# Patient Record
Sex: Female | Born: 1935 | Race: White | Hispanic: No | Smoking: Never smoker
Health system: Southern US, Community
[De-identification: ages and names within clinical notes are randomized; demographics above are authoritative.]

## PROBLEM LIST (undated history)

## (undated) DIAGNOSIS — E785 Hyperlipidemia, unspecified: Secondary | ICD-10-CM

## (undated) DIAGNOSIS — R7989 Other specified abnormal findings of blood chemistry: Secondary | ICD-10-CM

## (undated) DIAGNOSIS — I509 Heart failure, unspecified: Secondary | ICD-10-CM

## (undated) DIAGNOSIS — H9190 Unspecified hearing loss, unspecified ear: Secondary | ICD-10-CM

## (undated) DIAGNOSIS — I872 Venous insufficiency (chronic) (peripheral): Secondary | ICD-10-CM

## (undated) DIAGNOSIS — S0003XA Contusion of scalp, initial encounter: Secondary | ICD-10-CM

## (undated) DIAGNOSIS — S1093XA Contusion of unspecified part of neck, initial encounter: Secondary | ICD-10-CM

## (undated) DIAGNOSIS — E039 Hypothyroidism, unspecified: Secondary | ICD-10-CM

## (undated) DIAGNOSIS — I10 Essential (primary) hypertension: Secondary | ICD-10-CM

## (undated) DIAGNOSIS — R350 Frequency of micturition: Secondary | ICD-10-CM

## (undated) DIAGNOSIS — J189 Pneumonia, unspecified organism: Secondary | ICD-10-CM

## (undated) DIAGNOSIS — K5900 Constipation, unspecified: Secondary | ICD-10-CM

## (undated) DIAGNOSIS — K8020 Calculus of gallbladder without cholecystitis without obstruction: Secondary | ICD-10-CM

## (undated) DIAGNOSIS — S0083XA Contusion of other part of head, initial encounter: Secondary | ICD-10-CM

## (undated) DIAGNOSIS — Z9181 History of falling: Secondary | ICD-10-CM

## (undated) DIAGNOSIS — S3210XA Unspecified fracture of sacrum, initial encounter for closed fracture: Secondary | ICD-10-CM

## (undated) DIAGNOSIS — M533 Sacrococcygeal disorders, not elsewhere classified: Secondary | ICD-10-CM

## (undated) DIAGNOSIS — S81809A Unspecified open wound, unspecified lower leg, initial encounter: Secondary | ICD-10-CM

## (undated) DIAGNOSIS — J302 Other seasonal allergic rhinitis: Secondary | ICD-10-CM

## (undated) DIAGNOSIS — Z8673 Personal history of transient ischemic attack (TIA), and cerebral infarction without residual deficits: Secondary | ICD-10-CM

## (undated) DIAGNOSIS — J449 Chronic obstructive pulmonary disease, unspecified: Secondary | ICD-10-CM

## (undated) DIAGNOSIS — R739 Hyperglycemia, unspecified: Secondary | ICD-10-CM

## (undated) DIAGNOSIS — I00 Rheumatic fever without heart involvement: Secondary | ICD-10-CM

## (undated) DIAGNOSIS — M2550 Pain in unspecified joint: Secondary | ICD-10-CM

## (undated) DIAGNOSIS — N179 Acute kidney failure, unspecified: Secondary | ICD-10-CM

## (undated) DIAGNOSIS — R609 Edema, unspecified: Secondary | ICD-10-CM

## (undated) DIAGNOSIS — M1990 Unspecified osteoarthritis, unspecified site: Secondary | ICD-10-CM

## (undated) DIAGNOSIS — M25579 Pain in unspecified ankle and joints of unspecified foot: Secondary | ICD-10-CM

## (undated) DIAGNOSIS — G609 Hereditary and idiopathic neuropathy, unspecified: Secondary | ICD-10-CM

## (undated) DIAGNOSIS — S91009A Unspecified open wound, unspecified ankle, initial encounter: Secondary | ICD-10-CM

## (undated) DIAGNOSIS — I739 Peripheral vascular disease, unspecified: Secondary | ICD-10-CM

## (undated) DIAGNOSIS — S322XXA Fracture of coccyx, initial encounter for closed fracture: Secondary | ICD-10-CM

## (undated) DIAGNOSIS — S60219A Contusion of unspecified wrist, initial encounter: Secondary | ICD-10-CM

## (undated) DIAGNOSIS — I4901 Ventricular fibrillation: Secondary | ICD-10-CM

## (undated) DIAGNOSIS — I669 Occlusion and stenosis of unspecified cerebral artery: Secondary | ICD-10-CM

## (undated) DIAGNOSIS — Z8542 Personal history of malignant neoplasm of other parts of uterus: Secondary | ICD-10-CM

## (undated) DIAGNOSIS — R945 Abnormal results of liver function studies: Secondary | ICD-10-CM

## (undated) DIAGNOSIS — S81009A Unspecified open wound, unspecified knee, initial encounter: Secondary | ICD-10-CM

## (undated) DIAGNOSIS — G4700 Insomnia, unspecified: Secondary | ICD-10-CM

## (undated) DIAGNOSIS — B3749 Other urogenital candidiasis: Secondary | ICD-10-CM

## (undated) HISTORY — DX: Contusion of unspecified part of neck, initial encounter: S10.93XA

## (undated) HISTORY — DX: Other urogenital candidiasis: B37.49

## (undated) HISTORY — DX: Contusion of scalp, initial encounter: S00.03XA

## (undated) HISTORY — DX: Unspecified open wound, unspecified knee, initial encounter: S81.009A

## (undated) HISTORY — DX: Unspecified hearing loss, unspecified ear: H91.90

## (undated) HISTORY — DX: Unspecified fracture of sacrum, initial encounter for closed fracture: S32.10XA

## (undated) HISTORY — DX: Frequency of micturition: R35.0

## (undated) HISTORY — DX: Hereditary and idiopathic neuropathy, unspecified: G60.9

## (undated) HISTORY — DX: Contusion of unspecified wrist, initial encounter: S60.219A

## (undated) HISTORY — DX: Pain in unspecified joint: M25.50

## (undated) HISTORY — DX: Hyperglycemia, unspecified: R73.9

## (undated) HISTORY — DX: Occlusion and stenosis of unspecified cerebral artery: I66.9

## (undated) HISTORY — DX: Pain in unspecified ankle and joints of unspecified foot: M25.579

## (undated) HISTORY — DX: Calculus of gallbladder without cholecystitis without obstruction: K80.20

## (undated) HISTORY — DX: Rheumatic fever without heart involvement: I00

## (undated) HISTORY — DX: Hyperlipidemia, unspecified: E78.5

## (undated) HISTORY — DX: Edema, unspecified: R60.9

## (undated) HISTORY — DX: Other specified abnormal findings of blood chemistry: R79.89

## (undated) HISTORY — DX: Insomnia, unspecified: G47.00

## (undated) HISTORY — DX: Other seasonal allergic rhinitis: J30.2

## (undated) HISTORY — PX: CATARACT EXTRACTION W/ INTRAOCULAR LENS  IMPLANT, BILATERAL: SHX1307

## (undated) HISTORY — DX: Unspecified open wound, unspecified lower leg, initial encounter: S81.809A

## (undated) HISTORY — DX: Unspecified open wound, unspecified ankle, initial encounter: S91.009A

## (undated) HISTORY — DX: History of falling: Z91.81

## (undated) HISTORY — DX: Ventricular fibrillation: I49.01

## (undated) HISTORY — DX: Contusion of other part of head, initial encounter: S00.83XA

## (undated) HISTORY — DX: Peripheral vascular disease, unspecified: I73.9

## (undated) HISTORY — DX: Abnormal results of liver function studies: R94.5

## (undated) HISTORY — DX: Constipation, unspecified: K59.00

## (undated) HISTORY — DX: Acute kidney failure, unspecified: N17.9

## (undated) HISTORY — DX: Sacrococcygeal disorders, not elsewhere classified: M53.3

## (undated) HISTORY — DX: Venous insufficiency (chronic) (peripheral): I87.2

## (undated) HISTORY — DX: Personal history of malignant neoplasm of other parts of uterus: Z85.42

## (undated) HISTORY — DX: Fracture of coccyx, initial encounter for closed fracture: S32.2XXA

## (undated) HISTORY — DX: Pneumonia, unspecified organism: J18.9

## (undated) HISTORY — DX: Chronic obstructive pulmonary disease, unspecified: J44.9

---
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
## 1958-12-13 HISTORY — PX: THYROIDECTOMY: SHX17

## 1988-12-13 HISTORY — PX: ABDOMINAL HYSTERECTOMY: SHX81

## 1999-03-18 ENCOUNTER — Ambulatory Visit (HOSPITAL_COMMUNITY): Admission: RE | Admit: 1999-03-18 | Discharge: 1999-03-18 | Payer: Self-pay

## 2000-04-27 ENCOUNTER — Encounter: Payer: Self-pay | Admitting: Family Medicine

## 2000-04-27 ENCOUNTER — Ambulatory Visit (HOSPITAL_COMMUNITY): Admission: RE | Admit: 2000-04-27 | Discharge: 2000-04-27 | Payer: Self-pay | Admitting: Family Medicine

## 2000-12-16 ENCOUNTER — Ambulatory Visit (HOSPITAL_COMMUNITY): Admission: RE | Admit: 2000-12-16 | Discharge: 2000-12-16 | Payer: Self-pay | Admitting: Gastroenterology

## 2001-02-07 ENCOUNTER — Other Ambulatory Visit: Admission: RE | Admit: 2001-02-07 | Discharge: 2001-02-07 | Payer: Self-pay | Admitting: Family Medicine

## 2001-07-27 ENCOUNTER — Ambulatory Visit (HOSPITAL_COMMUNITY): Admission: RE | Admit: 2001-07-27 | Discharge: 2001-07-27 | Payer: Self-pay | Admitting: Family Medicine

## 2001-07-27 ENCOUNTER — Encounter: Payer: Self-pay | Admitting: Family Medicine

## 2002-02-27 ENCOUNTER — Other Ambulatory Visit: Admission: RE | Admit: 2002-02-27 | Discharge: 2002-02-27 | Payer: Self-pay | Admitting: Family Medicine

## 2002-03-01 ENCOUNTER — Encounter: Payer: Self-pay | Admitting: Family Medicine

## 2002-03-01 ENCOUNTER — Encounter: Admission: RE | Admit: 2002-03-01 | Discharge: 2002-03-01 | Payer: Self-pay | Admitting: Family Medicine

## 2002-07-30 ENCOUNTER — Ambulatory Visit (HOSPITAL_COMMUNITY): Admission: RE | Admit: 2002-07-30 | Discharge: 2002-07-30 | Payer: Self-pay | Admitting: Family Medicine

## 2002-07-30 ENCOUNTER — Encounter: Payer: Self-pay | Admitting: Family Medicine

## 2003-05-21 ENCOUNTER — Other Ambulatory Visit: Admission: RE | Admit: 2003-05-21 | Discharge: 2003-05-21 | Payer: Self-pay | Admitting: Family Medicine

## 2003-08-01 ENCOUNTER — Encounter: Payer: Self-pay | Admitting: Family Medicine

## 2003-08-01 ENCOUNTER — Ambulatory Visit (HOSPITAL_COMMUNITY): Admission: RE | Admit: 2003-08-01 | Discharge: 2003-08-01 | Payer: Self-pay | Admitting: Family Medicine

## 2004-04-23 ENCOUNTER — Other Ambulatory Visit: Admission: RE | Admit: 2004-04-23 | Discharge: 2004-04-23 | Payer: Self-pay | Admitting: Family Medicine

## 2004-04-27 ENCOUNTER — Ambulatory Visit (HOSPITAL_COMMUNITY): Admission: RE | Admit: 2004-04-27 | Discharge: 2004-04-27 | Payer: Self-pay | Admitting: Obstetrics and Gynecology

## 2004-05-19 ENCOUNTER — Ambulatory Visit: Admission: RE | Admit: 2004-05-19 | Discharge: 2004-05-19 | Payer: Self-pay | Admitting: Gynecology

## 2004-06-23 ENCOUNTER — Encounter (INDEPENDENT_AMBULATORY_CARE_PROVIDER_SITE_OTHER): Payer: Self-pay | Admitting: Specialist

## 2004-06-23 ENCOUNTER — Encounter (INDEPENDENT_AMBULATORY_CARE_PROVIDER_SITE_OTHER): Payer: Self-pay | Admitting: *Deleted

## 2004-06-23 ENCOUNTER — Inpatient Hospital Stay (HOSPITAL_COMMUNITY): Admission: EM | Admit: 2004-06-23 | Discharge: 2004-06-25 | Payer: Self-pay | Admitting: Obstetrics and Gynecology

## 2004-07-29 ENCOUNTER — Ambulatory Visit: Admission: RE | Admit: 2004-07-29 | Discharge: 2004-07-29 | Payer: Self-pay | Admitting: Gynecology

## 2004-08-07 ENCOUNTER — Ambulatory Visit (HOSPITAL_COMMUNITY): Admission: RE | Admit: 2004-08-07 | Discharge: 2004-08-07 | Payer: Self-pay | Admitting: Gynecology

## 2004-11-18 ENCOUNTER — Other Ambulatory Visit: Admission: RE | Admit: 2004-11-18 | Discharge: 2004-11-18 | Payer: Self-pay | Admitting: *Deleted

## 2005-01-23 DIAGNOSIS — J189 Pneumonia, unspecified organism: Secondary | ICD-10-CM

## 2005-01-23 HISTORY — DX: Pneumonia, unspecified organism: J18.9

## 2005-03-24 ENCOUNTER — Other Ambulatory Visit: Admission: RE | Admit: 2005-03-24 | Discharge: 2005-03-24 | Payer: Self-pay | Admitting: *Deleted

## 2005-05-23 DIAGNOSIS — I669 Occlusion and stenosis of unspecified cerebral artery: Secondary | ICD-10-CM

## 2005-05-23 HISTORY — DX: Occlusion and stenosis of unspecified cerebral artery: I66.9

## 2005-06-04 ENCOUNTER — Inpatient Hospital Stay (HOSPITAL_COMMUNITY): Admission: EM | Admit: 2005-06-04 | Discharge: 2005-06-08 | Payer: Self-pay | Admitting: Emergency Medicine

## 2005-06-07 ENCOUNTER — Encounter (INDEPENDENT_AMBULATORY_CARE_PROVIDER_SITE_OTHER): Payer: Self-pay | Admitting: *Deleted

## 2005-07-27 ENCOUNTER — Other Ambulatory Visit: Admission: RE | Admit: 2005-07-27 | Discharge: 2005-07-27 | Payer: Self-pay | Admitting: *Deleted

## 2005-08-06 ENCOUNTER — Ambulatory Visit (HOSPITAL_COMMUNITY): Admission: RE | Admit: 2005-08-06 | Discharge: 2005-08-06 | Payer: Self-pay | Admitting: *Deleted

## 2005-12-02 ENCOUNTER — Other Ambulatory Visit: Admission: RE | Admit: 2005-12-02 | Discharge: 2005-12-02 | Payer: Self-pay | Admitting: Obstetrics and Gynecology

## 2006-06-03 ENCOUNTER — Other Ambulatory Visit: Admission: RE | Admit: 2006-06-03 | Discharge: 2006-06-03 | Payer: Self-pay | Admitting: Obstetrics & Gynecology

## 2006-08-09 ENCOUNTER — Ambulatory Visit (HOSPITAL_COMMUNITY): Admission: RE | Admit: 2006-08-09 | Discharge: 2006-08-09 | Payer: Self-pay | Admitting: *Deleted

## 2006-11-24 ENCOUNTER — Other Ambulatory Visit: Admission: RE | Admit: 2006-11-24 | Discharge: 2006-11-24 | Payer: Self-pay | Admitting: Obstetrics & Gynecology

## 2007-01-19 ENCOUNTER — Inpatient Hospital Stay (HOSPITAL_COMMUNITY): Admission: EM | Admit: 2007-01-19 | Discharge: 2007-01-24 | Payer: Self-pay | Admitting: Emergency Medicine

## 2007-01-27 ENCOUNTER — Inpatient Hospital Stay (HOSPITAL_COMMUNITY): Admission: EM | Admit: 2007-01-27 | Discharge: 2007-02-03 | Payer: Self-pay | Admitting: Emergency Medicine

## 2007-01-30 ENCOUNTER — Encounter: Payer: Self-pay | Admitting: Cardiology

## 2007-07-11 ENCOUNTER — Other Ambulatory Visit: Admission: RE | Admit: 2007-07-11 | Discharge: 2007-07-11 | Payer: Self-pay | Admitting: Obstetrics & Gynecology

## 2007-08-21 ENCOUNTER — Ambulatory Visit (HOSPITAL_COMMUNITY): Admission: RE | Admit: 2007-08-21 | Discharge: 2007-08-21 | Payer: Self-pay | Admitting: Family Medicine

## 2008-02-27 ENCOUNTER — Other Ambulatory Visit: Admission: RE | Admit: 2008-02-27 | Discharge: 2008-02-27 | Payer: Self-pay | Admitting: Obstetrics and Gynecology

## 2008-08-23 ENCOUNTER — Ambulatory Visit (HOSPITAL_COMMUNITY): Admission: RE | Admit: 2008-08-23 | Discharge: 2008-08-23 | Payer: Self-pay | Admitting: Family Medicine

## 2008-09-05 ENCOUNTER — Other Ambulatory Visit: Admission: RE | Admit: 2008-09-05 | Discharge: 2008-09-05 | Payer: Self-pay | Admitting: Obstetrics & Gynecology

## 2009-09-19 ENCOUNTER — Ambulatory Visit (HOSPITAL_COMMUNITY): Admission: RE | Admit: 2009-09-19 | Discharge: 2009-09-19 | Payer: Self-pay | Admitting: Family Medicine

## 2010-01-06 ENCOUNTER — Encounter: Admission: RE | Admit: 2010-01-06 | Discharge: 2010-02-13 | Payer: Self-pay | Admitting: Family Medicine

## 2011-04-30 NOTE — Discharge Summary (Signed)
[name], [name]             ACCOUNT NO.:  [id number]   MEDICAL RECORD NO.:  [id number]          PATIENT TYPE:  INP   LOCATION:  1426                         FACILITY:  [facility]   PHYSICIAN:  Kela Millin, M.D.DATE OF BIRTH:  1910-12-15   DATE OF ADMISSION:  06/04/2005  DATE OF DISCHARGE:                                 DISCHARGE SUMMARY   DISCHARGE DIAGNOSES:  1.  Embolic stroke, multifocal.  2.  Hypertension.  3.  Hypercholesterolemia.  4.  History of endometrial carcinoma, stage I, status post hysterectomy.  5.  Hypothyroidism.   STUDIES:  1.  CT scan of the head:  No acute intracranial abnormalities.  2.  MRI of the brain:  Multiple small bilateral acute embolic infarcts.      Negative for metastatic disease.   CONSULTATIONS:  Neurology:  Dr. Vickey Huger.   HISTORY:  The patient is a 77 year old white female resident of [facility], who presented status post a fall.  The patient reported  that she had been in the shower and fell.  She was unable to give details of  what happened.  She denied losing consciousness or having any presyncopal-  type episodes.  The patient's granddaughter reported that the patient had  complaints of dizziness for about two days prior to presentation and also  had more difficulty seeing while reading, which had gradually increased.  The patient was noted to be confused after the fall.  She reported a  headache but stated at the time she was seen that it had improved.   PHYSICAL EXAMINATION:  VITAL SIGNS:  Upon admission per Dr. Jomarie Longs,  temperature 96.8, blood pressure 222/118 with a pulse of 84.  Respiratory  rate 20.  O2 sat 98%.  Her repeat blood pressure was 126/66.  HEENT:  She had ecchymosis around her left eye and a small laceration above  the left eye as well, which had been sutured up in the ER.  Mucous membranes  were moist.  The rest of the patient's physical exam was noted to be within  normal limits.   On the  laboratory data, the patient had a white cell count of 10 with a  hemoglobin of 14, hematocrit 43, platelet count 211.  Sodium 137, potassium  3.6, chloride 101, bicarb 26, glucose 102, BUN 23, creatinine 1, calcium 9.  Urinalysis was negative for infection.  Chronic enzymes were negative.   HOSPITAL COURSE:  Problem 1:  Embolic stroke, multifocal:  Upon admission,  the patient had a CT scan of the head which showed no acute intracranial  abnormalities, as noted above.  She had a follow-up MRI, and it showed  multiple small bilateral acute infarcts, as already stated above.  Following  these findings, a 2D echo was ordered as well as carotid Doppler ultrasound.  The carotid Doppler ultrasound showed no ICA stenosis bilaterally.  The 2D  echo results are pending at the time of this dictation.  Neurology was  consulted, and Dr. Vickey Huger saw the patient.  She was started on Aggrenox 1  tablet p.o. b.i.d.  Physical therapy was also consulted  while she was in the  hospital.  Home health physical therapy was recommended for safety  evaluation.  The patient has remained hemodynamically stable throughout her  hospital stay.  She is to follow up with her primary care physician, Dr.  Henrine Screws, and with neurology as needed.  Problem 2:  Hypertension:  The patient's initial blood pressure in the  emergency room was noted to be markedly elevated, 222/118, but subsequently  on recheck, it was 126/66.  During the hospital stay, her blood pressures  were closely monitored, and were noted to be persistently elevated.  So, the  patient was started on lisinopril for blood pressure control.  Problem 3:  Hyperthyroidism:  The patient was maintained on Synthroid during  her hospital stay.  Problem 4:  Hypercholesterolemia:  The patient was maintained on her  antilipid medications during her hospital stay.   DISCHARGE MEDICATIONS:  1.  Aggrenox 1 p.o. b.i.d.  2.  Lisinopril 10 mg 1 p.o. daily.  3.   Patient is to continue preadmission medications.   FOLLOW-UP CARE:  1.  Dr. Henrine Screws in one week.  2.  Neurology as needed.   DISCHARGE CONDITION:  Improved, stable.       ACV/MEDQ  D:  06/07/2005  T:  06/07/2005  Job:  [id number]   cc:   Chales Salmon. Abigail Miyamoto, M.D.  [address]  [place]  [place] [address]  Fax: [phone]   Melvyn Novas, M.D.  [address]  [address]  [place]  [place] [address]  Fax: [phone]

## 2011-04-30 NOTE — H&P (Signed)
NAMEWLADYSLAWA, DISBRO             ACCOUNT NO.:  [id number]   MEDICAL RECORD NO.:  [id number]          PATIENT TYPE:  INP   LOCATION:  0101                         FACILITY:  [facility]   PHYSICIAN:  Theone Stanley, MD   DATE OF BIRTH:  08-18-1911   DATE OF ADMISSION:  06/04/2005  DATE OF DISCHARGE:                                HISTORY & PHYSICAL   CHIEF COMPLAINT:  Fall.   HISTORY OF PRESENT ILLNESS:  Mrs. Hands is a 77 year old female who lives  in [facility] who was in the shower and fell.  She is unable  to give me the details of it; however, she denies losing consciousness or  having any presyncopal-type episodes.  Her granddaughter did note that she  has had complaints of dizziness over the past 2 days and a day ago mentioned  somewhat difficulty seeing while she was reading which has been a gradual  increase.  It appears patient is currently confused after fall.  She does  have a headache but is better than her initial presentation.   PAST MEDICAL HISTORY:  Hypothyroidism and history of uterine CA,  hypercholesterolemia.   MEDICATIONS:  Aspirin 81 mg p.o. once daily, Provera 10 mg one p.o. b.i.d.,  Lovastatin 20 mg one p.o. at bedtime, Synthroid 88 mcg one p.o. once daily.   ALLERGIES:  None.   SURGERY:  Hysterectomy.   FAMILY HISTORY:  Noncontributory to current patient situation.   SOCIAL HISTORY:  Patient lives in [place] in [facility]  with her husband.  No tobacco, alcohol or illicit drug use.   REVIEW OF SYSTEMS:  Please see HPI otherwise systems were negative.   PHYSICAL EXAMINATION:  VITAL SIGNS:  Temperature of 96.8, blood pressure  222/118, pulse of 84, respiratory rate of 20, saturating 97% on room air.  Repeat blood pressure was 126/66.  HEENT:  There is evidence of ecchymosis around her left eye and she had a  small laceration above her left eye which was sutured up by the ER  physician.  There is no previous  bleeding.  Pupils are equal, reactive,  extraocular movements intact.  Ears no discharge.  Throat clear.  No  erythema noted or exudate.  Mucosa moist.  NECK:  Supple.  No lymphadenopathy, no JVD.  HEART:  Regular rate and rhythm.  No murmurs, rubs or gallops heard.  LUNGS:  Clear to auscultation bilaterally.  ABDOMEN:  Soft, nontender, nondistended.  EXTREMITIES:  No edema, cyanosis or clubbing.   LABORATORIES/RADIOLOGY:  Patient had a CT of the head and spine which did  not show anything except changes consistent with her age.  White count of  10, hemoglobin 14, hematocrit 43, platelets at 211.  Sodium of 137,  potassium of 3.6, chloride at 101, bicarb of 26, glucose at 102, BUN at 23,  creatinine at 1, calcium at 9.  UA was negative.  Set of cardiac enzymes  were negative.   ASSESSMENT AND PLAN:  [age]-year-old female with minimal medical  problems presenting with a fall.   Problem 1. FALL.  This is most likely secondary to  mechanical; however,  patient remains confused.  In addition she apparently was having some  strange behavior prior to this incident.  CT scan was negative.  I will  obtain an MRI and admit her for observation for a 23-hour stay to observe  her.  If patient improves over the next 23 hours, she can be discharged at  that point in time.  Problem 2. HISTORY OF HYPOTHYROIDISM.  Patient indicated that she was taking  her Synthroid twice a day now, this could be secondary to post-trauma  confusion; however, I will check a TSH.  Problem 3. LOVASTATIN.  We will continue this.  Problem 4. HISTORY OF ENDOMETRIAL CARCINOMA STAGE I STATUS POST  HYSTERECTOMY.  I will continue on her Provera.       AEJ/MEDQ  D:  06/04/2005  T:  06/04/2005  Job:  [id number]   cc:   Chales Salmon. Abigail Miyamoto, M.D.  [address]  [place]  [place] [address]  Fax: [phone]

## 2011-04-30 NOTE — Consult Note (Signed)
NAMEBURNIE, HANK NO.:  [id number]   MEDICAL RECORD NO.:  [id number]          PATIENT TYPE:  INP   LOCATION:  1426                         [facility]AC[facility]   PHYSICIAN:  Melvyn Novas, M.D.  DATE OF BIRTH:  Nov 07, 1911   DATE OF CONSULTATION:  DATE OF DISCHARGE:                                   CONSULTATION   [name] is a 77 year old patient of Dr. Jasmine Pang of the [facility] team. She was born on 1911-12-09. The patient was admitted  on June 04, 2005 after a fall. She lives at [name] and is a 89-year-  old pleasantly confused Caucasian widowed female, a retired Runner, broadcasting/film/video states  that she has no idea what happened at the time of the fall. She has no  recollection of it but she does state that she has felt confused since then.  According to the hospitalist admission note an H&P, the had already acted  different from her normal self for days prior to the fall.   The patient has a history of osteoporosis, hypothyroidism, rheumatic fever  as a child. 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 She has also a history of endometrial carcinoma.   MEDICATIONS:  See the H&P of Dr. Jomarie Longs. It mentions aspirin, Provera,  lovastatin and Synthroid.   CONCERN:  This patient fell and was confused for over 48 hours since. An MRI  was obtained and showed no subdural hemorrhage or concussion related  injuries but multifocal small most likely embolic strokes in both  hemispheres of the brain. I could detect on defusion weighted image five  lesions on the left and three on the right. The embolic origin is truly the  most likely given this distribution.   MENTAL STATUS:  The patient is alert, she has shortterm memory impairment  but fluent speech and repetition. She can  follow multistep commands. As we  discussed, the possibility that her strokes originate from a heart valve  condition. She told me of her history of rheumatic fever. She is well able  to combine medical information and appears very well educated and fully  oriented to time and place.   CRANIAL NERVE EXAMINATION:  Shows the pupils to be reactive to light and  accommodation. The patient has a large area of bruising over the left eye  and temple. She has no resulting ptosis and no ciliary injections. There is  no papilledema noticed. She has no facial asymmetry for the lower branch and  she chose a normal tongue and uvula movement. Eye movements to the left of  the visual field seem to provoke a nystagmus and psychotic eye movements  that I do not see when she tries to look to the right.   MOTOR EXAMINATION:  Shows normal 5/5 grip strength, elbow flexion, shoulder  shrug, hip adduction, abduction, flexion and dorsiflexion and plantar  flexion of  the ankle. Down going toes and 1+ deep tendon reflexes  throughout.   SENSORY:  Intact to vibration, temperature and pinprick. The patient also  shows no tendency to extinct.   COORDINATION AND GAIT:  The patient could walk to the bathroom unassisted  according to her RN. She is weaker on the right leg but she does not drift.  She states that he right leg is bruised and that she has had trouble with it  before. She does not need an assistive device.   ASSESSMENT:  Embolic stroke multifocal. It is conceivable that one of these  embolic lesions might have caused the temporary loss of consciousness and  fall that was described by her nursing home.   PLAN:  I think a transthoracic echocardiogram should be sufficient for this  very slender lady. A transcranial Doppler could be contemplated. She should  resume aspirin at full strength. I do not know if she currently took 81 or  325 mg at home and I suggested that she be placed on DVT prophylaxis  while  hospitalized. I would also like to obtain a homocystine level and a  cholesterol fasting lipid profile and involve her in physical therapy. I do  not think that speech therapy or occupational therapy are necessary. Therapy  should mainly direct her to having better balance and gait stabilization.       CD/MEDQ  D:  06/06/2005  T:  06/07/2005  Job:  [id number]   cc:   Theone Stanley, MD   Chales Salmon. Abigail Miyamoto, M.D.  [address]  [place]  [place] [address]  Fax: [phone]   Pramod P. Pearlean Brownie, MD  Fax: [phone]

## 2011-04-30 NOTE — H&P (Signed)
[name], [name]             ACCOUNT NO.:  [id number]   MEDICAL RECORD NO.:  [id number]          PATIENT TYPE:  INP   LOCATION:  2631                         FACILITY:  [facility]   PHYSICIAN:  Kela Millin, M.D.DATE OF BIRTH:  03/30/1911   DATE OF ADMISSION:  01/27/2007  DATE OF DISCHARGE:                              HISTORY & PHYSICAL   Her primary care physician is [name]. [name], M.D.   CHIEF COMPLAINT:  Decreased p.o. intake and falls.   HISTORY OF PRESENT ILLNESS:  The patient is a 77 year old white female  recently discharged from the hospital following treatment for pneumonia  with hypotension.  Other past medical history significant for  hypertension, hyperlipidemia, hypothyroidism, history of embolic stroke  in June 2006, who presents with the above complaints.  The family is at  the bedside assisting with the history.  They report that the patient  has had decreased p.o. intake since she was discharged from the hospital  about 3 days ago.  They also report that the patient has been falling at  home.  EMS reports that the patient was seen at [facility] and blood pressure  found to be 84/82.  He was given a 500 mL bolus of fluids and the blood  pressures improved to a systolic of 130, and initial O2 saturations by  EMS 84%, that improved to 90% on nasal cannula O2.  The patient's  daughter also reports that the patient has had diarrhea although the  patient is unable to state about how often or further describe her  stools.  She denies nausea, vomiting, fevers, dysuria, cough, and no  chest pain.   In the ER the patient's blood pressure was noted to be 88/60.  Lab work  revealed a BUN of 66 with a creatinine of 3.8 and her white cell count  elevated at 13.4.  On her chest x-ray the AP film showed an obscured  left hemidiaphragm, question left lower lobe pneumonia, but this was not  seen on the lateral view of the x-ray.  The patient is admitted to the  [facility] service for further evaluation and management.   PAST MEDICAL HISTORY:  1. As stated above.  2. History of endometrial cancer.  3. Arthritis.  4. Hyponatremia, diagnosed during her recent hospital stay.   MEDICATIONS:  1. Aggrenox one p.o. b.i.d.  2. Avelox (she was to take this for 5 more days).  3. Lisinopril 10 mg daily.  4. Lovastatin 10 mg daily.  5. Medroxyprogesterone 10 mg b.i.d.  6. Mucinex.  7. Synthroid 75 mcg daily.  8. Tylenol p.r.n.  9. Albuterol two puffs q.4h. p.r.n.   ALLERGIES:  ERYTHROMYCIN.   SOCIAL HISTORY:  No tobacco and no alcohol.   FAMILY HISTORY:  Her brother had diabetes and niece with liver cancer.   REVIEW OF SYSTEMS:  As per HPI.   PHYSICAL EXAMINATION:  GENERAL:  The patient is an elderly white female.  She is somnolent but easily aroused and oriented x3, in no respiratory  distress.  VITAL SIGNS:  Her lowest blood pressure in the ER 82/50, current  blood  pressure following fluid bolus 90/50, pulse 85, respiratory rate 20, O2  saturation of 96%.  HEENT:  Dry mucous membranes, PERRL, EOMI, sclerae anicteric.  No oral  exudates.  NECK:  Supple, no adenopathy, no JVD, and no thyromegaly.  LUNGS:  Decreased breath sounds at the bases, no crackles and no  wheezes.  CARDIOVASCULAR:  A regular rate and rhythm, normal S1, S2, no gallops.  ABDOMEN:  Soft, bowel sounds present, nontender, nondistended, no  organomegaly, and no masses palpable.  EXTREMITIES:  No cyanosis and no edema.  Right wrist tenderness.  NEUROLOGIC:  Somnolent, easily arousable, oriented x3.  Cranial nerves  II-XII grossly intact.  Nonfocal exam.  SKIN:  Macular erythematous rash on upper extremities and trunk.   LABORATORY DATA:  CT scan of head:  No acute findings, chronic white  matter ischemic changes and status post right mastoidectomy.  C-spine  with advanced degenerative changes and spondylosis, no fracture.  Chest  x-ray AP view:  Left hemidiaphragm  obscured, question left lower lobe  air space disease, but not confirmed on the lateral view.  Sodium is 134  with a potassium of 4.3, chloride 109, BUN is 66, glucose 87.  A pH of  7.40 and her PCO2 29.1, bicarb 18.3.  Her creatinine 3.8 (on February 7  the patient's BUN was 24 with a creatinine of 1.32).  The patient's  white cell count is 13.4 with a hemoglobin of 11.8, hematocrit 34.3,  platelet count 160, neutrophil count 88.   ASSESSMENT AND PLAN:  1. Hypotension.  Infection versus hypovolemia, rule out cardiac      etiology.  Hydrate with IV fluids and consider vasopressors as      appropriate.  We will obtain blood cultures, UA and cultures, chest      x-ray as above with question of pneumonia.  We will start the      patient on empiric antibiotics and follow.  Will also obtain serial      cardiac enzymes, EKG, and monitor H&H.  I will also obtain stool      cultures and C. difficile and follow.  2. Question diarrhea.  As discussed above, obtain stool studies,      supportive care, and follow.  3. Acute renal failure, secondary to above, also likely a component of      acute tubular necrosis with the hypotension.  Hydrate, follow, and      recheck.  4. Leukocytosis.  As discussed above, culture, empiric antibiotics,      and follow.  5. Hypothyroidism.  Will continue Synthroid.  6. Falls, likely secondary to above.  CT scan of head and neck      negative for acute changes.  Will obtain a wrist x-ray and follow      (patient with wrist tenderness on exam), follow, and consult PT      when stabilized.  7. Rash, question drug-related.  The patient was placed on Avelox at      the time of discharge about 3 days ago.  Will hold off Avelox,      monitor, and treat as appropriate.      Kela Millin, M.D.  Electronically Signed    ACV/MEDQ  D:  01/28/2007  T:  01/28/2007  Job:  [id number]   cc:   [name]. [name], M.D.

## 2011-04-30 NOTE — H&P (Signed)
[name], [name]             ACCOUNT NO.:  [id number]   MEDICAL RECORD NO.:  [id number]          PATIENT TYPE:  INP   LOCATION:  4705                         FACILITY:  [facility]   PHYSICIAN:  Corinna L. Lendell Caprice, MDDATE OF BIRTH:  02/27/1911   DATE OF ADMISSION:  01/19/2007  DATE OF DISCHARGE:                              HISTORY & PHYSICAL   CHIEF COMPLAINT:  Could not wake up.   HISTORY OF PRESENT ILLNESS:  [name] is a pleasant highly functional  77 year old white female who has been treated as an outpatient with  Avelox for pneumonia since about 8 days ago.  She has had a cough  productive of yellow sputum, poor appetite, frontal headache with sinus  congestion.  She had a follow-up visit several days after she was  started on Avelox and Mucinex and felt better but was feeling poorly  today.  In the office she was found to have an oxygen saturation of 81%  and a systolic blood pressure of 78%.  Apparently she nodded off during  the examination but was able to be aroused.  This is very unusual for  her because she lives independently at [name] with her husband and  is quite sharp mentally.  She has had no vomiting or diarrhea, no rash,  no muscle aches.  She was also noted to have a TSH of 40 in the office  on January 11, 2007 and is on Synthroid.  Apparently her dose has not  been changed recently.  She is feeling better currently.  She came via  ambulance and was placed on oxygen and has had a liter of normal saline.   PAST MEDICAL HISTORY:  1. Presumed embolic stroke in June 2006, no source of embolus was      found apparently.  2. Hypothyroidism.  3. History of endometrial carcinoma.  4. Hypertension.  5. Arthritis.  6. Hyperlipidemia.   MEDICATIONS:  1. Avelox.  2. Mucinex.  3. Aggrenox b.i.d.  4. Lisinopril 10 mg a day.  5. Lovastatin 10 mg a day.  6. Medroxyprogesterone 10 mg twice a day.  7. Synthroid 50 mcg a day.  8. Tylenol.   SOCIAL HISTORY:   The patient is married and here with her husband.  No  drinking or smoking history.   FAMILY HISTORY:  She reports that her grandfather, uncle and cousin had  diabetes.   REVIEW OF SYSTEMS:  As above, otherwise negative.   PHYSICAL EXAMINATION:  VITAL SIGNS:  Her blood pressure in the emergency  room is 99/58, temperature 98.4, pulse 88, respiratory rate 18, oxygen  saturation 88% on room air, 95% on 2 liters nasal cannula.  GENERAL:  The patient is a thin white female in no acute distress.  HEENT:  Normocephalic, atraumatic.  Pupils equal, round, reactive to  light.  Sclerae nonicteric.  Moist mucous membranes.  Neck is supple.  No lymphadenopathy.  LUNGS:  She has mild rales on the left mid lung field.  No wheeze or  rhonchi.  CARDIOVASCULAR:  Regular rate and rhythm without murmurs, gallops, or  rubs.  ABDOMEN:  Normal  bowel sounds, soft, nontender, nondistended.  GU AND RECTAL:  Deferred.  EXTREMITIES:  No clubbing, cyanosis, or edema.  SKIN:  No rash.  PSYCHIATRIC:  Normal affect.  NEUROLOGIC:  Alert and oriented.  Cranial nerves and sensorimotor exam  are intact.   LABORATORIES:  CBC is unremarkable.  Sodium 131, potassium 5.1, chloride  105, bicarbonate 20, glucose 105, BUN 24, creatinine 1.32, albumin is  3.0, otherwise essentially normal liver function tests.  Chest x-ray  done in the emergency room (PA and lateral) shows a lingular infiltrate.  Chest x-ray from the office is unavailable but according to office notes  on January 11, 2007 showed infiltrate at the left base.   ASSESSMENT/PLAN:  1. Pneumonia, failed outpatient Avelox.  The patient will be admitted      to telemetry.  We have done blood cultures.  I will get a sputum      Gram stain and culture and give Zosyn and vancomycin intravenous.  2. Hypotension, resolved secondary to dehydration.  3. Hypoxia, improved.  She will be on supplemental oxygen and      nebulizers as needed, although she is not wheezing  currently.  4. Hypothyroidism with an elevated TSH in the office.  I will increase      her Synthroid to 75 mcg and she will need a recheck in a few      months.  5. History of embolic stroke.  Continue Aggrenox.  6. Hypertension.  Hold lisinopril.  7. Dehydration and prerenal azotemia.  8. Acute sinusitis.  The patient does have pain over her frontal      sinuses and I will add Afrin to her regimen as well.      Corinna L. Lendell Caprice, MD  Electronically Signed     CLS/MEDQ  D:  01/19/2007  T:  01/20/2007  Job:  [id number]   cc:   Chales Salmon. Abigail Miyamoto, M.D.

## 2011-04-30 NOTE — Consult Note (Signed)
[name], [name] NO.:  [id number]   MEDICAL RECORD NO.:  [id number]          PATIENT TYPE:  INP   LOCATION:  2631                         FACILITY:  [facility]   PHYSICIAN:  Cassell Clement, M.D. DATE OF BIRTH:  07-14-1911   DATE OF CONSULTATION:  01/28/2007  DATE OF DISCHARGE:                                 CONSULTATION   HISTORY:  This is a 77 year old married Caucasian female who was  readmitted on January 27, 2007 because of weakness, falling spells at  home, and poor oral intake.  She had also had questionable diarrhea.  She had been seen in her primary care office yesterday and was found to  be weak, have low blood pressure, have elevated BUN and creatinine, and  was sent back to the hospital for readmission. She had been discharged  after hospitalization from January 19, 2007 through January 24, 2007,  for treatment of pneumonia. The patient denies any chest pain but the  cardiac enzymes have been elevated on admission and trending down.  Her  EKG on admission showed atrial fibrillation but EKG today shows normal  sinus rhythm. This morning the patient had several bursts of monomorphic  ventricular tachycardia while on dopamine drip which resolved  spontaneously.  Subsequently her dopamine drip has been reduced.  The  patient's last echocardiogram was in 2006 and at that time she was found  to have normal systolic function.   PAST MEDICAL HISTORY:  Includes a history of essential hypertension,  hyperlipidemia, hypothyroidism, a history of previous embolic stroke in  June 2006.  The patient also has a history of arthritis and endometrial  cancer.  On her previous hospital stay she had hyponatremia.   MEDICATIONS ON ADMISSION:  1. Aggrenox one twice a day.  2. Lisinopril 10 mg daily.  3. Lovastatin 10 mg daily.  4. Medroxyprogesterone 10 mg b.i.d.  5. Mucinex daily.  6. Synthroid 75 mcg daily.  7. Tylenol p.r.n.  8. Albuterol 2 puffs every 4 hours  p.r.n.   She had also recently been on Avelox and is now found to have a skin  rash.   ALLERGIES:  Allergy to ERYTHROMYCIN.   PHYSICAL EXAM:  VITAL SIGNS:  Her blood pressure is 85/34 on IV fluids,  the pulse is 104 and sinus tachycardia with frequent PVCs.  GENERAL:  This is a somewhat lethargic Caucasian female in no acute  distress.  The skin is warm and dry. She does have a generalized macular  rash on her body.  CHEST:  Reveals rales at the left base.  The carotids reveal no bruits.  HEART:  Reveals grade 2/6 systolic ejection murmur at the base.  There  is no pericardial rub.  No S3 gallop.  ABDOMEN:  Nontender.  EXTREMITIES:  No phlebitis or edema.  I cannot feel any definite pedal  pulses.   Her chest x-ray shows possible left lower lobe infiltrate.  It does not  suggest congestive failure. The EKG yesterday showed no acute ischemic  changes and she did show atrial fibrillation at that time.   LABORATORY DATA:  Initial labs include white  count of 12,000.  CK-MB  were 5.1, 4.6 and 3.3 with troponins of 0.35, 0.28 and 0.16.  Electrolytes showed potassium of 4.0, blood sugar 115, BUN 63,  creatinine 2.7.  Serum albumin is low at 2.3. Urinalysis is cloudy,  nitrite negative. Thyroid function studies and beta natruretic peptide  are pending.   IMPRESSION:  1. Hypotension and sinus tachycardia probably secondary to dehydration      and residual pneumonia.  2. Elevated cardiac enzymes, probably secondary to arrhythmia and she      was noted to be in atrial fibrillation on admission and has had      documented hypotension, which could well have caused some poor      coronary perfusion and lead to cardiac enzyme elevation.  3. Cannot exclude pulmonary embolism as a cause of the tachycardia and      hypotension given the fact that she was recently immobilized with      pneumonia.  However, we are not able to give her contrast at this      time because of her elevated BUN and  creatinine and she is already      on full-dose empiric Lovenox which is appropriate.  4. Ventricular arrhythmia uncertain etiology.   RECOMMENDATIONS:  Agree with present therapy including Lovenox, aspirin,  IV fluid resuscitation, low-dose dopamine for blood pressure support,  follow electrolytes closely.  We will get a two-dimensional  echocardiogram Monday.  We will follow with you.           ______________________________  Cassell Clement, M.D.     TB/MEDQ  D:  01/28/2007  T:  01/28/2007  Job:  [id number]   cc:   Kela Millin, M.D.  Chales Salmon. Abigail Miyamoto, M.D.

## 2011-04-30 NOTE — Consult Note (Signed)
[name], LOVERIDGE NO.:  [id number]   MEDICAL RECORD NO.:  [id number]           PATIENT TYPE:   LOCATION:                                 FACILITY:   PHYSICIAN:  De Blanch, M.D. DATE OF BIRTH:   DATE OF CONSULTATION:  09/01/2004  DATE OF DISCHARGE:                                   CONSULTATION   CONSULTATION:  The patient has now had a CT scan which shows no evidence of  metastatic disease.  We also tested her tumor for estrogen and progesterone  receptor content which are both strongly positive.   I contacted Mrs. Riss by phone to discuss these results as well as  develop further treatment plan.  Of the 2 active treatment options which I  think are reasonable (radiation therapy or hormone therapy) the patient  prefers to be treated with hormones.  We will therefore mail her a  prescription for Provera 10 mg b.i.d. to be taken for the next 2 years.  She  will follow up with Dr. Myrlene Broker for surveillance visits.     Dani   DC/MEDQ  D:  09/01/2004  T:  09/02/2004  Job:  [id number]   cc:   Laqueta Linden, M.D.  [address]  [place]  [place] [address]  Fax: [phone]   Telford Nab, R.N.  [address]  [place], [place] [address]

## 2011-04-30 NOTE — Op Note (Signed)
NAME:  [name], [name]                       ACCOUNT NO.:  [id number]   MEDICAL RECORD NO.:  [id number]                   PATIENT TYPE:  INP   LOCATION:  9399                                 FACILITY:  [facility]   PHYSICIAN:  Laqueta Linden, M.D.                 DATE OF BIRTH:  04-19-1911   DATE OF PROCEDURE:  06/23/2004  DATE OF DISCHARGE:                                 OPERATIVE REPORT   PREOPERATIVE DIAGNOSIS:  Grade I adenocarcinoma of the endometrium.   POSTOPERATIVE DIAGNOSIS:  Grade I adenocarcinoma of the endometrium.   PROCEDURE:  Total abdominal hysterectomy with bilateral salpingo-  oophorectomy, cytologic washings.   SURGEON:  Laqueta Linden, M.D.   ASSISTANT:  Andres Ege, M.D.   ANESTHESIA:  Epidural/spinal, failed.  LMA converted to fiberoptic  intubation through a nasal RAE and general endotracheal anesthesia.   ESTIMATED BLOOD LOSS:  150 mL.   URINE OUTPUT:  250 mL.   FLUIDS REPLACED:  1900 mL Crystalloid.   SPECIMENS:  Uterus, tubes and ovaries and cytologic washings sent to  pathology.   Counts correct x2.   COMPLICATIONS:  None other than difficult anesthesia.   INDICATIONS FOR PROCEDURE:  [name] is a 77 year old menopausal  female who presented with post menopausal bleeding.  Endometrial biopsy  revealed a grade I endometrial adenocarcinoma.  She consulted with De Blanch, M.D., who is at [facility] Department of Gynecology/Oncology,  regarding management alternatives, particularly due to her age.  Alternatives including simple hysterectomy versus progesterone treatment  were discussed with the patient who elected to proceed to surgical  management.  Surgical staging including lymphadenectomy will not be  performed as the patient is not a candidate for adjuvant radiation therapy  at her age and it is felt unlikely that she will have an advanced degree of  cancer.  These recommendations were submitted by Dr. Stanford Breed.  The  patient was in agreement and she presents now for total abdominal  hysterectomy with bilateral salpingo-oophorectomy and cytologic washings.  She and her husband have seen the informed consent film, voiced her  understanding and acceptance of all risks, benefits, alternatives, and  complications including, but not limited to, anesthesia risks, infection,  bleeding possibly requiring transfusion, injury to bowel, bladder, ureters,  vessels and nerves, possibility of fistula formation, possibility of DVT,  PE, pneumonia and other unknown risks including death all of which are  increased due to her advanced age.  It is felt that the patient is in  excellent health given her age with functional category I to II cardiac and  lung function and is a good candidate for anesthesia.  Regional anesthesia  was initially elected to avoid general anesthesia if possible.  Full consent  has been given. She has been given a dose of Ancef IV antibiotic prophylaxis  preoperatively.   DESCRIPTION OF PROCEDURE:  The patient was taken to the operating room and  after proper identification and consents are ascertained, she is placed on  the operating table.  Dr. Tacy Dura initially tried placement of an epidural,  obtaining a wet tap and subsequently converting this to a spinal; however,  adequate level of anesthesia was not obtained with the spinal despite  multiple manipulations.  At this point, an LMA was placed and the incision  was started.  The CRNA in the room then felt that an LMA was not adequate  airway protection in this situation and the procedure was then temporarily  halted after the skin incision was made, for Dr. Arby Barrette to come in and  convert this to endotracheal intubation by using a fiberoptic scope and a  nasal RAE.  Once an adequate airway and ventilation had been established,  the operation preceded.  A transverse incision was made and carried down to  the fascia which was incised in a  routine fashion.  Parietal peritoneum was  incised in a routine fashion and the incision extended.  There was no  significant peritoneal fluid.  Peritoneal washings were taken and sent in  heparinized solution.  Care was taken not to overly palpate or traumatize  the patient's tissues.  The renal contours were felt to be smooth.  There  were no obvious lesions on the peritoneal surfaces, spleen tip or liver  edge.  There were no obvious gallstones.  The appendix was not visualized.  The pelvis was notable for a tiny uterus with a tiny right tube and ovary.  The left tube did appear to be partially removed from the patient's prior  salpingectomy for an ectopic pregnancy with some adhesions of the proximal  left tube and ovary into the left ovarian fossa.  Both ovaries appeared  completely tiny and atrophic.  There was a 1 cm adherent nodule on the  rectosigmoid felt to be consistent with a diverticulum.  There were no  obvious peritoneal nodules, adhesions, tumor or other abnormalities  identified.  Self-retaining retractor was placed and the uterus elevated  into the operating fields.  The round ligament were clamped, cut, and suture  ligated with dissection carried forward and posteriorly in the broad  ligament.  Infundibulopelvic ligaments were isolated bilaterally, clamped,  cut and doubly ligated with free tie and a stitch of 0 Vicryl after freeing  up the tubal ovarian adhesions on the left and mobilizing that ovary.  Both  ureters were visualized, noted to be normal course and caliber and pulsating  normally prior to this part of the procedure as well as at the conclusion of  the procedure.  The uterine vessels were then skeletonized bilaterally.  Curved Heaney clamps placed perpendicularly across the vessels at the level  of the internal os.  Pedicles were cut, suture ligated with 0 Vicryl suture. Successive straight clamps were placed across the cardinal ligaments  bilaterally with  pedicles cut and suture ligated, such that the first  pedicle incorporated the uterine vessels such that they were doubly ligated.  Several small bleeding points were cauterized.  At this point, the upper  vaginal angles were reached and the Satinsky scissors were used to  circumscribe the cervix with removal of the specimen which was sent in its  entirety for final section and to pathology along with the cytologic  washings.  Uterine angle sutures were placed at both angles.  The remainder  of the vaginal cuff was closed with simple figure-of-eight suture of 0  Vicryl.  Several small bleeding points were cauterized.  Hemostasis was  noted to be excellent.  All pedicles were hemostatic.  Copious lavage was  then accomplished.  The retractor and the lap pads were then removed.  Sponge and instrument counts correct prior to closure of the abdomen.  The  parietal peritoneum was closed in running fashion using 2-0 Vicryl sutures.  The rectus muscles were loosely reapproximated in the midline.  Subfascial  hemostasis was ascertained and the fascia was closed from both lateral  aspects to the midline using running sutures of 0 Maxon which were tied in  the midline.  Subcutaneous hemostasis was ascertained.  The skin was closed  with staples and Steri-Strips and pressure dressings were applied.  The  patient was stable and awake and extubated on transfer to the PACU.                                               Laqueta Linden, M.D.   LKS/MEDQ  D:  06/23/2004  T:  06/23/2004  Job:  [id number]   cc:   Illa Level, M.D.  [place] [employer], technical sales at [employer]

## 2011-04-30 NOTE — Discharge Summary (Signed)
[name], [name]             ACCOUNT NO.:  [id number]   MEDICAL RECORD NO.:  [id number]          PATIENT TYPE:  INP   LOCATION:  5712                         FACILITY:  [facility]   PHYSICIAN:  Michelene Gardener, MD    DATE OF BIRTH:  1911-02-08   DATE OF ADMISSION:  30-Jan-2007  DATE OF DISCHARGE:                               DISCHARGE SUMMARY   DISCHARGE DIAGNOSES:  1. Pneumonia.  2. Hyponatremia.  3. Hypertension.  4. Hyperlipidemia.  5. Hypothyroidism.  6. History of endometrium carcinoma.  7. Arthritis.  8. History of embolic stroke in June2007.   DISCHARGE MEDICATIONS:  1. Avelox 400 mg p.o. once daily for 5 days.  2. Aggrenox 1 tablet p.o. twice daily.  3. Synthroid 75 mcg p.o. once daily.  4. Afrin 2 sprays twice daily.  5. Zocor 20 mg p.o. at bedtime.  6. Albuterol 2 puffs q.4 h. as needed.  7. Lisinopril 10 mg p.o. once daily.  8. Progesterone 10 mg twice daily.  9. Tylenol p.r.n.   CONSULTATIONS:  None.   PROCEDURES:  None.   RADIOLOGIC STUDIES:  1. Chest x-ray done in 01/30/2023 showed patchy lingular pneumonia.  2. Repeat x-ray done in February10 showed right base atelectasis      with improving lingular aeration, resolving infiltrates.   FOLLOW-UP APPOINTMENT:  With Dr. Henrine Screws in 1-2 weeks.   CONDITION AT TIME OF DISCHARGE:  The patient is stable.   REASON FOR HOSPITALIZATION:  This is a 77 year old white female who was  diagnosed with pneumonia as an outpatient and was given Avelox for 8  days, but she continued to have cough and productive sputum.   REASON FOR COURSE OF HOSPITALIZATION BY MEDICAL PROBLEM:  1. Pneumonia as mentioned above on the time of admission.  Chest x-ray      was showing lingular infiltrate.  The patient was admitted to the      hospital, was started on Zosyn.  He started initially on oxygen as      needed, and that was tapered off as her saturation improved.  The      patient was also put in inhaler treatments as  needed.  Cultures      were sent for blood and sputum.  All her cultures came to be      negative.  Will be sent home on 5 more days of p.o. Lovenox, and      was given inhaler treatments as needed.  2. Hyponatremia.  This is most likely secondary to [facility] from the      infection.  I am expecting this to resolve her infection result.  I      recommend repeat her basic metabolic panel as an outpatient.  3. Hypertension.  Her blood pressure remained stable during the      hospitalization, same medication were continued.  4. Hyperlipidemia.  The patient was switched to Zocor 20 mg p.o. at      bedtime.  5. Hypothyroidism.  The patient was continued on Synthroid, and she      was recommended to repeat her TSH as an  outpatient,  around 3      months.   DISPOSITION:  Otherwise, the patient remained stable during the  hospitalization, and other medications were continued. Will be followed  by Dr. Henrine Screws in around 1 to 2 weeks.      Michelene Gardener, MD  Electronically Signed     NAE/MEDQ  D:  01/24/2007  T:  01/24/2007  Job:  [id number]   cc:   Chales Salmon. Abigail Miyamoto, M.D.

## 2011-04-30 NOTE — Consult Note (Signed)
NAME:  [name], [name]                       ACCOUNT NO.:  [id number]   MEDICAL RECORD NO.:  [id number]                   PATIENT TYPE:  OUT   LOCATION:  GYN                                  FACILITY:  [facility]   PHYSICIAN:  De Blanch, M.D.         DATE OF BIRTH:  21-Jul-1911   DATE OF CONSULTATION:  05/19/2004  DATE OF DISCHARGE:                                   CONSULTATION   REASON FOR CONSULTATION:  A 77 year old white female seen in consultation at  the request of Dr. Myrlene Broker regarding management of a grade 1 endometrial  cancer.  The patient presented with some postmenopausal bleeding and  underwent an endometrial biopsy showing a grade 1 endometrial cancer.  She  has not been on any hormone replacement therapy.  She denies any pelvic  pain, pressure, or any constitutional symptoms.   PAST MEDICAL HISTORY:   MEDICAL ILLNESSES:  None.  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   PAST SURGICAL HISTORY:  1. Exploratory laparotomy resection of a tube and ovary perceived to have     been a ruptured ectopic pregnancy in 1952.  2. A thyroidectomy in 1967.   DRUG ALLERGIES:  AMOXICILLIN.   FAMILY HISTORY:  Negative for gynecologic, breast, or colon cancers.   OBSTETRICAL HISTORY:  Gravida 2.   CURRENT MEDICATIONS:  Synthroid, Fosamax, Arthrotec, lovastatin, baby  aspirin, glucosamine calcium.   REVIEW OF SYSTEMS:  Negative except as noted above.   SOCIAL HISTORY:  The patient is married.  She comes accompanied by her  daughter-in-law and husband.  The patient is a retired Psychologist, forensic.   PHYSICAL EXAMINATION:  VITAL SIGNS:  Weight 139 pounds, height 5 feet 2  inches, blood pressure 132/82.  GENERAL:  The patient is a healthy, elderly white female in no acute  distress.  HEENT:  Negative.  NECK:  Supple without thyromegaly.  LYMPHATICS:  There is no  supraclavicular or inguinal adenopathy.  ABDOMEN:  Soft, nontender.  No masses, organomegaly, ascites, or hernias are  noted.  PELVIC:  EGBUS, vagina, bladder, urethra are normal.  Vagina is atrophic,  cervix is atrophic.  There is some blood coming from the cervical os.  Uterus is anterior and normal in shape, size, and consistency.  There are no  adnexal masses noted.  Rectovaginal exam confirms.   IMPRESSION:  Clinical stage I, grade 1 endometrial carcinoma in an elderly  white female who is remarkably healthy.  I discussed with the patient, her  husband, and daughter-in-law management options.  Overall, I would recommend  total abdominal hysterectomy and bilateral salpingo-oophorectomy and pelvic  washings.  We did talk about the alternatives of using either radiation  therapy or Megace.  After discussing the pros and cons of each, the patient  wishes to proceed with surgery.   I have contacted Dr. Katrinka Blazing and  she will arrange to go ahead and proceed with  a TAHBSO and peritoneal washings to hopefully adequately manage the  patient's cancer.                                               De Blanch, M.D.    DC/MEDQ  D:  05/19/2004  T:  05/20/2004  Job:  [id number]   cc:   Laqueta Linden, M.D.  [address]  [place]  [place] [address]  Fax: [phone]   Illa Level   Telford Nab, R.N.  [address]  [place], [place] [address]

## 2011-04-30 NOTE — Consult Note (Signed)
NAME:  [name], [name]                       ACCOUNT NO.:  [id number]   MEDICAL RECORD NO.:  [id number]                   PATIENT TYPE:  OUT   LOCATION:  GYN                                  FACILITY:  [facility]   PHYSICIAN:  De Blanch, M.D.         DATE OF BIRTH:  Jun 05, 1911   DATE OF CONSULTATION:  07/29/2004  DATE OF DISCHARGE:                                   CONSULTATION   REASON FOR CONSULTATION:  A 77 year old female seen in reconsultation at the  request of Dr. Myrlene Broker.  The patient underwent total abdominal  hysterectomy, bilateral salpingo-oophorectomy on June 23, 2004. Final  pathology of her specimen showed a grade 2 endometrial carcinoma which was  deeply invasive (1.2 cm out of 1.5 cm).  Cervix, adnexa and washings were  negative (stage IC).  The patient's had an uncomplicated postoperative  course.   Past medical history, past surgical history, family history, social history  are reviewed and unchanged from my initial consultation note of May 19, 2004.   IMPRESSION:  Stage IC grade 2 endometrial adenocarcinoma.  I had a lengthy  discussion with the patient regarding these findings. She would definitely  fall into an intermediate risk group where it would be reasonable to offer  her adjuvant therapy such as whole pelvis radiation therapy.  I discussed  the treatment options with her which include:   1. Observation alone. The patient is informed that she may well already be     cured of her cancer by the surgery that has been performed.  2. Whole pelvis radiation therapy. It appears there is evidence that this     would reduce the risk of recurrence in the pelvis although the overall     value of improving overall survival is uncertain.  3. Consideration of using high dose progestins.   PLAN:  Before making a final decision, we will obtain a CT scan of the  abdomen and pelvis to make certain there is no evidence of occult metastatic  disease. We will  also ask the pathology department to evaluate her primary  tumor for estrogen and progesterone receptors and as this may guide the use  of progestin therapy. Once these reports are back, we will contact the  patient and have her make a further decision as to what approach she would  like to take.                                               De Blanch, M.D.    DC/MEDQ  D:  07/29/2004  T:  07/30/2004  Job:  [id number]   cc:   Laqueta Linden, M.D.  [address]  [place]  [place] [address]  Fax: [phone]

## 2011-04-30 NOTE — Discharge Summary (Addendum)
NAMECRYSTALLE, [name]             ACCOUNT NO.:  [id number]   MEDICAL RECORD NO.:  [id number]          PATIENT TYPE:  INP   LOCATION:  2631                         FACILITY:  [facility]   PHYSICIAN:  Hollice Espy, M.D.DATE OF BIRTH:  January 21, 1911   DATE OF ADMISSION:  01/27/2007  DATE OF DISCHARGE:  02/03/2007                               DISCHARGE SUMMARY   PCP:  Dr. Henrine Screws.   DISCHARGE DIAGNOSES:  1. Left lower lobe pneumonia.  2. Chronic diastolic congestive heart failure with an ejection      fraction of 45% documented by echocardiogram, stable.  3. Acute renal failure, resolved.  4. Hypotension secondary to acute renal failure, resolved.  5. Drug rash secondary to fluoroquinolones, resolved.  6. Hypothyroidism.  7. History of endometrial cancer.  8. History of hyponatremia.   DISCHARGE MEDICATIONS:  1. Aggrenox one capsule p.o. b.i.d.  2. Lisinopril 10 mg p.o. daily.  3. Lovastatin 10 p.o. daily.  4. Medroxyprogesterone 10 mg p.o. b.i.d.  5. Synthroid 75 mcg p.o. daily.  6. Albuterol 2 puffs q.4h. p.r.n. inhaler.  7. Afrin spray nasally, 2 sprays b.i.d.  8. Lasix 20 mg p.o. daily.   HOSPITAL COURSE:  The patient is a 77 year old white female with past  medical history of hyponatremia, endometrial CA, CAD and hypothyroidism  who was recently discharged from the hospital following treatment for  pneumonia with hypotension, she was discharged on Avelox.  Ever since  discharge, she had been discharged back to skilled nursing and had  decreased p.o. intake and that she had been having increased falls.  She  was seen at [facility] and found to have a blood pressure of 84  systolic.  The patient was given a 500 mL fluid bolus and her systolic  improved to 130.  However, O2 sats initially were 84% and improved to  90% on O2.  Patient was brought back in.  She had a questionable left  lower pneumonia not seen on lateral view of x-ray and observed left  hemidiaphragm.  The patient was brought in.  Her creatinine was noted to  be 3.8.  Her previous creatinine a little over a week earlier had been  1.32.  Her white count was also noted to be 13.4.  Patient was admitted  for acute renal failure, hypotension and suspected recurrence of  pneumonia.  She was noted to have a rash which was suspected to be  secondary to Avelox which was a new medication for her.  This was  discontinued.  By hospital day 2, patient was doing better.  She had  cardiac enzymes ordered which were noted to be peaked at troponin of  0.35.  This was felt to be questionably secondary to her renal failure.  She also required dopamine initially, as despite IV fluid boluses her  blood pressure remained low.  By hospital day 2 ____ QA MARKER: 284  ____Dr. Patty Sermons was consulted for evaluation.  Patient's A  discontinued as it was caused felt because of her drug reaction and she  was put on IV Zosyn.  Dr. Yevonne Pax assessment of the patient that the  cardiac ____ QA MARKER: 304 ____enzyme  hypotension causing coronary perfusion.  He put her on full dose Lovenox  to empirically treat her to assume that in case that she had a possibly  pulmonary embolus, in which a scan could not be checked, to go ahead and  increase her renal function.  A 2D echo was ordered.  By hospital day 3,  patient was doing much better, her white count was down to 6.6, her  creatinine was down to 1.4, dopamine was further weaned down and her  blood pressure was up to 109.  Chest x-ray by February 18 showed  improved left lower lobe density, no evidence of any CHF and her  dopamine was able to be completely weaned off.  Cultures remained all  negative and her white count was normalized.  A 2D echo was done on  February 18 which showed some mild decrease in her ejection fraction,  some diastolic dysfunction and EF of 45%.  Patient continued to do well.   At this point, she continued to receive antibiotics, remained afebrile.  A CT angio of the chest was able to be done once her creatinine had  normalized and it was completely unremarkable for PE.  Her drug rash had  resolved as well on Zosyn.  PT, OT was available to evaluate the patient  and again continued to recommend skilled nursing.  By February 20,  patient was doing better, her blood pressure had initially increased and  her medications have been resumed.  Her acute renal failure and a chest  x-ray showed resolution of her pneumonia.  She completed a full 7-day  course of antibiotics and her medical issues were felt to be stable by  February 03, 2007.  At this time a skilled nursing bed has been found to  be available.  Plans will be that patient will follow up with Dr.  Abigail Miyamoto in the next 1-2 weeks.  In terms of new medications, she will  continue all of her previous antihypertensives.  We will also add low  dose Lasix 20 mg p.o. daily to the patient's regimen to account for some  of her mild chronic diastolic dysfunction.  She is already on an ACE  inhibitor.  The patient's overall disposition improved.   ACTIVITY:  Will be as per skilled nursing.   DISCHARGE DIET:  A heart healthy diet.   She is being discharged to skilled nursing.      Hollice Espy, M.D.  Electronically Signed     SKK/MEDQ  D:  02/03/2007  T:  02/03/2007  Job:  [id number]   cc:   Chales Salmon. Abigail Miyamoto, M.D.

## 2011-04-30 NOTE — Procedures (Signed)
[facility]  Patient:    [name], [name]                    MRN: [id number] Proc. Date: 12/16/00 Adm. Date:  [id number] Attending:  Louie Bun CC:         Chales Salmon. Abigail Miyamoto, M.D.   Procedure Report  PROCEDURE:  Colonoscopy.  INDICATION FOR PROCEDURE:  Heme-positive stools.  DESCRIPTION OF PROCEDURE:  The patient was placed in the left lateral decubitus position and placed on the pulse monitor with continuous low-flow oxygen delivered by nasal cannula.  She was sedated with 30 mg IV Demerol and 2.5 mg IV Versed.  The Olympus video colonoscope was inserted into the rectum and advanced to the cecum, confirmed by transillumination at McBurneys point and visualization of the ileocecal valve and appendiceal orifice.  The prep was excellent.  The cecum, ascending, transverse, descending, and sigmoid colon all appeared normal with no masses, polyps, diverticula, or other mucosal abnormalities.  The rectum likewise appeared normal, and retroflex view of the anus revealed small internal hemorrhoids.  The colonoscope was then withdrawn and the patient returned to the recovery room in stable condition.  She tolerated the procedure well, and there were no immediate complications.  IMPRESSION:  Internal hemorrhoids, otherwise normal colonoscopy. DD:  12/16/00 TD:  12/16/00 Job: [id number]

## 2012-01-24 DIAGNOSIS — N179 Acute kidney failure, unspecified: Secondary | ICD-10-CM

## 2012-01-24 DIAGNOSIS — I4901 Ventricular fibrillation: Secondary | ICD-10-CM

## 2012-01-24 HISTORY — DX: Acute kidney failure, unspecified: N17.9

## 2012-01-24 HISTORY — DX: Ventricular fibrillation: I49.01

## 2012-01-27 DIAGNOSIS — Z9181 History of falling: Secondary | ICD-10-CM

## 2012-01-27 DIAGNOSIS — G609 Hereditary and idiopathic neuropathy, unspecified: Secondary | ICD-10-CM

## 2012-01-27 DIAGNOSIS — I739 Peripheral vascular disease, unspecified: Secondary | ICD-10-CM

## 2012-01-27 DIAGNOSIS — M533 Sacrococcygeal disorders, not elsewhere classified: Secondary | ICD-10-CM

## 2012-01-27 HISTORY — DX: Sacrococcygeal disorders, not elsewhere classified: M53.3

## 2012-01-27 HISTORY — DX: History of falling: Z91.81

## 2012-01-27 HISTORY — DX: Peripheral vascular disease, unspecified: I73.9

## 2012-01-27 HISTORY — DX: Hereditary and idiopathic neuropathy, unspecified: G60.9

## 2012-02-03 ENCOUNTER — Other Ambulatory Visit: Payer: Self-pay | Admitting: Internal Medicine

## 2012-02-03 DIAGNOSIS — R102 Pelvic and perineal pain: Secondary | ICD-10-CM

## 2012-02-03 DIAGNOSIS — M545 Low back pain: Secondary | ICD-10-CM

## 2012-02-04 ENCOUNTER — Ambulatory Visit
Admission: RE | Admit: 2012-02-04 | Discharge: 2012-02-04 | Disposition: A | Discharge status: Home / Self Care | Payer: Self-pay | Source: Ambulatory Visit | Attending: Internal Medicine | Admitting: Internal Medicine

## 2012-02-04 DIAGNOSIS — R102 Pelvic and perineal pain: Secondary | ICD-10-CM

## 2012-02-04 DIAGNOSIS — M545 Low back pain: Secondary | ICD-10-CM

## 2012-02-14 DIAGNOSIS — S3210XA Unspecified fracture of sacrum, initial encounter for closed fracture: Secondary | ICD-10-CM

## 2012-02-14 HISTORY — DX: Unspecified fracture of sacrum, initial encounter for closed fracture: S32.10XA

## 2012-04-20 DIAGNOSIS — R609 Edema, unspecified: Secondary | ICD-10-CM

## 2012-04-20 DIAGNOSIS — S81009A Unspecified open wound, unspecified knee, initial encounter: Secondary | ICD-10-CM

## 2012-04-20 HISTORY — DX: Unspecified open wound, unspecified knee, initial encounter: S81.009A

## 2012-04-20 HISTORY — DX: Edema, unspecified: R60.9

## 2012-06-01 DIAGNOSIS — S0003XA Contusion of scalp, initial encounter: Secondary | ICD-10-CM

## 2012-06-01 DIAGNOSIS — S1093XA Contusion of unspecified part of neck, initial encounter: Secondary | ICD-10-CM

## 2012-06-01 DIAGNOSIS — E785 Hyperlipidemia, unspecified: Secondary | ICD-10-CM

## 2012-06-01 DIAGNOSIS — S60219A Contusion of unspecified wrist, initial encounter: Secondary | ICD-10-CM

## 2012-06-01 HISTORY — DX: Contusion of unspecified wrist, initial encounter: S60.219A

## 2012-06-01 HISTORY — DX: Contusion of scalp, initial encounter: S10.93XA

## 2012-06-01 HISTORY — DX: Hyperlipidemia, unspecified: E78.5

## 2012-06-01 HISTORY — DX: Contusion of scalp, initial encounter: S00.03XA

## 2012-06-14 DIAGNOSIS — M25579 Pain in unspecified ankle and joints of unspecified foot: Secondary | ICD-10-CM

## 2012-06-14 HISTORY — DX: Pain in unspecified ankle and joints of unspecified foot: M25.579

## 2012-06-16 ENCOUNTER — Other Ambulatory Visit: Payer: Self-pay | Admitting: Internal Medicine

## 2012-06-16 DIAGNOSIS — S0990XA Unspecified injury of head, initial encounter: Secondary | ICD-10-CM

## 2012-06-19 ENCOUNTER — Ambulatory Visit
Admission: RE | Admit: 2012-06-19 | Discharge: 2012-06-19 | Disposition: A | Discharge status: Home / Self Care | Payer: Medicare Other | Source: Ambulatory Visit | Attending: Internal Medicine | Admitting: Internal Medicine

## 2012-06-19 DIAGNOSIS — S0990XA Unspecified injury of head, initial encounter: Secondary | ICD-10-CM

## 2012-08-10 DIAGNOSIS — B3749 Other urogenital candidiasis: Secondary | ICD-10-CM

## 2012-08-10 DIAGNOSIS — G4700 Insomnia, unspecified: Secondary | ICD-10-CM

## 2012-08-10 HISTORY — DX: Insomnia, unspecified: G47.00

## 2012-08-10 HISTORY — DX: Other urogenital candidiasis: B37.49

## 2012-11-02 DIAGNOSIS — K5900 Constipation, unspecified: Secondary | ICD-10-CM

## 2012-11-02 HISTORY — DX: Constipation, unspecified: K59.00

## 2013-01-19 ENCOUNTER — Inpatient Hospital Stay (HOSPITAL_COMMUNITY)
Admission: EM | Admit: 2013-01-19 | Discharge: 2013-01-22 | DRG: 193 | Disposition: A | Discharge status: Skilled Nursing Facility | Payer: Medicare PPO | Attending: Internal Medicine | Admitting: Internal Medicine

## 2013-01-19 ENCOUNTER — Emergency Department (HOSPITAL_COMMUNITY): Payer: Medicare PPO

## 2013-01-19 ENCOUNTER — Encounter (HOSPITAL_COMMUNITY): Payer: Self-pay | Admitting: Emergency Medicine

## 2013-01-19 DIAGNOSIS — E039 Hypothyroidism, unspecified: Secondary | ICD-10-CM

## 2013-01-19 DIAGNOSIS — J189 Pneumonia, unspecified organism: Principal | ICD-10-CM | POA: Diagnosis present

## 2013-01-19 DIAGNOSIS — J9801 Acute bronchospasm: Secondary | ICD-10-CM | POA: Diagnosis present

## 2013-01-19 DIAGNOSIS — Z66 Do not resuscitate: Secondary | ICD-10-CM | POA: Diagnosis present

## 2013-01-19 DIAGNOSIS — Z6825 Body mass index (BMI) 25.0-25.9, adult: Secondary | ICD-10-CM

## 2013-01-19 DIAGNOSIS — I10 Essential (primary) hypertension: Secondary | ICD-10-CM | POA: Diagnosis present

## 2013-01-19 DIAGNOSIS — J9600 Acute respiratory failure, unspecified whether with hypoxia or hypercapnia: Secondary | ICD-10-CM | POA: Diagnosis present

## 2013-01-19 DIAGNOSIS — J449 Chronic obstructive pulmonary disease, unspecified: Secondary | ICD-10-CM

## 2013-01-19 DIAGNOSIS — J441 Chronic obstructive pulmonary disease with (acute) exacerbation: Secondary | ICD-10-CM | POA: Diagnosis present

## 2013-01-19 HISTORY — DX: Personal history of transient ischemic attack (TIA), and cerebral infarction without residual deficits: Z86.73

## 2013-01-19 HISTORY — DX: Heart failure, unspecified: I50.9

## 2013-01-19 HISTORY — DX: Chronic obstructive pulmonary disease, unspecified: J44.9

## 2013-01-19 HISTORY — DX: Unspecified osteoarthritis, unspecified site: M19.90

## 2013-01-19 HISTORY — DX: Essential (primary) hypertension: I10

## 2013-01-19 HISTORY — DX: Hypothyroidism, unspecified: E03.9

## 2013-01-19 LAB — CBC
HCT: 45.1 % (ref 36.0–46.0)
MCHC: 33.7 g/dL (ref 30.0–36.0)
Platelets: 142 10*3/uL — ABNORMAL LOW (ref 150–400)
RDW: 13.3 % (ref 11.5–15.5)
WBC: 7.6 10*3/uL (ref 4.0–10.5)

## 2013-01-19 MED ORDER — ACETAMINOPHEN 325 MG PO TABS
650.0000 mg | ORAL_TABLET | Freq: Once | ORAL | Status: AC
  Administered 2013-01-20: 650 mg via ORAL
  Filled 2013-01-19: qty 1
  Filled 2013-01-19: qty 2

## 2013-01-19 MED ORDER — METHYLPREDNISOLONE SODIUM SUCC 125 MG IJ SOLR
125.0000 mg | Freq: Once | INTRAMUSCULAR | Status: DC
  Filled 2013-01-19: qty 2

## 2013-01-19 MED ORDER — LEVOFLOXACIN IN D5W 750 MG/150ML IV SOLN
750.0000 mg | INTRAVENOUS | Status: DC
  Filled 2013-01-19: qty 150

## 2013-01-19 MED ORDER — ALBUTEROL SULFATE (5 MG/ML) 0.5% IN NEBU
5.0000 mg | INHALATION_SOLUTION | Freq: Once | RESPIRATORY_TRACT | Status: AC
  Administered 2013-01-20: 5 mg via RESPIRATORY_TRACT
  Filled 2013-01-19 (×2): qty 1

## 2013-01-19 NOTE — ED Notes (Signed)
Bed:WA18<BR> Expected date:<BR> Expected time:<BR> Means of arrival:<BR> Comments:<BR> EMS

## 2013-01-19 NOTE — ED Provider Notes (Signed)
History     CSN: [id number]  Arrival date & time 01/19/13  2302   First MD Initiated Contact with Patient 01/19/13 2312      Chief Complaint  Patient presents with  . Shortness of Breath    (Consider location/radiation/quality/duration/timing/severity/associated sxs/prior treatment) HPI Hx per nursing home report, EMS and limited history from PT is DNR from nursing home with worsening SOB, cough and wheezing.  No reported fevers but complains of feeling warm. No new pains - has arthritis, denies CP or new leg swelling. Was started on ABx today at nursing facility after neb and persistent wheezing with borderline hypoxia. Mod to severe SOB worsening today, onset few days ago Past Medical History  Diagnosis Date  . Arthritis   . CHF (congestive heart failure)     History reviewed. No pertinent past surgical history.  No family history on file.  History  Substance Use Topics  . Smoking status: Not on file  . Smokeless tobacco: Not on file  . Alcohol Use: No    OB History    Grav Para Term Preterm Abortions TAB SAB Ect Mult Living                  Review of Systems  Constitutional: Negative for diaphoresis and fatigue.  HENT: Negative for neck pain and neck stiffness.   Eyes: Negative for visual disturbance.  Respiratory: Positive for cough, shortness of breath and wheezing.   Cardiovascular: Positive for leg swelling. Negative for chest pain.  Gastrointestinal: Negative for vomiting and abdominal pain.  Genitourinary: Negative for dysuria.  Musculoskeletal: Negative for back pain.  Skin: Negative for rash.  Neurological: Negative for syncope and weakness.  All other systems reviewed and are negative.    Allergies  Review of patient's allergies indicates not on file.  Home Medications  No current outpatient prescriptions on file.  BP 136/74  Pulse 120  Temp 98.2 F (36.8 C) (Oral)  Resp 20  Ht 5\' 2"  (1.575 m)  Wt 142 lb (64.411 kg)  BMI 25.97 kg/m2   SpO2 91%  Physical Exam  Constitutional: She appears well-developed and well-nourished.  HENT:  Head: Normocephalic and atraumatic.  Eyes: EOM are normal. Pupils are equal, round, and reactive to light.  Neck: Neck supple.  Cardiovascular: Regular rhythm and intact distal pulses.        tachycardic  Pulmonary/Chest: No respiratory distress. She exhibits no tenderness.       Mild tachypnea and exp wheezes bilateral with intermittent cough  Abdominal: Soft. Bowel sounds are normal. She exhibits no distension. There is no tenderness.  Musculoskeletal: Normal range of motion.       1 plus pretibial edema bilat  Neurological: No cranial nerve deficit.       Awake, alert, interactive, hard of hearing  Skin: Skin is warm and dry.    ED Course  Procedures (including critical care time)  Results for orders placed during the hospital encounter of 01/19/13  CBC      Result Value Range   WBC 7.6  4.0 - 10.5 K/uL   RBC 4.87  3.87 - 5.11 MIL/uL   Hemoglobin 15.2 (*) 12.0 - 15.0 g/dL   HCT 56.2  13.0 - 86.5 %   MCV 92.6  78.0 - 100.0 fL   MCH 31.2  26.0 - 34.0 pg   MCHC 33.7  30.0 - 36.0 g/dL   RDW 78.4  69.6 - 29.5 %   Platelets 142 (*) 150 - 400 K/uL  COMPREHENSIVE METABOLIC PANEL      Result Value Range   Sodium 131 (*) 135 - 145 mEq/L   Potassium 3.5  3.5 - 5.1 mEq/L   Chloride 90 (*) 96 - 112 mEq/L   CO2 26  19 - 32 mEq/L   Glucose, Bld 145 (*) 70 - 99 mg/dL   BUN 23  6 - 23 mg/dL   Creatinine, Ser 9.60  0.50 - 1.10 mg/dL   Calcium 9.1  8.4 - 45.4 mg/dL   Total Protein 7.2  6.0 - 8.3 g/dL   Albumin 3.3 (*) 3.5 - 5.2 g/dL   AST 20  0 - 37 U/L   ALT 12  0 - 35 U/L   Alkaline Phosphatase 56  39 - 117 U/L   Total Bilirubin 0.3  0.3 - 1.2 mg/dL   GFR calc non Af Amer 49 (*) >90 mL/min   GFR calc Af Amer 57 (*) >90 mL/min  LACTIC ACID, PLASMA      Result Value Range   Lactic Acid, Venous 4.1 (*) 0.5 - 2.2 mmol/L  PRO B NATRIURETIC PEPTIDE      Result Value Range   Pro B  Natriuretic peptide (BNP) 500.2 (*) 0 - 450 pg/mL  TROPONIN I      Result Value Range   Troponin I <0.30  <0.30 ng/mL  URINALYSIS, ROUTINE W REFLEX MICROSCOPIC      Result Value Range   Color, Urine YELLOW  YELLOW   APPearance CLEAR  CLEAR   Specific Gravity, Urine 1.016  1.005 - 1.030   pH 5.0  5.0 - 8.0   Glucose, UA 100 (*) NEGATIVE mg/dL   Hgb urine dipstick NEGATIVE  NEGATIVE   Bilirubin Urine NEGATIVE  NEGATIVE   Ketones, ur NEGATIVE  NEGATIVE mg/dL   Protein, ur NEGATIVE  NEGATIVE mg/dL   Urobilinogen, UA 0.2  0.0 - 1.0 mg/dL   Nitrite NEGATIVE  NEGATIVE   Leukocytes, UA MODERATE (*) NEGATIVE  URINE MICROSCOPIC-ADD ON      Result Value Range   Squamous Epithelial / LPF FEW (*) RARE   WBC, UA 21-50  <3 WBC/hpf   Bacteria, UA RARE  RARE   Dg Chest Portable 1 View  01/19/2013  *RADIOLOGY REPORT*  Clinical Data: Shortness of breath, CHF  PORTABLE CHEST - 1 VIEW  Comparison: 02/02/2007  Findings: Chronic interstitial markings.  No frank interstitial edema.  Mild patchy left basilar opacity, likely atelectasis.  No pleural effusion or pneumothorax.  The heart is normal in size.  IMPRESSION: Chronic interstitial markings.  No frank interstitial edema.  Left basilar opacity, likely atelectasis.   Original Report Authenticated By: Charline Bills, M.D.     Date: 01/20/2013  Rate: 118  Rhythm: sinus tachycardia  QRS Axis: normal  Intervals: normal  ST/T Wave abnormalities: nonspecific ST/T changes  Conduction Disutrbances:none  Narrative Interpretation:   Old EKG Reviewed: none available    Pulse Ox: Borderline hypoxia RA 91%  CXR, labs, oxygen, ECG, albuterol, steroids and ABx.   IV ABx, Med consult for admit hypoxia/ resp infx  MDM  79 y/o female with cough, SOB, and hypoxia  Plan MED admit for resp infx with new oxygen requirement        Sunnie Nielsen, MD 01/20/13 [phone]

## 2013-01-19 NOTE — ED Notes (Signed)
As per EMS pt c/o SOB/wheezing. Pt is being treated URI, antibiotics today.VSS

## 2013-01-20 ENCOUNTER — Other Ambulatory Visit: Payer: Self-pay

## 2013-01-20 ENCOUNTER — Encounter (HOSPITAL_COMMUNITY): Payer: Self-pay | Admitting: Internal Medicine

## 2013-01-20 DIAGNOSIS — J449 Chronic obstructive pulmonary disease, unspecified: Secondary | ICD-10-CM

## 2013-01-20 DIAGNOSIS — I10 Essential (primary) hypertension: Secondary | ICD-10-CM

## 2013-01-20 DIAGNOSIS — J441 Chronic obstructive pulmonary disease with (acute) exacerbation: Secondary | ICD-10-CM

## 2013-01-20 DIAGNOSIS — E039 Hypothyroidism, unspecified: Secondary | ICD-10-CM

## 2013-01-20 DIAGNOSIS — J9801 Acute bronchospasm: Secondary | ICD-10-CM | POA: Diagnosis present

## 2013-01-20 HISTORY — DX: Chronic obstructive pulmonary disease, unspecified: J44.9

## 2013-01-20 LAB — URINALYSIS, ROUTINE W REFLEX MICROSCOPIC
Glucose, UA: 100 mg/dL — AB
Hgb urine dipstick: NEGATIVE
Protein, ur: NEGATIVE mg/dL
Specific Gravity, Urine: 1.016 (ref 1.005–1.030)
pH: 5 (ref 5.0–8.0)

## 2013-01-20 LAB — COMPREHENSIVE METABOLIC PANEL
ALT: 12 U/L (ref 0–35)
AST: 20 U/L (ref 0–37)
Albumin: 3.3 g/dL — ABNORMAL LOW (ref 3.5–5.2)
Alkaline Phosphatase: 56 U/L (ref 39–117)
BUN: 23 mg/dL (ref 6–23)
Chloride: 90 mEq/L — ABNORMAL LOW (ref 96–112)
Potassium: 3.5 mEq/L (ref 3.5–5.1)
Sodium: 131 mEq/L — ABNORMAL LOW (ref 135–145)
Total Bilirubin: 0.3 mg/dL (ref 0.3–1.2)
Total Protein: 7.2 g/dL (ref 6.0–8.3)

## 2013-01-20 LAB — TSH: TSH: 8.775 u[IU]/mL — ABNORMAL HIGH (ref 0.350–4.500)

## 2013-01-20 LAB — LACTIC ACID, PLASMA
Lactic Acid, Venous: 4.1 mmol/L — ABNORMAL HIGH (ref 0.5–2.2)
Lactic Acid, Venous: 3 mmol/L — ABNORMAL HIGH (ref 0.5–2.2)

## 2013-01-20 LAB — URINE MICROSCOPIC-ADD ON

## 2013-01-20 LAB — STREP PNEUMONIAE URINARY ANTIGEN: Strep Pneumo Urinary Antigen: NEGATIVE

## 2013-01-20 LAB — EXPECTORATED SPUTUM ASSESSMENT W GRAM STAIN, RFLX TO RESP C

## 2013-01-20 MED ORDER — SODIUM CHLORIDE 0.9 % IV SOLN: 250.0000 mL | INTRAVENOUS | Status: DC | PRN

## 2013-01-20 MED ORDER — MORPHINE SULFATE 2 MG/ML IJ SOLN
1.0000 mg | INTRAMUSCULAR | Status: DC | PRN
  Administered 2013-01-20: 1 mg via INTRAVENOUS
  Filled 2013-01-20: qty 1

## 2013-01-20 MED ORDER — SODIUM CHLORIDE 0.9 % IV SOLN
INTRAVENOUS | Status: DC
  Administered 2013-01-20 – 2013-01-21 (×2): via INTRAVENOUS

## 2013-01-20 MED ORDER — ALBUTEROL SULFATE (5 MG/ML) 0.5% IN NEBU: 5.0000 mg | INHALATION_SOLUTION | Freq: Once | RESPIRATORY_TRACT | Status: DC

## 2013-01-20 MED ORDER — ALBUTEROL SULFATE (5 MG/ML) 0.5% IN NEBU
2.5000 mg | INHALATION_SOLUTION | RESPIRATORY_TRACT | Status: DC
  Administered 2013-01-20 (×4): 2.5 mg via RESPIRATORY_TRACT
  Filled 2013-01-20 (×5): qty 0.5

## 2013-01-20 MED ORDER — ACETAMINOPHEN 325 MG PO TABS
650.0000 mg | ORAL_TABLET | Freq: Four times a day (QID) | ORAL | Status: DC | PRN
  Administered 2013-01-20 – 2013-01-22 (×3): 650 mg via ORAL
  Filled 2013-01-20 (×4): qty 2

## 2013-01-20 MED ORDER — CEFEPIME HCL 1 G IJ SOLR
1.0000 g | Freq: Every day | INTRAMUSCULAR | Status: DC
  Administered 2013-01-20 – 2013-01-22 (×3): 1 g via INTRAVENOUS
  Filled 2013-01-20 (×3): qty 1

## 2013-01-20 MED ORDER — HEPARIN SODIUM (PORCINE) 5000 UNIT/ML IJ SOLN
5000.0000 [IU] | Freq: Three times a day (TID) | INTRAMUSCULAR | Status: DC
  Administered 2013-01-20 – 2013-01-22 (×6): 5000 [IU] via SUBCUTANEOUS
  Filled 2013-01-20 (×10): qty 1

## 2013-01-20 MED ORDER — LEVOFLOXACIN IN D5W 500 MG/100ML IV SOLN
500.0000 mg | INTRAVENOUS | Status: DC
  Filled 2013-01-20: qty 100

## 2013-01-20 MED ORDER — SODIUM CHLORIDE 0.9 % IJ SOLN
3.0000 mL | Freq: Two times a day (BID) | INTRAMUSCULAR | Status: DC
  Administered 2013-01-20 – 2013-01-21 (×2): 3 mL via INTRAVENOUS

## 2013-01-20 MED ORDER — ONDANSETRON HCL 4 MG/2ML IJ SOLN: 4.0000 mg | Freq: Four times a day (QID) | INTRAMUSCULAR | Status: DC | PRN

## 2013-01-20 MED ORDER — DOCUSATE SODIUM 100 MG PO CAPS
100.0000 mg | ORAL_CAPSULE | Freq: Two times a day (BID) | ORAL | Status: DC
  Administered 2013-01-20 – 2013-01-21 (×4): 100 mg via ORAL
  Filled 2013-01-20 (×6): qty 1

## 2013-01-20 MED ORDER — VANCOMYCIN HCL 1000 MG IV SOLR
750.0000 mg | Freq: Every day | INTRAVENOUS | Status: DC
  Administered 2013-01-20 – 2013-01-21 (×2): 750 mg via INTRAVENOUS
  Filled 2013-01-20 (×4): qty 750

## 2013-01-20 MED ORDER — ALBUTEROL SULFATE (5 MG/ML) 0.5% IN NEBU
2.5000 mg | INHALATION_SOLUTION | Freq: Three times a day (TID) | RESPIRATORY_TRACT | Status: DC
  Administered 2013-01-21 – 2013-01-22 (×5): 2.5 mg via RESPIRATORY_TRACT
  Filled 2013-01-20 (×5): qty 0.5

## 2013-01-20 MED ORDER — SODIUM CHLORIDE 0.9 % IJ SOLN: 3.0000 mL | INTRAMUSCULAR | Status: DC | PRN

## 2013-01-20 MED ORDER — SODIUM CHLORIDE 0.9 % IJ SOLN: 3.0000 mL | Freq: Two times a day (BID) | INTRAMUSCULAR | Status: DC

## 2013-01-20 MED ORDER — ONDANSETRON HCL 4 MG PO TABS: 4.0000 mg | ORAL_TABLET | Freq: Four times a day (QID) | ORAL | Status: DC | PRN

## 2013-01-20 MED ORDER — ALBUTEROL SULFATE (5 MG/ML) 0.5% IN NEBU: 2.5000 mg | INHALATION_SOLUTION | RESPIRATORY_TRACT | Status: DC | PRN

## 2013-01-20 MED ORDER — METHYLPREDNISOLONE SODIUM SUCC 125 MG IJ SOLR
60.0000 mg | INTRAMUSCULAR | Status: DC
  Administered 2013-01-20 (×4): 60 mg via INTRAVENOUS
  Administered 2013-01-20: 22:00:00 via INTRAVENOUS
  Administered 2013-01-21 (×3): 60 mg via INTRAVENOUS
  Filled 2013-01-20 (×13): qty 0.96

## 2013-01-20 MED ORDER — VANCOMYCIN HCL IN DEXTROSE 1-5 GM/200ML-% IV SOLN: 1000.0000 mg | Freq: Once | INTRAVENOUS | Status: DC

## 2013-01-20 MED ORDER — ALBUTEROL SULFATE (5 MG/ML) 0.5% IN NEBU: 2.5000 mg | INHALATION_SOLUTION | Freq: Four times a day (QID) | RESPIRATORY_TRACT | Status: DC

## 2013-01-20 NOTE — H&P (Signed)
[facility] History and Physical  [name] ZOX:[id number] DOB: 1911/06/02    PCP:   Kimber Relic, MD   Chief Complaint: shortness of breath, productive cough, and wheezing.  HPI: [name] is an 79 y.o. female nursing home resident with DNR code status, with prior hx of embolic CVA, hypothyroidism, HTN, CHF, and likely undiagnosed COPD as well, presents to the ER as she was having increase shortness of breath, green sputum productive cough and significant wheezing.  She has having borderline hypoxemia with her Sat 91 percent even with supplemental oxygen.  She denied any chest pain, orthopnea or PND.  She did have subjective fever, but no chills.  Evaluation in the ER included a CXR with no edema, left opacity likely atelectasis.  Her WBC is normal, her renal fx tests were normal, with normal electrolytes except NA of 131.  She was found to have significant wheezing in the ER.   Hospitalist was asked to admit her for possible HCAP.  Rewiew of Systems:  Constitutional: Negative for malaise,  chills. No significant weight loss or weight gain Eyes: Negative for eye pain, redness and discharge, diplopia, visual changes, or flashes of light. ENMT: Negative for ear pain, hoarseness, nasal congestion, sinus pressure and sore throat. No headaches; tinnitus, drooling, or problem swallowing. Cardiovascular: Negative for chest pain, palpitations, diaphoresis, and peripheral edema. ; No orthopnea, PND Respiratory: Negative for cough, hemoptysis,  and stridor. No pleuritic chestpain. Gastrointestinal: Negative for nausea, vomiting, diarrhea, constipation, abdominal pain, melena, blood in stool, hematemesis, jaundice and rectal bleeding.    Genitourinary: Negative for frequency, dysuria, incontinence,flank pain and hematuria; Musculoskeletal: Negative for back pain and neck pain. Negative for swelling and trauma.;  Skin: . Negative for pruritus, rash, abrasions, bruising and skin  lesion.; ulcerations Neuro: Negative for headache, lightheadedness and neck stiffness. Negative for weakness, altered level of consciousness , altered mental status, extremity weakness, burning feet, involuntary movement, seizure and syncope.  Psych: negative for anxiety, depression, insomnia, tearfulness, panic attacks, hallucinations, paranoia, suicidal or homicidal ideation    Past Medical History  Diagnosis Date  . Arthritis   . CHF (congestive heart failure)   . Hypothyroidism   . HTN (hypertension), benign   . H/O: CVA (cerebrovascular accident)     History reviewed. No pertinent past surgical history.  Medications:  HOME MEDS: Prior to Admission medications   Not on File     Allergies: Multiple allergies including the quinolones.  Social History:   reports that she does not drink alcohol. Her tobacco and drug histories are not on file.  Family History: No family history on file.   Physical Exam: Filed Vitals:   01/20/13 0002 01/20/13 0053 01/20/13 0315 01/20/13 0517  BP:   121/53 127/46  Pulse:   113 105  Temp:   98.1 F (36.7 C) 97.3 F (36.3 C)  TempSrc:   Oral Oral  Resp:   20 20  Height:   5\' 2"  (1.575 m)   Weight:   64.3 kg (141 lb 12.1 oz)   SpO2: 89% 91% 96% 97%   Blood pressure 127/46, pulse 105, temperature 97.3 F (36.3 C), temperature source Oral, resp. rate 20, height 5\' 2"  (1.575 m), weight 64.3 kg (141 lb 12.1 oz), SpO2 97.00%.  GEN:  Pleasant  patient lying in the stretcher in no acute distress; cooperative with exam. PSYCH:  alert and oriented x4; does not appear anxious or depressed; affect is appropriate. HEENT: Mucous membranes pink and anicteric; PERRLA;  EOM intact; no cervical lymphadenopathy nor thyromegaly or carotid bruit; no JVD; There were no stridor. Neck is very supple. Breasts:: Not examined CHEST WALL: No tenderness CHEST: bilateral wheezing significantly.  Not in respiratory distress however. HEART: Regular rate and rhythm.   There are no murmur, rub, or gallops.   BACK: No kyphosis or scoliosis; no CVA tenderness ABDOMEN: soft and non-tender; no masses, no organomegaly, normal abdominal bowel sounds; no pannus; no intertriginous candida. There is no rebound and no distention. Rectal Exam: Not done EXTREMITIES: No bone or joint deformity; age-appropriate arthropathy of the hands and knees; no edema; no ulcerations.  There is no calf tenderness. Genitalia: not examined PULSES: 2+ and symmetric SKIN: Normal hydration no rash or ulceration CNS: Cranial nerves 2-12 grossly intact no focal lateralizing neurologic deficit.  Speech is fluent; uvula elevated with phonation, facial symmetry and tongue midline. DTR are normal bilaterally, cerebella exam is intact, barbinski is negative and strengths are equaled bilaterally.  No sensory loss.   Labs on Admission:  Basic Metabolic Panel:  Recent Labs Lab 01/19/13 2326  NA 131*  K 3.5  CL 90*  CO2 26  GLUCOSE 145*  BUN 23  CREATININE 0.93  CALCIUM 9.1   Liver Function Tests:  Recent Labs Lab 01/19/13 2326  AST 20  ALT 12  ALKPHOS 56  BILITOT 0.3  PROT 7.2  ALBUMIN 3.3*   No results found for this basename: LIPASE, AMYLASE,  in the last 168 hours No results found for this basename: AMMONIA,  in the last 168 hours CBC:  Recent Labs Lab 01/19/13 2326  WBC 7.6  HGB 15.2*  HCT 45.1  MCV 92.6  PLT 142*   Cardiac Enzymes:  Recent Labs Lab 01/19/13 2326  TROPONINI <0.30    CBG: No results found for this basename: GLUCAP,  in the last 168 hours   Radiological Exams on Admission: Dg Chest Portable 1 View  01/19/2013  *RADIOLOGY REPORT*  Clinical Data: Shortness of breath, CHF  PORTABLE CHEST - 1 VIEW  Comparison: 02/02/2007  Findings: Chronic interstitial markings.  No frank interstitial edema.  Mild patchy left basilar opacity, likely atelectasis.  No pleural effusion or pneumothorax.  The heart is normal in size.  IMPRESSION: Chronic  interstitial markings.  No frank interstitial edema.  Left basilar opacity, likely atelectasis.   Original Report Authenticated By: Charline Bills, M.D.     Assessment/Plan Present on Admission:  . Bronchospasm, acute . HTN (hypertension) Hypothyroidism Possible HCAP. DNR.  PLAN:  Will admit her for likely HCAP.  Since she is allergic to quinolone, will Tx her with VANC/Cefepime.  She will also needs steroid, and I have given her full dose intravenously.  She will receive frequent neb tx and supplemental oxygen.  Her med list has not been reconciled as the Epic down time was more than five hours last night.  Please reconcile her meds from the nursing home as soon as pharmacy completed them.  She is very stable and will be admitted to telemetry under [facility] service.  Will check a TSH as she is hypothyroid on supplement.  Thank you for allowing me to partake in the care of your nice patient.  Other plans as per orders.  Code Status: DNR (with the golden rod form)   Jeson Camacho, MD. [facility] Pager [phone] 7pm to 7am.  01/20/2013, 6:53 AM

## 2013-01-20 NOTE — Progress Notes (Signed)
Treatment given during system downtime at 0053. She was given albuterol 5mg  with normal saline. Her HR was 116, respirations 19, tolerated well, breath sounds were diminished and she was 91% on room air.

## 2013-01-20 NOTE — Progress Notes (Signed)
Pt seen and examined at bedside, vital signs and blood work available. Pt is stable and maintaining oxygen saturations at target range. Admitted with diagnosis of HCAP and please see earlier note H&P by Dr. Conley Rolls. Agree with choice of ABX coverage, solumedrol, follow up sputum analysis. CBC and BMP in AM.  Debbora Presto, MD  [facility] Pager [phone]  If 7PM-7AM, please contact night-coverage [email or web address] Password [email or web address]

## 2013-01-20 NOTE — Progress Notes (Signed)
Utilization review completed.  

## 2013-01-20 NOTE — Progress Notes (Signed)
ANTIBIOTIC CONSULT NOTE - INITIAL  Pharmacy Consult for Vancomycin/cefepime Indication: rule out pneumonia  Allergies not on file  Patient Measurements: Height: 5\' 2"  (157.5 cm) Weight: 142 lb (64.411 kg) IBW/kg (Calculated) : 50.1 Adjusted Body Weight:   Vital Signs: Temp: 98.2 F (36.8 C) (02/07 2308) Temp src: Oral (02/07 2308) BP: 136/74 mmHg (02/07 2308) Pulse Rate: 120 (02/07 2308) Intake/Output from previous day: 02/07 0701 - 02/08 0700 In: -  Out: 250 [Urine:250] Intake/Output from this shift: Total I/O In: -  Out: 250 [Urine:250]  Labs:  Recent Labs  01/19/13 2326  WBC 7.6  HGB 15.2*  PLT 142*  CREATININE 0.93   Estimated Creatinine Clearance: 29 ml/min (by C-G formula based on Cr of 0.93). No results found for this basename: VANCOTROUGH, VANCOPEAK, VANCORANDOM, GENTTROUGH, GENTPEAK, GENTRANDOM, TOBRATROUGH, TOBRAPEAK, TOBRARND, AMIKACINPEAK, AMIKACINTROU, AMIKACIN,  in the last 72 hours   Microbiology: No results found for this or any previous visit (from the past 720 hour(s)).  Medical History: Past Medical History  Diagnosis Date  . Arthritis   . CHF (congestive heart failure)   . Hypothyroidism   . HTN (hypertension), benign   . H/O: CVA (cerebrovascular accident)     Medications:  No prescriptions prior to admission   Anti-infectives   Start     Dose/Rate Route Frequency Ordered Stop   01/20/13 2200  vancomycin (VANCOCIN) 750 mg in sodium chloride 0.9 % 150 mL IVPB     750 mg 150 mL/hr over 60 Minutes Intravenous Daily at bedtime 01/20/13 0631     01/20/13 0630  ceFEPIme (MAXIPIME) 1 g in dextrose 5 % 50 mL IVPB     1 g 100 mL/hr over 30 Minutes Intravenous Daily 01/20/13 0621     01/20/13 0600  levofloxacin (LEVAQUIN) IVPB 500 mg  Status:  Discontinued     500 mg 100 mL/hr over 60 Minutes Intravenous Every 24 hours 01/20/13 0552 01/20/13 0558   01/20/13 0100  vancomycin (VANCOCIN) IVPB 1000 mg/200 mL premix     1,000 mg 200 mL/hr  over 60 Minutes Intravenous  Once 01/20/13 0535     01/19/13 2330  levofloxacin (LEVAQUIN) IVPB 750 mg  Status:  Discontinued     750 mg 100 mL/hr over 90 Minutes Intravenous Every 24 hours 01/19/13 2324 01/20/13 0605     Assessment: Patient with r/o PNA.  First dose of vancomycin already given in ED per floor RN. Patient with poor renal function   Goal of Therapy:  Vancomycin trough level 15-20 mcg/ml Cefepime dosed based on patient weight and renal function   Plan:  Measure antibiotic drug levels at steady state Follow up culture results Vancomycin 750mg  iv q24hr Cefepime 1gm iv q24hr  Aleene Davidson Crowford 01/20/2013,6:31 AM

## 2013-01-21 ENCOUNTER — Encounter (HOSPITAL_COMMUNITY): Payer: Self-pay | Admitting: *Deleted

## 2013-01-21 LAB — CBC
Hemoglobin: 15.1 g/dL — ABNORMAL HIGH (ref 12.0–15.0)
RBC: 4.87 MIL/uL (ref 3.87–5.11)
WBC: 19.8 10*3/uL — ABNORMAL HIGH (ref 4.0–10.5)

## 2013-01-21 LAB — BASIC METABOLIC PANEL
CO2: 24 mEq/L (ref 19–32)
Glucose, Bld: 155 mg/dL — ABNORMAL HIGH (ref 70–99)
Potassium: 4.2 mEq/L (ref 3.5–5.1)
Sodium: 131 mEq/L — ABNORMAL LOW (ref 135–145)

## 2013-01-21 MED ORDER — PREDNISONE 50 MG PO TABS
50.0000 mg | ORAL_TABLET | Freq: Every day | ORAL | Status: DC
  Administered 2013-01-22: 50 mg via ORAL
  Filled 2013-01-21 (×2): qty 1

## 2013-01-21 MED ORDER — PNEUMOCOCCAL VAC POLYVALENT 25 MCG/0.5ML IJ INJ
0.5000 mL | INJECTION | INTRAMUSCULAR | Status: AC
  Administered 2013-01-22: 0.5 mL via INTRAMUSCULAR
  Filled 2013-01-21 (×2): qty 0.5

## 2013-01-21 NOTE — Progress Notes (Signed)
Patient ID: [name], female   DOB: Sep 23, 1911, 79 y.o.   MRN: [id number]  [facility] PROGRESS NOTE  [name] YQM:[id number] DOB: 1911/06/10 DOA: 01/19/2013 PCP: Kimber Relic, MD  Brief narrative: Pt is 79 y.o. Female, nursing home resident with DNR code status, with prior hx of embolic CVA, hypothyroidism, HTN, CHF, and likely undiagnosed COPD as well, who presented to the ER with progressively worsening shortness of breath, initially noted to be exertional but progressed to dyspnea at rest, associated with cough productive of green sputum, subjective fevers, chills.  Evaluation in the ER included a CXR which showed no pulmonary edema, but was notable for left opacity likely atelectasis and ? PNA. Her WBC was normal, her renal fx tests were also normal in ED. She was found to have significant wheezing in the ER with oxygen saturations in high 80's and low 90's. Hospitalist was asked to admit pt for possible HCAP.  Principal Problem:   COPD with exacerbation - clinically improving and maintaining oxygen saturations at target range - continue supportive care with nebs, oxygen as needed - ABX: Vancomycin and Maxipime Active Problems:   HCAP - continue ABX as noted above day #2 - supportive care as pt needs - leukocytosis likely from steroids, plan on tapering as clinically indicated    HTN (hypertension) - reasonable inpatient control   Hypothyroidism - continue synthroid  Consultants:  None Procedures/Studies:  Dg Chest Portable 1 View 01/19/2013  --> Chronic interstitial markings.  No frank interstitial edema.  Left basilar opacity, likely atelectasis.    Antibiotics:  Vancomycin 02/08 -->  Maxipime 02/08 -->   Code Status: DNR Family Communication: Pt at bedside Disposition Plan: SNF in 1-2 days   HPI/Subjective: No events overnight.   Objective: Filed Vitals:   01/21/13 0300 01/21/13 0500 01/21/13 0851 01/21/13 0929  BP: 177/89 152/70  142/54   Pulse: 103 104  99  Temp: 97.4 F (36.3 C)   97.7 F (36.5 C)  TempSrc: Oral   Oral  Resp: 20   22  Height:      Weight: 64.4 kg (141 lb 15.6 oz)     SpO2: 95%  93% 98%    Intake/Output Summary (Last 24 hours) at 01/21/13 1251 Last data filed at 01/21/13 0900  Gross per 24 hour  Intake 2637.5 ml  Output   1100 ml  Net 1537.5 ml    Exam:   General:  Pt is alert, follows commands appropriately, not in acute distress, hard of hearing  Cardiovascular: Regular rate and rhythm, S1/S2, no murmurs, no rubs, no gallops  Respiratory: Clear to auscultation bilaterally, no wheezing, decreased breath sounds at bases   Abdomen: Soft, non tender, non distended, bowel sounds present, no guarding  Extremities: No edema, pulses DP and PT palpable bilaterally  Neuro: Grossly nonfocal  Data Reviewed: Basic Metabolic Panel:  Recent Labs Lab 01/19/13 2326 01/21/13 0450  NA 131* 131*  K 3.5 4.2  CL 90* 96  CO2 26 24  GLUCOSE 145* 155*  BUN 23 21  CREATININE 0.93 0.74  CALCIUM 9.1 8.7   Liver Function Tests:  Recent Labs Lab 01/19/13 2326  AST 20  ALT 12  ALKPHOS 56  BILITOT 0.3  PROT 7.2  ALBUMIN 3.3*   CBC:  Recent Labs Lab 01/19/13 2326 01/21/13 0450  WBC 7.6 19.8*  HGB 15.2* 15.1*  HCT 45.1 44.8  MCV 92.6 92.0  PLT 142* 157   Cardiac Enzymes:  Recent Labs Lab  01/19/13 2326  TROPONINI <0.30   Recent Results (from the past 240 hour(s))  MRSA PCR SCREENING     Status: None   Collection Time    01/20/13  6:35 PM      Result Value Range Status   MRSA by PCR NEGATIVE  NEGATIVE Final   Comment:            The GeneXpert MRSA Assay (FDA     approved for NASAL specimens     only), is one component of a     comprehensive MRSA colonization     surveillance program. It is not     intended to diagnose MRSA     infection nor to guide or     monitor treatment for     MRSA infections.  CULTURE, EXPECTORATED SPUTUM-ASSESSMENT     Status: None   Collection  Time    01/20/13  9:12 PM      Result Value Range Status   Specimen Description SPUTUM   Final   Special Requests NONE   Final   Sputum evaluation     Final   Value: THIS SPECIMEN IS ACCEPTABLE. RESPIRATORY CULTURE REPORT TO FOLLOW.   Report Status 01/20/2013 FINAL   Final     Scheduled Meds: . albuterol  2.5 mg Nebulization TID  . ceFEPime  IV  1 g Intravenous Q0600  . docusate sodium  100 mg Oral BID  . heparin  5,000 Units Subcutaneous Q8H  . methylPREDNISolone inj  60 mg Intravenous Q4H  . vancomycin  750 mg Intravenous QHS   Continuous Infusions: . sodium chloride 50 mL/hr at 01/21/13 1043     Debbora Presto, MD  [facility] Pager [phone]  If 7PM-7AM, please contact night-coverage [email or web address] Password [email or web address] 01/21/2013, 12:51 PM   LOS: 2 days

## 2013-01-22 LAB — CBC
HCT: 40.5 % (ref 36.0–46.0)
MCH: 30.7 pg (ref 26.0–34.0)
MCHC: 33.3 g/dL (ref 30.0–36.0)
RDW: 13.6 % (ref 11.5–15.5)

## 2013-01-22 LAB — BASIC METABOLIC PANEL
BUN: 31 mg/dL — ABNORMAL HIGH (ref 6–23)
Calcium: 8.3 mg/dL — ABNORMAL LOW (ref 8.4–10.5)
GFR calc Af Amer: 80 mL/min — ABNORMAL LOW (ref >90)
GFR calc non Af Amer: 69 mL/min — ABNORMAL LOW (ref >90)
Potassium: 4.3 mEq/L (ref 3.5–5.1)
Sodium: 133 mEq/L — ABNORMAL LOW (ref 135–145)

## 2013-01-22 MED ORDER — PREDNISONE 50 MG PO TABS: ORAL_TABLET | ORAL | Status: DC

## 2013-01-22 MED ORDER — HYDROCODONE-ACETAMINOPHEN 5-325 MG PO TABS: 1.0000 | ORAL_TABLET | Freq: Two times a day (BID) | ORAL | Status: DC

## 2013-01-22 MED ORDER — AZITHROMYCIN 250 MG PO TABS: 250.0000 mg | ORAL_TABLET | Freq: Every day | ORAL | Status: DC

## 2013-01-22 MED ORDER — ALBUTEROL SULFATE (5 MG/ML) 0.5% IN NEBU: 2.5000 mg | INHALATION_SOLUTION | Freq: Three times a day (TID) | RESPIRATORY_TRACT | Status: DC

## 2013-01-22 NOTE — Discharge Summary (Addendum)
Physician Discharge Summary  BREANNE OLVERA ZOX:[id number] DOB: 1911-02-04 DOA: 01/19/2013  PCP: Kimber Relic, MD  Admit date: 01/19/2013 Discharge date: 01/22/2013  Recommendations for Outpatient Follow-up:  1. Pt will need to follow up with PCP in 2-3 weeks post discharge 2. Please obtain BMP to evaluate electrolytes and kidney function 3. Please also check CBC to evaluate Hg and Hct levels 4. Please note that pt was discharged on Zithromax to complete the therapy for 7 more days post discharge 5. Please also note that due to wheezing pt was started on Albuterol nebulizer 3 times per day and has responded well, this can be discontinued in 1-2 weeks based on clinical response 6. Please note that pt was also discharged on Prednisone taper pack 7. Please note that pt was discharged on oxygen, 2L continuous flow   Discharge Diagnoses: Acute respiratory failure secondary to acute COPD exacerbation with HCAP Principal Problem:   COPD with exacerbation Active Problems:   Bronchospasm, acute   HTN (hypertension)   Hypothyroidism  Discharge Condition: Stable  Diet recommendation: Heart healthy diet discussed in details   Brief narrative:  Pt is 79 y.o. Female, nursing home resident with DNR code status, with prior hx of embolic CVA, hypothyroidism, HTN, CHF, and likely undiagnosed COPD as well, who presented to the ER with progressively worsening shortness of breath, initially noted to be exertional but progressed to dyspnea at rest, associated with cough productive of green sputum, subjective fevers, chills.  Evaluation in the ER included a CXR which showed no pulmonary edema, but was notable for left opacity likely atelectasis and ? PNA. Her WBC was normal, her renal fx tests were also normal in ED. She was found to have significant wheezing in the ER with oxygen saturations in high 80's and low 90's. Hospitalist was asked to admit pt for possible HCAP.   Principal Problem:  COPD with  exacerbation  - clinically improving and maintaining oxygen saturations at target range  - continue supportive care with nebs, oxygen - ABX: Vancomycin and Maxipime completed for 3 days and pt switched to oral Zithromax Active Problems:  HCAP  - continue ABX as noted above day #3 and plan to transition to oral Zithromax for 7 more day   - supportive care as pt needs  - leukocytosis likely from steroids, plan on tapering as clinically indicated  - WBC trending down  HTN (hypertension)  - reasonable inpatient control  Hypothyroidism  - continue synthroid  Moderate obesity - dietary recommendations provided Consultants:  None Procedures/Studies:  Dg Chest Portable 1 View 01/19/2013 --> Chronic interstitial markings. No frank interstitial edema. Left basilar opacity, likely atelectasis.  Antibiotics:  Vancomycin 02/08 --> 02/10 Maxipime 02/08 --> 02/10 Zithromax 02/10 --> 02/17  Code Status: DNR  Family Communication: Pt at bedside  Disposition Plan: SNF today  Discharge Exam: Filed Vitals:   01/22/13 0510  BP: 151/65  Pulse: 77  Temp: 98 F (36.7 C)  Resp: 18   Filed Vitals:   01/21/13 2015 01/21/13 2113 01/22/13 0510 01/22/13 0801  BP:  139/60 151/65   Pulse:  96 77   Temp:  98.1 F (36.7 C) 98 F (36.7 C)   TempSrc:  Oral Oral   Resp:  20 18   Height:      Weight:      SpO2: 96% 98% 99% 94%    General: Pt is alert, follows commands appropriately, not in acute distress Cardiovascular: Regular rate and rhythm, S1/S2 +, no murmurs,  no rubs, no gallops Respiratory: Clear to auscultation bilaterally, no wheezing, decreased breath sounds at bases  Abdominal: Soft, non tender, non distended, bowel sounds +, no guarding Extremities: no edema, no cyanosis, pulses palpable bilaterally DP and PT Neuro: Grossly nonfocal  Discharge Instructions  Discharge Orders   Future Orders Complete By Expires     Diet - low sodium heart healthy  As directed     Increase activity  slowly  As directed         Medication List    TAKE these medications       acetaminophen 325 MG tablet  Commonly known as:  TYLENOL  Take 650 mg by mouth every 4 (four) hours as needed for pain. Pain/headache     albuterol (5 MG/ML) 0.5% nebulizer solution  Commonly known as:  PROVENTIL  Take 0.5 mLs (2.5 mg total) by nebulization 3 (three) times daily.     aspirin 81 MG chewable tablet  Chew 81 mg by mouth daily.     azithromycin 250 MG tablet  Commonly known as:  ZITHROMAX  Take 1 tablet (250 mg total) by mouth daily.     B-complex with vitamin C tablet  Take 1 tablet by mouth daily.     calcium-vitamin D 500-200 MG-UNIT per tablet  Commonly known as:  OSCAL WITH D  Take 1 tablet by mouth daily.     docusate sodium 100 MG capsule  Commonly known as:  COLACE  Take 200 mg by mouth 2 (two) times daily.     fexofenadine 60 MG tablet  Commonly known as:  ALLEGRA  Take 60 mg by mouth daily.     HYDROcodone-acetaminophen 5-325 MG per tablet  Commonly known as:  NORCO/VICODIN  Take 1 tablet by mouth 2 (two) times daily.     levothyroxine 100 MCG tablet  Commonly known as:  SYNTHROID, LEVOTHROID  Take 100 mcg by mouth daily.     multivitamin with minerals Tabs  Take 1 tablet by mouth daily.     predniSONE 50 MG tablet  Commonly known as:  DELTASONE  Taper down by 10 mg daily until completed, start with 50 mg tablet once per day     traMADol 50 MG tablet  Commonly known as:  ULTRAM  Take 50 mg by mouth every 6 (six) hours as needed for pain. Pain           Follow-up Information   Follow up with GREEN, Lenon Curt, MD In 2 weeks.   Contact information:   [address] [place] [address] [phone]        The results of significant diagnostics from this hospitalization (including imaging, microbiology, ancillary and laboratory) are listed below for reference.     Microbiology: Recent Results (from the past 240 hour(s))  MRSA PCR SCREENING      Status: None   Collection Time    01/20/13  6:35 PM      Result Value Range Status   MRSA by PCR NEGATIVE  NEGATIVE Final   Comment:            The GeneXpert MRSA Assay (FDA     approved for NASAL specimens     only), is one component of a     comprehensive MRSA colonization     surveillance program. It is not     intended to diagnose MRSA     infection nor to guide or     monitor treatment for  MRSA infections.  CULTURE, EXPECTORATED SPUTUM-ASSESSMENT     Status: None   Collection Time    01/20/13  9:12 PM      Result Value Range Status   Specimen Description SPUTUM   Final   Special Requests NONE   Final   Sputum evaluation     Final   Value: THIS SPECIMEN IS ACCEPTABLE. RESPIRATORY CULTURE REPORT TO FOLLOW.   Report Status 01/20/2013 FINAL   Final     Labs: Basic Metabolic Panel:  Recent Labs Lab 01/19/13 2326 01/21/13 0450 01/22/13 0458  NA 131* 131* 133*  K 3.5 4.2 4.3  CL 90* 96 101  CO2 26 24 25   GLUCOSE 145* 155* 127*  BUN 23 21 31*  CREATININE 0.93 0.74 0.72  CALCIUM 9.1 8.7 8.3*   Liver Function Tests:  Recent Labs Lab 01/19/13 2326  AST 20  ALT 12  ALKPHOS 56  BILITOT 0.3  PROT 7.2  ALBUMIN 3.3*   CBC:  Recent Labs Lab 01/19/13 2326 01/21/13 0450 01/22/13 0458  WBC 7.6 19.8* 17.7*  HGB 15.2* 15.1* 13.5  HCT 45.1 44.8 40.5  MCV 92.6 92.0 92.0  PLT 142* 157 160   Cardiac Enzymes:  Recent Labs Lab 01/19/13 2326  TROPONINI <0.30   BNP: BNP (last 3 results)  Recent Labs  01/19/13 2326  PROBNP 500.2*    SIGNED: Time coordinating discharge: Over 30 minutes  Debbora Presto, MD  [facility] 01/22/2013, 9:25 AM Pager [phone]  If 7PM-7AM, please contact night-coverage [email or web address] Password [email or web address]

## 2013-01-22 NOTE — Progress Notes (Signed)
Nutrition Brief Note  Patient identified on the Malnutrition Screening Tool (MST) Report with MST score of 3.  Body mass index is 25.96 kg/(m^2). Patient meets criteria for Overweight based on current BMI.   Current diet order is Low sodium, heart healthy, patient is consuming approximately 75-100% of meals at this time.  Pt admitted with complaints of SOB, productive cough, and wheezing. Pt was asleep at time of visit. Per RN Charline Bills pt has been eating well and is being discharged today. Labs and medications reviewed.   No nutrition interventions warranted at this time. If nutrition issues arise, please consult RD.   Ian Malkin RD, LDN Inpatient Clinical Dietitian Pager: [phone] After Hours Pager: [phone]

## 2013-01-22 NOTE — Progress Notes (Signed)
Addendum As documented, patient's sats on room air is 85% at rest. B Shelba Flake

## 2013-01-22 NOTE — Progress Notes (Signed)
Report called to RN at [facility]. Discharge reviewed and all questions answered. K. Lillith Mcneff RN  

## 2013-01-22 NOTE — Progress Notes (Signed)
Noted room air sats 85%, orders for home 02 called to [facility]; Abelino Derrick RN,BSN,MHA

## 2013-01-23 LAB — BASIC METABOLIC PANEL
Creatinine: 0.7 mg/dL (ref 0.5–1.1)
Sodium: 136 mmol/L — AB (ref 137–147)

## 2013-01-23 LAB — CBC AND DIFFERENTIAL: Platelets: 163 10*3/uL (ref 150–399)

## 2013-01-23 LAB — CULTURE, RESPIRATORY W GRAM STAIN: Culture: NORMAL

## 2013-02-08 DIAGNOSIS — H9190 Unspecified hearing loss, unspecified ear: Secondary | ICD-10-CM

## 2013-02-08 HISTORY — DX: Unspecified hearing loss, unspecified ear: H91.90

## 2013-03-23 LAB — TSH: TSH: 2.18 u[IU]/mL (ref <=5.90)

## 2013-05-03 ENCOUNTER — Non-Acute Institutional Stay: Payer: Medicare PPO | Admitting: Nurse Practitioner

## 2013-05-03 VITALS — BP 144/76 | HR 60 | Ht 62.0 in | Wt 146.0 lb

## 2013-05-03 DIAGNOSIS — K5900 Constipation, unspecified: Secondary | ICD-10-CM

## 2013-05-03 DIAGNOSIS — J302 Other seasonal allergic rhinitis: Secondary | ICD-10-CM

## 2013-05-03 DIAGNOSIS — R252 Cramp and spasm: Secondary | ICD-10-CM | POA: Insufficient documentation

## 2013-05-03 DIAGNOSIS — I10 Essential (primary) hypertension: Secondary | ICD-10-CM

## 2013-05-03 DIAGNOSIS — J309 Allergic rhinitis, unspecified: Secondary | ICD-10-CM

## 2013-05-03 DIAGNOSIS — Z299 Encounter for prophylactic measures, unspecified: Secondary | ICD-10-CM

## 2013-05-03 DIAGNOSIS — G5790 Unspecified mononeuropathy of unspecified lower limb: Secondary | ICD-10-CM

## 2013-05-03 DIAGNOSIS — G5793 Unspecified mononeuropathy of bilateral lower limbs: Secondary | ICD-10-CM

## 2013-05-03 DIAGNOSIS — E039 Hypothyroidism, unspecified: Secondary | ICD-10-CM

## 2013-05-03 HISTORY — DX: Other seasonal allergic rhinitis: J30.2

## 2013-05-03 HISTORY — DX: Constipation, unspecified: K59.00

## 2013-05-03 NOTE — Assessment & Plan Note (Signed)
Controlled w/o antihypertensive agent.   

## 2013-05-03 NOTE — Progress Notes (Signed)
Patient ID: [name], female   DOB: [birth year], 79 y.o.   MRN: [id number] Code Status:  Chief Complaint  Patient presents with  . Medical Managment of Chronic Issues    thyroid, blood pressure, COPD, edema     Allergies  Allergen Reactions  . Amoxicillin   . Avelox (Moxifloxacin)   . Erythromycin   . Monistat (Miconazole)   . Morphine And Related     Chief Complaint  Patient presents with  . Medical Managment of Chronic Issues    thyroid, blood pressure, COPD, edema    HPI: Patient is a 79 y.o. female seen in the clinic at [facility] today for routine visit to manage her chronic conditions.  Problem List Items Addressed This Visit   HTN (hypertension)     Controlled w/o antihypertensive agent.     Hypothyroidism - Primary     TSH was 13.082 02/15/13--Levothyroxine was increased to subsequently--follow up TSH2.177 03/23/13    Relevant Medications      levothyroxine (SYNTHROID, LEVOTHROID) 150 MCG tablet   Unspecified constipation     No problem on Colace 200mg  daily.     Seasonal allergies     Symptomatic controlled on Fexofenadine 60mg  daily.     Neuropathic pain of both legs     Pain is not well controlled on Hydrocodone/ApAp 5/325mg  bid(hx of falling associated with Gabapentin use). Pain mainly in back and knees--will increase Norco to tid.     Cramping of hands     New-to the patient-not disabling, may consider X-ray of the cervical spine, check CBC and BMP     Other Visit Diagnoses   Preventive measure        Relevant Orders       DNR (Do Not Resuscitate)       Review of Systems:  Review of Systems  Constitutional: Negative for fever, chills, weight loss, malaise/fatigue and diaphoresis.  HENT: Positive for hearing loss (severe, f/u audiology, recently tube placed in the left ear drum, f/u  ENT next week. ). Negative for ear pain, congestion, sore throat, neck pain and ear discharge.   Eyes: Negative for blurred vision, double  vision, photophobia, pain, discharge and redness.  Respiratory: Negative for cough, sputum production and shortness of breath.   Cardiovascular: Positive for leg swelling (chronic. The patient stated its better than prior. ). Negative for chest pain, palpitations, orthopnea, claudication and PND.  Gastrointestinal: Negative for heartburn, nausea, vomiting, abdominal pain, diarrhea, constipation and blood in stool.  Genitourinary: Positive for frequency. Negative for dysuria, urgency, hematuria and flank pain.  Musculoskeletal: Positive for back pain and joint pain (knees). Negative for myalgias and falls.       Hand cram--warm water bottle helps at night.   Skin: Negative for itching and rash.  Neurological: Positive for sensory change (neuropathic pain in legs ). Negative for dizziness, tingling, tremors, speech change, focal weakness, seizures, loss of consciousness and weakness.  Endo/Heme/Allergies: Negative for environmental allergies and polydipsia. Does not bruise/bleed easily.  Psychiatric/Behavioral: Positive for memory loss. Negative for depression and hallucinations. The patient is not nervous/anxious and does not have insomnia.      Past Medical History  Diagnosis Date  . Arthritis   . CHF (congestive heart failure)   . Hypothyroidism   . HTN (hypertension), benign   . H/O: CVA (cerebrovascular accident)   . COPD (chronic obstructive pulmonary disease)   . Unspecified hearing loss 02/08/2013  . Unspecified constipation 11/02/2012  . Candidiasis of  other urogenital sites 08/10/2012  . Insomnia, unspecified 08/10/2012  . Pain in joint, ankle and foot 06/14/2012  . Other and unspecified hyperlipidemia 06/01/2012  . Contusion of face, scalp, and neck except eye(s) 06/01/2012  . Contusion of wrist 06/01/2012  . Edema 04/20/2012  . Open wound of knee, leg (except thigh), and ankle, without mention of complication 04/20/2012  . Closed fracture of sacrum and coccyx without mention of spinal  cord injury 02/14/2012  . Unspecified hereditary and idiopathic peripheral neuropathy 01/27/2012  . Peripheral vascular disease, unspecified 01/27/2012  . Other disorder of coccyx 01/27/2012  . Personal history of fall 01/27/2012  . Ventricular fibrillation 01/24/2012  . Acute kidney failure, unspecified 01/24/2012  . Cerebral embolism 05/23/2005  . Pneumonia, organism unspecified 01/23/2005   Past Surgical History  Procedure Laterality Date  . Abdominal hysterectomy  1990    for endometrial cancer  . Tonsillectomy  1916  . Mastoidectomy  1920    bilateral  . Gum surgery  1932  . Ectopic pregnancy surgery  1952  . Thyroidectomy  1960  . Cataract extraction w/ intraocular lens  implant, bilateral     Social History:   reports that she has never smoked. She has never used smokeless tobacco. She reports that she does not drink alcohol or use illicit drugs.  History reviewed. No pertinent family history.  Medications: Patient's Medications  New Prescriptions   No medications on file  Previous Medications   ACETAMINOPHEN (TYLENOL) 325 MG TABLET    Take 650 mg by mouth every 4 (four) hours as needed for pain. Pain/headache   ASPIRIN 81 MG CHEWABLE TABLET    Chew 81 mg by mouth daily.   B COMPLEX-C (B-COMPLEX WITH VITAMIN C) TABLET    Take 1 tablet by mouth daily.   CALCIUM CITRATE-VITAMIN D (CITRACAL+D) 315-200 MG-UNIT PER TABLET    Take 1 tablet by mouth daily.   DOCUSATE SODIUM (COLACE) 100 MG CAPSULE    Take 200 mg by mouth. Take 2 tablets once a day   FEXOFENADINE (ALLEGRA) 60 MG TABLET    Take 60 mg by mouth daily.   HYDROCODONE-ACETAMINOPHEN (NORCO/VICODIN) 5-325 MG PER TABLET    Take 1 tablet by mouth 2 (two) times daily.   LEVOTHYROXINE (SYNTHROID, LEVOTHROID) 150 MCG TABLET    Take 150 mcg by mouth daily before breakfast.   MULTIPLE VITAMIN (MULTIVITAMIN WITH MINERALS) TABS    Take 1 tablet by mouth daily.   SACCHAROMYCES BOULARDII (FLORASTOR) 250 MG CAPSULE    Take 250 mg by mouth  2 (two) times daily.   TRAMADOL (ULTRAM) 50 MG TABLET    Take 50 mg by mouth every 6 (six) hours as needed for pain. Pain  Modified Medications   No medications on file  Discontinued Medications   ALBUTEROL (PROVENTIL) (5 MG/ML) 0.5% NEBULIZER SOLUTION    Take 0.5 mLs (2.5 mg total) by nebulization 3 (three) times daily.   AZITHROMYCIN (ZITHROMAX) 250 MG TABLET    Take 1 tablet (250 mg total) by mouth daily.   CALCIUM-VITAMIN D (OSCAL WITH D) 500-200 MG-UNIT PER TABLET    Take 1 tablet by mouth daily.   LEVOTHYROXINE (SYNTHROID, LEVOTHROID) 100 MCG TABLET    Take 100 mcg by mouth daily.   PREDNISONE (DELTASONE) 50 MG TABLET    Taper down by 10 mg daily until completed, start with 50 mg tablet once per day     Physical Exam: Physical Exam  Constitutional: She is oriented to person, place, and  time. She appears well-developed and well-nourished. No distress.  HENT:  Head: Normocephalic and atraumatic.  Right Ear: External ear normal.  Left Ear: External ear normal.  Nose: Nose normal.  Mouth/Throat: Oropharynx is clear and moist. No oropharyngeal exudate.  Left TM tube seen.   Eyes: Conjunctivae and EOM are normal. Pupils are equal, round, and reactive to light. Right eye exhibits no discharge. Left eye exhibits no discharge.  Neck: Normal range of motion. Neck supple. No JVD present. No thyromegaly present.  Cardiovascular: Exam reveals no gallop and no friction rub.   No murmur heard. Pulmonary/Chest: Effort normal. No respiratory distress. She has rales in the right lower field and the left lower field. She exhibits no tenderness.  Abdominal: Soft. Bowel sounds are normal. She exhibits no distension and no mass. There is no tenderness. There is no guarding. No hernia.  Musculoskeletal: Normal range of motion. She exhibits edema (pedal and ankles. ) and tenderness (c/o lower back and knees pain. ).  Lymphadenopathy:    She has no cervical adenopathy.  Neurological: She is alert and  oriented to person, place, and time. She displays normal reflexes. No cranial nerve deficit. Coordination normal.  Skin: Skin is warm and dry. No rash noted. She is not diaphoretic. No erythema. No pallor.  Psychiatric: She has a normal mood and affect. Her behavior is normal. Judgment and thought content normal.  Some confusion    Filed Vitals:   05/03/13 1441  BP: 144/76  Pulse: 60  Height: 5\' 2"  (1.575 m)  Weight: 146 lb (66.225 kg)      Labs reviewed: Basic Metabolic Panel:  Recent Labs  16/10/96 2326 01/21/13 0450 01/22/13 0458 01/23/13 03/23/13  NA 131* 131* 133* 136*  --   K 3.5 4.2 4.3 4.0  --   CL 90* 96 101  --   --   CO2 26 24 25   --   --   GLUCOSE 145* 155* 127*  --   --   BUN 23 21 31* 27*  --   CREATININE 0.93 0.74 0.72 0.7  --   CALCIUM 9.1 8.7 8.3*  --   --   TSH 8.775*  --   --   --  2.18   Liver Function Tests:  Recent Labs  01/19/13 2326  AST 20  ALT 12  ALKPHOS 56  BILITOT 0.3  PROT 7.2  ALBUMIN 3.3*   No results found for this basename: LIPASE, AMYLASE,  in the last 8760 hours No results found for this basename: AMMONIA,  in the last 8760 hours CBC:  Recent Labs  01/19/13 2326 01/21/13 0450 01/22/13 0458 01/23/13  WBC 7.6 19.8* 17.7* 11.0  HGB 15.2* 15.1* 13.5 14.5  HCT 45.1 44.8 40.5 43  MCV 92.6 92.0 92.0  --   PLT 142* 157 160 163   Lipid Panel: No results found for this basename: CHOL, HDL, LDLCALC, TRIG, CHOLHDL, LDLDIRECT,  in the last 8760 hours Anemia Panel: No results found for this basename: FOLATE, IRON, VITAMINB12,  in the last 8760 hours  Past Procedures:     Assessment/Plan Hypothyroidism TSH was 13.082 02/15/13--Levothyroxine was increased to subsequently--follow up TSH2.177 03/23/13  HTN (hypertension) Controlled w/o antihypertensive agent.   Unspecified constipation No problem on Colace 200mg  daily.   Seasonal allergies Symptomatic controlled on Fexofenadine 60mg  daily.   Neuropathic pain of  both legs Pain is not well controlled on Hydrocodone/ApAp 5/325mg  bid(hx of falling associated with Gabapentin use). Pain mainly in back  and knees--will increase Norco to tid.   Cramping of hands New-to the patient-not disabling, may consider X-ray of the cervical spine, check CBC and BMP    Family/ Staff Communication: safety  Goals of Care: AL for care.   Labs/tests ordered: CBC, BMP, X-ray 3 views of the Cervical spine.

## 2013-05-03 NOTE — Assessment & Plan Note (Signed)
Symptomatic controlled on Fexofenadine 60mg daily.    

## 2013-05-03 NOTE — Assessment & Plan Note (Signed)
TSH was 13.082 02/15/13--Levothyroxine was increased to subsequently--follow up TSH2.177 03/23/13

## 2013-05-03 NOTE — Assessment & Plan Note (Signed)
New-to the patient-not disabling, may consider X-ray of the cervical spine, check CBC and BMP

## 2013-05-03 NOTE — Assessment & Plan Note (Signed)
No problem on Colace 200mg daily.    

## 2013-05-03 NOTE — Assessment & Plan Note (Addendum)
Pain is not well controlled on Hydrocodone/ApAp 5/325mg  bid(hx of falling associated with Gabapentin use). Pain mainly in back and knees--will increase Norco to tid.

## 2013-05-08 LAB — BASIC METABOLIC PANEL
Creatinine: 0.8 mg/dL (ref 0.5–1.1)
Potassium: 4.3 mmol/L (ref 3.4–5.3)
Sodium: 141 mmol/L (ref 137–147)

## 2013-05-08 LAB — CBC AND DIFFERENTIAL
Hemoglobin: 13.5 g/dL (ref 12.0–16.0)
WBC: 6.6 10^3/mL

## 2013-08-09 ENCOUNTER — Non-Acute Institutional Stay: Payer: Medicare PPO | Admitting: Nurse Practitioner

## 2013-08-09 ENCOUNTER — Encounter: Payer: Self-pay | Admitting: Nurse Practitioner

## 2013-08-09 VITALS — BP 142/68 | HR 76 | Ht 62.0 in | Wt 147.0 lb

## 2013-08-09 DIAGNOSIS — G609 Hereditary and idiopathic neuropathy, unspecified: Secondary | ICD-10-CM

## 2013-08-09 DIAGNOSIS — E039 Hypothyroidism, unspecified: Secondary | ICD-10-CM

## 2013-08-09 DIAGNOSIS — L84 Corns and callosities: Secondary | ICD-10-CM | POA: Insufficient documentation

## 2013-08-09 DIAGNOSIS — K5900 Constipation, unspecified: Secondary | ICD-10-CM

## 2013-08-09 DIAGNOSIS — G5793 Unspecified mononeuropathy of bilateral lower limbs: Secondary | ICD-10-CM

## 2013-08-09 DIAGNOSIS — I10 Essential (primary) hypertension: Secondary | ICD-10-CM

## 2013-08-09 DIAGNOSIS — R609 Edema, unspecified: Secondary | ICD-10-CM | POA: Insufficient documentation

## 2013-08-09 DIAGNOSIS — J302 Other seasonal allergic rhinitis: Secondary | ICD-10-CM

## 2013-08-09 DIAGNOSIS — J309 Allergic rhinitis, unspecified: Secondary | ICD-10-CM

## 2013-08-09 NOTE — Assessment & Plan Note (Signed)
Symptomatic controlled on Fexofenadine 60mg daily.    

## 2013-08-09 NOTE — Assessment & Plan Note (Signed)
Trace in ankles and feet. Clear lungs and regular heart beats with HR 70s. Decreased pedis dorsalis pulses, but warm to touch, no pigmentation in BLE.

## 2013-08-09 NOTE — Assessment & Plan Note (Signed)
Controlled w/o antihypertensive agent. Update CMP  

## 2013-08-09 NOTE — Assessment & Plan Note (Signed)
Pain is better controlled on Hydrocodone/ApAp 5/325mg tid(hx of falling associated with Gabapentin use). Pain mainly in back and knees. F/u  Ortho when the patient desires.    

## 2013-08-09 NOTE — Assessment & Plan Note (Signed)
The medical aspect of the R 5th toe--pad to reduce pressure

## 2013-08-09 NOTE — Progress Notes (Signed)
Patient ID: [name], female   DOB: 02/24/1911, 79 y.o.   MRN: [id number] Code Status: DNR  Allergies  Allergen Reactions  . Amoxicillin   . Avelox [Moxifloxacin]   . Erythromycin   . Monistat [Miconazole]   . Morphine And Related     Chief Complaint  Patient presents with  . Medical Managment of Chronic Issues    blood pressure, thyroid, COPD    HPI: Patient is a 79 y.o. female seen in the clinic at [facility] today for edema, corn R 5th toe, blood pressure, COPD, and other chronic medical conditions.  Problem List Items Addressed This Visit   Corn of toe     The medical aspect of the R 5th toe--pad to reduce pressure     Edema     Trace in ankles and feet. Clear lungs and regular heart beats with HR 70s. Decreased pedis dorsalis pulses, but warm to touch, no pigmentation in BLE.      HTN (hypertension)     Controlled w/o antihypertensive agent. Update CMP      Hypothyroidism - Primary     TSH was 13.082 02/15/13--Levothyroxine was increased to subsequently--follow up TSH2.177 03/23/13--update TSH and CBC      Neuropathic pain of both legs     Pain is better controlled on Hydrocodone/ApAp 5/325mg  tid(hx of falling associated with Gabapentin use). Pain mainly in back and knees. F/u  Ortho when the patient desires.       Seasonal allergies     Symptomatic controlled on Fexofenadine 60mg  daily.       Unspecified constipation     No problem on Colace 200mg  daily.          Review of Systems:  Review of Systems  Constitutional: Negative for fever, chills, weight loss, malaise/fatigue and diaphoresis.  HENT: Positive for hearing loss (severe, f/u audiology, recently tube placed in the left ear drum, f/u  ENT next week. ). Negative for ear pain, congestion, sore throat, neck pain and ear discharge.   Eyes: Negative for blurred vision, double vision, photophobia, pain, discharge and redness.  Respiratory: Negative for cough, sputum production  and shortness of breath.   Cardiovascular: Positive for leg swelling (chronic. The patient stated its better than prior. ). Negative for chest pain, palpitations, orthopnea, claudication and PND.  Gastrointestinal: Negative for heartburn, nausea, vomiting, abdominal pain, diarrhea, constipation and blood in stool.  Genitourinary: Positive for frequency. Negative for dysuria, urgency, hematuria and flank pain.  Musculoskeletal: Positive for back pain and joint pain (knees). Negative for myalgias and falls.       Hand cram--warm water bottle helps at night.   Skin: Negative for itching and rash.       The right 5th toe corn-medial aspect.   Neurological: Positive for sensory change (neuropathic pain in legs ). Negative for dizziness, tingling, tremors, speech change, focal weakness, seizures, loss of consciousness and weakness.  Endo/Heme/Allergies: Negative for environmental allergies and polydipsia. Does not bruise/bleed easily.  Psychiatric/Behavioral: Positive for memory loss. Negative for depression and hallucinations. The patient is not nervous/anxious and does not have insomnia.      Past Medical History  Diagnosis Date  . Arthritis   . CHF (congestive heart failure)   . Hypothyroidism   . HTN (hypertension), benign   . H/O: CVA (cerebrovascular accident)   . COPD (chronic obstructive pulmonary disease)   . Unspecified hearing loss 02/08/2013  . Unspecified constipation 11/02/2012  . Candidiasis of other urogenital  sites 08/10/2012  . Insomnia, unspecified 08/10/2012  . Pain in joint, ankle and foot 06/14/2012  . Other and unspecified hyperlipidemia 06/01/2012  . Contusion of face, scalp, and neck except eye(s) 06/01/2012  . Contusion of wrist 06/01/2012  . Edema 04/20/2012  . Open wound of knee, leg (except thigh), and ankle, without mention of complication 04/20/2012  . Closed fracture of sacrum and coccyx without mention of spinal cord injury 02/14/2012  . Unspecified hereditary and  idiopathic peripheral neuropathy 01/27/2012  . Peripheral vascular disease, unspecified 01/27/2012  . Other disorder of coccyx 01/27/2012  . Personal history of fall 01/27/2012  . Ventricular fibrillation 01/24/2012  . Acute kidney failure, unspecified 01/24/2012  . Cerebral embolism 05/23/2005  . Pneumonia, organism unspecified 01/23/2005   Past Surgical History  Procedure Laterality Date  . Abdominal hysterectomy  1990    for endometrial cancer  . Tonsillectomy  1916  . Mastoidectomy  1920    bilateral  . Gum surgery  1932  . Ectopic pregnancy surgery  1952  . Thyroidectomy  1960  . Cataract extraction w/ intraocular lens  implant, bilateral     Social History:   reports that she has never smoked. She has never used smokeless tobacco. She reports that she does not drink alcohol or use illicit drugs.   Medications: Patient's Medications  New Prescriptions   No medications on file  Previous Medications   ACETAMINOPHEN (TYLENOL) 325 MG TABLET    Take 650 mg by mouth every 4 (four) hours as needed for pain. Pain/headache   ASPIRIN 81 MG CHEWABLE TABLET    Chew 81 mg by mouth daily.   B COMPLEX-C (B-COMPLEX WITH VITAMIN C) TABLET    Take 1 tablet by mouth daily.   CALCIUM CITRATE-VITAMIN D (CITRACAL+D) 315-200 MG-UNIT PER TABLET    Take 1 tablet by mouth daily.   DOCUSATE SODIUM (COLACE) 100 MG CAPSULE    Take 200 mg by mouth. Take 2 tablets once a day   FEXOFENADINE (ALLEGRA) 60 MG TABLET    Take 60 mg by mouth daily.   LEVOTHYROXINE (SYNTHROID, LEVOTHROID) 150 MCG TABLET    Take 150 mcg by mouth daily before breakfast.   MULTIPLE VITAMIN (MULTIVITAMIN WITH MINERALS) TABS    Take 1 tablet by mouth daily.   SACCHAROMYCES BOULARDII (FLORASTOR) 250 MG CAPSULE    Take 250 mg by mouth 2 (two) times daily.  Modified Medications   Modified Medication Previous Medication   HYDROCODONE-ACETAMINOPHEN (NORCO/VICODIN) 5-325 MG PER TABLET HYDROcodone-acetaminophen (NORCO/VICODIN) 5-325 MG per  tablet      Take 1 tablet by mouth. Take one tablets three times daily for pain    Take 1 tablet by mouth 2 (two) times daily.  Discontinued Medications   TRAMADOL (ULTRAM) 50 MG TABLET    Take 50 mg by mouth every 6 (six) hours as needed for pain. Pain     Physical Exam: Physical Exam  Constitutional: She is oriented to person, place, and time. She appears well-developed and well-nourished. No distress.  HENT:  Head: Normocephalic and atraumatic.  Right Ear: External ear normal.  Left Ear: External ear normal.  Nose: Nose normal.  Mouth/Throat: Oropharynx is clear and moist. No oropharyngeal exudate.  Left TM tube seen.   Eyes: Conjunctivae and EOM are normal. Pupils are equal, round, and reactive to light. Right eye exhibits no discharge. Left eye exhibits no discharge.  Neck: Normal range of motion. Neck supple. No JVD present. No thyromegaly present.  Cardiovascular: Exam reveals  no gallop and no friction rub.   No murmur heard. Pulmonary/Chest: Effort normal. No respiratory distress. She has rales in the right lower field and the left lower field. She exhibits no tenderness.  Abdominal: Soft. Bowel sounds are normal. She exhibits no distension and no mass. There is no tenderness. There is no guarding. No hernia.  Musculoskeletal: Normal range of motion. She exhibits edema (pedal and ankles. ) and tenderness (c/o lower back and knees pain. ).  Lymphadenopathy:    She has no cervical adenopathy.  Neurological: She is alert and oriented to person, place, and time. She displays normal reflexes. No cranial nerve deficit. Coordination normal.  Skin: Skin is warm and dry. No rash noted. She is not diaphoretic. No erythema. No pallor.  The medial aspect of the right 5th toe corn  Psychiatric: She has a normal mood and affect. Her behavior is normal. Judgment and thought content normal.  Some confusion    Filed Vitals:   08/09/13 1436  BP: 142/68  Pulse: 76  Height: 5\' 2"  (1.575 m)   Weight: 147 lb (66.679 kg)  SpO2: 91%      Labs reviewed: Basic Metabolic Panel:  Recent Labs  40/98/11 2326 01/21/13 0450 01/22/13 0458 01/23/13 03/23/13 05/08/13  NA 131* 131* 133* 136*  --  141  K 3.5 4.2 4.3 4.0  --  4.3  CL 90* 96 101  --   --   --   CO2 26 24 25   --   --   --   GLUCOSE 145* 155* 127*  --   --   --   BUN 23 21 31* 27*  --  24*  CREATININE 0.93 0.74 0.72 0.7  --  0.8  CALCIUM 9.1 8.7 8.3*  --   --   --   TSH 8.775*  --   --   --  2.18  --    Liver Function Tests:  Recent Labs  01/19/13 2326  AST 20  ALT 12  ALKPHOS 56  BILITOT 0.3  PROT 7.2  ALBUMIN 3.3*   CBC:  Recent Labs  01/19/13 2326 01/21/13 0450 01/22/13 0458 01/23/13 05/08/13  WBC 7.6 19.8* 17.7* 11.0 6.6  HGB 15.2* 15.1* 13.5 14.5 13.5  HCT 45.1 44.8 40.5 43 41  MCV 92.6 92.0 92.0  --   --   PLT 142* 157 160 163 153   Past Procedures: 01/18/13 CXR old granulomatous disease, no inflammatory consolidate, mild bibasilar atelectasis, mild cardiomegaly without pulmonary vascualr congestion or pleural effusion.  05/03/13 X-ray C spine: cervical spondylosis.    Assessment/Plan Hypothyroidism TSH was 13.082 02/15/13--Levothyroxine was increased to subsequently--follow up TSH2.177 03/23/13--update TSH and CBC    Unspecified constipation No problem on Colace 200mg  daily.     Seasonal allergies Symptomatic controlled on Fexofenadine 60mg  daily.     Neuropathic pain of both legs Pain is better controlled on Hydrocodone/ApAp 5/325mg  tid(hx of falling associated with Gabapentin use). Pain mainly in back and knees. F/u  Ortho when the patient desires.     HTN (hypertension) Controlled w/o antihypertensive agent. Update CMP    Edema Trace in ankles and feet. Clear lungs and regular heart beats with HR 70s. Decreased pedis dorsalis pulses, but warm to touch, no pigmentation in BLE.    Corn of toe The medical aspect of the R 5th toe--pad to reduce pressure      Family/ Staff Communication: observe the patient.   Goals of Care: AL  Labs/tests ordered: CBC,  CMP, TSH

## 2013-08-09 NOTE — Assessment & Plan Note (Addendum)
TSH was 13.082 02/15/13--Levothyroxine was increased to subsequently--follow up TSH2.177 03/23/13--update TSH and CBC

## 2013-08-09 NOTE — Assessment & Plan Note (Signed)
No problem on Colace 200mg daily.    

## 2013-08-14 LAB — HEPATIC FUNCTION PANEL
ALT: 9 U/L (ref 7–35)
AST: 14 U/L (ref 13–35)
Alkaline Phosphatase: 45 U/L (ref 25–125)

## 2013-08-14 LAB — CBC AND DIFFERENTIAL: Hemoglobin: 13.4 g/dL (ref 12.0–16.0)

## 2013-08-14 LAB — BASIC METABOLIC PANEL
Potassium: 4.5 mmol/L (ref 3.4–5.3)
Sodium: 142 mmol/L (ref 137–147)

## 2013-08-17 ENCOUNTER — Encounter: Payer: Self-pay | Admitting: *Deleted

## 2013-10-18 ENCOUNTER — Encounter: Payer: Self-pay | Admitting: Nurse Practitioner

## 2013-10-18 ENCOUNTER — Non-Acute Institutional Stay: Payer: Medicare PPO | Admitting: Nurse Practitioner

## 2013-10-18 VITALS — BP 126/62 | HR 84 | Ht 62.0 in | Wt 152.0 lb

## 2013-10-18 DIAGNOSIS — K5900 Constipation, unspecified: Secondary | ICD-10-CM

## 2013-10-18 DIAGNOSIS — E039 Hypothyroidism, unspecified: Secondary | ICD-10-CM

## 2013-10-18 DIAGNOSIS — H6123 Impacted cerumen, bilateral: Secondary | ICD-10-CM

## 2013-10-18 DIAGNOSIS — H6120 Impacted cerumen, unspecified ear: Secondary | ICD-10-CM

## 2013-10-18 DIAGNOSIS — L02619 Cutaneous abscess of unspecified foot: Secondary | ICD-10-CM

## 2013-10-18 DIAGNOSIS — L03115 Cellulitis of right lower limb: Secondary | ICD-10-CM

## 2013-10-18 DIAGNOSIS — I10 Essential (primary) hypertension: Secondary | ICD-10-CM

## 2013-10-19 DIAGNOSIS — L03115 Cellulitis of right lower limb: Secondary | ICD-10-CM | POA: Insufficient documentation

## 2013-10-19 DIAGNOSIS — H6123 Impacted cerumen, bilateral: Secondary | ICD-10-CM | POA: Insufficient documentation

## 2013-10-19 NOTE — Assessment & Plan Note (Signed)
Controlled w/o antihypertensive agent.   

## 2013-10-19 NOTE — Assessment & Plan Note (Signed)
Pain, fever, erythema, swelling-will start 10 day course Doxycycline 100mg  bid along with FloraStor bid. She is taking Norco tid for OA pain-not adequate pain relief for her right foot. Will add Celebrex 200mg  daily for 30 days.

## 2013-10-19 NOTE — Assessment & Plan Note (Signed)
Ear wax removed with lavage.

## 2013-10-19 NOTE — Progress Notes (Signed)
Patient ID: [name], female   DOB: July 07, 1911, 79 y.o.   MRN: [id number]  Code Status: DNR  Allergies  Allergen Reactions  . Amoxicillin   . Avelox [Moxifloxacin]   . Erythromycin   . Monistat [Miconazole]   . Morphine And Related     Chief Complaint  Patient presents with  . Medical Managment of Chronic Issues    blood pressure, thyroid, COPD    HPI: Patient is a 79 y.o. female seen in the clinic at [facility] today for edema, right foot erythema/fever/swelling/pain, blood pressure,  and other chronic medical conditions.  Problem List Items Addressed This Visit   Cellulitis of right foot     Pain, fever, erythema, swelling-will start 10 day course Doxycycline 100mg  bid along with FloraStor bid. She is taking Norco tid for OA pain-not adequate pain relief for her right foot. Will add Celebrex 200mg  daily for 30 days.     HTN (hypertension)     Controlled w/o antihypertensive agent.         Hypothyroidism     TSH was 13.082 02/15/13--Levothyroxine was increased to subsequently--follow up TSH2.177 03/23/13--0.473 08/14/13.         Impacted cerumen of both ears - Primary     Ear wax removed with lavage.     Unspecified constipation     No problem on Colace 200mg  daily.            Review of Systems:  Review of Systems  Constitutional: Negative for fever, chills, weight loss, malaise/fatigue and diaphoresis.  HENT: Positive for hearing loss (severe, f/u audiology, recently tube placed in the left ear drum,  hears better after impacted ceruen removed wtih ear lavage. ). Negative for congestion, ear discharge, ear pain and sore throat.   Eyes: Negative for blurred vision, double vision, photophobia, pain, discharge and redness.  Respiratory: Negative for cough, sputum production and shortness of breath.   Cardiovascular: Positive for leg swelling (chronic. The patient stated its better than prior. ). Negative for chest pain, palpitations,  orthopnea, claudication and PND.  Gastrointestinal: Negative for heartburn, nausea, vomiting, abdominal pain, diarrhea, constipation and blood in stool.  Genitourinary: Positive for frequency. Negative for dysuria, urgency, hematuria and flank pain.  Musculoskeletal: Positive for back pain and joint pain (knees). Negative for falls, myalgias and neck pain.       Hand cram--warm water bottle helps at night.   Skin: Negative for itching and rash.       The right 5th toe corn-medial aspect-gone. Entire right foot painful, redness, warmth, swelling.   Neurological: Positive for sensory change (neuropathic pain in legs ). Negative for dizziness, tingling, tremors, speech change, focal weakness, seizures, loss of consciousness and weakness.  Endo/Heme/Allergies: Negative for environmental allergies and polydipsia. Does not bruise/bleed easily.  Psychiatric/Behavioral: Positive for memory loss. Negative for depression and hallucinations. The patient is not nervous/anxious and does not have insomnia.      Past Medical History  Diagnosis Date  . Arthritis   . CHF (congestive heart failure)   . Hypothyroidism   . HTN (hypertension), benign   . H/O: CVA (cerebrovascular accident)   . COPD (chronic obstructive pulmonary disease)   . Unspecified hearing loss 02/08/2013  . Unspecified constipation 11/02/2012  . Candidiasis of other urogenital sites 08/10/2012  . Insomnia, unspecified 08/10/2012  . Pain in joint, ankle and foot 06/14/2012  . Other and unspecified hyperlipidemia 06/01/2012  . Contusion of face, scalp, and neck except eye(s) 06/01/2012  .  Contusion of wrist 06/01/2012  . Edema 04/20/2012  . Open wound of knee, leg (except thigh), and ankle, without mention of complication 04/20/2012  . Closed fracture of sacrum and coccyx without mention of spinal cord injury 02/14/2012  . Unspecified hereditary and idiopathic peripheral neuropathy 01/27/2012  . Peripheral vascular disease, unspecified 01/27/2012   . Other disorder of coccyx 01/27/2012  . Personal history of fall 01/27/2012  . Ventricular fibrillation 01/24/2012  . Acute kidney failure, unspecified 01/24/2012  . Cerebral embolism 05/23/2005  . Pneumonia, organism unspecified 01/23/2005   Past Surgical History  Procedure Laterality Date  . Abdominal hysterectomy  1990    for endometrial cancer  . Tonsillectomy  1916  . Mastoidectomy  1920    bilateral  . Gum surgery  1932  . Ectopic pregnancy surgery  1952  . Thyroidectomy  1960  . Cataract extraction w/ intraocular lens  implant, bilateral     Social History:   reports that she has never smoked. She has never used smokeless tobacco. She reports that she does not drink alcohol or use illicit drugs.   Medications: Patient's Medications  New Prescriptions   No medications on file  Previous Medications   ACETAMINOPHEN (TYLENOL) 325 MG TABLET    Take 650 mg by mouth every 4 (four) hours as needed for pain. Pain/headache   ASPIRIN 81 MG CHEWABLE TABLET    Chew 81 mg by mouth daily.   B COMPLEX-C (B-COMPLEX WITH VITAMIN C) TABLET    Take 1 tablet by mouth daily.   CALCIUM CITRATE-VITAMIN D (CITRACAL+D) 315-200 MG-UNIT PER TABLET    Take 1 tablet by mouth daily.   DOCUSATE SODIUM (COLACE) 100 MG CAPSULE    Take 200 mg by mouth. Take 2 tablets once a day   FEXOFENADINE (ALLEGRA) 60 MG TABLET    Take 60 mg by mouth daily.   HYDROCODONE-ACETAMINOPHEN (NORCO/VICODIN) 5-325 MG PER TABLET    Take 1 tablet by mouth. Take one tablets three times daily for pain   LEVOTHYROXINE (SYNTHROID, LEVOTHROID) 150 MCG TABLET    Take 150 mcg by mouth daily before breakfast.   SACCHAROMYCES BOULARDII (FLORASTOR) 250 MG CAPSULE    Take 250 mg by mouth 2 (two) times daily.  Modified Medications   No medications on file  Discontinued Medications   CLOTRIMAZOLE-BETAMETHASONE (LOTRISONE) CREAM    Apply 1 application topically 2 (two) times daily as needed. Apply to affected areas twice daily as needed for  itchy rash.   MULTIPLE VITAMIN (MULTIVITAMIN WITH MINERALS) TABS    Take 1 tablet by mouth daily.   NYSTATIN (MYCOSTATIN/NYSTOP) 100000 UNIT/GM POWD    Apply topically 2 (two) times daily. Apply topically to affected area of breast and groin twice daily as needed.     Physical Exam: Physical Exam  Constitutional: She is oriented to person, place, and time. She appears well-developed and well-nourished. No distress.  HENT:  Head: Normocephalic and atraumatic.  Right Ear: External ear normal.  Left Ear: External ear normal.  Nose: Nose normal.  Mouth/Throat: Oropharynx is clear and moist. No oropharyngeal exudate.  Left TM tube seen.   Eyes: Conjunctivae and EOM are normal. Pupils are equal, round, and reactive to light. Right eye exhibits no discharge. Left eye exhibits no discharge.  Neck: Normal range of motion. Neck supple. No JVD present. No thyromegaly present.  Cardiovascular: Exam reveals no gallop and no friction rub.   No murmur heard. Pulmonary/Chest: Effort normal. No respiratory distress. She has rales in the  right lower field and the left lower field. She exhibits no tenderness.  Abdominal: Soft. Bowel sounds are normal. She exhibits no distension and no mass. There is no tenderness. There is no guarding. No hernia.  Musculoskeletal: Normal range of motion. She exhibits edema (pedal and ankles. ) and tenderness (c/o lower back and knees pain. ).  Lymphadenopathy:    She has no cervical adenopathy.  Neurological: She is alert and oriented to person, place, and time. She displays normal reflexes. No cranial nerve deficit. Coordination normal.  Skin: Skin is warm and dry. No rash noted. She is not diaphoretic. No erythema. No pallor.   Entire right foot painful, redness, warmth, swelling.    Psychiatric: She has a normal mood and affect. Her behavior is normal. Judgment and thought content normal.  Some confusion    Filed Vitals:   10/18/13 1513  BP: 126/62  Pulse: 84   Height: 5\' 2"  (1.575 m)  Weight: 152 lb (68.947 kg)      Labs reviewed: Basic Metabolic Panel:  Recent Labs  24/40/10 2326 01/21/13 0450 01/22/13 0458 01/23/13 03/23/13 05/08/13 08/14/13  NA 131* 131* 133* 136*  --  141 142  K 3.5 4.2 4.3 4.0  --  4.3 4.5  CL 90* 96 101  --   --   --   --   CO2 26 24 25   --   --   --   --   GLUCOSE 145* 155* 127*  --   --   --   --   BUN 23 21 31* 27*  --  24* 23*  CREATININE 0.93 0.74 0.72 0.7  --  0.8 0.9  CALCIUM 9.1 8.7 8.3*  --   --   --   --   TSH 8.775*  --   --   --  2.18  --  0.47   Liver Function Tests:  Recent Labs  01/19/13 2326 08/14/13  AST 20 14  ALT 12 9  ALKPHOS 56 45  BILITOT 0.3  --   PROT 7.2  --   ALBUMIN 3.3*  --    CBC:  Recent Labs  01/19/13 2326 01/21/13 0450 01/22/13 0458 01/23/13 05/08/13 08/14/13  WBC 7.6 19.8* 17.7* 11.0 6.6 5.9  HGB 15.2* 15.1* 13.5 14.5 13.5 13.4  HCT 45.1 44.8 40.5 43 41 40  MCV 92.6 92.0 92.0  --   --   --   PLT 142* 157 160 163 153 172   Past Procedures: 01/18/13 CXR old granulomatous disease, no inflammatory consolidate, mild bibasilar atelectasis, mild cardiomegaly without pulmonary vascualr congestion or pleural effusion.  05/03/13 X-ray C spine: cervical spondylosis.    Assessment/Plan Impacted cerumen of both ears Ear wax removed with lavage.   Hypothyroidism TSH was 13.082 02/15/13--Levothyroxine was increased to subsequently--follow up TSH2.177 03/23/13--0.473 08/14/13.       Cellulitis of right foot Pain, fever, erythema, swelling-will start 10 day course Doxycycline 100mg  bid along with FloraStor bid. She is taking Norco tid for OA pain-not adequate pain relief for her right foot. Will add Celebrex 200mg  daily for 30 days.   HTN (hypertension) Controlled w/o antihypertensive agent.       Unspecified constipation No problem on Colace 200mg  daily.         Family/ Staff Communication: observe the patient.   Goals of Care: AL  Labs/tests  ordered: none

## 2013-10-19 NOTE — Assessment & Plan Note (Signed)
No problem on Colace 200mg daily.    

## 2013-10-19 NOTE — Assessment & Plan Note (Signed)
TSH was 13.082 02/15/13--Levothyroxine was increased to subsequently--follow up TSH2.177 03/23/13--0.473 08/14/13.

## 2013-12-14 ENCOUNTER — Other Ambulatory Visit: Payer: Self-pay | Admitting: *Deleted

## 2013-12-14 MED ORDER — HYDROCODONE-ACETAMINOPHEN 5-325 MG PO TABS: 1.0000 | ORAL_TABLET | Freq: Three times a day (TID) | ORAL | Status: DC

## 2014-02-07 ENCOUNTER — Encounter: Payer: Self-pay | Admitting: Nurse Practitioner

## 2014-02-21 ENCOUNTER — Encounter: Payer: Self-pay | Admitting: Nurse Practitioner

## 2014-02-21 ENCOUNTER — Non-Acute Institutional Stay: Payer: Medicare PPO | Admitting: Nurse Practitioner

## 2014-02-21 VITALS — BP 152/86 | HR 60 | Wt 151.0 lb

## 2014-02-21 DIAGNOSIS — L02619 Cutaneous abscess of unspecified foot: Secondary | ICD-10-CM

## 2014-02-21 DIAGNOSIS — R350 Frequency of micturition: Secondary | ICD-10-CM

## 2014-02-21 DIAGNOSIS — E039 Hypothyroidism, unspecified: Secondary | ICD-10-CM

## 2014-02-21 DIAGNOSIS — I10 Essential (primary) hypertension: Secondary | ICD-10-CM

## 2014-02-21 DIAGNOSIS — R609 Edema, unspecified: Secondary | ICD-10-CM

## 2014-02-21 DIAGNOSIS — L03119 Cellulitis of unspecified part of limb: Secondary | ICD-10-CM

## 2014-02-21 DIAGNOSIS — L03115 Cellulitis of right lower limb: Secondary | ICD-10-CM

## 2014-02-21 NOTE — Progress Notes (Signed)
Patient ID: [name], female   DOB: Aug 19, 1911, 80 y.o.   MRN: [id number] Patient ID: [name], female   DOB: 01/09/1911, 80 y.o.   MRN: [id number]  Code Status: DNR  Allergies  Allergen Reactions  . Amoxicillin   . Avelox [Moxifloxacin]   . Erythromycin   . Monistat [Miconazole]   . Morphine And Related     No chief complaint on file.   HPI: Patient is a 80 y.o. female seen in the clinic at [facility] today for edema, right foot erythema/fever/swelling/pain, blood pressure,  and other chronic medical conditions.  Problem List Items Addressed This Visit   None      Review of Systems:  Review of Systems  Constitutional: Negative for fever, chills, weight loss, malaise/fatigue and diaphoresis.  HENT: Positive for hearing loss (severe, f/u audiology, recently tube placed in the left ear drum,  hears better after impacted ceruen removed wtih ear lavage. ). Negative for congestion, ear discharge, ear pain and sore throat.   Eyes: Negative for blurred vision, double vision, photophobia, pain, discharge and redness.  Respiratory: Negative for cough, sputum production and shortness of breath.   Cardiovascular: Positive for leg swelling (chronic. The patient stated its better than prior. ). Negative for chest pain, palpitations, orthopnea, claudication and PND.  Gastrointestinal: Negative for heartburn, nausea, vomiting, abdominal pain, diarrhea, constipation and blood in stool.  Genitourinary: Positive for frequency. Negative for dysuria, urgency, hematuria and flank pain.  Musculoskeletal: Positive for back pain and joint pain (knees). Negative for falls, myalgias and neck pain.       Hand cram--warm water bottle helps at night.   Skin: Negative for itching and rash.       The right 5th toe corn-medial aspect-gone. Entire right foot painful, redness, warmth, swelling.   Neurological: Positive for sensory change (neuropathic pain in legs ). Negative for  dizziness, tingling, tremors, speech change, focal weakness, seizures, loss of consciousness and weakness.  Endo/Heme/Allergies: Negative for environmental allergies and polydipsia. Does not bruise/bleed easily.  Psychiatric/Behavioral: Positive for memory loss. Negative for depression and hallucinations. The patient is not nervous/anxious and does not have insomnia.      Past Medical History  Diagnosis Date  . Arthritis   . CHF (congestive heart failure)   . Hypothyroidism   . HTN (hypertension), benign   . H/O: CVA (cerebrovascular accident)   . COPD (chronic obstructive pulmonary disease)   . Unspecified hearing loss 02/08/2013  . Unspecified constipation 11/02/2012  . Candidiasis of other urogenital sites 08/10/2012  . Insomnia, unspecified 08/10/2012  . Pain in joint, ankle and foot 06/14/2012  . Other and unspecified hyperlipidemia 06/01/2012  . Contusion of face, scalp, and neck except eye(s) 06/01/2012  . Contusion of wrist 06/01/2012  . Edema 04/20/2012  . Open wound of knee, leg (except thigh), and ankle, without mention of complication 04/20/2012  . Closed fracture of sacrum and coccyx without mention of spinal cord injury 02/14/2012  . Unspecified hereditary and idiopathic peripheral neuropathy 01/27/2012  . Peripheral vascular disease, unspecified 01/27/2012  . Other disorder of coccyx 01/27/2012  . Personal history of fall 01/27/2012  . Ventricular fibrillation 01/24/2012  . Acute kidney failure, unspecified 01/24/2012  . Cerebral embolism 05/23/2005  . Pneumonia, organism unspecified 01/23/2005   Past Surgical History  Procedure Laterality Date  . Abdominal hysterectomy  1990    for endometrial cancer  . Tonsillectomy  1916  . Mastoidectomy  1920    bilateral  .  Gum surgery  1932  . Ectopic pregnancy surgery  1952  . Thyroidectomy  1960  . Cataract extraction w/ intraocular lens  implant, bilateral     Social History:   reports that she has never smoked. She has never used  smokeless tobacco. She reports that she does not drink alcohol or use illicit drugs.   Medications: Patient's Medications  New Prescriptions   No medications on file  Previous Medications   ACETAMINOPHEN (TYLENOL) 325 MG TABLET    Take 650 mg by mouth every 4 (four) hours as needed for pain. Pain/headache   ASPIRIN 81 MG CHEWABLE TABLET    Chew 81 mg by mouth daily.   B COMPLEX-C (B-COMPLEX WITH VITAMIN C) TABLET    Take 1 tablet by mouth daily.   CALCIUM CITRATE-VITAMIN D (CITRACAL+D) 315-200 MG-UNIT PER TABLET    Take 1 tablet by mouth daily.   DOCUSATE SODIUM (COLACE) 100 MG CAPSULE    Take 200 mg by mouth. Take 2 tablets once a day   FEXOFENADINE (ALLEGRA) 60 MG TABLET    Take 60 mg by mouth daily.   HYDROCODONE-ACETAMINOPHEN (NORCO/VICODIN) 5-325 MG PER TABLET    Take 1 tablet by mouth 3 (three) times daily.   LEVOTHYROXINE (SYNTHROID, LEVOTHROID) 150 MCG TABLET    Take 150 mcg by mouth daily before breakfast.   SACCHAROMYCES BOULARDII (FLORASTOR) 250 MG CAPSULE    Take 250 mg by mouth 2 (two) times daily.  Modified Medications   No medications on file  Discontinued Medications   No medications on file     Physical Exam: Physical Exam  Constitutional: She is oriented to person, place, and time. She appears well-developed and well-nourished. No distress.  HENT:  Head: Normocephalic and atraumatic.  Right Ear: External ear normal.  Left Ear: External ear normal.  Nose: Nose normal.  Mouth/Throat: Oropharynx is clear and moist. No oropharyngeal exudate.  Left TM tube seen.   Eyes: Conjunctivae and EOM are normal. Pupils are equal, round, and reactive to light. Right eye exhibits no discharge. Left eye exhibits no discharge.  Neck: Normal range of motion. Neck supple. No JVD present. No thyromegaly present.  Cardiovascular: Exam reveals no gallop and no friction rub.   No murmur heard. Pulmonary/Chest: Effort normal. No respiratory distress. She has rales in the right lower  field and the left lower field. She exhibits no tenderness.  Abdominal: Soft. Bowel sounds are normal. She exhibits no distension and no mass. There is no tenderness. There is no guarding. No hernia.  Musculoskeletal: Normal range of motion. She exhibits edema (pedal and ankles. ) and tenderness (c/o lower back and knees pain. ).  Lymphadenopathy:    She has no cervical adenopathy.  Neurological: She is alert and oriented to person, place, and time. She displays normal reflexes. No cranial nerve deficit. Coordination normal.  Skin: Skin is warm and dry. No rash noted. She is not diaphoretic. No erythema. No pallor.   Entire right foot painful, redness, warmth, swelling.    Psychiatric: She has a normal mood and affect. Her behavior is normal. Judgment and thought content normal.  Some confusion    There were no vitals filed for this visit.    Labs reviewed: Basic Metabolic Panel:  Recent Labs  16/09/9603/11/14 05/08/13 08/14/13  NA  --  141 142  K  --  4.3 4.5  BUN  --  24* 23*  CREATININE  --  0.8 0.9  TSH 2.18  --  0.47  Liver Function Tests:  Recent Labs  08/14/13  AST 14  ALT 9  ALKPHOS 45   CBC:  Recent Labs  05/08/13 08/14/13  WBC 6.6 5.9  HGB 13.5 13.4  HCT 41 40  PLT 153 172   Past Procedures: 01/18/13 CXR old granulomatous disease, no inflammatory consolidate, mild bibasilar atelectasis, mild cardiomegaly without pulmonary vascualr congestion or pleural effusion.  05/03/13 X-ray C spine: cervical spondylosis.    Assessment/Plan No problem-specific assessment & plan notes found for this encounter.   Family/ Staff Communication: observe the patient.   Goals of Care: AL  Labs/tests ordered: CBC, CMP, TSH, Clean catch UA C/S

## 2014-02-24 DIAGNOSIS — R350 Frequency of micturition: Secondary | ICD-10-CM | POA: Insufficient documentation

## 2014-02-24 HISTORY — DX: Frequency of micturition: R35.0

## 2014-02-24 NOTE — Assessment & Plan Note (Addendum)
TSH was 13.082 02/15/13--Levothyroxine was increased to 150mcg subsequently--follow up TSH2.177 03/23/13--0.473 08/14/13. Update TSH

## 2014-02-24 NOTE — Assessment & Plan Note (Signed)
race in ankles and feet. Clear lungs and regular heart beats with HR 70s. Decreased pedis dorsalis pulses, but warm to touch, no pigmentation in BLE.

## 2014-02-24 NOTE — Assessment & Plan Note (Addendum)
UA C/S and CBC

## 2014-02-24 NOTE — Assessment & Plan Note (Signed)
Controlled w/o antihypertensive agent. Update CMP

## 2014-02-24 NOTE — Assessment & Plan Note (Signed)
ain, fever, erythema, swelling-healed after 10 day course Doxycycline 100mg  bid along with FloraStor bid and Celebrex 200mg  daily for 30 days.   She is taking Norco tid for OA pain

## 2014-02-26 LAB — BASIC METABOLIC PANEL
BUN: 23 mg/dL — AB (ref 4–21)
CREATININE: 0.8 mg/dL (ref 0.5–1.1)
Glucose: 93 mg/dL
Potassium: 4.3 mmol/L (ref 3.4–5.3)
Sodium: 141 mmol/L (ref 137–147)

## 2014-02-26 LAB — CBC AND DIFFERENTIAL
HEMATOCRIT: 42 % (ref 36–46)
HEMOGLOBIN: 14.1 g/dL (ref 12.0–16.0)
PLATELETS: 169 10*3/uL (ref 150–399)
WBC: 5.6 10^3/mL

## 2014-02-26 LAB — HEPATIC FUNCTION PANEL
ALK PHOS: 50 U/L (ref 25–125)
ALT: 10 U/L (ref 7–35)
AST: 15 U/L (ref 13–35)
Bilirubin, Total: 0.4 mg/dL

## 2014-02-26 LAB — TSH: TSH: 0.27 u[IU]/mL — AB (ref <=5.90)

## 2014-02-28 ENCOUNTER — Encounter: Payer: Self-pay | Admitting: Nurse Practitioner

## 2014-02-28 ENCOUNTER — Non-Acute Institutional Stay: Payer: Medicare PPO | Admitting: Nurse Practitioner

## 2014-02-28 DIAGNOSIS — G5793 Unspecified mononeuropathy of bilateral lower limbs: Secondary | ICD-10-CM

## 2014-02-28 DIAGNOSIS — R609 Edema, unspecified: Secondary | ICD-10-CM

## 2014-02-28 DIAGNOSIS — J302 Other seasonal allergic rhinitis: Secondary | ICD-10-CM

## 2014-02-28 DIAGNOSIS — J309 Allergic rhinitis, unspecified: Secondary | ICD-10-CM

## 2014-02-28 DIAGNOSIS — G609 Hereditary and idiopathic neuropathy, unspecified: Secondary | ICD-10-CM

## 2014-02-28 DIAGNOSIS — E039 Hypothyroidism, unspecified: Secondary | ICD-10-CM

## 2014-02-28 DIAGNOSIS — K5900 Constipation, unspecified: Secondary | ICD-10-CM

## 2014-02-28 NOTE — Assessment & Plan Note (Signed)
trace in ankles and feet. Clear lungs and regular heart beats with HR 70s. Decreased pedis dorsalis pulses, but warm to touch, no pigmentation in BLE.      

## 2014-02-28 NOTE — Progress Notes (Signed)
Patient ID: [name], female   DOB: Aug 10, 1911, 80 y.o.   MRN: [id number]  Code Status: DNR  Allergies  Allergen Reactions  . Amoxicillin   . Avelox [Moxifloxacin]   . Erythromycin   . Monistat [Miconazole]   . Morphine And Related     Chief Complaint  Patient presents with  . Medical Managment of Chronic Issues  . Acute Visit    thyroid.     HPI: Patient is a 80 y.o. female seen in the AL at [facility] today for hypothyroidism  and other chronic medical conditions.  Problem List Items Addressed This Visit   Hypothyroidism - Primary     SH was 13.082 02/15/13--Levothyroxine was increased to subsequently--follow up TSH2.177 03/23/13--0.473 08/14/13-0.269 02/26/14-trending down-will decrease Levothyroxine to po daily, update TSH     Relevant Medications      levothyroxine (SYNTHROID, LEVOTHROID) 125 MCG tablet   Unspecified constipation     No problem on Colace 200mg  daily.      Seasonal allergies     Symptomatic controlled on Fexofenadine 60mg  daily.       Neuropathic pain of both legs     Pain is better controlled on Hydrocodone/ApAp 5/325mg  tid(hx of falling associated with Gabapentin use). Pain mainly in back and knees. F/u  Ortho when the patient desires.      Edema     trace in ankles and feet. Clear lungs and regular heart beats with HR 70s. Decreased pedis dorsalis pulses, but warm to touch, no pigmentation in BLE.           Review of Systems:  Review of Systems  Constitutional: Negative for fever, chills, weight loss, malaise/fatigue and diaphoresis.  HENT: Positive for hearing loss (severe, f/u audiology, recently tube placed in the left ear drum,  hears better after impacted ceruen removed wtih ear lavage. ). Negative for congestion, ear discharge, ear pain and sore throat.   Eyes: Negative for blurred vision, double vision, photophobia, pain, discharge and redness.  Respiratory: Negative for cough, sputum production and  shortness of breath.   Cardiovascular: Positive for leg swelling (chronic. The patient stated its better than prior. ). Negative for chest pain, palpitations, orthopnea, claudication and PND.  Gastrointestinal: Negative for heartburn, nausea, vomiting, abdominal pain, diarrhea, constipation and blood in stool.  Genitourinary: Positive for frequency. Negative for dysuria, urgency, hematuria and flank pain.  Musculoskeletal: Positive for back pain and joint pain (knees). Negative for falls, myalgias and neck pain.       Hand cram--warm water bottle helps at night.   Skin: Negative for itching and rash.  Neurological: Positive for sensory change (neuropathic pain in legs ). Negative for dizziness, tingling, tremors, speech change, focal weakness, seizures, loss of consciousness and weakness.  Endo/Heme/Allergies: Negative for environmental allergies and polydipsia. Does not bruise/bleed easily.  Psychiatric/Behavioral: Positive for memory loss. Negative for depression and hallucinations. The patient is not nervous/anxious and does not have insomnia.      Past Medical History  Diagnosis Date  . Arthritis   . CHF (congestive heart failure)   . Hypothyroidism   . HTN (hypertension), benign   . H/O: CVA (cerebrovascular accident)   . COPD (chronic obstructive pulmonary disease)   . Unspecified hearing loss 02/08/2013  . Unspecified constipation 11/02/2012  . Candidiasis of other urogenital sites 08/10/2012  . Insomnia, unspecified 08/10/2012  . Pain in joint, ankle and foot 06/14/2012  . Other and unspecified hyperlipidemia 06/01/2012  . Contusion of face,  scalp, and neck except eye(s) 06/01/2012  . Contusion of wrist 06/01/2012  . Edema 04/20/2012  . Open wound of knee, leg (except thigh), and ankle, without mention of complication 04/20/2012  . Closed fracture of sacrum and coccyx without mention of spinal cord injury 02/14/2012  . Unspecified hereditary and idiopathic peripheral neuropathy 01/27/2012   . Peripheral vascular disease, unspecified 01/27/2012  . Other disorder of coccyx 01/27/2012  . Personal history of fall 01/27/2012  . Ventricular fibrillation 01/24/2012  . Acute kidney failure, unspecified 01/24/2012  . Cerebral embolism 05/23/2005  . Pneumonia, organism unspecified 01/23/2005   Past Surgical History  Procedure Laterality Date  . Abdominal hysterectomy  1990    for endometrial cancer  . Tonsillectomy  1916  . Mastoidectomy  1920    bilateral  . Gum surgery  1932  . Ectopic pregnancy surgery  1952  . Thyroidectomy  1960  . Cataract extraction w/ intraocular lens  implant, bilateral     Social History:   reports that she has never smoked. She has never used smokeless tobacco. She reports that she does not drink alcohol or use illicit drugs.   Medications: Patient's Medications  New Prescriptions   No medications on file  Previous Medications   ACETAMINOPHEN (TYLENOL) 325 MG TABLET    Take 650 mg by mouth every 4 (four) hours as needed for pain. Pain/headache   ASPIRIN 81 MG CHEWABLE TABLET    Chew 81 mg by mouth daily.   B COMPLEX-C (B-COMPLEX WITH VITAMIN C) TABLET    Take 1 tablet by mouth daily.   CALCIUM CITRATE-VITAMIN D (CITRACAL+D) 315-200 MG-UNIT PER TABLET    Take 1 tablet by mouth daily.   DOCUSATE SODIUM (COLACE) 100 MG CAPSULE    Take 200 mg by mouth. Take 2 tablets once a day   FEXOFENADINE (ALLEGRA) 60 MG TABLET    Take 60 mg by mouth daily.   HYDROCODONE-ACETAMINOPHEN (NORCO/VICODIN) 5-325 MG PER TABLET    Take 1 tablet by mouth 3 (three) times daily.   LEVOTHYROXINE (SYNTHROID, LEVOTHROID) 125 MCG TABLET    Take 125 mcg by mouth daily before breakfast.  Modified Medications   No medications on file  Discontinued Medications   LEVOTHYROXINE (SYNTHROID, LEVOTHROID) 150 MCG TABLET    Take 150 mcg by mouth daily before breakfast.     Physical Exam: Physical Exam  Constitutional: She is oriented to person, place, and time. She appears  well-developed and well-nourished. No distress.  HENT:  Head: Normocephalic and atraumatic.  Right Ear: External ear normal.  Left Ear: External ear normal.  Nose: Nose normal.  Mouth/Throat: Oropharynx is clear and moist. No oropharyngeal exudate.  Left TM tube seen.   Eyes: Conjunctivae and EOM are normal. Pupils are equal, round, and reactive to light. Right eye exhibits no discharge. Left eye exhibits no discharge.  Neck: Normal range of motion. Neck supple. No JVD present. No thyromegaly present.  Cardiovascular: Exam reveals no gallop and no friction rub.   No murmur heard. Pulmonary/Chest: Effort normal. No respiratory distress. She has rales in the right lower field and the left lower field. She exhibits no tenderness.  Abdominal: Soft. Bowel sounds are normal. She exhibits no distension and no mass. There is no tenderness. There is no guarding. No hernia.  Musculoskeletal: Normal range of motion. She exhibits edema (pedal and ankles. ) and tenderness (c/o lower back and knees pain. ).  Lymphadenopathy:    She has no cervical adenopathy.  Neurological: She is  alert and oriented to person, place, and time. She displays normal reflexes. No cranial nerve deficit. Coordination normal.  Skin: Skin is warm and dry. No rash noted. She is not diaphoretic. No erythema. No pallor.     Psychiatric: She has a normal mood and affect. Her behavior is normal. Judgment and thought content normal.  Some confusion    Filed Vitals:   02/28/14 1347  BP: 119/69  Pulse: 77  Temp: 98.3 F (36.8 C)  TempSrc: Tympanic  Resp: 20      Labs reviewed: Basic Metabolic Panel:  Recent Labs  04/54/903/10/27 05/08/13 08/14/13 02/26/14  NA  --  141 142 141  K  --  4.3 4.5 4.3  BUN  --  24* 23* 23*  CREATININE  --  0.8 0.9 0.8  TSH 2.18  --  0.47 0.27*   Liver Function Tests:  Recent Labs  08/14/13 02/26/14  AST 14 15  ALT 9 10  ALKPHOS 45 50   CBC:  Recent Labs  05/08/13 08/14/13 02/26/14   WBC 6.6 5.9 5.6  HGB 13.5 13.4 14.1  HCT 41 40 42  PLT 153 172 169   Past Procedures: 01/18/13 CXR old granulomatous disease, no inflammatory consolidate, mild bibasilar atelectasis, mild cardiomegaly without pulmonary vascualr congestion or pleural effusion.  05/03/13 X-ray C spine: cervical spondylosis.    Assessment/Plan Hypothyroidism SH was 13.082 02/15/13--Levothyroxine was increased to 150mcg subsequently--follow up TSH2.177 03/23/13--0.473 08/14/13-0.269 02/26/14-trending down-will decrease Levothyroxine to 125mcg po daily, update TSH   Seasonal allergies Symptomatic controlled on Fexofenadine 60mg  daily.     Unspecified constipation No problem on Colace 200mg  daily.    Neuropathic pain of both legs Pain is better controlled on Hydrocodone/ApAp 5/325mg  tid(hx of falling associated with Gabapentin use). Pain mainly in back and knees. F/u  Ortho when the patient desires.    Edema trace in ankles and feet. Clear lungs and regular heart beats with HR 70s. Decreased pedis dorsalis pulses, but warm to touch, no pigmentation in BLE.        Family/ Staff Communication: observe the patient.   Goals of Care: AL  Labs/tests ordered: none

## 2014-02-28 NOTE — Assessment & Plan Note (Signed)
Symptomatic controlled on Fexofenadine 60mg  daily.

## 2014-02-28 NOTE — Assessment & Plan Note (Signed)
No problem on Colace 200mg daily.    

## 2014-02-28 NOTE — Assessment & Plan Note (Signed)
SH was 13.082 02/15/13--Levothyroxine was increased to 150mcg subsequently--follow up TSH2.177 03/23/13--0.473 08/14/13-0.269 02/26/14-trending down-will decrease Levothyroxine to 125mcg po daily, update TSH

## 2014-02-28 NOTE — Assessment & Plan Note (Signed)
Pain is better controlled on Hydrocodone/ApAp 5/325mg tid(hx of falling associated with Gabapentin use). Pain mainly in back and knees. F/u  Ortho when the patient desires.    

## 2014-05-21 LAB — TSH: TSH: 2.31 u[IU]/mL (ref 0.41–5.90)

## 2014-05-23 ENCOUNTER — Other Ambulatory Visit: Payer: Self-pay | Admitting: Nurse Practitioner

## 2014-05-23 DIAGNOSIS — E039 Hypothyroidism, unspecified: Secondary | ICD-10-CM

## 2014-07-10 ENCOUNTER — Other Ambulatory Visit: Payer: Self-pay | Admitting: *Deleted

## 2014-07-10 MED ORDER — HYDROCODONE-ACETAMINOPHEN 5-325 MG PO TABS: ORAL_TABLET | ORAL | Status: DC

## 2014-08-15 ENCOUNTER — Other Ambulatory Visit: Payer: Self-pay | Admitting: Nurse Practitioner

## 2014-08-15 ENCOUNTER — Encounter: Payer: Self-pay | Admitting: Internal Medicine

## 2014-08-20 ENCOUNTER — Other Ambulatory Visit: Payer: Self-pay | Admitting: *Deleted

## 2014-08-20 MED ORDER — HYDROCODONE-ACETAMINOPHEN 5-325 MG PO TABS: ORAL_TABLET | ORAL | Status: DC

## 2014-08-20 NOTE — Telephone Encounter (Signed)
[facility] [place] 

## 2014-08-20 NOTE — Telephone Encounter (Signed)
[facility] [place] 

## 2014-09-26 ENCOUNTER — Non-Acute Institutional Stay: Payer: Medicare PPO | Admitting: Internal Medicine

## 2014-09-26 VITALS — BP 186/94 | HR 80 | Temp 97.0°F | Ht 62.0 in | Wt 159.0 lb

## 2014-09-26 DIAGNOSIS — H9190 Unspecified hearing loss, unspecified ear: Secondary | ICD-10-CM

## 2014-09-26 DIAGNOSIS — I872 Venous insufficiency (chronic) (peripheral): Secondary | ICD-10-CM

## 2014-09-26 DIAGNOSIS — R2681 Unsteadiness on feet: Secondary | ICD-10-CM | POA: Insufficient documentation

## 2014-09-26 DIAGNOSIS — I00 Rheumatic fever without heart involvement: Secondary | ICD-10-CM

## 2014-09-26 DIAGNOSIS — Z0000 Encounter for general adult medical examination without abnormal findings: Secondary | ICD-10-CM

## 2014-09-26 DIAGNOSIS — E039 Hypothyroidism, unspecified: Secondary | ICD-10-CM

## 2014-09-26 DIAGNOSIS — M2550 Pain in unspecified joint: Secondary | ICD-10-CM

## 2014-09-26 DIAGNOSIS — R609 Edema, unspecified: Secondary | ICD-10-CM

## 2014-09-26 DIAGNOSIS — I8312 Varicose veins of left lower extremity with inflammation: Secondary | ICD-10-CM

## 2014-09-26 DIAGNOSIS — I8311 Varicose veins of right lower extremity with inflammation: Secondary | ICD-10-CM

## 2014-09-26 DIAGNOSIS — I10 Essential (primary) hypertension: Secondary | ICD-10-CM

## 2014-09-26 DIAGNOSIS — H9193 Unspecified hearing loss, bilateral: Secondary | ICD-10-CM

## 2014-09-26 HISTORY — DX: Unspecified hearing loss, unspecified ear: H91.90

## 2014-09-26 HISTORY — DX: Pain in unspecified joint: M25.50

## 2014-09-26 HISTORY — DX: Venous insufficiency (chronic) (peripheral): I87.2

## 2014-09-26 NOTE — Progress Notes (Signed)
Patient ID: Tracie Mooreugenia E Norman, female   DOB: October 21, 1911, [age] y.o.   MRN: [id number]    HISTORY AND PHYSICAL  Location:  [facility] Room Number: 923 Place of Service: Clinic (12)   Extended Emergency Contact Information Primary Emergency Contact: Rojelio Brennererkins,Samuel  [place] Home Phone: [phone] Relation: Son   Advanced Directive information Does patient have an advance directive?: Yes, Type of Advance Directive: Healthcare Power of [place];Living will;Out of facility DNR (pink MOST or yellow form), Pre-existing out of facility DNR order (yellow form or pink MOST form): Yellow form placed in chart (order not valid for inpatient use)    Chief Complaint  Patient presents with  . Annual Exam    Comprehensive Exam: blood pressure, thyroid, CHF  . Foot Burn    both feet    HPI:   Essential hypertension: significantly elevated today  Edema: chronic and bipedal  Hypothyroidism, unspecified hypothyroidism type: controlled  Gait instability: using walker  Arthralgia: diffuse  Stasis dermatitis of both legs: chronic venous stasis changes. Feet turn red in dependent position.  Rheumatic fever: remote history at age 80  Hearing loss, bilateral: interferes with communication at times.    Past Medical History  Diagnosis Date  . Arthritis   . CHF (congestive heart failure)   . Hypothyroidism   . HTN (hypertension), benign   . H/O: CVA (cerebrovascular accident)   . COPD (chronic obstructive pulmonary disease)   . Unspecified hearing loss 02/08/2013  . Unspecified constipation 11/02/2012  . Candidiasis of other urogenital sites 08/10/2012  . Insomnia, unspecified 08/10/2012  . Pain in joint, ankle and foot 06/14/2012  . Other and unspecified hyperlipidemia 06/01/2012  . Contusion of face, scalp, and neck except eye(s) 06/01/2012  . Contusion of wrist 06/01/2012  . Edema 04/20/2012  . Open wound of knee, leg (except thigh), and ankle,  without mention of complication 04/20/2012  . Closed fracture of sacrum and coccyx without mention of spinal cord injury 02/14/2012  . Unspecified hereditary and idiopathic peripheral neuropathy 01/27/2012  . Peripheral vascular disease, unspecified 01/27/2012  . Other disorder of coccyx 01/27/2012  . Personal history of fall 01/27/2012  . Ventricular fibrillation 01/24/2012  . Acute kidney failure, unspecified 01/24/2012  . Cerebral embolism 05/23/2005  . Pneumonia, organism unspecified 01/23/2005    Past Surgical History  Procedure Laterality Date  . Abdominal hysterectomy  1990    for endometrial cancer  . Tonsillectomy  1916  . Mastoidectomy  1920    bilateral  . Gum surgery  1932  . Ectopic pregnancy surgery  1952  . Thyroidectomy  1960  . Cataract extraction w/ intraocular lens  implant, bilateral      Patient Care Team: Kimber RelicArthur G Green, MD as PCP - General (Internal Medicine) [facility] Mast X, NP as Nurse Practitioner (Nurse Practitioner)  History   Social History  . Marital Status: Widowed    Spouse Name: N/A    Number of Children: N/A  . Years of Education: N/A   Occupational History  . retired Runner, broadcasting/film/videoteacher    Social History Main Topics  . Smoking status: Never Smoker   . Smokeless tobacco: Never Used  . Alcohol Use: No  . Drug Use: No  . Sexual Activity: No   Other Topics Concern  . Not on file   Social History Narrative   [facility]   Widowed   Never smoked   Alcohol none   Exercise  reports that she has never smoked. She has never used smokeless tobacco. She reports that she does not drink alcohol or use illicit drugs.  Immunization History  Administered Date(s) Administered  . Influenza Whole 10/19/2012  . Influenza-Unspecified 10/18/2013  . PPD Test 01/22/2013  . Pneumococcal Polysaccharide-23 01/22/2013  . Td 02/21/2009    Allergies  Allergen Reactions  . Amoxicillin   . Avelox [Moxifloxacin]   .  Erythromycin   . Monistat [Miconazole]   . Morphine And Related     Medications: Patient's Medications  New Prescriptions   No medications on file  Previous Medications   ACETAMINOPHEN (TYLENOL) 325 MG TABLET    Take 650 mg by mouth every 4 (four) hours as needed for pain. Pain/headache   ASPIRIN 81 MG CHEWABLE TABLET    Chew 81 mg by mouth daily.   B COMPLEX-C (B-COMPLEX WITH VITAMIN C) TABLET    Take 1 tablet by mouth daily.   CALCIUM CITRATE-VITAMIN D (CITRACAL+D) 315-200 MG-UNIT PER TABLET    Take 1 tablet by mouth daily.   DOCUSATE SODIUM (COLACE) 100 MG CAPSULE    Take 200 mg by mouth. Take 2 tablets once a day   FEXOFENADINE (ALLEGRA) 60 MG TABLET    Take 60 mg by mouth daily.   HYDROCODONE-ACETAMINOPHEN (NORCO/VICODIN) 5-325 MG PER TABLET    Take one tablet by mouth three times daily for pain   LEVOTHYROXINE (SYNTHROID, LEVOTHROID) 125 MCG TABLET    Take 125 mcg by mouth daily before breakfast.   MULTIPLE VITAMINS-MINERALS (EQL CENTURY MATURE) TABS    Take by mouth. Take one tablet daily  Modified Medications   No medications on file  Discontinued Medications   No medications on file     Review of Systems  Constitutional:       Frail. Overweight. Elderly.  HENT: Positive for hearing loss (severe. Bilateral.).   Eyes:       Dry eyes.  Cardiovascular:       History CHF  Endocrine: Negative.   Genitourinary: Positive for frequency.  Musculoskeletal: Positive for back pain, joint swelling (small effusion of the left knee), arthralgias (knees, pelvis, neck, shoulders, wrists), gait problem (Balance poor. using walker) and neck pain.  Skin:       Dry  Allergic/Immunologic: Negative.   Neurological: Positive for weakness. Negative for seizures, light-headedness and headaches.       Poor balance.  Hematological: Negative.   Psychiatric/Behavioral: Positive for confusion.     Filed Vitals:   09/26/14 1517  BP: 186/94  Pulse: 80  Temp: 97 F (36.1 C)  TempSrc: Oral    Height: 5\' 2"  (1.575 m)  Weight: 159 lb (72.122 kg)  SpO2: 89%   Body mass index is 29.07 kg/(m^2).  Physical Exam  Constitutional: She is oriented to person, place, and time. She appears well-developed and well-nourished. No distress.  HENT:  Head: Normocephalic and atraumatic.  Right Ear: External ear normal.  Left Ear: External ear normal.  Nose: Nose normal.  Mouth/Throat: Oropharynx is clear and moist. No oropharyngeal exudate.  Left TM tube seen. Bilateral severe hearing loss. Small EAC  Bilaterally.  Eyes: Conjunctivae and EOM are normal. Pupils are equal, round, and reactive to light. Right eye exhibits no discharge. Left eye exhibits no discharge.  Lens implants bilaterally  Neck: Normal range of motion. Neck supple. No JVD present. No thyromegaly present.  Cardiovascular: Exam reveals no gallop and no friction rub.   No murmur heard. Pulmonary/Chest: Effort normal. No respiratory  distress. She has rales in the right lower field and the left lower field. She exhibits no tenderness.  Abdominal: Soft. Bowel sounds are normal. She exhibits no distension and no mass. There is no tenderness. There is no guarding. No hernia.  Musculoskeletal: Normal range of motion. She exhibits edema (pedal and ankles. ) and tenderness (c/o lower back and knees pain. ).  Lymphadenopathy:    She has no cervical adenopathy.  Neurological: She is alert and oriented to person, place, and time. She displays normal reflexes. No cranial nerve deficit. Coordination normal.  Skin: Skin is warm and dry. No rash noted. She is not diaphoretic. No erythema. No pallor.  Psychiatric: She has a normal mood and affect. Her behavior is normal. Judgment and thought content normal.  Some confusion     Labs reviewed: No visits with results within 3 Month(s) from this visit. Latest known visit with results is:  Lab on 05/23/2014  Component Date Value Ref Range Status  . TSH 05/21/2014 2.31  0.41 - 5.90 uIU/mL  Final    Assessment/Plan  1.  DNR (Do Not Resuscitate)  2. Essential hypertension Poor control today  3. Edema Unchanged, bipedal  4. Hypothyroidism, unspecified hypothyroidism type compensated  5. Gait instability continue use of walker due to poor balance  6. Arthralgia chronic  7. Stasis dermatitis of both legs Leads to chronic skin changes  8. Rheumatic fever Remote history  9. Hearing loss, bilateral Interferes with communication

## 2014-10-01 LAB — BASIC METABOLIC PANEL
BUN: 25 mg/dL — AB (ref 4–21)
Creatinine: 0.8 mg/dL (ref 0.5–1.1)
Glucose: 98 mg/dL
Potassium: 4.3 mmol/L (ref 3.4–5.3)
SODIUM: 138 mmol/L (ref 137–147)

## 2014-10-01 LAB — HEPATIC FUNCTION PANEL
ALT: 11 U/L (ref 7–35)
AST: 16 U/L (ref 13–35)
Alkaline Phosphatase: 45 U/L (ref 25–125)
BILIRUBIN, TOTAL: 0.3 mg/dL

## 2014-10-03 ENCOUNTER — Other Ambulatory Visit: Payer: Self-pay | Admitting: Nurse Practitioner

## 2014-10-03 DIAGNOSIS — I00 Rheumatic fever without heart involvement: Secondary | ICD-10-CM

## 2014-10-18 ENCOUNTER — Other Ambulatory Visit: Payer: Self-pay | Admitting: *Deleted

## 2014-10-18 MED ORDER — HYDROCODONE-ACETAMINOPHEN 5-325 MG PO TABS: ORAL_TABLET | ORAL | Status: DC

## 2014-10-18 NOTE — Telephone Encounter (Signed)
[facility]  

## 2014-10-31 ENCOUNTER — Encounter: Payer: Self-pay | Admitting: Nurse Practitioner

## 2014-10-31 ENCOUNTER — Non-Acute Institutional Stay: Payer: Medicare PPO | Admitting: Nurse Practitioner

## 2014-10-31 VITALS — BP 124/70 | HR 80 | Wt 161.0 lb

## 2014-10-31 DIAGNOSIS — I10 Essential (primary) hypertension: Secondary | ICD-10-CM

## 2014-10-31 DIAGNOSIS — I8312 Varicose veins of left lower extremity with inflammation: Secondary | ICD-10-CM

## 2014-10-31 DIAGNOSIS — G5793 Unspecified mononeuropathy of bilateral lower limbs: Secondary | ICD-10-CM

## 2014-10-31 DIAGNOSIS — J449 Chronic obstructive pulmonary disease, unspecified: Secondary | ICD-10-CM

## 2014-10-31 DIAGNOSIS — G5792 Unspecified mononeuropathy of left lower limb: Secondary | ICD-10-CM

## 2014-10-31 DIAGNOSIS — M2550 Pain in unspecified joint: Secondary | ICD-10-CM

## 2014-10-31 DIAGNOSIS — K5900 Constipation, unspecified: Secondary | ICD-10-CM

## 2014-10-31 DIAGNOSIS — I00 Rheumatic fever without heart involvement: Secondary | ICD-10-CM

## 2014-10-31 DIAGNOSIS — G5791 Unspecified mononeuropathy of right lower limb: Secondary | ICD-10-CM

## 2014-10-31 DIAGNOSIS — R609 Edema, unspecified: Secondary | ICD-10-CM

## 2014-10-31 DIAGNOSIS — R350 Frequency of micturition: Secondary | ICD-10-CM

## 2014-10-31 DIAGNOSIS — I8311 Varicose veins of right lower extremity with inflammation: Secondary | ICD-10-CM

## 2014-10-31 DIAGNOSIS — E039 Hypothyroidism, unspecified: Secondary | ICD-10-CM

## 2014-10-31 DIAGNOSIS — I872 Venous insufficiency (chronic) (peripheral): Secondary | ICD-10-CM

## 2014-10-31 NOTE — Assessment & Plan Note (Signed)
Takes Levothyroxine 125mcg po daily, last TSH 2.307 05/21/14, update TSH

## 2014-10-31 NOTE — Assessment & Plan Note (Signed)
Chronic. 

## 2014-10-31 NOTE — Assessment & Plan Note (Signed)
Controlled w/o antihypertensive agent.   

## 2014-10-31 NOTE — Assessment & Plan Note (Signed)
Pain is better controlled on Hydrocodone/ApAp 5/325mg  tid(hx of falling associated with Gabapentin use). Pain mainly in back and knees. F/u  Ortho when the patient desires.

## 2014-10-31 NOTE — Assessment & Plan Note (Signed)
trace in ankles and feet. Clear lungs and regular heart beats with HR 70s. Decreased pedis dorsalis pulses, but warm to touch, no pigmentation in BLE.

## 2014-10-31 NOTE — Assessment & Plan Note (Signed)
Multiple joints: knees, shoulders, wrists, spine hips

## 2014-10-31 NOTE — Assessment & Plan Note (Signed)
No problem on Colace 200mg  daily.

## 2014-10-31 NOTE — Assessment & Plan Note (Signed)
Chronic, no s/s of infection presently.

## 2014-10-31 NOTE — Progress Notes (Signed)
Patient ID: [name], female   DOB: 06/30/1911, 80 y.o.   MRN: [id number]  Code Status: DNR  Allergies  Allergen Reactions  . Amoxicillin   . Avelox [Moxifloxacin]   . Erythromycin   . Monistat [Miconazole]   . Morphine And Related     Chief Complaint  Patient presents with  . Medical Management of Chronic Issues    thyroid, neuropathic pain    HPI: Patient is a 80 y.o. female seen in the Clinic at [facility] today for evaluation of her chronic medical conditions.  Problem List Items Addressed This Visit    Urinary frequency    Chronic.     Stasis dermatitis of both legs    Chronic, no s/s of infection presently.     Rheumatic fever    Age 3 10/01/14 ESR 13, RAF <10     Neuropathic pain of both legs    Pain is better controlled on Hydrocodone/ApAp 5/349m tid(hx of falling associated with Gabapentin use). Pain mainly in back and knees. F/u  Ortho when the patient desires.       Hypothyroidism (Chronic)    Takes Levothyroxine 1238m po daily, last TSH 2.307 05/21/14, update TSH      HTN (hypertension) (Chronic)    Controlled w/o antihypertensive agent    Edema (Chronic)    trace in ankles and feet. Clear lungs and regular heart beats with HR 70s. Decreased pedis dorsalis pulses, but warm to touch, no pigmentation in BLE.         COPD, mild    No respiratory distress noted.     Constipation - Primary    No problem on Colace 20013maily.       Arthralgia    Multiple joints: knees, shoulders, wrists, spine hips        Review of Systems:  Review of Systems  Constitutional: Negative for fever, chills, weight loss, malaise/fatigue and diaphoresis.  HENT: Positive for hearing loss (severe, f/u audiology, recently tube placed in the left ear drum,  hears better after impacted ceruen removed wtih ear lavage. ). Negative for congestion, ear discharge, ear pain and sore throat.   Eyes: Negative for blurred vision, double vision,  photophobia, pain, discharge and redness.  Respiratory: Negative for cough, sputum production and shortness of breath.   Cardiovascular: Positive for leg swelling (chronic. The patient stated its better than prior. ). Negative for chest pain, palpitations, orthopnea, claudication and PND.       Trace, no change, chronic.   Gastrointestinal: Negative for heartburn, nausea, vomiting, abdominal pain, diarrhea, constipation and blood in stool.  Genitourinary: Positive for frequency. Negative for dysuria, urgency, hematuria and flank pain.  Musculoskeletal: Positive for back pain and joint pain (knees). Negative for myalgias, falls and neck pain.       Hand cram--warm water bottle helps at night.   Skin: Negative for itching and rash.  Neurological: Positive for sensory change (neuropathic pain in legs ). Negative for dizziness, tingling, tremors, speech change, focal weakness, seizures, loss of consciousness and weakness.  Endo/Heme/Allergies: Negative for environmental allergies and polydipsia. Does not bruise/bleed easily.  Psychiatric/Behavioral: Positive for memory loss. Negative for depression and hallucinations. The patient is not nervous/anxious and does not have insomnia.      Past Medical History  Diagnosis Date  . Arthritis   . CHF (congestive heart failure)   . Hypothyroidism   . HTN (hypertension), benign   . H/O: CVA (cerebrovascular accident)   . COPD (chronic  obstructive pulmonary disease)   . Unspecified hearing loss 02/08/2013  . Unspecified constipation 11/02/2012  . Candidiasis of other urogenital sites 08/10/2012  . Insomnia, unspecified 08/10/2012  . Pain in joint, ankle and foot 06/14/2012  . Other and unspecified hyperlipidemia 06/01/2012  . Contusion of face, scalp, and neck except eye(s) 06/01/2012  . Contusion of wrist 06/01/2012  . Edema 04/20/2012  . Open wound of knee, leg (except thigh), and ankle, without mention of complication 04/19/2619  . Closed fracture of sacrum  and coccyx without mention of spinal cord injury 02/14/2012  . Unspecified hereditary and idiopathic peripheral neuropathy 01/27/2012  . Peripheral vascular disease, unspecified 01/27/2012  . Other disorder of coccyx 01/27/2012  . Personal history of fall 01/27/2012  . Ventricular fibrillation 01/24/2012  . Acute kidney failure, unspecified 01/24/2012  . Cerebral embolism 05/23/2005  . Pneumonia, organism unspecified 01/23/2005  . COPD, mild 01/20/2013   Past Surgical History  Procedure Laterality Date  . Abdominal hysterectomy  1990    for endometrial cancer  . Tonsillectomy  1916  . Mastoidectomy  1920    bilateral  . Gum surgery  1932  . Ectopic pregnancy surgery  1952  . Thyroidectomy  1960  . Cataract extraction w/ intraocular lens  implant, bilateral     Social History:   reports that she has never smoked. She has never used smokeless tobacco. She reports that she does not drink alcohol or use illicit drugs.   Medications: Patient's Medications  New Prescriptions   No medications on file  Previous Medications   ACETAMINOPHEN (TYLENOL) 325 MG TABLET    Take 650 mg by mouth every 4 (four) hours as needed for pain. Pain/headache   ASPIRIN 81 MG CHEWABLE TABLET    Chew 81 mg by mouth daily.   B COMPLEX-C (B-COMPLEX WITH VITAMIN C) TABLET    Take 1 tablet by mouth daily.   CALCIUM CITRATE-VITAMIN D (CITRACAL+D) 315-200 MG-UNIT PER TABLET    Take 1 tablet by mouth daily.   DOCUSATE SODIUM (COLACE) 100 MG CAPSULE    Take 200 mg by mouth. Take 2 tablets once a day   FENTANYL (Clay - DOSED MCG/HR) 25 MCG/HR PATCH    Apply new patch every 3 days for pain   FEXOFENADINE (ALLEGRA) 60 MG TABLET    Take 60 mg by mouth daily.   HYDROCODONE-ACETAMINOPHEN (NORCO/VICODIN) 5-325 MG PER TABLET    Take one tablet by mouth three times daily for pain   LEVOTHYROXINE (SYNTHROID, LEVOTHROID) 125 MCG TABLET    Take 125 mcg by mouth daily before breakfast.   MULTIPLE VITAMINS-MINERALS (EQL CENTURY  MATURE) TABS    Take by mouth. Take one tablet daily  Modified Medications   No medications on file  Discontinued Medications   No medications on file     Physical Exam: Physical Exam  Constitutional: She is oriented to person, place, and time. She appears well-developed and well-nourished. No distress.  HENT:  Head: Normocephalic and atraumatic.  Right Ear: External ear normal.  Left Ear: External ear normal.  Nose: Nose normal.  Mouth/Throat: Oropharynx is clear and moist. No oropharyngeal exudate.  Left TM tube seen.   Eyes: Conjunctivae and EOM are normal. Pupils are equal, round, and reactive to light. Right eye exhibits no discharge. Left eye exhibits no discharge.  Neck: Normal range of motion. Neck supple. No JVD present. No thyromegaly present.  Cardiovascular: Exam reveals no gallop and no friction rub.   No murmur heard. Pulmonary/Chest: Effort  normal. No respiratory distress. She has rales in the right lower field and the left lower field. She exhibits no tenderness.  Dry rales bibasilar.   Abdominal: Soft. Bowel sounds are normal. She exhibits no distension and no mass. There is no tenderness. There is no guarding. No hernia.  Musculoskeletal: Normal range of motion. She exhibits edema (pedal and ankles. ) and tenderness (c/o lower back and knees pain. ).  Lymphadenopathy:    She has no cervical adenopathy.  Neurological: She is alert and oriented to person, place, and time. She displays normal reflexes. No cranial nerve deficit. Coordination normal.  Skin: Skin is warm and dry. No rash noted. She is not diaphoretic. No erythema. No pallor.     Psychiatric: She has a normal mood and affect. Her behavior is normal. Judgment and thought content normal.  Some confusion    Filed Vitals:   10/31/14 1430  BP: 124/70  Pulse: 80  Weight: 161 lb (73.029 kg)      Labs reviewed: Basic Metabolic Panel:  Recent Labs  02/26/14 05/21/14 10/01/14  NA 141  --  138  K  4.3  --  4.3  BUN 23*  --  25*  CREATININE 0.8  --  0.8  TSH 0.27* 2.31  --    Liver Function Tests:  Recent Labs  02/26/14 10/01/14  AST 15 16  ALT 10 11  ALKPHOS 50 45   CBC:  Recent Labs  02/26/14  WBC 5.6  HGB 14.1  HCT 42  PLT 169   Past Procedures: 01/18/13 CXR old granulomatous disease, no inflammatory consolidate, mild bibasilar atelectasis, mild cardiomegaly without pulmonary vascualr congestion or pleural effusion.  05/03/13 X-ray C spine: cervical spondylosis.    Assessment/Plan Constipation No problem on Colace 253m daily.     Urinary frequency Chronic.   Rheumatic fever Age 60 10/01/14 ESR 13, RAF <10   Stasis dermatitis of both legs Chronic, no s/s of infection presently.   Neuropathic pain of both legs Pain is better controlled on Hydrocodone/ApAp 5/3252mtid(hx of falling associated with Gabapentin use). Pain mainly in back and knees. F/u  Ortho when the patient desires.     Hypothyroidism Takes Levothyroxine 12559mpo daily, last TSH 2.307 05/21/14, update TSH    HTN (hypertension) Controlled w/o antihypertensive agent  Edema trace in ankles and feet. Clear lungs and regular heart beats with HR 70s. Decreased pedis dorsalis pulses, but warm to touch, no pigmentation in BLE.       Arthralgia Multiple joints: knees, shoulders, wrists, spine hips   COPD, mild No respiratory distress noted.     Family/ Staff Communication: observe the patient.   Goals of Care: AL  Labs/tests ordered: TSH

## 2014-10-31 NOTE — Assessment & Plan Note (Signed)
Age 80 10/01/14 ESR 13, RAF <10

## 2014-10-31 NOTE — Assessment & Plan Note (Signed)
No respiratory distress noted.

## 2014-11-05 LAB — TSH: TSH: 3.96 u[IU]/mL (ref 0.41–5.90)

## 2014-11-06 ENCOUNTER — Other Ambulatory Visit: Payer: Self-pay | Admitting: Nurse Practitioner

## 2014-11-14 ENCOUNTER — Other Ambulatory Visit: Payer: Self-pay | Admitting: *Deleted

## 2014-11-14 MED ORDER — FENTANYL 25 MCG/HR TD PT72: MEDICATED_PATCH | TRANSDERMAL | Status: DC

## 2014-11-14 MED ORDER — HYDROCODONE-ACETAMINOPHEN 5-325 MG PO TABS: ORAL_TABLET | ORAL | Status: DC

## 2014-11-14 NOTE — Telephone Encounter (Signed)
[facility] 

## 2014-11-14 NOTE — Telephone Encounter (Signed)
[facility] fax order

## 2014-11-17 ENCOUNTER — Encounter: Payer: Self-pay | Admitting: Internal Medicine

## 2014-11-18 ENCOUNTER — Encounter: Payer: Self-pay | Admitting: Internal Medicine

## 2014-11-18 NOTE — Addendum Note (Signed)
Addended by: Kimber RelicGREEN, Marcene Laskowski G on: 11/18/2014 11:39 AM   Modules accepted: Level of Service

## 2015-02-17 ENCOUNTER — Other Ambulatory Visit: Payer: Self-pay | Admitting: *Deleted

## 2015-02-17 MED ORDER — HYDROCODONE-ACETAMINOPHEN 5-325 MG PO TABS: ORAL_TABLET | ORAL | Status: DC

## 2015-02-17 NOTE — Telephone Encounter (Signed)
[facility] [place] 

## 2015-03-14 ENCOUNTER — Other Ambulatory Visit: Payer: Self-pay | Admitting: *Deleted

## 2015-03-14 MED ORDER — FENTANYL 25 MCG/HR TD PT72: MEDICATED_PATCH | TRANSDERMAL | Status: DC

## 2015-03-14 NOTE — Telephone Encounter (Signed)
[facility] [place] 

## 2015-03-17 ENCOUNTER — Other Ambulatory Visit: Payer: Self-pay | Admitting: *Deleted

## 2015-03-17 MED ORDER — HYDROCODONE-ACETAMINOPHEN 5-325 MG PO TABS: ORAL_TABLET | ORAL | Status: DC

## 2015-03-17 NOTE — Telephone Encounter (Signed)
[facility]  

## 2015-04-07 ENCOUNTER — Emergency Department (HOSPITAL_COMMUNITY): Payer: Medicare PPO

## 2015-04-07 ENCOUNTER — Encounter (HOSPITAL_COMMUNITY): Payer: Self-pay | Admitting: Emergency Medicine

## 2015-04-07 ENCOUNTER — Emergency Department (HOSPITAL_COMMUNITY)
Admission: EM | Admit: 2015-04-07 | Discharge: 2015-04-07 | Disposition: A | Discharge status: Home / Self Care | Payer: Medicare PPO | Attending: Emergency Medicine | Admitting: Emergency Medicine

## 2015-04-07 DIAGNOSIS — I10 Essential (primary) hypertension: Secondary | ICD-10-CM | POA: Insufficient documentation

## 2015-04-07 DIAGNOSIS — Z8673 Personal history of transient ischemic attack (TIA), and cerebral infarction without residual deficits: Secondary | ICD-10-CM | POA: Diagnosis not present

## 2015-04-07 DIAGNOSIS — W01198A Fall on same level from slipping, tripping and stumbling with subsequent striking against other object, initial encounter: Secondary | ICD-10-CM | POA: Insufficient documentation

## 2015-04-07 DIAGNOSIS — Y92009 Unspecified place in unspecified non-institutional (private) residence as the place of occurrence of the external cause: Secondary | ICD-10-CM | POA: Insufficient documentation

## 2015-04-07 DIAGNOSIS — S8991XA Unspecified injury of right lower leg, initial encounter: Secondary | ICD-10-CM | POA: Insufficient documentation

## 2015-04-07 DIAGNOSIS — I509 Heart failure, unspecified: Secondary | ICD-10-CM | POA: Insufficient documentation

## 2015-04-07 DIAGNOSIS — Z87448 Personal history of other diseases of urinary system: Secondary | ICD-10-CM | POA: Diagnosis not present

## 2015-04-07 DIAGNOSIS — H9190 Unspecified hearing loss, unspecified ear: Secondary | ICD-10-CM | POA: Diagnosis not present

## 2015-04-07 DIAGNOSIS — Y998 Other external cause status: Secondary | ICD-10-CM | POA: Diagnosis not present

## 2015-04-07 DIAGNOSIS — Z8542 Personal history of malignant neoplasm of other parts of uterus: Secondary | ICD-10-CM | POA: Insufficient documentation

## 2015-04-07 DIAGNOSIS — Z79899 Other long term (current) drug therapy: Secondary | ICD-10-CM | POA: Insufficient documentation

## 2015-04-07 DIAGNOSIS — Z7982 Long term (current) use of aspirin: Secondary | ICD-10-CM | POA: Diagnosis not present

## 2015-04-07 DIAGNOSIS — Z9181 History of falling: Secondary | ICD-10-CM | POA: Insufficient documentation

## 2015-04-07 DIAGNOSIS — Z8701 Personal history of pneumonia (recurrent): Secondary | ICD-10-CM | POA: Diagnosis not present

## 2015-04-07 DIAGNOSIS — Z8719 Personal history of other diseases of the digestive system: Secondary | ICD-10-CM | POA: Diagnosis not present

## 2015-04-07 DIAGNOSIS — S0181XA Laceration without foreign body of other part of head, initial encounter: Secondary | ICD-10-CM | POA: Diagnosis present

## 2015-04-07 DIAGNOSIS — Z880 Allergy status to penicillin: Secondary | ICD-10-CM | POA: Diagnosis not present

## 2015-04-07 DIAGNOSIS — J449 Chronic obstructive pulmonary disease, unspecified: Secondary | ICD-10-CM | POA: Diagnosis not present

## 2015-04-07 DIAGNOSIS — Z8619 Personal history of other infectious and parasitic diseases: Secondary | ICD-10-CM | POA: Insufficient documentation

## 2015-04-07 DIAGNOSIS — E039 Hypothyroidism, unspecified: Secondary | ICD-10-CM | POA: Diagnosis not present

## 2015-04-07 DIAGNOSIS — M1711 Unilateral primary osteoarthritis, right knee: Secondary | ICD-10-CM

## 2015-04-07 DIAGNOSIS — Y9389 Activity, other specified: Secondary | ICD-10-CM | POA: Insufficient documentation

## 2015-04-07 DIAGNOSIS — Z23 Encounter for immunization: Secondary | ICD-10-CM | POA: Insufficient documentation

## 2015-04-07 MED ORDER — TETANUS-DIPHTH-ACELL PERTUSSIS 5-2.5-18.5 LF-MCG/0.5 IM SUSP
0.5000 mL | Freq: Once | INTRAMUSCULAR | Status: AC
  Administered 2015-04-07: 0.5 mL via INTRAMUSCULAR
  Filled 2015-04-07: qty 0.5

## 2015-04-07 MED ORDER — LIDOCAINE-EPINEPHRINE-TETRACAINE (LET) SOLUTION
3.0000 mL | Freq: Once | NASAL | Status: AC
  Administered 2015-04-07: 3 mL via TOPICAL
  Filled 2015-04-07: qty 3

## 2015-04-07 NOTE — ED Notes (Signed)
Called [facility]. Made them aware pt is up for discharge and ready for transport back to facility.   Awaiting call back with time

## 2015-04-07 NOTE — ED Notes (Signed)
Bed: [id number] Expected date:  Expected time:  Means of arrival:  Comments: EMS fall 81yo

## 2015-04-07 NOTE — ED Notes (Signed)
[facility]  Pt states that she was hanging up a blouse in her closet when she lost her footing after her walker turned over and fell face first on the floor.  Pt has laceration and hematoma to forehead on arrival.  Pt was cleared by EMS so no c-collar was placed.  Pt denies LOC or dizziness when fell.  Pt c/o acute pain and arthritis pain that is chronic.

## 2015-04-07 NOTE — Discharge Instructions (Signed)
Tissue Adhesive Wound Care  Some cuts, wounds, lacerations, and incisions can be repaired by using tissue adhesive. Tissue adhesive is like glue. It holds the skin together, allowing for faster healing. It forms a strong bond on the skin in about 1 minute and reaches its full strength in about 2 or 3 minutes. The adhesive disappears naturally while the wound is healing. It is important to take proper care of your wound at home while it heals.   HOME CARE INSTRUCTIONS    Showers are allowed. Do not soak the area containing the tissue adhesive. Do not take baths, swim, or use hot tubs. Do not use any soaps or ointments on the wound. Certain ointments can weaken the glue.   If a bandage (dressing) has been applied, follow your health care provider's instructions for how often to change the dressing.    Keep the dressing dry if one has been applied.    Do not scratch, pick, or rub the adhesive.    Do not place tape over the adhesive. The adhesive could come off when pulling the tape off.    Protect the wound from further injury until it is healed.    Protect the wound from sun and tanning bed exposure while it is healing and for several weeks after healing.    Only take over-the-counter or prescription medicines as directed by your health care provider.    Keep all follow-up appointments as directed by your health care provider.  SEEK IMMEDIATE MEDICAL CARE IF:    Your wound becomes red, swollen, hot, or tender.    You develop a rash after the glue is applied.   You have increasing pain in the wound.    You have a red streak that goes away from the wound.    You have pus coming from the wound.    You have increased bleeding.   You have a fever.   You have shaking chills.    You notice a bad smell coming from the wound.    Your wound or adhesive breaks open.   MAKE SURE YOU:    Understand these instructions.   Will watch your condition.   Will get help right away if you are not doing  well or get worse.  Document Released: 05/25/2001 Document Revised: 09/19/2013 Document Reviewed: 06/20/2013  [employer] Patient Information 2015 [employer]. This information is not intended to replace advice given to you by your health care provider. Make sure you discuss any questions you have with your health care provider.          Head Injury  You have received a head injury. It does not appear serious at this time. Headaches and vomiting are common following head injury. It should be easy to awaken from sleeping. Sometimes it is necessary for you to stay in the emergency department for a while for observation. Sometimes admission to the hospital may be needed. After injuries such as yours, most problems occur within the first 24 hours, but side effects may occur up to 7-10 days after the injury. It is important for you to carefully monitor your condition and contact your health care provider or seek immediate medical care if there is a change in your condition.  WHAT ARE THE TYPES OF HEAD INJURIES?  Head injuries can be as minor as a bump. Some head injuries can be more severe. More severe head injuries include:   A jarring injury to the brain (concussion).   A bruise   head injury is most likely to happen to someone who is in a car wreck and is not wearing a seat belt. Other causes of major head injuries include bicycle or motorcycle accidents, sports injuries, and falls. HOW ARE HEAD INJURIES DIAGNOSED? A complete history of the event leading to the injury and your current symptoms will be helpful in diagnosing head injuries. Many times, pictures of the brain, such as CT or MRI are needed to see the extent of the injury. Often, an overnight hospital stay is necessary for  observation.  WHEN SHOULD I SEEK IMMEDIATE MEDICAL CARE?  You should get help right away if:  You have confusion or drowsiness.  You feel sick to your stomach (nauseous) or have continued, forceful vomiting.  You have dizziness or unsteadiness that is getting worse.  You have severe, continued headaches not relieved by medicine. Only take over-the-counter or prescription medicines for pain, fever, or discomfort as directed by your health care provider.  You do not have normal function of the arms or legs or are unable to walk.  You notice changes in the black spots in the center of the colored part of your eye (pupil).  You have a clear or bloody fluid coming from your nose or ears.  You have a loss of vision. During the next 24 hours after the injury, you must stay with someone who can watch you for the warning signs. This person should contact local emergency services (911 in the [place] if you have seizures, you become unconscious, or you are unable to wake up. HOW CAN I PREVENT A HEAD INJURY IN THE FUTURE? The most important factor for preventing major head injuries is avoiding motor vehicle accidents. To minimize the potential for damage to your head, it is crucial to wear seat belts while riding in motor vehicles. Wearing helmets while bike riding and playing collision sports (like football) is also helpful. Also, avoiding dangerous activities around the house will further help reduce your risk of head injury.  WHEN CAN I RETURN TO NORMAL ACTIVITIES AND ATHLETICS? You should be reevaluated by your health care provider before returning to these activities. If you have any of the following symptoms, you should not return to activities or contact sports until 1 week after the symptoms have stopped:  Persistent headache.  Dizziness or vertigo.  Poor attention and concentration.  Confusion.  Memory problems.  Nausea or vomiting.  Fatigue or tire  easily.  Irritability.  Intolerant of bright lights or loud noises.  Anxiety or depression.  Disturbed sleep. MAKE SURE YOU:   Understand these instructions.  Will watch your condition.  Will get help right away if you are not doing well or get worse. Document Released: 11/29/2005 Document Revised: 12/04/2013 Document Reviewed: 08/06/2013 [facility] Patient Information 2015 [place], [place]. This information is not intended to replace advice given to you by your health care provider. Make sure you discuss any questions you have with your health care provider.

## 2015-04-07 NOTE — ED Provider Notes (Signed)
CSN: [id number]     Arrival date & time 04/07/15  0845 History   First MD Initiated Contact with Patient 04/07/15 [phone]     Chief Complaint  Patient presents with  . Fall  . Head Laceration    HPI Patient presents to the emergency room for evaluation after a fall at home. The [place]. She was trying to hang up her blouse in a closet when she lost her footing and stumbled when her walker turned over. Patient struck her head on something. She sustained a laceration. She denies any trouble with loss of consciousness. She denies any headache. She does not take any anticoagulant medications.  Patient states she normally has aches in her joints all over but denies any acute issues. She denies any neck pain or back pain. He has been able to stand. She denies any pain in her hips. No numbness or weakness.  She has not had any recent illnesses. No fevers or chills. No vomiting or diarrhea. No chest pain or shortness of breath. Past Medical History  Diagnosis Date  . Arthritis   . CHF (congestive heart failure)   . Hypothyroidism   . HTN (hypertension), benign   . H/O: CVA (cerebrovascular accident)   . COPD (chronic obstructive pulmonary disease)   . Unspecified hearing loss 02/08/2013  . Unspecified constipation 11/02/2012  . Candidiasis of other urogenital sites 08/10/2012  . Insomnia, unspecified 08/10/2012  . Pain in joint, ankle and foot 06/14/2012  . Other and unspecified hyperlipidemia 06/01/2012  . Contusion of face, scalp, and neck except eye(s) 06/01/2012  . Contusion of wrist 06/01/2012  . Edema 04/20/2012  . Open wound of knee, leg (except thigh), and ankle, without mention of complication 04/20/2012  . Closed fracture of sacrum and coccyx without mention of spinal cord injury 02/14/2012  . Unspecified hereditary and idiopathic peripheral neuropathy 01/27/2012  . Peripheral vascular disease, unspecified 01/27/2012  . Other disorder of coccyx 01/27/2012  . Personal  history of fall 01/27/2012  . Ventricular fibrillation 01/24/2012  . Acute kidney failure, unspecified 01/24/2012  . Cerebral embolism 05/23/2005  . Pneumonia, organism unspecified 01/23/2005  . COPD, mild 01/20/2013  . History of cancer of uterus    Past Surgical History  Procedure Laterality Date  . Abdominal hysterectomy  1990    for endometrial cancer  . Tonsillectomy  1916  . Mastoidectomy  1920    bilateral  . Gum surgery  1932  . Ectopic pregnancy surgery  1952  . Thyroidectomy  1960  . Cataract extraction w/ intraocular lens  implant, bilateral     Family History  Problem Relation Age of Onset  . Stroke Mother   . Heart disease Father    History  Substance Use Topics  . Smoking status: Never Smoker   . Smokeless tobacco: Never Used  . Alcohol Use: No   OB History    No data available     Review of Systems  All other systems reviewed and are negative.     Allergies  Amoxicillin; Avelox; Erythromycin; Monistat; Morphine and related; and Orange juice  Home Medications   Prior to Admission medications   Medication Sig Start Date End Date Taking? Authorizing Provider  acetaminophen (TYLENOL) 325 MG tablet Take 650 mg by mouth every 4 (four) hours as needed for pain. Pain/headache   Yes Historical Provider, MD  aspirin 81 MG chewable tablet Chew 81 mg by mouth daily.   Yes Historical Provider, MD  B Complex-C (B-COMPLEX WITH VITAMIN C) tablet Take 1 tablet by mouth daily.   Yes Historical Provider, MD  calcium citrate-vitamin D (CITRACAL+D) 315-200 MG-UNIT per tablet Take 1 tablet by mouth daily.   Yes Historical Provider, MD  docusate sodium (COLACE) 100 MG capsule Take 200 mg by mouth daily.    Yes Historical Provider, MD  fentaNYL (DURAGESIC - DOSED MCG/HR) 25 MCG/HR patch Apply one patch topically every 3 days. Remove old patch before applying new patch 03/14/15  Yes Tiffany L Reed, DO  fexofenadine (ALLEGRA) 60 MG tablet Take 60 mg by mouth daily.   Yes Historical  Provider, MD  HYDROcodone-acetaminophen (NORCO/VICODIN) 5-325 MG per tablet Take one tablet by mouth three times daily for pain 03/17/15  Yes Sharon Seller, NP  levothyroxine (SYNTHROID, LEVOTHROID) 125 MCG tablet Take 125 mcg by mouth daily before breakfast.   Yes Historical Provider, MD  Multiple Vitamins-Minerals (EQL CENTURY MATURE) TABS Take 1 tablet by mouth daily.    Yes Historical Provider, MD   There were no vitals taken for this visit. Physical Exam  Constitutional: No distress.  HENT:  Head: Normocephalic. Head is with laceration. Head is without raccoon's eyes and without Battle's sign.  Right Ear: External ear normal.  Left Ear: External ear normal.  Stellate laceration left forehead  Eyes: Conjunctivae are normal. Right eye exhibits no discharge. Left eye exhibits no discharge. No scleral icterus.  Neck: Neck supple. No tracheal deviation present.  Cardiovascular: Normal rate, regular rhythm and intact distal pulses.   Pulmonary/Chest: Effort normal and breath sounds normal. No stridor. No respiratory distress. She has no wheezes. She has no rales.  Abdominal: Soft. Bowel sounds are normal. She exhibits no distension. There is no tenderness. There is no rebound and no guarding.  Musculoskeletal: She exhibits no edema.       Right knee: She exhibits decreased range of motion. She exhibits no ecchymosis. Tenderness found.       Cervical back: Normal.       Thoracic back: Normal.       Lumbar back: Normal.  Full range of motion in all 4 extremities without pain or discomfort except for right knee, no obvious sites of swelling or effusions   Neurological: She is alert. She has normal strength. No cranial nerve deficit (no facial droop, extraocular movements intact, no slurred speech) or sensory deficit. She exhibits normal muscle tone. She displays no seizure activity. Coordination normal.  Skin: Skin is warm and dry. No rash noted. She is not diaphoretic.  Psychiatric: She has  a normal mood and affect.  Nursing note and vitals reviewed.   ED Course  LACERATION REPAIR Date/Time: 04/07/2015 9:57 AM Performed by: Linwood Dibbles Authorized by: Linwood Dibbles Consent: Verbal consent obtained. Risks and benefits: risks, benefits and alternatives were discussed Consent given by: patient Body area: head/neck Location details: forehead Laceration length: 5 cm Tendon involvement: none Nerve involvement: none Vascular damage: no Patient sedated: no Irrigation solution: saline Amount of cleaning: standard Debridement: none Degree of undermining: none Skin closure: glue Technique: simple Approximation: close Approximation difficulty: simple Comments: Stellate laceration, wound edges well approximated, tissue adhesive applied   (including critical care time) Labs Review Labs Reviewed - No data to display  Imaging Review Dg Knee Complete 4 Views Right  04/07/2015   CLINICAL DATA:  Fall.  Knee pain.  EXAM: RIGHT KNEE - COMPLETE 4+ VIEW  COMPARISON:  None.  FINDINGS: Severe tricompartment degenerative change noted. Chondrocalcinosis is noted consistent degenerative  change. No evidence of fracture or dislocation. Diffuse osteopenia. Peripheral vascular calcification noted.  IMPRESSION: 1. Severe tricompartment degenerative change. Diffuse osteopenia. No acute bony abnormality.  2.  Peripheral vascular disease.   Electronically Signed   By: Maisie Fus  Register   On: 04/07/2015 09:37     EKG Interpretation None      MDM   Final diagnoses:  Forehead laceration, initial encounter  Osteoarthritis of right knee, unspecified osteoarthritis type    Mechanical fall.  No LOC.  Not on anticoagulants.  Tolerated wound closure with tissue adhesive.  Dc back to her living facility.   Linwood Dibbles, MD 04/07/15 [phone]

## 2015-04-07 NOTE — ED Notes (Signed)
Patient transported to X-ray 

## 2015-04-10 ENCOUNTER — Encounter: Payer: Medicare PPO | Admitting: Internal Medicine

## 2015-04-17 ENCOUNTER — Encounter: Payer: Self-pay | Admitting: Nurse Practitioner

## 2015-04-17 ENCOUNTER — Other Ambulatory Visit: Payer: Self-pay | Admitting: *Deleted

## 2015-04-17 ENCOUNTER — Non-Acute Institutional Stay: Payer: Medicare PPO | Admitting: Nurse Practitioner

## 2015-04-17 VITALS — BP 158/84 | HR 62 | Temp 97.8°F

## 2015-04-17 DIAGNOSIS — M2550 Pain in unspecified joint: Secondary | ICD-10-CM

## 2015-04-17 DIAGNOSIS — R2681 Unsteadiness on feet: Secondary | ICD-10-CM | POA: Diagnosis not present

## 2015-04-17 DIAGNOSIS — G5791 Unspecified mononeuropathy of right lower limb: Secondary | ICD-10-CM

## 2015-04-17 DIAGNOSIS — R350 Frequency of micturition: Secondary | ICD-10-CM

## 2015-04-17 DIAGNOSIS — K5900 Constipation, unspecified: Secondary | ICD-10-CM | POA: Diagnosis not present

## 2015-04-17 DIAGNOSIS — R609 Edema, unspecified: Secondary | ICD-10-CM

## 2015-04-17 DIAGNOSIS — J449 Chronic obstructive pulmonary disease, unspecified: Secondary | ICD-10-CM | POA: Diagnosis not present

## 2015-04-17 DIAGNOSIS — G5793 Unspecified mononeuropathy of bilateral lower limbs: Secondary | ICD-10-CM

## 2015-04-17 DIAGNOSIS — I10 Essential (primary) hypertension: Secondary | ICD-10-CM

## 2015-04-17 DIAGNOSIS — E039 Hypothyroidism, unspecified: Secondary | ICD-10-CM

## 2015-04-17 DIAGNOSIS — G5792 Unspecified mononeuropathy of left lower limb: Secondary | ICD-10-CM | POA: Diagnosis not present

## 2015-04-17 MED ORDER — FENTANYL 25 MCG/HR TD PT72: MEDICATED_PATCH | TRANSDERMAL | Status: DC

## 2015-04-17 NOTE — Telephone Encounter (Signed)
[facility] 

## 2015-04-22 NOTE — Assessment & Plan Note (Signed)
trace in ankles and feet. Clear lungs and regular heart beats with HR 70s. Decreased pedis dorsalis pulses, but warm to touch, no pigmentation in BLE.      

## 2015-04-22 NOTE — Assessment & Plan Note (Signed)
Led to her fall in her room which resulted in facial and right knee contusion. Risk for falling-close supervision needed.

## 2015-04-22 NOTE — Progress Notes (Signed)
Patient ID: [name], female   DOB: [birth year], [age] y.o.   MRN: [id number]   Code Status: DNR  Allergies  Allergen Reactions  . Amoxicillin   . Avelox [Moxifloxacin]   . Erythromycin   . Monistat [Miconazole]   . Morphine And Related   . Orange Juice [Orange Oil]     Chief Complaint  Patient presents with  . Hospitalization Follow-up    ER follow-up 04/07/15 fell laceration 5cm forehead, adhesive applied.  With son Sam    HPI: Patient is a [age] y.o. female seen in [facility] clinic [facility] today for s/p fall, facial contusion, right knee pain,  and other chronic medical conditions.  Problem List Items Addressed This Visit    HTN (hypertension) - Primary (Chronic)    Mildly elevated sbp-150s-may consider Metoprolol 12.5mg  bid in setting of Hx of CHF      Hypothyroidism (Chronic)    Takes Levothyroxine 125mcg po daily, last TSH 2.307 05/21/14, 11/05/14 TSH 3.964        Edema (Chronic)    trace in ankles and feet. Clear lungs and regular heart beats with HR 70s. Decreased pedis dorsalis pulses, but warm to touch, no pigmentation in BLE.          COPD, mild    No respiratory distress noted.        Constipation    No problem on Colace 200mg  daily.         Neuropathic pain of both legs    Pain is better controlled, change Hydrocodone/ApAp 5/325mg  tid(hx of falling associated with Gabapentin use) to prn, continue Fentanyl 3025mcg/hr transdermal patch. Pain mainly in back and knees. F/u  Ortho when the patient desires.         Urinary frequency    Persisted. Adult depend for incontinent care.       Gait instability    Led to her fall in her room which resulted in facial and right knee contusion. Risk for falling-close supervision needed.       Arthralgia    Multiple sites. Takes Fentanyl 2325mcg/hr. Will change Norco to prn in setting of falling and no apparent pain complained during today's visit.          Review of Systems:  Review of Systems   Constitutional: Negative for fever, chills and diaphoresis.  HENT: Positive for hearing loss (severe, f/u audiology, recently tube placed in the left ear drum,  hears better after impacted ceruen removed wtih ear lavage. ). Negative for congestion, ear discharge, ear pain and sore throat.   Eyes: Negative for photophobia, pain, discharge and redness.  Respiratory: Negative for cough and shortness of breath.   Cardiovascular: Positive for leg swelling (chronic. The patient stated its better than prior. ). Negative for chest pain and palpitations.       Trace, no change, chronic.   Gastrointestinal: Negative for nausea, vomiting, abdominal pain, diarrhea, constipation and blood in stool.  Endocrine: Negative for polydipsia.  Genitourinary: Positive for frequency. Negative for dysuria, urgency, hematuria and flank pain.  Musculoskeletal: Positive for back pain. Negative for myalgias and neck pain.       Hand cram--warm water bottle helps at night.   Skin: Negative for rash.       Facial and right knee contusion.   Allergic/Immunologic: Negative for environmental allergies.  Neurological: Negative for dizziness, tremors, seizures and weakness.  Hematological: Does not bruise/bleed easily.  Psychiatric/Behavioral: Negative for hallucinations. The patient is not nervous/anxious.  Past Medical History  Diagnosis Date  . Arthritis   . CHF (congestive heart failure)   . Hypothyroidism   . HTN (hypertension), benign   . H/O: CVA (cerebrovascular accident)   . COPD (chronic obstructive pulmonary disease)   . Unspecified hearing loss 02/08/2013  . Unspecified constipation 11/02/2012  . Candidiasis of other urogenital sites 08/10/2012  . Insomnia, unspecified 08/10/2012  . Pain in joint, ankle and foot 06/14/2012  . Other and unspecified hyperlipidemia 06/01/2012  . Contusion of face, scalp, and neck except eye(s) 06/01/2012  . Contusion of wrist 06/01/2012  . Edema 04/20/2012  . Open wound of  knee, leg (except thigh), and ankle, without mention of complication 04/20/2012  . Closed fracture of sacrum and coccyx without mention of spinal cord injury 02/14/2012  . Unspecified hereditary and idiopathic peripheral neuropathy 01/27/2012  . Peripheral vascular disease, unspecified 01/27/2012  . Other disorder of coccyx 01/27/2012  . Personal history of fall 01/27/2012  . Ventricular fibrillation 01/24/2012  . Acute kidney failure, unspecified 01/24/2012  . Cerebral embolism 05/23/2005  . Pneumonia, organism unspecified 01/23/2005  . COPD, mild 01/20/2013  . History of cancer of uterus    Past Surgical History  Procedure Laterality Date  . Abdominal hysterectomy  1990    for endometrial cancer  . Tonsillectomy  1916  . Mastoidectomy  1920    bilateral  . Gum surgery  1932  . Ectopic pregnancy surgery  1952  . Thyroidectomy  1960  . Cataract extraction w/ intraocular lens  implant, bilateral     Social History:   reports that she has never smoked. She has never used smokeless tobacco. She reports that she does not drink alcohol or use illicit drugs.  Family History  Problem Relation Age of Onset  . Stroke Mother   . Heart disease Father     Medications: Patient's Medications  New Prescriptions   No medications on file  Previous Medications   ACETAMINOPHEN (TYLENOL) 325 MG TABLET    Take 650 mg by mouth every 4 (four) hours as needed for pain. Pain/headache   ASPIRIN 81 MG CHEWABLE TABLET    Chew 81 mg by mouth daily.   B COMPLEX-C (B-COMPLEX WITH VITAMIN C) TABLET    Take 1 tablet by mouth daily.   CALCIUM CITRATE-VITAMIN D (CITRACAL+D) 315-200 MG-UNIT PER TABLET    Take 1 tablet by mouth daily.   DOCUSATE SODIUM (COLACE) 100 MG CAPSULE    Take 200 mg by mouth daily.    FENTANYL (DURAGESIC - DOSED MCG/HR) 25 MCG/HR PATCH    Apply one patch topically every 3 days. Remove old patch before applying new patch   FEXOFENADINE (ALLEGRA) 60 MG TABLET    Take 60 mg by mouth daily.    HYDROCODONE-ACETAMINOPHEN (NORCO/VICODIN) 5-325 MG PER TABLET    Take one tablet by mouth three times daily for pain   LEVOTHYROXINE (SYNTHROID, LEVOTHROID) 125 MCG TABLET    Take 125 mcg by mouth daily before breakfast.   MULTIPLE VITAMINS-MINERALS (EQL CENTURY MATURE) TABS    Take 1 tablet by mouth daily.   Modified Medications   No medications on file  Discontinued Medications   No medications on file     Physical Exam: Physical Exam  Constitutional: She is oriented to person, place, and time. She appears well-developed and well-nourished. No distress.  HENT:  Head: Normocephalic and atraumatic.  Right Ear: External ear normal.  Left Ear: External ear normal.  Nose: Nose normal.  Mouth/Throat: Oropharynx  is clear and moist. No oropharyngeal exudate.  Left TM tube seen.   Eyes: Conjunctivae and EOM are normal. Pupils are equal, round, and reactive to light. Right eye exhibits no discharge. Left eye exhibits no discharge.  Neck: Normal range of motion. Neck supple. No JVD present. No thyromegaly present.  Cardiovascular: Exam reveals no gallop and no friction rub.   No murmur heard. Pulmonary/Chest: Effort normal. No respiratory distress. She has rales in the right lower field and the left lower field. She exhibits no tenderness.  Dry rales bibasilar.   Abdominal: Soft. Bowel sounds are normal. She exhibits no distension and no mass. There is no tenderness. There is no guarding. No hernia.  Musculoskeletal: Normal range of motion. She exhibits edema (pedal and ankles. ) and tenderness (c/o lower back and knees pain. ).  Lymphadenopathy:    She has no cervical adenopathy.  Neurological: She is alert and oriented to person, place, and time. She displays normal reflexes. No cranial nerve deficit. Coordination normal.  Skin: Skin is warm and dry. No rash noted. She is not diaphoretic. No erythema. No pallor.  Facial and right knee contusion.    Psychiatric: She has a normal mood and  affect. Her behavior is normal. Judgment and thought content normal.  Some confusion    Filed Vitals:   04/17/15 1532  BP: 158/84  Pulse: 62  Temp: 97.8 F (36.6 C)  TempSrc: Oral      Labs reviewed: Basic Metabolic Panel:  Recent Labs  16/10/96 10/01/14 11/05/14  NA  --  138  --   K  --  4.3  --   BUN  --  25*  --   CREATININE  --  0.8  --   TSH 2.31  --  3.96   Liver Function Tests:  Recent Labs  10/01/14  AST 16  ALT 11  ALKPHOS 45   No results for input(s): LIPASE, AMYLASE in the last 8760 hours. No results for input(s): AMMONIA in the last 8760 hours. CBC: No results for input(s): WBC, NEUTROABS, HGB, HCT, MCV, PLT in the last 8760 hours. Lipid Panel: No results for input(s): CHOL, HDL, LDLCALC, TRIG, CHOLHDL, LDLDIRECT in the last 8760 hours. Anemia Panel: No results for input(s): FOLATE, IRON, VITAMINB12 in the last 8760 hours.  Past Procedures:  01/18/13 CXR old granulomatous disease, no inflammatory consolidate, mild bibasilar atelectasis, mild cardiomegaly without pulmonary vascualr congestion or pleural effusion.   05/03/13 X-ray C spine: cervical spondylosis.   04/07/15 X-ray R knee  IMPRESSION: 1. Severe tricompartment degenerative change. Diffuse osteopenia. No acute bony abnormality.  2. Peripheral vascular disease.  Assessment/Plan HTN (hypertension) Mildly elevated sbp-150s-may consider Metoprolol 12.5mg  bid in setting of Hx of CHF   Arthralgia Multiple sites. Takes Fentanyl 19mcg/hr. Will change Norco to prn in setting of falling and no apparent pain complained during today's visit.    Constipation No problem on Colace  daily.      Gait instability Led to her fall in her room which resulted in facial and right knee contusion. Risk for falling-close supervision needed.    Hypothyroidism Takes Levothyroxine po daily, last TSH 2.307 05/21/14, 11/05/14 TSH 3.964     Edema trace in ankles and feet. Clear lungs  and regular heart beats with HR 70s. Decreased pedis dorsalis pulses, but warm to touch, no pigmentation in BLE.       COPD, mild No respiratory distress noted.     Neuropathic pain of both legs Pain is  better controlled, change Hydrocodone/ApAp 5/325mg  tid(hx of falling associated with Gabapentin use) to prn, continue Fentanyl 1825mcg/hr transdermal patch. Pain mainly in back and knees. F/u  Ortho when the patient desires.      Urinary frequency Persisted. Adult depend for incontinent care.      Family/ Staff Communication: observe the patient.   Goals of Care: [place]  Labs/tests ordered: none

## 2015-04-22 NOTE — Assessment & Plan Note (Signed)
Persisted. Adult depend for incontinent care.    

## 2015-04-22 NOTE — Assessment & Plan Note (Signed)
No respiratory distress noted.

## 2015-04-22 NOTE — Assessment & Plan Note (Signed)
No problem on Colace 200mg daily.    

## 2015-04-22 NOTE — Assessment & Plan Note (Signed)
Takes Levothyroxine 125mcg po daily, last TSH 2.307 05/21/14, 11/05/14 TSH 3.964

## 2015-04-22 NOTE — Assessment & Plan Note (Signed)
Mildly elevated sbp-150s-may consider Metoprolol 12.5mg  bid in setting of Hx of CHF

## 2015-04-22 NOTE — Assessment & Plan Note (Signed)
Pain is better controlled, change Hydrocodone/ApAp 5/325mg  tid(hx of falling associated with Gabapentin use) to prn, continue Fentanyl 1825mcg/hr transdermal patch. Pain mainly in back and knees. F/u  Ortho when the patient desires.

## 2015-04-22 NOTE — Assessment & Plan Note (Signed)
Multiple sites. Takes Fentanyl 425mcg/hr. Will change Norco to prn in setting of falling and no apparent pain complained during today's visit.

## 2015-05-20 ENCOUNTER — Other Ambulatory Visit: Payer: Self-pay | Admitting: *Deleted

## 2015-05-20 MED ORDER — FENTANYL 25 MCG/HR TD PT72: MEDICATED_PATCH | TRANSDERMAL | Status: DC

## 2015-05-26 DIAGNOSIS — R05 Cough: Secondary | ICD-10-CM | POA: Diagnosis not present

## 2015-05-29 ENCOUNTER — Non-Acute Institutional Stay: Payer: Medicare PPO | Admitting: Nurse Practitioner

## 2015-05-29 ENCOUNTER — Encounter: Payer: Self-pay | Admitting: Nurse Practitioner

## 2015-05-29 DIAGNOSIS — J449 Chronic obstructive pulmonary disease, unspecified: Secondary | ICD-10-CM | POA: Diagnosis not present

## 2015-05-29 DIAGNOSIS — M2550 Pain in unspecified joint: Secondary | ICD-10-CM

## 2015-05-29 DIAGNOSIS — K5900 Constipation, unspecified: Secondary | ICD-10-CM | POA: Diagnosis not present

## 2015-05-29 DIAGNOSIS — E039 Hypothyroidism, unspecified: Secondary | ICD-10-CM | POA: Diagnosis not present

## 2015-05-29 DIAGNOSIS — G5792 Unspecified mononeuropathy of left lower limb: Secondary | ICD-10-CM | POA: Diagnosis not present

## 2015-05-29 DIAGNOSIS — J209 Acute bronchitis, unspecified: Secondary | ICD-10-CM | POA: Diagnosis not present

## 2015-05-29 DIAGNOSIS — I10 Essential (primary) hypertension: Secondary | ICD-10-CM | POA: Diagnosis not present

## 2015-05-29 DIAGNOSIS — R609 Edema, unspecified: Secondary | ICD-10-CM

## 2015-05-29 DIAGNOSIS — R2681 Unsteadiness on feet: Secondary | ICD-10-CM

## 2015-05-29 DIAGNOSIS — R350 Frequency of micturition: Secondary | ICD-10-CM

## 2015-05-29 DIAGNOSIS — G5791 Unspecified mononeuropathy of right lower limb: Secondary | ICD-10-CM | POA: Diagnosis not present

## 2015-05-29 DIAGNOSIS — G5793 Unspecified mononeuropathy of bilateral lower limbs: Secondary | ICD-10-CM

## 2015-05-29 NOTE — Assessment & Plan Note (Signed)
Takes Levothyroxine po daily, last TSH 2.307 05/21/14, 11/05/14 TSH 3.964

## 2015-05-29 NOTE — Assessment & Plan Note (Signed)
Multiple sites. Takes Fentanyl 48mcg/hr. Continue Norco prn in setting of falling and no apparent pain complained during today's visit.

## 2015-05-29 NOTE — Assessment & Plan Note (Signed)
trace in ankles and feet. Clear lungs and regular heart beats with HR 70s. Decreased pedis dorsalis pulses, but warm to touch, no pigmentation in BLE.      

## 2015-05-29 NOTE — Assessment & Plan Note (Signed)
Led to her fall in her room which resulted in facial and right knee contusion. Risk for falling-close supervision needed.

## 2015-05-29 NOTE — Assessment & Plan Note (Signed)
Pain is better controlled, change Hydrocodone/ApAp 5/325mg  tid(hx of falling associated with Gabapentin use) to prn, continue Fentanyl 73mcg/hr transdermal patch. Pain mainly in back and knees. F/u  Ortho when the patient desires.

## 2015-05-29 NOTE — Assessment & Plan Note (Signed)
No problem on Colace 200mg daily.    

## 2015-05-29 NOTE — Progress Notes (Signed)
Patient ID: [name], female   DOB: [birth year], 81 y.o.   MRN: [id number]   Code Status: DNR  Allergies  Allergen Reactions  . Amoxicillin   . Avelox [Moxifloxacin]   . Erythromycin   . Monistat [Miconazole]   . Morphine And Related   . Orange Juice [Orange Oil]     Chief Complaint  Patient presents with  . Medical Management of Chronic Issues  . Acute Visit    congestive cough    HPI: Patient is a 81 y.o. female seen in the [facility] at [facility] today for congestive cough and chronic medical conditions.  Problem List Items Addressed This Visit    HTN (hypertension) (Chronic)    Mildly elevated sbp-150s-may consider Metoprolol 12.5mg  bid in setting of Hx of CHF       Hypothyroidism (Chronic)    Takes Levothyroxine po daily, last TSH 2.307 05/21/14, 11/05/14 TSH 3.964        Edema (Chronic)    trace in ankles and feet. Clear lungs and regular heart beats with HR 70s. Decreased pedis dorsalis pulses, but warm to touch, no pigmentation in BLE.       COPD, mild    05/26/15 CXR no active cardiopulmonary disease suspected.  Flare up-10 day doxy, 5 mucinex, and medrol dose pk.       Constipation    No problem on Colace  daily.        Neuropathic pain of both legs    Pain is better controlled, change Hydrocodone/ApAp 5/325mg  tid(hx of falling associated with Gabapentin use) to prn, continue Fentanyl 49mcg/hr transdermal patch. Pain mainly in back and knees. F/u  Ortho when the patient desires.        Urinary frequency    Persisted. Adult depend for incontinent care.        Gait instability    Led to her fall in her room which resulted in facial and right knee contusion. Risk for falling-close supervision needed.        Arthralgia    Multiple sites. Takes Fentanyl 54mcg/hr. Continue Norco prn in setting of falling and no apparent pain complained during today's visit.        Acute bronchitis - Primary    05/26/15 CXR no active  cardiopulmonary disease suspeted.  05/29/15 congestive cough, greenish sputum production, feeling bad, Neb relieves symptoms temporarily, Doxy  bid x 10 days, Mucinex  bid x 5 days, Medrol dose pk as directed. Observe the patient.          Review of Systems:  Review of Systems  Constitutional: Negative for fever, chills and diaphoresis.  HENT: Positive for hearing loss (severe, f/u audiology, recently tube placed in the left ear drum,  hears better after impacted ceruen removed wtih ear lavage. ). Negative for congestion, ear discharge, ear pain and sore throat.   Eyes: Negative for photophobia, pain, discharge and redness.  Respiratory: Positive for cough. Negative for shortness of breath.   Cardiovascular: Positive for leg swelling (chronic. The patient stated its better than prior. ). Negative for chest pain and palpitations.       Trace, no change, chronic.   Gastrointestinal: Negative for nausea, vomiting, abdominal pain, diarrhea, constipation and blood in stool.  Endocrine: Negative for polydipsia.  Genitourinary: Positive for frequency. Negative for dysuria, urgency, hematuria and flank pain.  Musculoskeletal: Positive for back pain. Negative for myalgias and neck pain.       Hand cram--warm water bottle helps at night.  Skin: Negative for rash.       Facial and right knee contusion.   Allergic/Immunologic: Negative for environmental allergies.  Neurological: Negative for dizziness, tremors, seizures and weakness.  Hematological: Does not bruise/bleed easily.  Psychiatric/Behavioral: Negative for hallucinations. The patient is not nervous/anxious.       Past Medical History  Diagnosis Date  . Arthritis   . CHF (congestive heart failure)   . Hypothyroidism   . HTN (hypertension), benign   . H/O: CVA (cerebrovascular accident)   . COPD (chronic obstructive pulmonary disease)   . Unspecified hearing loss 02/08/2013  . Unspecified constipation 11/02/2012  .  Candidiasis of other urogenital sites 08/10/2012  . Insomnia, unspecified 08/10/2012  . Pain in joint, ankle and foot 06/14/2012  . Other and unspecified hyperlipidemia 06/01/2012  . Contusion of face, scalp, and neck except eye(s) 06/01/2012  . Contusion of wrist 06/01/2012  . Edema 04/20/2012  . Open wound of knee, leg (except thigh), and ankle, without mention of complication 04/20/2012  . Closed fracture of sacrum and coccyx without mention of spinal cord injury 02/14/2012  . Unspecified hereditary and idiopathic peripheral neuropathy 01/27/2012  . Peripheral vascular disease, unspecified 01/27/2012  . Other disorder of coccyx 01/27/2012  . Personal history of fall 01/27/2012  . Ventricular fibrillation 01/24/2012  . Acute kidney failure, unspecified 01/24/2012  . Cerebral embolism 05/23/2005  . Pneumonia, organism unspecified 01/23/2005  . COPD, mild 01/20/2013  . History of cancer of uterus    Past Surgical History  Procedure Laterality Date  . Abdominal hysterectomy  1990    for endometrial cancer  . Tonsillectomy  1916  . Mastoidectomy  1920    bilateral  . Gum surgery  1932  . Ectopic pregnancy surgery  1952  . Thyroidectomy  1960  . Cataract extraction w/ intraocular lens  implant, bilateral     Social History:   reports that she has never smoked. She has never used smokeless tobacco. She reports that she does not drink alcohol or use illicit drugs.  Family History  Problem Relation Age of Onset  . Stroke Mother   . Heart disease Father     Medications: Patient's Medications  New Prescriptions   No medications on file  Previous Medications   ACETAMINOPHEN (TYLENOL) 325 MG TABLET    Take 650 mg by mouth every 4 (four) hours as needed for pain. Pain/headache   ASPIRIN 81 MG CHEWABLE TABLET    Chew 81 mg by mouth daily.   B COMPLEX-C (B-COMPLEX WITH VITAMIN C) TABLET    Take 1 tablet by mouth daily.   CALCIUM CITRATE-VITAMIN D (CITRACAL+D) 315-200 MG-UNIT PER TABLET    Take 1 tablet  by mouth daily.   DOCUSATE SODIUM (COLACE) 100 MG CAPSULE    Take 200 mg by mouth daily.    FENTANYL (DURAGESIC - DOSED MCG/HR) 25 MCG/HR PATCH    Apply one patch topically every 3 days. Remove old patch before applying new patch   FEXOFENADINE (ALLEGRA) 60 MG TABLET    Take 60 mg by mouth daily.   HYDROCODONE-ACETAMINOPHEN (NORCO/VICODIN) 5-325 MG PER TABLET    Take one tablet by mouth three times daily for pain   LEVOTHYROXINE (SYNTHROID, LEVOTHROID) 125 MCG TABLET    Take 125 mcg by mouth daily before breakfast.   MULTIPLE VITAMINS-MINERALS (EQL CENTURY MATURE) TABS    Take 1 tablet by mouth daily.   Modified Medications   No medications on file  Discontinued Medications   No medications  on file     Physical Exam: Physical Exam  Constitutional: She is oriented to person, place, and time. She appears well-developed and well-nourished. No distress.  HENT:  Head: Normocephalic and atraumatic.  Right Ear: External ear normal.  Left Ear: External ear normal.  Nose: Nose normal.  Mouth/Throat: Oropharynx is clear and moist. No oropharyngeal exudate.  Left TM tube seen.   Eyes: Conjunctivae and EOM are normal. Pupils are equal, round, and reactive to light. Right eye exhibits no discharge. Left eye exhibits no discharge.  Neck: Normal range of motion. Neck supple. No JVD present. No thyromegaly present.  Cardiovascular: Exam reveals no gallop and no friction rub.   No murmur heard. Pulmonary/Chest: Effort normal. No respiratory distress. She has rales in the right lower field and the left lower field. She exhibits no tenderness.  Diffused rhonchi  Abdominal: Soft. Bowel sounds are normal. She exhibits no distension and no mass. There is no tenderness. There is no guarding. No hernia.  Musculoskeletal: Normal range of motion. She exhibits edema (pedal and ankles. ) and tenderness (c/o lower back and knees pain. ).  Lymphadenopathy:    She has no cervical adenopathy.  Neurological: She is  alert and oriented to person, place, and time. She displays normal reflexes. No cranial nerve deficit. Coordination normal.  Skin: Skin is warm and dry. No rash noted. She is not diaphoretic. No erythema. No pallor.  Facial and right knee contusion.    Psychiatric: She has a normal mood and affect. Her behavior is normal. Judgment and thought content normal.  Some confusion    Filed Vitals:   05/29/15 1318  BP: 150/90  Pulse: 80  Temp: 97.5 F (36.4 C)  TempSrc: Tympanic  Resp: 20      Labs reviewed: Basic Metabolic Panel:  Recent Labs  16/10/96 11/05/14  NA 138  --   K 4.3  --   BUN 25*  --   CREATININE 0.8  --   TSH  --  3.96   Liver Function Tests:  Recent Labs  10/01/14  AST 16  ALT 11  ALKPHOS 45   No results for input(s): LIPASE, AMYLASE in the last 8760 hours. No results for input(s): AMMONIA in the last 8760 hours. CBC: No results for input(s): WBC, NEUTROABS, HGB, HCT, MCV, PLT in the last 8760 hours. Lipid Panel: No results for input(s): CHOL, HDL, LDLCALC, TRIG, CHOLHDL, LDLDIRECT in the last 8760 hours. Anemia Panel: No results for input(s): FOLATE, IRON, VITAMINB12 in the last 8760 hours.  Past Procedures:  01/18/13 CXR old granulomatous disease, no inflammatory consolidate, mild bibasilar atelectasis, mild cardiomegaly without pulmonary vascualr congestion or pleural effusion.   05/03/13 X-ray C spine: cervical spondylosis.   04/07/15 X-ray R knee  IMPRESSION: 1. Severe tricompartment degenerative change. Diffuse osteopenia. No acute bony abnormality.  2. Peripheral vascular disease.  Assessment/Plan Acute bronchitis 05/26/15 CXR no active cardiopulmonary disease suspeted.  05/29/15 congestive cough, greenish sputum production, feeling bad, Neb relieves symptoms temporarily, Doxy  bid x 10 days, Mucinex  bid x 5 days, Medrol dose pk as directed. Observe the patient.   Arthralgia Multiple sites. Takes Fentanyl 65mcg/hr. Continue  Norco prn in setting of falling and no apparent pain complained during today's visit.    Gait instability Led to her fall in her room which resulted in facial and right knee contusion. Risk for falling-close supervision needed.    Urinary frequency Persisted. Adult depend for incontinent care.    Neuropathic pain of  both legs Pain is better controlled, change Hydrocodone/ApAp 5/325mg  tid(hx of falling associated with Gabapentin use) to prn, continue Fentanyl 53mcg/hr transdermal patch. Pain mainly in back and knees. F/u  Ortho when the patient desires.    Constipation No problem on Colace 200mg  daily.    COPD, mild 05/26/15 CXR no active cardiopulmonary disease suspected.  Flare up-10 day doxy, 5 mucinex, and medrol dose pk.   Edema trace in ankles and feet. Clear lungs and regular heart beats with HR 70s. Decreased pedis dorsalis pulses, but warm to touch, no pigmentation in BLE.   Hypothyroidism Takes Levothyroxine po daily, last TSH 2.307 05/21/14, 11/05/14 TSH 3.964    HTN (hypertension) Mildly elevated sbp-150s-may consider Metoprolol 12.5mg  bid in setting of Hx of CHF     Family/ Staff Communication: observe the patient.   Goals of Care: [place]  Labs/tests ordered: none

## 2015-05-29 NOTE — Assessment & Plan Note (Signed)
05/26/15 CXR no active cardiopulmonary disease suspeted.  05/29/15 congestive cough, greenish sputum production, feeling bad, Neb relieves symptoms temporarily, Doxy 100mg  bid x 10 days, Mucinex 600mg  bid x 5 days, Medrol dose pk as directed. Observe the patient.

## 2015-05-29 NOTE — Assessment & Plan Note (Signed)
Persisted. Adult depend for incontinent care.    

## 2015-05-29 NOTE — Assessment & Plan Note (Signed)
05/26/15 CXR no active cardiopulmonary disease suspected.  Flare up-10 day doxy, 5 mucinex, and medrol dose pk.

## 2015-05-29 NOTE — Assessment & Plan Note (Signed)
Mildly elevated sbp-150s-may consider Metoprolol 12.5mg  bid in setting of Hx of CHF

## 2015-06-20 ENCOUNTER — Non-Acute Institutional Stay: Payer: Medicare PPO | Admitting: Nurse Practitioner

## 2015-06-20 ENCOUNTER — Encounter: Payer: Self-pay | Admitting: Nurse Practitioner

## 2015-06-20 DIAGNOSIS — I10 Essential (primary) hypertension: Secondary | ICD-10-CM

## 2015-06-20 DIAGNOSIS — G5792 Unspecified mononeuropathy of left lower limb: Secondary | ICD-10-CM

## 2015-06-20 DIAGNOSIS — G5791 Unspecified mononeuropathy of right lower limb: Secondary | ICD-10-CM | POA: Diagnosis not present

## 2015-06-20 DIAGNOSIS — R609 Edema, unspecified: Secondary | ICD-10-CM | POA: Diagnosis not present

## 2015-06-20 DIAGNOSIS — I8312 Varicose veins of left lower extremity with inflammation: Secondary | ICD-10-CM | POA: Diagnosis not present

## 2015-06-20 DIAGNOSIS — R350 Frequency of micturition: Secondary | ICD-10-CM | POA: Diagnosis not present

## 2015-06-20 DIAGNOSIS — E039 Hypothyroidism, unspecified: Secondary | ICD-10-CM | POA: Diagnosis not present

## 2015-06-20 DIAGNOSIS — I8311 Varicose veins of right lower extremity with inflammation: Secondary | ICD-10-CM

## 2015-06-20 DIAGNOSIS — I872 Venous insufficiency (chronic) (peripheral): Secondary | ICD-10-CM

## 2015-06-20 DIAGNOSIS — G5793 Unspecified mononeuropathy of bilateral lower limbs: Secondary | ICD-10-CM

## 2015-06-20 DIAGNOSIS — K5900 Constipation, unspecified: Secondary | ICD-10-CM | POA: Diagnosis not present

## 2015-06-20 NOTE — Progress Notes (Signed)
Patient ID: [name], female   DOB: [birth year], 81 y.o.   MRN: [id number]   Code Status: DNR  Allergies  Allergen Reactions  . Amoxicillin   . Avelox [Moxifloxacin]   . Erythromycin   . Monistat [Miconazole]   . Morphine And Related   . Orange Juice [Orange Oil]     Chief Complaint  Patient presents with  . Medical Management of Chronic Issues  . Acute Visit    foot pain R>L, sweeling ankles/feet.     HPI: Patient is a 81 y.o. female seen in the AL at [facility] today for foot pain-mainly in her right foot and chronic medical conditions.  Problem List Items Addressed This Visit    HTN (hypertension) (Chronic)    Mildly elevated sbp-150s-permissive blood pressure control.         Hypothyroidism (Chronic)    Takes Levothyroxine po daily, update TSH      Edema - Primary (Chronic)    Worsened, edematous, erythematous, warmth, and tenderness, c/o pain mainly in her right foot, will Ice pk, gentle diurese her, prn analgesics, update CBC, CMP, TSH, and uric acid. Observe      Constipation    No problem on Colace  daily.       Neuropathic pain of both legs    Pain is better controlled, continue Hydrocodone/ApAp 5/325mg  tid(hx of falling associated with Gabapentin use) to prn, continue Fentanyl 13mcg/hr transdermal patch. Pain mainly in back and knees, but today's c/o is pain in her right foot. F/u  Ortho when the patient desires.         Urinary frequency    Persisted. Adult depend for incontinent care.         Stasis dermatitis of both legs    Worse, gentle diurese her with Furosemide  daily, Kcl daily, update CMP         Review of Systems:  Review of Systems  Constitutional: Negative for fever, chills and diaphoresis.  HENT: Positive for hearing loss (severe, f/u audiology, recently tube placed in the left ear drum,  hears better after impacted ceruen removed wtih ear lavage. ). Negative for congestion, ear discharge,  ear pain and sore throat.   Eyes: Negative for photophobia, pain, discharge and redness.  Respiratory: Positive for cough. Negative for shortness of breath.   Cardiovascular: Positive for leg swelling (chronic. The patient stated its better than prior. ). Negative for chest pain and palpitations.       Worsened in edema and stasis dermatitis, erythema, scaly, and painful ankles and feet. R>L  Gastrointestinal: Negative for nausea, vomiting, abdominal pain, diarrhea, constipation and blood in stool.  Endocrine: Negative for polydipsia.  Genitourinary: Positive for frequency. Negative for dysuria, urgency, hematuria and flank pain.  Musculoskeletal: Positive for back pain. Negative for myalgias and neck pain.       Hand cram--warm water bottle helps at night.   Skin: Negative for rash.       Facial and right knee contusion.   Allergic/Immunologic: Negative for environmental allergies.  Neurological: Negative for dizziness, tremors, seizures and weakness.  Hematological: Does not bruise/bleed easily.  Psychiatric/Behavioral: Negative for hallucinations. The patient is not nervous/anxious.       Past Medical History  Diagnosis Date  . Arthritis   . CHF (congestive heart failure)   . Hypothyroidism   . HTN (hypertension), benign   . H/O: CVA (cerebrovascular accident)   . COPD (chronic obstructive pulmonary disease)   . Unspecified  hearing loss 02/08/2013  . Unspecified constipation 11/02/2012  . Candidiasis of other urogenital sites 08/10/2012  . Insomnia, unspecified 08/10/2012  . Pain in joint, ankle and foot 06/14/2012  . Other and unspecified hyperlipidemia 06/01/2012  . Contusion of face, scalp, and neck except eye(s) 06/01/2012  . Contusion of wrist 06/01/2012  . Edema 04/20/2012  . Open wound of knee, leg (except thigh), and ankle, without mention of complication 04/20/2012  . Closed fracture of sacrum and coccyx without mention of spinal cord injury 02/14/2012  . Unspecified hereditary  and idiopathic peripheral neuropathy 01/27/2012  . Peripheral vascular disease, unspecified 01/27/2012  . Other disorder of coccyx 01/27/2012  . Personal history of fall 01/27/2012  . Ventricular fibrillation 01/24/2012  . Acute kidney failure, unspecified 01/24/2012  . Cerebral embolism 05/23/2005  . Pneumonia, organism unspecified 01/23/2005  . COPD, mild 01/20/2013  . History of cancer of uterus    Past Surgical History  Procedure Laterality Date  . Abdominal hysterectomy  1990    for endometrial cancer  . Tonsillectomy  1916  . Mastoidectomy  1920    bilateral  . Gum surgery  1932  . Ectopic pregnancy surgery  1952  . Thyroidectomy  1960  . Cataract extraction w/ intraocular lens  implant, bilateral     Social History:   reports that she has never smoked. She has never used smokeless tobacco. She reports that she does not drink alcohol or use illicit drugs.  Family History  Problem Relation Age of Onset  . Stroke Mother   . Heart disease Father     Medications: Patient's Medications  New Prescriptions   No medications on file  Previous Medications   ACETAMINOPHEN (TYLENOL) 325 MG TABLET    Take 650 mg by mouth every 4 (four) hours as needed for pain. Pain/headache   ASPIRIN 81 MG CHEWABLE TABLET    Chew 81 mg by mouth daily.   B COMPLEX-C (B-COMPLEX WITH VITAMIN C) TABLET    Take 1 tablet by mouth daily.   CALCIUM CITRATE-VITAMIN D (CITRACAL+D) 315-200 MG-UNIT PER TABLET    Take 1 tablet by mouth daily.   DOCUSATE SODIUM (COLACE) 100 MG CAPSULE    Take 200 mg by mouth daily.    FENTANYL (DURAGESIC - DOSED MCG/HR) 25 MCG/HR PATCH    Apply one patch topically every 3 days. Remove old patch before applying new patch   FEXOFENADINE (ALLEGRA) 60 MG TABLET    Take 60 mg by mouth daily.   HYDROCODONE-ACETAMINOPHEN (NORCO/VICODIN) 5-325 MG PER TABLET    Take one tablet by mouth three times daily for pain   LEVOTHYROXINE (SYNTHROID, LEVOTHROID) 125 MCG TABLET    Take 125 mcg by mouth  daily before breakfast.   MULTIPLE VITAMINS-MINERALS (EQL CENTURY MATURE) TABS    Take 1 tablet by mouth daily.   Modified Medications   No medications on file  Discontinued Medications   No medications on file     Physical Exam: Physical Exam  Constitutional: She is oriented to person, place, and time. She appears well-developed and well-nourished. No distress.  HENT:  Head: Normocephalic and atraumatic.  Right Ear: External ear normal.  Left Ear: External ear normal.  Nose: Nose normal.  Mouth/Throat: Oropharynx is clear and moist. No oropharyngeal exudate.  Left TM tube seen.   Eyes: Conjunctivae and EOM are normal. Pupils are equal, round, and reactive to light. Right eye exhibits no discharge. Left eye exhibits no discharge.  Neck: Normal range of motion. Neck  supple. No JVD present. No thyromegaly present.  Cardiovascular: Exam reveals no gallop and no friction rub.   No murmur heard. Pulmonary/Chest: Effort normal. No respiratory distress. She has rales in the right lower field and the left lower field. She exhibits no tenderness.  Diffused rhonchi  Abdominal: Soft. Bowel sounds are normal. She exhibits no distension and no mass. There is no tenderness. There is no guarding. No hernia.  Musculoskeletal: Normal range of motion. She exhibits edema (pedal and ankles. ) and tenderness (c/o lower back and knees pain. ).  Worsened in edema and stasis dermatitis, erythema, scaly, and painful ankles and feet. R>L  Lymphadenopathy:    She has no cervical adenopathy.  Neurological: She is alert and oriented to person, place, and time. She displays normal reflexes. No cranial nerve deficit. Coordination normal.  Skin: Skin is warm and dry. No rash noted. She is not diaphoretic. No erythema. No pallor.  Worsened in edema and stasis dermatitis, erythema, scaly, and painful ankles and feet. R>L  Psychiatric: She has a normal mood and affect. Her behavior is normal. Judgment and thought  content normal.  Some confusion    Filed Vitals:   06/20/15 1801  BP: 122/66  Pulse: 60  Temp: 98.5 F (36.9 C)  TempSrc: Tympanic  Resp: 18      Labs reviewed: Basic Metabolic Panel:  Recent Labs  04/54/0910/20/15 11/05/14  NA 138  --   K 4.3  --   BUN 25*  --   CREATININE 0.8  --   TSH  --  3.96   Liver Function Tests:  Recent Labs  10/01/14  AST 16  ALT 11  ALKPHOS 45   No results for input(s): LIPASE, AMYLASE in the last 8760 hours. No results for input(s): AMMONIA in the last 8760 hours. CBC: No results for input(s): WBC, NEUTROABS, HGB, HCT, MCV, PLT in the last 8760 hours. Lipid Panel: No results for input(s): CHOL, HDL, LDLCALC, TRIG, CHOLHDL, LDLDIRECT in the last 8760 hours. Anemia Panel: No results for input(s): FOLATE, IRON, VITAMINB12 in the last 8760 hours.  Past Procedures:  01/18/13 CXR old granulomatous disease, no inflammatory consolidate, mild bibasilar atelectasis, mild cardiomegaly without pulmonary vascualr congestion or pleural effusion.   05/03/13 X-ray C spine: cervical spondylosis.   04/07/15 X-ray R knee  IMPRESSION: 1. Severe tricompartment degenerative change. Diffuse osteopenia. No acute bony abnormality.  2. Peripheral vascular disease.  Assessment/Plan Edema Worsened, edematous, erythematous, warmth, and tenderness, c/o pain mainly in her right foot, will Ice pk, gentle diurese her, prn analgesics, update CBC, CMP, TSH, and uric acid. Observe  Stasis dermatitis of both legs Worse, gentle diurese her with Furosemide 20mg  daily, Kcl 10meq daily, update CMP  Neuropathic pain of both legs Pain is better controlled, continue Hydrocodone/ApAp 5/325mg  tid(hx of falling associated with Gabapentin use) to prn, continue Fentanyl 4025mcg/hr transdermal patch. Pain mainly in back and knees, but today's c/o is pain in her right foot. F/u  Ortho when the patient desires.     Constipation No problem on Colace 200mg  daily.   HTN  (hypertension) Mildly elevated sbp-150s-permissive blood pressure control.     Hypothyroidism Takes Levothyroxine 125mcg po daily, update TSH  Urinary frequency Persisted. Adult depend for incontinent care.       Family/ Staff Communication: observe the patient.   Goals of Care: AL  Labs/tests ordered: CBC, CMP, TSH, uric acid.

## 2015-06-23 ENCOUNTER — Encounter: Payer: Self-pay | Admitting: Nurse Practitioner

## 2015-06-23 DIAGNOSIS — E039 Hypothyroidism, unspecified: Secondary | ICD-10-CM | POA: Diagnosis not present

## 2015-06-23 DIAGNOSIS — I509 Heart failure, unspecified: Secondary | ICD-10-CM | POA: Diagnosis not present

## 2015-06-23 NOTE — Assessment & Plan Note (Signed)
Worsened, edematous, erythematous, warmth, and tenderness, c/o pain mainly in her right foot, will Ice pk, gentle diurese her, prn analgesics, update CBC, CMP, TSH, and uric acid. Observe

## 2015-06-23 NOTE — Assessment & Plan Note (Signed)
Worse, gentle diurese her with Furosemide  daily, Kcl daily, update CMP

## 2015-06-23 NOTE — Assessment & Plan Note (Signed)
Takes Levothyroxine po daily, update TSH

## 2015-06-23 NOTE — Assessment & Plan Note (Signed)
Persisted. Adult depend for incontinent care.

## 2015-06-23 NOTE — Assessment & Plan Note (Signed)
No problem on Colace 200mg daily.    

## 2015-06-23 NOTE — Assessment & Plan Note (Signed)
Pain is better controlled, continue Hydrocodone/ApAp 5/325mg  tid(hx of falling associated with Gabapentin use) to prn, continue Fentanyl 6725mcg/hr transdermal patch. Pain mainly in back and knees, but today's c/o is pain in her right foot. F/u  Ortho when the patient desires.

## 2015-06-23 NOTE — Assessment & Plan Note (Signed)
Mildly elevated sbp-150s-permissive blood pressure control.

## 2015-06-24 LAB — HEPATIC FUNCTION PANEL
ALT: 14 U/L (ref 7–35)
AST: 19 U/L (ref 13–35)
Alkaline Phosphatase: 57 U/L (ref 25–125)
Bilirubin, Total: 0.4 mg/dL

## 2015-06-24 LAB — BASIC METABOLIC PANEL
BUN: 20 mg/dL (ref 4–21)
Creatinine: 0.8 mg/dL (ref 0.5–1.1)
Glucose: 93 mg/dL
Potassium: 4.1 mmol/L (ref 3.4–5.3)
Sodium: 138 mmol/L (ref 137–147)

## 2015-06-24 LAB — TSH: TSH: 5.59 u[IU]/mL (ref 0.41–5.90)

## 2015-06-26 ENCOUNTER — Other Ambulatory Visit: Payer: Self-pay | Admitting: Nurse Practitioner

## 2015-06-26 DIAGNOSIS — R609 Edema, unspecified: Secondary | ICD-10-CM

## 2015-06-26 DIAGNOSIS — E039 Hypothyroidism, unspecified: Secondary | ICD-10-CM

## 2015-07-10 DIAGNOSIS — I509 Heart failure, unspecified: Secondary | ICD-10-CM | POA: Diagnosis not present

## 2015-07-16 ENCOUNTER — Other Ambulatory Visit: Payer: Self-pay | Admitting: *Deleted

## 2015-07-16 MED ORDER — FENTANYL 25 MCG/HR TD PT72: MEDICATED_PATCH | TRANSDERMAL | Status: DC

## 2015-07-28 DIAGNOSIS — H6982 Other specified disorders of Eustachian tube, left ear: Secondary | ICD-10-CM | POA: Diagnosis not present

## 2015-07-28 DIAGNOSIS — H903 Sensorineural hearing loss, bilateral: Secondary | ICD-10-CM | POA: Diagnosis not present

## 2015-07-28 DIAGNOSIS — H6123 Impacted cerumen, bilateral: Secondary | ICD-10-CM | POA: Diagnosis not present

## 2015-09-30 DIAGNOSIS — E039 Hypothyroidism, unspecified: Secondary | ICD-10-CM | POA: Diagnosis not present

## 2015-09-30 LAB — TSH: TSH: 0.79 u[IU]/mL (ref 0.41–5.90)

## 2015-10-01 NOTE — Progress Notes (Signed)
This encounter was created in error - please disregard.

## 2015-10-02 ENCOUNTER — Other Ambulatory Visit: Payer: Self-pay | Admitting: Nurse Practitioner

## 2015-10-02 DIAGNOSIS — E039 Hypothyroidism, unspecified: Secondary | ICD-10-CM

## 2015-10-08 ENCOUNTER — Other Ambulatory Visit: Payer: Self-pay

## 2015-10-08 MED ORDER — FENTANYL 25 MCG/HR TD PT72: MEDICATED_PATCH | TRANSDERMAL | Status: DC

## 2015-10-08 NOTE — Telephone Encounter (Signed)
RX sent to [facility] pharmacy @ [phone], phone number [phone]. This is a nursing home refill request.   

## 2015-10-27 DIAGNOSIS — H6123 Impacted cerumen, bilateral: Secondary | ICD-10-CM | POA: Diagnosis not present

## 2015-10-27 DIAGNOSIS — H6982 Other specified disorders of Eustachian tube, left ear: Secondary | ICD-10-CM | POA: Diagnosis not present

## 2015-10-27 DIAGNOSIS — H61393 Other acquired stenosis of external ear canal, bilateral: Secondary | ICD-10-CM | POA: Diagnosis not present

## 2015-10-27 DIAGNOSIS — H903 Sensorineural hearing loss, bilateral: Secondary | ICD-10-CM | POA: Diagnosis not present

## 2015-11-03 ENCOUNTER — Observation Stay (HOSPITAL_COMMUNITY)
Admission: EM | Admit: 2015-11-03 | Discharge: 2015-11-06 | Disposition: A | Discharge status: Skilled Nursing Facility | Payer: Medicare PPO | Attending: Internal Medicine | Admitting: Internal Medicine

## 2015-11-03 ENCOUNTER — Encounter (HOSPITAL_COMMUNITY): Payer: Self-pay | Admitting: Family Medicine

## 2015-11-03 DIAGNOSIS — E039 Hypothyroidism, unspecified: Secondary | ICD-10-CM | POA: Diagnosis not present

## 2015-11-03 DIAGNOSIS — Z8542 Personal history of malignant neoplasm of other parts of uterus: Secondary | ICD-10-CM | POA: Diagnosis not present

## 2015-11-03 DIAGNOSIS — Z8673 Personal history of transient ischemic attack (TIA), and cerebral infarction without residual deficits: Secondary | ICD-10-CM | POA: Diagnosis not present

## 2015-11-03 DIAGNOSIS — Z90710 Acquired absence of both cervix and uterus: Secondary | ICD-10-CM | POA: Diagnosis not present

## 2015-11-03 DIAGNOSIS — R748 Abnormal levels of other serum enzymes: Secondary | ICD-10-CM | POA: Insufficient documentation

## 2015-11-03 DIAGNOSIS — I739 Peripheral vascular disease, unspecified: Secondary | ICD-10-CM | POA: Diagnosis not present

## 2015-11-03 DIAGNOSIS — E785 Hyperlipidemia, unspecified: Secondary | ICD-10-CM | POA: Insufficient documentation

## 2015-11-03 DIAGNOSIS — R1084 Generalized abdominal pain: Secondary | ICD-10-CM | POA: Diagnosis not present

## 2015-11-03 DIAGNOSIS — R109 Unspecified abdominal pain: Secondary | ICD-10-CM

## 2015-11-03 DIAGNOSIS — Z79891 Long term (current) use of opiate analgesic: Secondary | ICD-10-CM | POA: Diagnosis not present

## 2015-11-03 DIAGNOSIS — K8020 Calculus of gallbladder without cholecystitis without obstruction: Secondary | ICD-10-CM | POA: Diagnosis not present

## 2015-11-03 DIAGNOSIS — R7989 Other specified abnormal findings of blood chemistry: Secondary | ICD-10-CM

## 2015-11-03 DIAGNOSIS — Z79899 Other long term (current) drug therapy: Secondary | ICD-10-CM | POA: Diagnosis not present

## 2015-11-03 DIAGNOSIS — I110 Hypertensive heart disease with heart failure: Secondary | ICD-10-CM | POA: Diagnosis not present

## 2015-11-03 DIAGNOSIS — I10 Essential (primary) hypertension: Secondary | ICD-10-CM | POA: Diagnosis present

## 2015-11-03 DIAGNOSIS — F0390 Unspecified dementia without behavioral disturbance: Secondary | ICD-10-CM | POA: Diagnosis not present

## 2015-11-03 DIAGNOSIS — R945 Abnormal results of liver function studies: Secondary | ICD-10-CM | POA: Diagnosis not present

## 2015-11-03 DIAGNOSIS — J449 Chronic obstructive pulmonary disease, unspecified: Secondary | ICD-10-CM | POA: Insufficient documentation

## 2015-11-03 DIAGNOSIS — I509 Heart failure, unspecified: Secondary | ICD-10-CM | POA: Insufficient documentation

## 2015-11-03 DIAGNOSIS — M1990 Unspecified osteoarthritis, unspecified site: Secondary | ICD-10-CM | POA: Insufficient documentation

## 2015-11-03 DIAGNOSIS — Z7982 Long term (current) use of aspirin: Secondary | ICD-10-CM | POA: Diagnosis not present

## 2015-11-03 DIAGNOSIS — R110 Nausea: Secondary | ICD-10-CM

## 2015-11-03 DIAGNOSIS — K5900 Constipation, unspecified: Secondary | ICD-10-CM | POA: Diagnosis not present

## 2015-11-03 DIAGNOSIS — R112 Nausea with vomiting, unspecified: Secondary | ICD-10-CM | POA: Insufficient documentation

## 2015-11-03 DIAGNOSIS — R1011 Right upper quadrant pain: Secondary | ICD-10-CM | POA: Diagnosis not present

## 2015-11-03 NOTE — ED Notes (Signed)
[facility] Per EMS, patient is experiencing generalized abd pain for the last two days. Pt has mild nausea and denies vomiting, diarrhea, constipation, and fever. Pt has a FENTANYL patch to her right clavicle.

## 2015-11-03 NOTE — ED Notes (Signed)
Bed: WA19 Expected date:  Expected time:  Means of arrival:  Comments: EMS 

## 2015-11-04 ENCOUNTER — Emergency Department (HOSPITAL_COMMUNITY): Payer: Medicare PPO

## 2015-11-04 DIAGNOSIS — K8020 Calculus of gallbladder without cholecystitis without obstruction: Principal | ICD-10-CM

## 2015-11-04 DIAGNOSIS — I10 Essential (primary) hypertension: Secondary | ICD-10-CM | POA: Diagnosis not present

## 2015-11-04 DIAGNOSIS — J449 Chronic obstructive pulmonary disease, unspecified: Secondary | ICD-10-CM

## 2015-11-04 DIAGNOSIS — R110 Nausea: Secondary | ICD-10-CM | POA: Diagnosis present

## 2015-11-04 DIAGNOSIS — K8010 Calculus of gallbladder with chronic cholecystitis without obstruction: Secondary | ICD-10-CM

## 2015-11-04 DIAGNOSIS — R1084 Generalized abdominal pain: Secondary | ICD-10-CM | POA: Diagnosis not present

## 2015-11-04 DIAGNOSIS — E039 Hypothyroidism, unspecified: Secondary | ICD-10-CM

## 2015-11-04 DIAGNOSIS — R748 Abnormal levels of other serum enzymes: Secondary | ICD-10-CM

## 2015-11-04 DIAGNOSIS — R945 Abnormal results of liver function studies: Secondary | ICD-10-CM | POA: Insufficient documentation

## 2015-11-04 DIAGNOSIS — R7989 Other specified abnormal findings of blood chemistry: Secondary | ICD-10-CM

## 2015-11-04 HISTORY — DX: Calculus of gallbladder without cholecystitis without obstruction: K80.20

## 2015-11-04 LAB — COMPREHENSIVE METABOLIC PANEL
ALBUMIN: 3.4 g/dL — AB (ref 3.5–5.0)
ALK PHOS: 177 U/L — AB (ref 38–126)
ALT: 433 U/L — ABNORMAL HIGH (ref 14–54)
ANION GAP: 8 (ref 5–15)
AST: 171 U/L — AB (ref 15–41)
BILIRUBIN TOTAL: 1.7 mg/dL — AB (ref 0.3–1.2)
BUN: 28 mg/dL — AB (ref 6–20)
CO2: 28 mmol/L (ref 22–32)
Calcium: 8.9 mg/dL (ref 8.9–10.3)
Chloride: 101 mmol/L (ref 101–111)
Creatinine, Ser: 0.77 mg/dL (ref 0.44–1.00)
GFR calc Af Amer: >60 mL/min (ref >60)
GFR calc non Af Amer: >60 mL/min (ref >60)
GLUCOSE: 120 mg/dL — AB (ref 65–99)
Potassium: 3.9 mmol/L (ref 3.5–5.1)
SODIUM: 137 mmol/L (ref 135–145)
TOTAL PROTEIN: 6.5 g/dL (ref 6.5–8.1)

## 2015-11-04 LAB — URINALYSIS, ROUTINE W REFLEX MICROSCOPIC
Bilirubin Urine: NEGATIVE
Glucose, UA: NEGATIVE mg/dL
Hgb urine dipstick: NEGATIVE
Ketones, ur: NEGATIVE mg/dL
LEUKOCYTES UA: NEGATIVE
NITRITE: NEGATIVE
Protein, ur: NEGATIVE mg/dL
SPECIFIC GRAVITY, URINE: 1.021 (ref 1.005–1.030)
pH: 5.5 (ref 5.0–8.0)

## 2015-11-04 LAB — CBC
HEMATOCRIT: 46.1 % — AB (ref 36.0–46.0)
Hemoglobin: 15.1 g/dL — ABNORMAL HIGH (ref 12.0–15.0)
MCH: 30.3 pg (ref 26.0–34.0)
MCHC: 32.8 g/dL (ref 30.0–36.0)
MCV: 92.6 fL (ref 78.0–100.0)
Platelets: 177 10*3/uL (ref 150–400)
RBC: 4.98 MIL/uL (ref 3.87–5.11)
RDW: 14.2 % (ref 11.5–15.5)
WBC: 7.9 10*3/uL (ref 4.0–10.5)

## 2015-11-04 LAB — MRSA PCR SCREENING: MRSA by PCR: NEGATIVE

## 2015-11-04 LAB — TSH: TSH: 0.712 u[IU]/mL (ref 0.350–4.500)

## 2015-11-04 LAB — LIPASE, BLOOD: Lipase: 24 U/L (ref 11–51)

## 2015-11-04 MED ORDER — AZTREONAM 1 G IJ SOLR: 1.0000 g | Freq: Three times a day (TID) | INTRAMUSCULAR | Status: DC

## 2015-11-04 MED ORDER — ENOXAPARIN SODIUM 40 MG/0.4ML ~~LOC~~ SOLN
40.0000 mg | SUBCUTANEOUS | Status: DC
  Administered 2015-11-04 – 2015-11-05 (×2): 40 mg via SUBCUTANEOUS
  Filled 2015-11-04 (×3): qty 0.4

## 2015-11-04 MED ORDER — ACETAMINOPHEN 325 MG PO TABS: 650.0000 mg | ORAL_TABLET | ORAL | Status: DC | PRN

## 2015-11-04 MED ORDER — DOCUSATE SODIUM 100 MG PO CAPS
200.0000 mg | ORAL_CAPSULE | Freq: Every day | ORAL | Status: DC
  Administered 2015-11-04 – 2015-11-06 (×3): 200 mg via ORAL
  Filled 2015-11-04 (×3): qty 2

## 2015-11-04 MED ORDER — B COMPLEX-C PO TABS
1.0000 | ORAL_TABLET | Freq: Every day | ORAL | Status: DC
  Administered 2015-11-04 – 2015-11-06 (×3): 1 via ORAL
  Filled 2015-11-04 (×3): qty 1

## 2015-11-04 MED ORDER — ASPIRIN 81 MG PO CHEW
81.0000 mg | CHEWABLE_TABLET | Freq: Every day | ORAL | Status: DC
  Administered 2015-11-04 – 2015-11-06 (×3): 81 mg via ORAL
  Filled 2015-11-04 (×3): qty 1

## 2015-11-04 MED ORDER — FUROSEMIDE 20 MG PO TABS
20.0000 mg | ORAL_TABLET | Freq: Every day | ORAL | Status: DC
  Administered 2015-11-04 – 2015-11-06 (×3): 20 mg via ORAL
  Filled 2015-11-04 (×3): qty 1

## 2015-11-04 MED ORDER — SODIUM CHLORIDE 0.9 % IV SOLN
INTRAVENOUS | Status: DC
  Administered 2015-11-05 (×2): via INTRAVENOUS

## 2015-11-04 MED ORDER — FENTANYL CITRATE (PF) 100 MCG/2ML IJ SOLN
25.0000 ug | Freq: Once | INTRAMUSCULAR | Status: AC
  Administered 2015-11-04: 25 ug via INTRAVENOUS
  Filled 2015-11-04: qty 2

## 2015-11-04 MED ORDER — HYDRALAZINE HCL 20 MG/ML IJ SOLN: 5.0000 mg | Freq: Four times a day (QID) | INTRAMUSCULAR | Status: DC | PRN

## 2015-11-04 MED ORDER — HYDROCODONE-ACETAMINOPHEN 5-325 MG PO TABS
1.0000 | ORAL_TABLET | Freq: Three times a day (TID) | ORAL | Status: DC | PRN
  Administered 2015-11-04 (×2): 1 via ORAL
  Filled 2015-11-04 (×2): qty 1

## 2015-11-04 MED ORDER — LORATADINE 10 MG PO TABS
10.0000 mg | ORAL_TABLET | Freq: Every day | ORAL | Status: DC
  Administered 2015-11-04 – 2015-11-06 (×3): 10 mg via ORAL
  Filled 2015-11-04 (×3): qty 1

## 2015-11-04 MED ORDER — SODIUM CHLORIDE 0.9 % IV SOLN
INTRAVENOUS | Status: DC
  Administered 2015-11-04: 07:00:00 via INTRAVENOUS

## 2015-11-04 MED ORDER — MAGNESIUM HYDROXIDE 400 MG/5ML PO SUSP: 30.0000 mL | Freq: Every day | ORAL | Status: DC | PRN

## 2015-11-04 MED ORDER — ALUM & MAG HYDROXIDE-SIMETH 200-200-20 MG/5ML PO SUSP: 30.0000 mL | Freq: Four times a day (QID) | ORAL | Status: DC | PRN

## 2015-11-04 MED ORDER — POTASSIUM CHLORIDE CRYS ER 10 MEQ PO TBCR
10.0000 meq | EXTENDED_RELEASE_TABLET | Freq: Every day | ORAL | Status: DC
  Administered 2015-11-04 – 2015-11-06 (×3): 10 meq via ORAL
  Filled 2015-11-04 (×3): qty 1

## 2015-11-04 MED ORDER — SODIUM CHLORIDE 0.9 % IV BOLUS (SEPSIS)
700.0000 mL | Freq: Once | INTRAVENOUS | Status: AC
  Administered 2015-11-04: 700 mL via INTRAVENOUS

## 2015-11-04 MED ORDER — LEVOTHYROXINE SODIUM 150 MCG PO TABS
150.0000 ug | ORAL_TABLET | Freq: Every day | ORAL | Status: DC
  Administered 2015-11-04 – 2015-11-06 (×3): 150 ug via ORAL
  Filled 2015-11-04 (×3): qty 1

## 2015-11-04 MED ORDER — FENTANYL 12 MCG/HR TD PT72
12.5000 ug | MEDICATED_PATCH | TRANSDERMAL | Status: DC
  Administered 2015-11-06: 12.5 ug via TRANSDERMAL
  Filled 2015-11-04: qty 1

## 2015-11-04 MED ORDER — DEXTROSE 5 % IV SOLN
1.0000 g | Freq: Three times a day (TID) | INTRAVENOUS | Status: DC
  Administered 2015-11-04 – 2015-11-06 (×7): 1 g via INTRAVENOUS
  Filled 2015-11-04 (×9): qty 1

## 2015-11-04 MED ORDER — CALCIUM CARBONATE-VITAMIN D 500-200 MG-UNIT PO TABS
1.0000 | ORAL_TABLET | Freq: Every day | ORAL | Status: DC
  Administered 2015-11-04 – 2015-11-06 (×3): 1 via ORAL
  Filled 2015-11-04 (×3): qty 1

## 2015-11-04 NOTE — Care Management Note (Signed)
Case Management Note  Patient Details  Name: [name] MRN: [id number] Date of Birth: [birth year]  Subjective/Objective:  [age] y/o f admitted w/Cholelithiasis. [facility]PT cons-await recc.   CSW following.               Action/Plan:d/c plan return back to ALF.   Expected Discharge Date:                  Expected Discharge Plan:  Home w Home Health Services  In-House Referral:  Clinical Social Work  Discharge planning Services  CM Consult  Post Acute Care Choice:    Choice offered to:     DME Arranged:    DME Agency:     HH Arranged:    HH Agency:     Status of Service:  In process, will continue to follow  Medicare Important Message Given:    Date Medicare IM Given:    Medicare IM give by:    Date Additional Medicare IM Given:    Additional Medicare Important Message give by:     If discussed at Long Length of Stay Meetings, dates discussed:    Additional Comments:  Lanier ClamMahabir, Shawnda Mauney, RN 11/04/2015, 11:29 AM

## 2015-11-04 NOTE — ED Notes (Signed)
Gave report to Lisa RN

## 2015-11-04 NOTE — H&P (Signed)
[facility] History and Physical  REIGAN TOLLIVER ZOX:[id number] DOB: 09/01/1911 DOA: 11/03/2015  Referring physician: ER PCP: Kimber Relic, MD   Chief Complaint: Generalized abdominal pain  HPI:  Patient is 81 year old female with a past medical history significant for CHF, hypertension, arthritis; who presents with complaints of gradually worsening generalized abdominal pain over the last 2 days. Symptoms per patient reports started after she had eaten some ham. Describes pain as a dull pulling sensation. Symptoms appear to be constant and can wax and wane in intensity. She reports associated symptoms of nausea, reports one episode of vomiting, and loose bowels. Upon arrival into the emergency department initial lab work showed elevated LFTs. CT scan showed signs of cholelithiasis with mild gallbladder distention. Ultrasound abdomen showed cholelithiasis with gallbladder sludge and no bile duct dilatation.     Review of Systems  Constitutional: Negative for fever and chills.  HENT: Positive for hearing loss. Negative for nosebleeds.   Eyes: Negative for photophobia and discharge.  Respiratory: Negative for cough and sputum production.   Cardiovascular: Negative for chest pain and palpitations.  Gastrointestinal: Positive for nausea, vomiting and abdominal pain.  Genitourinary: Negative for dysuria and hematuria.  Musculoskeletal: Positive for joint pain. Negative for falls.  Neurological: Negative for speech change and focal weakness.  Endo/Heme/Allergies: Positive for environmental allergies. Negative for polydipsia.  Psychiatric/Behavioral: Positive for memory loss. Negative for suicidal ideas.       Past Medical History  Diagnosis Date  . Arthritis   . CHF (congestive heart failure) (HCC)   . Hypothyroidism   . HTN (hypertension), benign   . H/O: CVA (cerebrovascular accident)   . COPD (chronic obstructive pulmonary disease) (HCC)   . Unspecified hearing loss  02/08/2013  . Unspecified constipation 11/02/2012  . Candidiasis of other urogenital sites 08/10/2012  . Insomnia, unspecified 08/10/2012  . Pain in joint, ankle and foot 06/14/2012  . Other and unspecified hyperlipidemia 06/01/2012  . Contusion of face, scalp, and neck except eye(s) 06/01/2012  . Contusion of wrist 06/01/2012  . Edema 04/20/2012  . Open wound of knee, leg (except thigh), and ankle, without mention of complication 04/20/2012  . Closed fracture of sacrum and coccyx without mention of spinal cord injury 02/14/2012  . Unspecified hereditary and idiopathic peripheral neuropathy 01/27/2012  . Peripheral vascular disease, unspecified (HCC) 01/27/2012  . Other disorder of coccyx 01/27/2012  . Personal history of fall 01/27/2012  . Ventricular fibrillation (HCC) 01/24/2012  . Acute kidney failure, unspecified (HCC) 01/24/2012  . Cerebral embolism 05/23/2005  . Pneumonia, organism unspecified 01/23/2005  . COPD, mild (HCC) 01/20/2013  . History of cancer of uterus      Past Surgical History  Procedure Laterality Date  . Abdominal hysterectomy  1990    for endometrial cancer  . Tonsillectomy  1916  . Mastoidectomy  1920    bilateral  . Gum surgery  1932  . Ectopic pregnancy surgery  1952  . Thyroidectomy  1960  . Cataract extraction w/ intraocular lens  implant, bilateral        Social History:  reports that she has never smoked. She has never used smokeless tobacco. She reports that she does not drink alcohol or use illicit drugs.  where does patient live--[facility]   Can patient participate in ADLs? YES  Allergies  Allergen Reactions  . Amoxicillin     Unknown, patient unable to answer questionnaire   . Avelox [Moxifloxacin]     Unknown: listed on MAR  . Erythromycin  Unknown: listed on MAR  . Monistat [Miconazole]     Unknown: listed on MAR  . Morphine And Related     Unknown: listed on MAR  . Orange Juice [Orange Oil]     Unknown: listed on MAR    Family History  Problem  Relation Age of Onset  . Stroke Mother   . Heart disease Father      Prior to Admission medications   Medication Sig Start Date End Date Taking? Authorizing Provider  alum & mag hydroxide-simeth (MAALOX/MYLANTA) 200-200-20 MG/5ML suspension Take 30 mLs by mouth every 6 (six) hours as needed for indigestion or heartburn.   Yes Historical Provider, MD  aspirin 81 MG chewable tablet Chew 81 mg by mouth daily.   Yes Historical Provider, MD  B Complex-C (B-COMPLEX WITH VITAMIN C) tablet Take 1 tablet by mouth daily.   Yes Historical Provider, MD  calcium citrate-vitamin D (CITRACAL+D) 315-200 MG-UNIT per tablet Take 1 tablet by mouth daily.   Yes Historical Provider, MD  docusate sodium (COLACE) 100 MG capsule Take 200 mg by mouth daily.    Yes Historical Provider, MD  fentaNYL (DURAGESIC - DOSED MCG/HR) 25 MCG/HR patch Apply one patch topically every 3 days. Remove old patch before applying new patch 10/08/15  Yes Kimber Relic, MD  fexofenadine (ALLEGRA) 60 MG tablet Take 60 mg by mouth daily.   Yes Historical Provider, MD  furosemide (LASIX) 20 MG tablet Take 1 tablet by mouth daily. 10/10/15  Yes Historical Provider, MD  levothyroxine (SYNTHROID, LEVOTHROID) 150 MCG tablet Take 1 tablet by mouth daily. 10/14/15  Yes Historical Provider, MD  magnesium hydroxide (MILK OF MAGNESIA) 400 MG/5ML suspension Take 30 mLs by mouth daily as needed for mild constipation.   Yes Historical Provider, MD  Multiple Vitamins-Minerals (EQL CENTURY MATURE) TABS Take 1 tablet by mouth daily.    Yes Historical Provider, MD  potassium chloride (K-DUR,KLOR-CON) 10 MEQ tablet Take 1 tablet by mouth daily. 10/11/15  Yes Historical Provider, MD  acetaminophen (TYLENOL) 325 MG tablet Take 650 mg by mouth every 4 (four) hours as needed for pain. Pain/headache    Historical Provider, MD  HYDROcodone-acetaminophen (NORCO/VICODIN) 5-325 MG per tablet Take one tablet by mouth three times daily for pain 03/17/15   Sharon Seller,  NP     Physical Exam: Filed Vitals:   11/03/15 2338 11/03/15 2342 11/04/15 0226 11/04/15 0435  BP: 177/67  184/74 161/73  Pulse: 80  65 94  Temp: 97.4 F (36.3 C)  98.1 F (36.7 C)   TempSrc: Oral  Oral   Resp: Height:   (1.676 m)    Weight:  73.029 kg (161 lb)    SpO2: 100%  95% 96%     Constitutional: Vital signs reviewed. Patient is a well-developed and well-nourished in no acute distress and cooperative with exam. Alert and oriented x3.  Head: Normocephalic and atraumatic  Ear: TM normal bilaterally  Mouth: no erythema or exudates, MMM  Eyes: PERRL, EOMI, conjunctivae normal, No scleral icterus.  Neck: Supple, Trachea midline normal ROM, No JVD, mass, thyromegaly, or carotid bruit present.  Cardiovascular: RRR, S1 normal, S2 normal, no MRG, pulses symmetric and intact bilaterally  Pulmonary/Chest: CTAB, no wheezes, rales, or rhonchi  Abdominal: Soft, nontender, non-distended, bowel sounds are normal, no masses, organomegaly, or guarding present.  GU: no CVA tenderness Musculoskeletal: No joint deformities, erythema, or stiffness, ROM full and no nontender Ext: no edema and no cyanosis,  pulses palpable bilaterally (DP and PT)  Hematology: no cervical, inginal, or axillary adenopathy.  Neurological: A&O x3, Strenght is normal and symmetric bilaterally, cranial nerve II-XII are grossly intact, no focal motor deficit, sensory intact to light touch bilaterally.  Skin: Warm, dry and intact. No rash, cyanosis, or clubbing.  Psychiatric: Normal mood and affect. speech and behavior is normal. Judgment and thought content normal. Cognition and memory are normal.      Data Review   Micro Results No results found for this or any previous visit (from the past 240 hour(s)).  Radiology Reports Ct Abdomen Pelvis Wo Contrast  11/04/2015  CLINICAL DATA:  Increasing general abdominal pain for 2 days. Nausea. Pain with urination. History of uterine cancer. EXAM: CT  ABDOMEN AND PELVIS WITHOUT CONTRAST TECHNIQUE: Multidetector CT imaging of the abdomen and pelvis was performed following the standard protocol without IV contrast. COMPARISON:  08/07/2004 FINDINGS: Atelectasis in the lung bases. Bronchial wall thickening suggesting bronchitis. Coronary artery calcifications. Cholelithiasis with mild gallbladder distention. No bile duct dilatation. Multiple lesions demonstrated in the left kidney likely representing parenchymal and parapelvic cysts. No hydronephrosis or hydroureter in either kidney. No renal, ureteral, or bladder stones. Bladder is decompressed and cannot be evaluated for wall thickness. The unenhanced appearance of the liver, spleen, pancreas, adrenal glands, inferior vena cava, and retroperitoneal lymph nodes is unremarkable. Calcification of abdominal aorta without aneurysm. Stomach, small bowel, and colon are decompressed. Scattered stool in the colon. No free air or free fluid in the abdomen. Pelvis: Appendix is not identified. Uterus is surgically absent. No mass or adenopathy in the pelvis. Diverticulosis of the sigmoid colon without diverticulitis. No free or loculated pelvic fluid collections. Degenerative changes in the spine. Compression deformities of L1 and L2. These findings are unchanged since previous MRI lumbar spine 02/04/2012. IMPRESSION: Bronchial wall thickening in the lung bases suggesting bronchitis. Cholelithiasis with mild gallbladder distention. No renal or ureteral stone or obstruction. Electronically Signed   By: Burman Nieves M.D.   On: 11/04/2015 02:13   US Abdomen Limited Ruq  11/04/2015  CLINICAL DATA:  Abdominal pain for 2 days. Elevated liver function studies. EXAM: US ABDOMEN LIMITED - RIGHT UPPER QUADRANT COMPARISON:  CT abdomen and pelvis 11/04/2015 FINDINGS: Gallbladder: Cholelithiasis with several stones in the gallbladder, largest measuring 8 mm. Gallbladder sludge. Gallbladder wall is upper limits of normal for  thickness. Murphy's sign is negative. Common bile duct: Diameter: 3 mm, normal Liver: No focal lesion identified. Within normal limits in parenchymal echogenicity. IMPRESSION: Cholelithiasis with gallbladder sludge.  No bile duct dilatation. Electronically Signed   By: Burman Nieves M.D.   On: 11/04/2015 03:56     CBC  Recent Labs Lab 11/04/15 0015  WBC 7.9  HGB 15.1*  HCT 46.1*  PLT 177  MCV 92.6  MCH 30.3  MCHC 32.8  RDW 14.2    Chemistries   Recent Labs Lab 11/04/15 0140  NA 137  K 3.9  CL 101  CO2 28  GLUCOSE 120*  BUN 28*  CREATININE 0.77  CALCIUM 8.9  AST 171*  ALT 433*  ALKPHOS 177*  BILITOT 1.7*   ------------------------------------------------------------------------------------------------------------------ estimated creatinine clearance is 36.3 mL/min (by C-G formula based on Cr of 0.77). ------------------------------------------------------------------------------------------------------------------ No results for input(s): HGBA1C in the last 72 hours. ------------------------------------------------------------------------------------------------------------------ No results for input(s): CHOL, HDL, LDLCALC, TRIG, CHOLHDL, LDLDIRECT in the last 72 hours. ------------------------------------------------------------------------------------------------------------------ No results for input(s): TSH, T4TOTAL, T3FREE, THYROIDAB in the last 72 hours.  Invalid input(s): FREET3 ------------------------------------------------------------------------------------------------------------------ No  results for input(s): VITAMINB12, FOLATE, FERRITIN, TIBC, IRON, RETICCTPCT in the last 72 hours.  Coagulation profile No results for input(s): INR, PROTIME in the last 168 hours.  No results for input(s): DDIMER in the last 72 hours.  Cardiac Enzymes No results for input(s): CKMB, TROPONINI, MYOGLOBIN in the last 168 hours.  Invalid input(s):  CK ------------------------------------------------------------------------------------------------------------------ Invalid input(s): POCBNP   CBG: No results for input(s): GLUCAP in the last 168 hours.     EKG: Independently reviewed.    Assessment/Plan Principal Problem:  Cholelithiasis: New problem. Patient appears to have no acute signs of cholecystitis on CT or US. Suspected there could have been a stone which was dislodged causing patient have elevated LFTs. Patient received 1 dose of aztreonam in ED -Admit patient for observation  -IV fluids at 50 mL/hour. Discontinue when tolerating by mouth medications -Question need for continuation of antibiotics.  Nausea and vomiting: Acute. -prn Zofran for nausea -Advance diet as tolerated  Generalized abdominal pain: Acute. suspected secondary to the above problem -See above  Elevated liver enzymes: acute. Previously within normal limits. On admission however total bilirubin elevated at 1.7,AST 171, ALT 433. -trend liver enzymes  HTN (hypertension) -Continue Lasix    Hypothyroidism:Chronic -Continue home levothyroxine dose -chesk tsh  COPD, mild (HCC) Stable  Consults:physical therapy  Lovenox for DVT prophylaxis  Code Status:   full Family Communication: bedside Disposition Plan: admit   Total time spent 55 minutes.Greater than 50% of this time was spent in counseling, explanation of diagnosis, planning of further management, and coordination of care  Clydie BraunRondell A Alhaji Mcneal [facility] Pager [phone]  If 7PM-7AM, please contact night-coverage [email or web address] Password [facility] 11/04/2015, 5:58 AM

## 2015-11-04 NOTE — Progress Notes (Signed)
[facility]-  Full assessment to follow. Awaiting and anticipating [facility]   Reece LevyJanet Angenette Daily, MSW, Theresia MajorsLCSWA  [phone]

## 2015-11-04 NOTE — Progress Notes (Addendum)
Patient admitted after midnight. Please refer to admission note done today 11/04/2015  Patient admitted for abdominal pain for past 2 days prior to this admission. Patient reported constant, dull generalized abdominal pain worse after eating. She had one episode of nonbloody vomiting. No fevers or chills. Abdominal ultrasound showed cholelithiasis with gallbladder sludge but no bile duct dilatation. CT abdomen confirmed cholelithiasis with mild gallbladder distention. Blood work revealed AST 171, ALT 433, ALP 177 and total bilirubin 1.7.  Assessment and plan  Cholelithiasis / possible acute cholecystitis / abnormal liver function enzymes - Abdominal ultrasound and CT abdomen show cholelithiasis with gallbladder sludge but no bile duct dilatation. Doubt acute cholecystitis - Elevated liver enzymes likely secondary to possible passed stone - As noted above, AST 171, ALT 433, ALP 177 and total bilirubin 1.7. Trend LFTs. - Continue current antibiotics, aztreonam - May advance diet to clear liquids.  [name] [facility] [phone]

## 2015-11-04 NOTE — Care Management Note (Signed)
Case Management Note  Patient Details  Name: [name] MRN: [id number] Date of Birth: August 25, 1911  Subjective/Objective:  TC [facility]-tel#[phone].they use [facility]-tel#[phone]/fax#[phone] [id number].PT cons-await recc.                  Action/Plan:d/c [facility].   Expected Discharge Date:                  Expected Discharge Plan:  Home w Home Health Services  In-House Referral:  Clinical Social Work  Discharge planning Services  CM Consult  Post Acute Care Choice:    Choice offered to:     DME Arranged:    DME Agency:     HH Arranged:    HH Agency:     Status of Service:  In process, will continue to follow  Medicare Important Message Given:    Date Medicare IM Given:    Medicare IM give by:    Date Additional Medicare IM Given:    Additional Medicare Important Message give by:     If discussed at Long Length of Stay Meetings, dates discussed:    Additional Comments:  Lanier ClamMahabir, Daniell Mancinas, RN 11/04/2015, 11:53 AM

## 2015-11-04 NOTE — ED Notes (Signed)
Ultrasound in progress  

## 2015-11-04 NOTE — ED Provider Notes (Signed)
CSN: [id number]     Arrival date & time 11/03/15  2336 History  By signing my name below, I, Emmanuella Mensah, attest that this documentation has been prepared under the direction and in the presence of Devoria Albe, MD at 0110. Electronically Signed: Angelene Giovanni, ED Scribe. 11/04/2015. 1:46 AM.     Chief Complaint  Patient presents with  . Abdominal Pain   The history is provided by the patient. No language interpreter was used.   Level V caveat due to age  HPI Comments: [name] is a 81 y.o. female with a hx of CHF, arthritis, hysterectomy, ectopic pregnancy surgery brought in by ambulance, who presents to the Emergency Department complaining of a gradually worsening waxing and waning "pressure" generalized abdominal pain onset 2 days ago. She reports associated nausea and pain while urinating. She denies any fever, vomiting, diarrhea, or constipation. She states that she has had a decrease in her appetite, only having a glass of warm milk yesterday and fruit jello today.  No alleviating factors noted. She states she's never had this pain before. She has dysuria "sometimes", and states that sometimes she is role on urination. She denies diarrhea or constipation.  PCP Dr Gordy Levan   Past Medical History  Diagnosis Date  . Arthritis   . CHF (congestive heart failure) (HCC)   . Hypothyroidism   . HTN (hypertension), benign   . H/O: CVA (cerebrovascular accident)   . COPD (chronic obstructive pulmonary disease) (HCC)   . Unspecified hearing loss 02/08/2013  . Unspecified constipation 11/02/2012  . Candidiasis of other urogenital sites 08/10/2012  . Insomnia, unspecified 08/10/2012  . Pain in joint, ankle and foot 06/14/2012  . Other and unspecified hyperlipidemia 06/01/2012  . Contusion of face, scalp, and neck except eye(s) 06/01/2012  . Contusion of wrist 06/01/2012  . Edema 04/20/2012  . Open wound of knee, leg (except thigh), and ankle, without mention of complication 04/20/2012   . Closed fracture of sacrum and coccyx without mention of spinal cord injury 02/14/2012  . Unspecified hereditary and idiopathic peripheral neuropathy 01/27/2012  . Peripheral vascular disease, unspecified (HCC) 01/27/2012  . Other disorder of coccyx 01/27/2012  . Personal history of fall 01/27/2012  . Ventricular fibrillation (HCC) 01/24/2012  . Acute kidney failure, unspecified (HCC) 01/24/2012  . Cerebral embolism 05/23/2005  . Pneumonia, organism unspecified 01/23/2005  . COPD, mild (HCC) 01/20/2013  . History of cancer of uterus    Past Surgical History  Procedure Laterality Date  . Abdominal hysterectomy  1990    for endometrial cancer  . Tonsillectomy  1916  . Mastoidectomy  1920    bilateral  . Gum surgery  1932  . Ectopic pregnancy surgery  1952  . Thyroidectomy  1960  . Cataract extraction w/ intraocular lens  implant, bilateral     Family History  Problem Relation Age of Onset  . Stroke Mother   . Heart disease Father    Social History  Substance Use Topics  . Smoking status: Never Smoker   . Smokeless tobacco: Never Used  . Alcohol Use: No  live in [place] States afraid to stand up b/o fear of falling She reports that she normally walks with a cane.   OB History    No data available     Review of Systems  Constitutional: Positive for appetite change. Negative for fever.  Gastrointestinal: Positive for nausea and abdominal pain. Negative for vomiting, diarrhea and constipation.  Genitourinary:  Pain with urination  All other systems reviewed and are negative.     Allergies  Amoxicillin; Avelox; Erythromycin; Monistat; Morphine and related; and Orange juice  Home Medications   Prior to Admission medications   Medication Sig Start Date End Date Taking? Authorizing Provider  alum & mag hydroxide-simeth (MAALOX/MYLANTA) 200-200-20 MG/5ML suspension Take 30 mLs by mouth every 6 (six) hours as needed for indigestion or heartburn.   Yes Historical Provider, MD   aspirin 81 MG chewable tablet Chew 81 mg by mouth daily.   Yes Historical Provider, MD  B Complex-C (B-COMPLEX WITH VITAMIN C) tablet Take 1 tablet by mouth daily.   Yes Historical Provider, MD  calcium citrate-vitamin D (CITRACAL+D) 315-200 MG-UNIT per tablet Take 1 tablet by mouth daily.   Yes Historical Provider, MD  docusate sodium (COLACE) 100 MG capsule Take 200 mg by mouth daily.    Yes Historical Provider, MD  fentaNYL (DURAGESIC - DOSED MCG/HR) 25 MCG/HR patch Apply one patch topically every 3 days. Remove old patch before applying new patch 10/08/15  Yes Kimber Relic, MD  fexofenadine (ALLEGRA) 60 MG tablet Take 60 mg by mouth daily.   Yes Historical Provider, MD  furosemide (LASIX) 20 MG tablet Take 1 tablet by mouth daily. 10/10/15  Yes Historical Provider, MD  levothyroxine (SYNTHROID, LEVOTHROID) 150 MCG tablet Take 1 tablet by mouth daily. 10/14/15  Yes Historical Provider, MD  magnesium hydroxide (MILK OF MAGNESIA) 400 MG/5ML suspension Take 30 mLs by mouth daily as needed for mild constipation.   Yes Historical Provider, MD  Multiple Vitamins-Minerals (EQL CENTURY MATURE) TABS Take 1 tablet by mouth daily.    Yes Historical Provider, MD  potassium chloride (K-DUR,KLOR-CON) 10 MEQ tablet Take 1 tablet by mouth daily. 10/11/15  Yes Historical Provider, MD  acetaminophen (TYLENOL) 325 MG tablet Take 650 mg by mouth every 4 (four) hours as needed for pain. Pain/headache    Historical Provider, MD  HYDROcodone-acetaminophen (NORCO/VICODIN) 5-325 MG per tablet Take one tablet by mouth three times daily for pain 03/17/15   Sharon Seller, NP   BP 161/73 mmHg  Pulse 94  Temp(Src) 98.1 F (36.7 C) (Oral)  Resp 19  Ht  (1.676 m)  Wt 161 lb (73.029 kg)  BMI 26.00 kg/m2  SpO2 96%  Vital signs normal except for hypertension  Physical Exam  Constitutional: She is oriented to person, place, and time. She appears well-developed and well-nourished.  Non-toxic appearance. She does  not appear ill. No distress.  HENT:  Head: Normocephalic and atraumatic.  Right Ear: External ear normal.  Left Ear: External ear normal.  Nose: Nose normal. No mucosal edema or rhinorrhea.  Mouth/Throat: Oropharynx is clear and moist and mucous membranes are normal. No dental abscesses or uvula swelling.  Eyes: Conjunctivae and EOM are normal. Pupils are equal, round, and reactive to light.  Neck: Normal range of motion and full passive range of motion without pain. Neck supple.  Cardiovascular: Normal rate, regular rhythm and normal heart sounds.  Exam reveals no gallop and no friction rub.   No murmur heard. Pulmonary/Chest: Effort normal and breath sounds normal. No respiratory distress. She has no wheezes. She has no rhonchi. She has no rales. She exhibits no tenderness and no crepitus.  Abdominal: Soft. Normal appearance and bowel sounds are normal. She exhibits no distension. There is no tenderness. There is no rebound and no guarding.  Non TTP  Musculoskeletal: Normal range of motion. She exhibits no edema or tenderness.  Moves all extremities well.   Neurological: She is alert and oriented to person, place, and time. She has normal strength. No cranial nerve deficit.  Skin: Skin is warm, dry and intact. No rash noted. No erythema. No pallor.  Psychiatric: She has a normal mood and affect. Her speech is normal and behavior is normal. Her mood appears not anxious.  Nursing note and vitals reviewed.   ED Course  Procedures (including critical care time)  Medications  0.9 %  sodium chloride infusion (not administered)  aztreonam (AZACTAM) 1 g in dextrose 5 % 50 mL IVPB (not administered)  sodium chloride 0.9 % bolus 700 mL (0 mLs Intravenous Stopped 11/04/15 0400)  fentaNYL (SUBLIMAZE) injection 25 mcg (25 mcg Intravenous Given 11/04/15 0141)    DIAGNOSTIC STUDIES: Oxygen Saturation is 100% on RA, normal by my interpretation.    COORDINATION OF CARE: 1:20 AM- Pt advised of  plan for treatment and pt agrees. Pt denied nausea medication but accepted pain medication. Pt will provide urine sample and receive a CT scan of her abd/pelvis  3 AM patient's labs have resulted. She started to have elevated LFTs and gallstones on her CT scan. Dedicated ultrasound of the right upper quadrant was ordered to make sure she did not have a common bile duct stone.  04:48 still having pain, now more localized in the RUQ. She states now that prior to her pain getting worse she had eating ham which she has not eaten in a long time. She states also her pain radiates into her back. We discussed being admitted to let her gallbladder "cool down". And she is agreeable.  I was attempting to order antibiotics however she has amoxicillin allergy, Avelox allergy which rules out Cipro, and when I tended to order Flagyl she has a Monistat allergy. Finally I ordered aztreonam until it can be sorted out more.   05:09 Dr Arlyss Queen Smith, admit to obs, Med-surg   Labs Review Results for orders placed or performed during the hospital encounter of 11/03/15  CBC  Result Value Ref Range   WBC 7.9 4.0 - 10.5 K/uL   RBC 4.98 3.87 - 5.11 MIL/uL   Hemoglobin 15.1 (H) 12.0 - 15.0 g/dL   HCT 45.446.1 (H) 09.836.0 - 11.946.0 %   MCV 92.6 78.0 - 100.0 fL   MCH 30.3 26.0 - 34.0 pg   MCHC 32.8 30.0 - 36.0 g/dL   RDW 14.714.2 82.911.5 - 56.215.5 %   Platelets 177 150 - 400 K/uL  Urinalysis, Routine w reflex microscopic ([facility])  Result Value Ref Range   Color, Urine AMBER (A) YELLOW   APPearance CLEAR CLEAR   Specific Gravity, Urine 1.021 1.005 - 1.030   pH 5.5 5.0 - 8.0   Glucose, UA NEGATIVE NEGATIVE mg/dL   Hgb urine dipstick NEGATIVE NEGATIVE   Bilirubin Urine NEGATIVE NEGATIVE   Ketones, ur NEGATIVE NEGATIVE mg/dL   Protein, ur NEGATIVE NEGATIVE mg/dL   Nitrite NEGATIVE NEGATIVE   Leukocytes, UA NEGATIVE NEGATIVE  Comprehensive metabolic panel  Result Value Ref Range   Sodium 137 135 - 145 mmol/L   Potassium 3.9 3.5 -  5.1 mmol/L   Chloride 101 101 - 111 mmol/L   CO2 28 22 - 32 mmol/L   Glucose, Bld 120 (H) 65 - 99 mg/dL   BUN 28 (H) 6 - 20 mg/dL   Creatinine, Ser 1.300.77 0.44 - 1.00 mg/dL   Calcium 8.9 8.9 - 86.510.3 mg/dL   Total Protein 6.5 6.5 -  8.1 g/dL   Albumin 3.4 (L) 3.5 - 5.0 g/dL   AST 213 (H) 15 - 41 U/L   ALT 433 (H) 14 - 54 U/L   Alkaline Phosphatase 177 (H) 38 - 126 U/L   Total Bilirubin 1.7 (H) 0.3 - 1.2 mg/dL   GFR calc non Af Amer >60 >60 mL/min   GFR calc Af Amer >60 >60 mL/min   Anion gap 8 5 - 15  Lipase, blood  Result Value Ref Range   Lipase 24 11 - 51 U/L   Laboratory interpretation all normal except new elevation of LFTs since July, elevated BUN consistent with dehydration   Imaging Review Ct Abdomen Pelvis Wo Contrast  11/04/2015  CLINICAL DATA:  Increasing general abdominal pain for 2 days. Nausea. Pain with urination. History of uterine cancer. EXAM: CT ABDOMEN AND PELVIS WITHOUT CONTRAST TECHNIQUE: Multidetector CT imaging of the abdomen and pelvis was performed following the standard protocol without IV contrast. COMPARISON:  08/07/2004 FINDINGS: Atelectasis in the lung bases. Bronchial wall thickening suggesting bronchitis. Coronary artery calcifications. Cholelithiasis with mild gallbladder distention. No bile duct dilatation. Multiple lesions demonstrated in the left kidney likely representing parenchymal and parapelvic cysts. No hydronephrosis or hydroureter in either kidney. No renal, ureteral, or bladder stones. Bladder is decompressed and cannot be evaluated for wall thickness. The unenhanced appearance of the liver, spleen, pancreas, adrenal glands, inferior vena cava, and retroperitoneal lymph nodes is unremarkable. Calcification of abdominal aorta without aneurysm. Stomach, small bowel, and colon are decompressed. Scattered stool in the colon. No free air or free fluid in the abdomen. Pelvis: Appendix is not identified. Uterus is surgically absent. No mass or adenopathy in  the pelvis. Diverticulosis of the sigmoid colon without diverticulitis. No free or loculated pelvic fluid collections. Degenerative changes in the spine. Compression deformities of L1 and L2. These findings are unchanged since previous MRI lumbar spine 02/04/2012. IMPRESSION: Bronchial wall thickening in the lung bases suggesting bronchitis. Cholelithiasis with mild gallbladder distention. No renal or ureteral stone or obstruction. Electronically Signed   By: Burman Nieves M.D.   On: 11/04/2015 02:13   US Abdomen Limited Ruq  11/04/2015  CLINICAL DATA:  Abdominal pain for 2 days. Elevated liver function studies. EXAM: US ABDOMEN LIMITED - RIGHT UPPER QUADRANT COMPARISON:  CT abdomen and pelvis 11/04/2015 FINDINGS: Gallbladder: Cholelithiasis with several stones in the gallbladder, largest measuring 8 mm. Gallbladder sludge. Gallbladder wall is upper limits of normal for thickness. Murphy's sign is negative. Common bile duct: Diameter: 3 mm, normal Liver: No focal lesion identified. Within normal limits in parenchymal echogenicity. IMPRESSION: Cholelithiasis with gallbladder sludge.  No bile duct dilatation. Electronically Signed   By: Burman Nieves M.D.   On: 11/04/2015 03:56     Devoria Albe, MD has personally reviewed and evaluated these images and lab results as part of her medical decision-making.   EKG Interpretation None      MDM   Final diagnoses:  Abdominal pain, unspecified abdominal location  Abnormal liver function tests  Gallstones  Nausea    Plan admission   Devoria Albe, MD, FACEP   I personally performed the services described in this documentation, which was scribed in my presence. The recorded information has been reviewed and considered.  Devoria Albe, MD, Concha Pyo, MD 11/04/15 [phone]

## 2015-11-04 NOTE — Progress Notes (Signed)
PT Cancellation Note  Patient Details Name: [name] MRN: [id number] DOB: 05/12/1911   Cancelled Treatment:    Reason Eval/Treat Not Completed: Fatigue/lethargy limiting ability to participate Pt requested to rest and did not feel able to participate at this time.  Will check back as schedule permits.   Gwyneth Fernandez,KATHrine E 11/04/2015, 11:01 AM Zenovia JarredKati San Rua, PT, DPT 11/04/2015 Pager: [phone]

## 2015-11-04 NOTE — ED Notes (Signed)
Pt in CT.

## 2015-11-05 DIAGNOSIS — E038 Other specified hypothyroidism: Secondary | ICD-10-CM | POA: Diagnosis not present

## 2015-11-05 DIAGNOSIS — K8010 Calculus of gallbladder with chronic cholecystitis without obstruction: Secondary | ICD-10-CM | POA: Diagnosis not present

## 2015-11-05 DIAGNOSIS — R1084 Generalized abdominal pain: Secondary | ICD-10-CM | POA: Diagnosis not present

## 2015-11-05 DIAGNOSIS — R7989 Other specified abnormal findings of blood chemistry: Secondary | ICD-10-CM | POA: Diagnosis not present

## 2015-11-05 LAB — HEPATIC FUNCTION PANEL
ALBUMIN: 3.2 g/dL — AB (ref 3.5–5.0)
ALT: 298 U/L — ABNORMAL HIGH (ref 14–54)
AST: 118 U/L — AB (ref 15–41)
Alkaline Phosphatase: 144 U/L — ABNORMAL HIGH (ref 38–126)
BILIRUBIN DIRECT: 0.4 mg/dL (ref 0.1–0.5)
Indirect Bilirubin: 0.7 mg/dL (ref 0.3–0.9)
Total Bilirubin: 1.1 mg/dL (ref 0.3–1.2)
Total Protein: 5.8 g/dL — ABNORMAL LOW (ref 6.5–8.1)

## 2015-11-05 NOTE — Evaluation (Signed)
Occupational Therapy Evaluation Patient Details Name: [name] MRN: [id number] DOB: 04/04/1911 Today's Date: 11/05/2015    History of Present Illness 81 year old female from [facility] admitted with cholelithiasis with mild gallbladder distention. Past medical history significant for CHF, hypertension, and arthritis.    Clinical Impression   Pt admitted with above. She demonstrates the below listed deficits and will benefit from continued OT to maximize safety and independence with BADLs.  Pt presents to OT with generalized weakness.  She currently requires min A for ADLs and functional mobility.  She would prefer to discharge back to ALF.  IF ALF is able to provide current level of assistance, then recommend discharge back to ALF with HHOT.  If they are unable to provide necessary level of care, then recommend SNF prior to return to ALF.   Did discuss with her using w/c when staff is not with her, but unsure that she fully understood this recommendation (she does report frequent falls ).      Follow Up Recommendations  Home health OT;Supervision/Assistance - 24 hour    Equipment Recommendations  None recommended by OT    Recommendations for Other Services       Precautions / Restrictions Precautions Precautions: Fall Precaution Comments: Pt reports frequent falls  Restrictions Weight Bearing Restrictions: No      Mobility Bed Mobility Overal bed mobility: Needs Assistance Bed Mobility: Supine to Sit;Sit to Supine     Supine to sit: Supervision Sit to supine: Supervision   General bed mobility comments: multiple multi-modal cues for hand placement, min assist with trunk elevation  Transfers Overall transfer level: Needs assistance Equipment used: Rolling walker (2 wheeled) Transfers: Sit to/from [employer] Sit to Stand: Min assist Stand pivot transfers: Min assist       General transfer comment: min A to steady     Balance  Overall balance assessment: Needs assistance Sitting-balance support: Feet supported Sitting balance-Leahy Scale: Good Sitting balance - Comments: able to bend forward to feet to don/doff shoes    Standing balance support: Single extremity supported;During functional activity Standing balance-Leahy Scale: Poor                              ADL Overall ADL's : Needs assistance/impaired Eating/Feeding: Independent;Sitting;Bed level   Grooming: Wash/dry hands;Wash/dry face;Oral care;Brushing hair;Minimal assistance;Standing   Upper Body Bathing: Set up;Sitting   Lower Body Bathing: Minimal assistance;Sit to/from stand   Upper Body Dressing : Set up;Sitting   Lower Body Dressing: Minimal assistance;Sit to/from stand   Toilet Transfer: Minimal assistance;Ambulation;Comfort height toilet;Regular Toilet;BSC;Grab bars;RW   Toileting- Clothing Manipulation and Hygiene: Minimal assistance;Sit to/from stand       Functional mobility during ADLs: Minimal assistance;Rolling walker General ADL Comments: Pt requires min A due to impaired balance      Vision     Perception     Praxis      Pertinent Vitals/Pain Pain Assessment: Faces Faces Pain Scale: Hurts a little bit Pain Location: generalized  Pain Descriptors / Indicators: Grimacing Pain Intervention(s): Monitored during session     Hand Dominance     Extremity/Trunk Assessment Upper Extremity Assessment Upper Extremity Assessment: Generalized weakness   Lower Extremity Assessment Lower Extremity Assessment: Defer to PT evaluation   Cervical / Trunk Assessment Cervical / Trunk Assessment: Kyphotic   Communication Communication Communication: HOH   Cognition Arousal/Alertness: Awake/alert Behavior During Therapy: WFL for tasks assessed/performed Overall Cognitive  Status: Within Functional Limits for tasks assessed                     General Comments       Exercises       Shoulder  Instructions      Home Living Family/patient expects to be discharged to:: Assisted living                             Home Equipment: Walker - 2 wheels;Walker - 4 wheels;Wheelchair - manual          Prior Functioning/Environment Level of Independence: Independent with assistive device(s)        Comments: Pt reports she falls frequently.      OT Diagnosis: Generalized weakness   OT Problem List: Decreased strength;Impaired balance (sitting and/or standing);Decreased activity tolerance   OT Treatment/Interventions: Self-care/ADL training;DME and/or AE instruction;Therapeutic activities;Patient/family education;Balance training    OT Goals(Current goals can be found in the care plan section) Acute Rehab OT Goals Patient Stated Goal: to go back to ALF  OT Goal Formulation: With patient Time For Goal Achievement: 11/19/15 Potential to Achieve Goals: Good ADL Goals Pt Will Perform Grooming: with min guard assist;standing Pt Will Perform Lower Body Bathing: with min guard assist;sit to/from stand Pt Will Perform Lower Body Dressing: with min guard assist;sit to/from stand Pt Will Transfer to Toilet: with min guard assist;ambulating;regular height toilet;bedside commode;grab bars Pt Will Perform Toileting - Clothing Manipulation and hygiene: with min guard assist;sit to/from stand  OT Frequency: Min 2X/week   Barriers to D/C: Decreased caregiver support  unsure if ALF is able to provide necessary level of care        Co-evaluation              End of Session Equipment Utilized During Treatment: Rolling walker Nurse Communication: Mobility status  Activity Tolerance: Patient tolerated treatment well Patient left: in bed;with call bell/phone within reach;with bed alarm set   Time: [id number] OT Time Calculation (min): 30 min Charges:  OT General Charges $OT Visit: 1 Procedure OT Evaluation $Initial OT Evaluation Tier I: 1 Procedure OT Treatments $Self  Care/Home Management : 8-22 mins G-Codes: OT G-codes **NOT FOR INPATIENT CLASS** Functional Limitation: Self care Self Care Current Status ([id number]): At least 20 percent but less than 40 percent impaired, limited or restricted Self Care Goal Status (Q4696(G8988): At least 1 percent but less than 20 percent impaired, limited or restricted  Rigoberto Repass M 11/05/2015, 2:38 PM

## 2015-11-05 NOTE — Care Management Note (Signed)
Case Management Note  Patient Details  Name: [name] MRN: [id number] Date of Birth: [birth year]  Subjective/Objective:  PT-recc HHPT. Await HHPT, [id number] order. Will fax to [facility] once received-fax#[phone]                  Action/Plan:d/c plan return ALF.   Expected Discharge Date:                  Expected Discharge Plan:  Home w Home Health Services  In-House Referral:  Clinical Social Work  Discharge planning Services  CM Consult  Post Acute Care Choice:    Choice offered to:     DME Arranged:    DME Agency:     HH Arranged:    HH Agency:     Status of Service:  In process, will continue to follow  Medicare Important Message Given:    Date Medicare IM Given:    Medicare IM give by:    Date Additional Medicare IM Given:    Additional Medicare Important Message give by:     If discussed at Long Length of Stay Meetings, dates discussed:    Additional Comments:  Lanier ClamMahabir, Kirin Pastorino, RN 11/05/2015, 1:43 PM

## 2015-11-05 NOTE — Care Management Note (Signed)
Case Management Note  Patient Details  Name: [name] MRN: [id number] Date of Birth: [birth year]  Subjective/Objective: Ordered for HHRN/PT/OT/aide-provided patient w/HHC agency list since [facility] can only provide HHPT/HHOT. Will await choice-[facility].Still awaiting from CSW to confirm if able to return back to ALF level.                   Action/Plan:   Expected Discharge Date:                  Expected Discharge Plan:  Home w Home Health Services  In-House Referral:  Clinical Social Work  Discharge planning Services  CM Consult  Post Acute Care Choice:    Choice offered to:     DME Arranged:    DME Agency:     HH Arranged:    HH Agency:     Status of Service:  In process, will continue to follow  Medicare Important Message Given:    Date Medicare IM Given:    Medicare IM give by:    Date Additional Medicare IM Given:    Additional Medicare Important Message give by:     If discussed at Long Length of Stay Meetings, dates discussed:    Additional Comments:  Lanier ClamMahabir, Derra Shartzer, RN 11/05/2015, 2:46 PM

## 2015-11-05 NOTE — Care Management Note (Signed)
Case Management Note  Patient Details  Name: [name] MRN: [id number] Date of Birth: [birth year]  Subjective/Objective:  Noted PT concerns if patient able to return back to ALF level-I have notified CSW-will await if facility can accept back prior to faxiing HHPT/HHOT orders.MD/CSW updated.                  Action/Plan:   Expected Discharge Date:                  Expected Discharge Plan:  Home w Home Health Services  In-House Referral:  Clinical Social Work  Discharge planning Services  CM Consult  Post Acute Care Choice:    Choice offered to:     DME Arranged:    DME Agency:     HH Arranged:    HH Agency:     Status of Service:  In process, will continue to follow  Medicare Important Message Given:    Date Medicare IM Given:    Medicare IM give by:    Date Additional Medicare IM Given:    Additional Medicare Important Message give by:     If discussed at Long Length of Stay Meetings, dates discussed:    Additional Comments:  Lanier ClamMahabir, Armentha Branagan, RN 11/05/2015, 2:34 PM

## 2015-11-05 NOTE — [place] FL2 (Signed)
[place] MEDICAID FL2 LEVEL OF CARE SCREENING TOOL     IDENTIFICATION  Patient Name: [name] Birthdate: 1911-02-07 Sex: female Admission Date (Current Location): 11/03/2015  [facility] and [place] Number: Producer, television/film/video and Address:  [facility]. [facility], [address], [place], [place] [address]      Provider Number: [id number]  Attending Physician Name and Address:  Alison Murray, MD  Relative Name and Phone Number:       Current Level of Care: Hospital Recommended Level of Care: Skilled Nursing Facility Prior Approval Number:    Date Approved/Denied:   PASRR Number:    Discharge Plan: Other (Comment) (ALF)    Current Diagnoses: Patient Active Problem List   Diagnosis Date Noted  . Nausea 11/04/2015  . Cholelithiasis 11/04/2015  . Generalized abdominal pain 11/04/2015  . Abnormal liver function tests   . Acute bronchitis 05/29/2015  . Gait instability 09/26/2014  . Arthralgia 09/26/2014  . Stasis dermatitis of both legs 09/26/2014  . Hearing loss 09/26/2014  . Urinary frequency 02/24/2014  . Edema 08/09/2013  . Corn of toe 08/09/2013  . Constipation 05/03/2013  . Seasonal allergies 05/03/2013  . Neuropathic pain of both legs 05/03/2013  . Cramping of hands 05/03/2013  . COPD, mild (HCC) 01/20/2013  . HTN (hypertension) 01/20/2013  . Hypothyroidism 01/20/2013  . Rheumatic fever 09/26/1933    Orientation ACTIVITIES/SOCIAL BLADDER RESPIRATION    Self, Time, Situation, Place    Continent Normal  BEHAVIORAL SYMPTOMS/MOOD NEUROLOGICAL BOWEL NUTRITION STATUS      Continent    PHYSICIAN VISITS COMMUNICATION OF NEEDS Height & Weight Skin    Verbally 5' (152.4 cm) 131 lbs. Normal          AMBULATORY STATUS RESPIRATION      Normal      Personal Care Assistance Level of Assistance               Functional Limitations Info                SPECIAL CARE FACTORS FREQUENCY  PT (By licensed PT), OT (By licensed OT)      PT Frequency: daily OT Frequency: daily           Additional Factors Info  Allergies, Code Status Code Status Info: FULL Allergies Info: Amoxicillin, Avelox, Erthryomycin, Monistat, Morphone and related, Orange juice           Current Medications (11/05/2015): Current Facility-Administered Medications  Medication Dose Route Frequency Provider Last Rate Last Dose  . 0.9 %  sodium chloride infusion   Intravenous Continuous Alison Murray, MD 50 mL/hr at 11/05/15 1324    . acetaminophen (TYLENOL) tablet 650 mg  650 mg Oral Q4H PRN Clydie Braun, MD      . alum & mag hydroxide-simeth (MAALOX/MYLANTA) 200-200-20 MG/5ML suspension 30 mL  30 mL Oral Q6H PRN Clydie Braun, MD      . aspirin chewable tablet 81 mg  81 mg Oral Daily Clydie Braun, MD   81 mg at 11/05/15 1122  . aztreonam (AZACTAM) 1 g in dextrose 5 % 50 mL IVPB  1 g Intravenous 3 times per day Devoria Albe, MD   1 g at 11/05/15 0547  . B-complex with vitamin C tablet 1 tablet  1 tablet Oral Daily Clydie Braun, MD   1 tablet at 11/05/15 1122  . calcium-vitamin D (OSCAL WITH D) 500-200 MG-UNIT per tablet 1 tablet  1 tablet Oral  Daily Clydie Braunondell A Smith, MD   1 tablet at 11/05/15 1122  . docusate sodium (COLACE) capsule 200 mg  200 mg Oral Daily Clydie Braunondell A Smith, MD   200 mg at 11/05/15 1122  . enoxaparin (LOVENOX) injection 40 mg  40 mg Subcutaneous Q24H Clydie Braunondell A Smith, MD   40 mg at 11/05/15 1123  . [START ON 11/06/2015] fentaNYL (DURAGESIC - dosed mcg/hr) 12.5 mcg  12.5 mcg Transdermal Q72H Rondell A Smith, MD      . furosemide (LASIX) tablet 20 mg  20 mg Oral Daily Rondell A Katrinka BlazingSmith, MD   20 mg at 11/05/15 1122  . hydrALAZINE (APRESOLINE) injection 5 mg  5 mg Intravenous Q6H PRN Alison MurrayAlma M Devine, MD      . HYDROcodone-acetaminophen (NORCO/VICODIN) 5-325 MG per tablet 1 tablet  1 tablet Oral Q8H PRN Clydie Braunondell A Smith, MD   1 tablet at 11/04/15 2108  . levothyroxine (SYNTHROID, LEVOTHROID) tablet 150 mcg  150 mcg Oral QAC  breakfast Clydie Braunondell A Smith, MD   150 mcg at 11/05/15 0834  . loratadine (CLARITIN) tablet 10 mg  10 mg Oral Daily Clydie Braunondell A Smith, MD   10 mg at 11/05/15 1122  . magnesium hydroxide (MILK OF MAGNESIA) suspension 30 mL  30 mL Oral Daily PRN Clydie Braunondell A Smith, MD      . potassium chloride (K-DUR,KLOR-CON) CR tablet 10 mEq  10 mEq Oral Daily Clydie Braunondell A Smith, MD   10 mEq at 11/05/15 1122   Do not use this list as official medication orders. Please verify with discharge summary.  Discharge Medications:   Medication List    ASK your doctor about these medications        acetaminophen 325 MG tablet  Commonly known as:  TYLENOL  Take 650 mg by mouth every 4 (four) hours as needed for pain. Pain/headache     alum & mag hydroxide-simeth 200-200-20 MG/5ML suspension  Commonly known as:  MAALOX/MYLANTA  Take 30 mLs by mouth every 6 (six) hours as needed for indigestion or heartburn.     aspirin 81 MG chewable tablet  Chew 81 mg by mouth daily.     B-complex with vitamin C tablet  Take 1 tablet by mouth daily.     calcium citrate-vitamin D 315-200 MG-UNIT tablet  Commonly known as:  CITRACAL+D  Take 1 tablet by mouth daily.     docusate sodium 100 MG capsule  Commonly known as:  COLACE  Take 200 mg by mouth daily.     EQL CENTURY MATURE Tabs  Take 1 tablet by mouth daily.     fentaNYL 25 MCG/HR patch  Commonly known as:  DURAGESIC - dosed mcg/hr  Apply one patch topically every 3 days. Remove old patch before applying new patch     fexofenadine 60 MG tablet  Commonly known as:  ALLEGRA  Take 60 mg by mouth daily.     furosemide 20 MG tablet  Commonly known as:  LASIX  Take 1 tablet by mouth daily.     HYDROcodone-acetaminophen 5-325 MG tablet  Commonly known as:  NORCO/VICODIN  Take one tablet by mouth three times daily for pain     levothyroxine 150 MCG tablet  Commonly known as:  SYNTHROID, LEVOTHROID  Take 1 tablet by mouth daily.     magnesium hydroxide 400 MG/5ML  suspension  Commonly known as:  MILK OF MAGNESIA  Take 30 mLs by mouth daily as needed for mild constipation.     potassium chloride 10  MEQ tablet  Commonly known as:  K-DUR,KLOR-CON  Take 1 tablet by mouth daily.        Relevant Imaging Results:  Relevant Lab Results:  Recent Labs    Additional Information    Rondel Baton, LCSW

## 2015-11-05 NOTE — Progress Notes (Signed)
Patient ID: [name], female   DOB: Sep 08, 1911, [age] y.o.   MRN: [id number] [facility] PROGRESS NOTE  [name] WUJ:[id number] DOB: Sep 08, 1911 DOA: 11/03/2015 PCP: Kimber RelicGREEN, ARTHUR G, MD  Brief narrative:    81 year old female with past medical history of dementia, hypothyroidism [facility]  abdominal pain for past 2 days prior to this admission. Patient reported constant, dull generalized abdominal pain worse after eating. She had one episode of nonbloody vomiting. No fevers or chills. Abdominal ultrasound showed cholelithiasis with gallbladder sludge but no bile duct dilatation. CT abdomen confirmed cholelithiasis with mild gallbladder distention. Blood work revealed AST 171, ALT 433, ALP 177 and total bilirubin 1.7.  Anticipated discharge: if tolerates solids may be discharged 11/24.  Assessment/Plan:    Principal Problem: Cholelithiasis / possible acute cholecystitis / abnormal liver function enzymes - Abdominal ultrasound and CT abdomen showed cholelithiasis with gallbladder sludge but no bile duct dilatation. Doubt that this is acute cholecystitis but she was started on empiric aztreonam. - Elevated liver enzymes likely from stone that has passed - AST 171, ALT 433, ALP 177 and total bilirubin 1.7. LFT's trending down - Check LFT's tomorrow  - Diet advanced to regular  Active problems: Hypothyroidism - Continue synthroid   DVT Prophylaxis  - Lovenox subQ ordered    Code Status: Full.  Family Communication:  plan of care discussed with the patient; family not at the bedside this am Disposition Plan: to SNF likely tomorrow 11/24.  IV access:  Peripheral IV  Procedures and diagnostic studies:    Ct Abdomen Pelvis Wo Contrast 11/04/2015   Bronchial wall thickening in the lung bases suggesting bronchitis. Cholelithiasis with mild gallbladder distention. No renal or ureteral stone or obstruction. Electronically Signed   By: Burman NievesWilliam  Stevens  M.D.   On: 11/04/2015 02:13   [place] Abdomen Limited Ruq 11/04/2015 Cholelithiasis with gallbladder sludge.  No bile duct dilatation. Electronically Signed   By: Burman NievesWilliam  Stevens M.D.   On: 11/04/2015 03:56   Medical Consultants:  None   Other Consultants:  PT  IAnti-Infectives:   Aztreonam 11/04/2015 -->  Manson PasseyEVINE, Runa Whittingham, MD  [facility] Pager [phone]  Time spent in minutes: 15 minutes  If 7PM-7AM, please contact night-coverage [email or web address] Password [email or web address] 11/05/2015, 3:00 PM      HPI/Subjective: No acute overnight events. Pain somewhat better this am.  Objective: Filed Vitals:   11/04/15 2118 11/05/15 0128 11/05/15 0437 11/05/15 1322  BP: 183/75 150/68 144/66 116/78  Pulse: 91 86 81 89  Temp: 97.7 F (36.5 C)  97.8 F (36.6 C) 97.4 F (36.3 C)  TempSrc: Axillary  Oral Oral  Resp: 18  20 18   Height:      Weight:      SpO2: 99%  97% 99%    Intake/Output Summary (Last 24 hours) at 11/05/15 1500 Last data filed at 11/05/15 1357  Gross per 24 hour  Intake    810 ml  Output    900 ml  Net    -90 ml    Exam:   General:  Pt is not in acute distress  Cardiovascular: RRR, S1/S2 (+)  Respiratory: No wheezing, no crackles, no rhonchi  Abdomen: Soft, non tender, non distended, bowel sounds present  Extremities: No edema, pulses palpable bilaterally  Neuro: Grossly nonfocal  Data Reviewed: Basic Metabolic Panel:  Recent Labs Lab 11/04/15 0140  NA 137  K 3.9  CL 101  CO2 28  GLUCOSE 120*  BUN  28*  CREATININE 0.77  CALCIUM 8.9   Liver Function Tests:  Recent Labs Lab 11/04/15 0140 11/05/15 0530  AST 171* 118*  ALT 433* 298*  ALKPHOS 177* 144*  BILITOT 1.7* 1.1  PROT 6.5 5.8*  ALBUMIN 3.4* 3.2*    Recent Labs Lab 11/04/15 0140  LIPASE 24   No results for input(s): AMMONIA in the last 168 hours. CBC:  Recent Labs Lab 11/04/15 0015  WBC 7.9  HGB 15.1*  HCT 46.1*  MCV 92.6  PLT 177   Cardiac Enzymes: No results for  input(s): CKTOTAL, CKMB, CKMBINDEX, TROPONINI in the last 168 hours. BNP: Invalid input(s): POCBNP CBG: No results for input(s): GLUCAP in the last 168 hours.  Recent Results (from the past 240 hour(s))  MRSA PCR Screening     Status: None   Collection Time: 11/04/15  7:14 AM  Result Value Ref Range Status   MRSA by PCR NEGATIVE NEGATIVE Final     Scheduled Meds: . aspirin  81 mg Oral Daily  . aztreonam  1 g Intravenous 3 times per day  . B-complex with vitamin C  1 tablet Oral Daily  . calcium-vitamin D  1 tablet Oral Daily  . docusate sodium  200 mg Oral Daily  . enoxaparin (LOVENOX) injection  40 mg Subcutaneous Q24H  . [START ON 11/06/2015] fentaNYL  12.5 mcg Transdermal Q72H  . furosemide  20 mg Oral Daily  . levothyroxine  150 mcg Oral QAC breakfast  . loratadine  10 mg Oral Daily  . potassium chloride  10 mEq Oral Daily   Continuous Infusions: . sodium chloride 50 mL/hr at 11/05/15 8119

## 2015-11-05 NOTE — Evaluation (Signed)
Physical Therapy Evaluation Patient Details Name: [name] MRN: [id number] DOB: [birth year] Today's Date: 11/05/2015   History of Present Illness  81 year old female from [facility] admitted with cholelithiasis with mild gallbladder distention. Past medical history significant for CHF, hypertension, and arthritis.   Clinical Impression  Pt admitted with above diagnosis. Pt currently with functional limitations due to the deficits listed below (see PT Problem List). Pt will benefit from skilled PT to increase their independence and safety with mobility to allow discharge to the venue listed below. Recommend HHPT if ALF can provide current level of assist, if not, would benefit from SNF.         Follow Up Recommendations Home health PT    Equipment Recommendations  None recommended by PT    Recommendations for Other Services       Precautions / Restrictions Precautions Precautions: Fall Restrictions Weight Bearing Restrictions: No      Mobility  Bed Mobility Overal bed mobility: Needs Assistance Bed Mobility: Supine to Sit     Supine to sit: Min assist     General bed mobility comments: multiple multi-modal cues for hand placement, min assist with trunk elevation  Transfers Overall transfer level: Needs assistance Equipment used: Rolling walker (2 wheeled) Transfers: Sit to/from Stand Sit to Stand: Mod assist         General transfer comment: mod assist with rise and descend, cues for correct technique; transferred to/from [facility] with mod assist from PT  Ambulation/Gait Ambulation/Gait assistance: Min assist Ambulation Distance (Feet): 120 Feet Assistive device: Rolling walker (2 wheeled) Gait Pattern/deviations: Step-through pattern;Decreased stride length Gait velocity: decreased    General Gait Details: multiple verbal cues for staying close to RW and maintaining good posture, min assist during turns for RW positioning   Stairs             Wheelchair Mobility    Modified Rankin (Stroke Patients Only)       Balance Overall balance assessment: Needs assistance   Sitting balance-Leahy Scale: Fair     Standing balance support: Bilateral upper extremity supported;During functional activity Standing balance-Leahy Scale: Poor                               Pertinent Vitals/Pain Pain Assessment: No/denies pain    Home Living Family/patient expects to be discharged to:: Assisted living               Home Equipment: Walker - 4 wheels      Prior Function Level of Independence: Independent with assistive device(s)         Comments: Pt states she uses rollator to go to dining services     Hand Dominance        Extremity/Trunk Assessment   Upper Extremity Assessment: Generalized weakness           Lower Extremity Assessment: Generalized weakness      Cervical / Trunk Assessment: Normal  Communication   Communication: HOH  Cognition Arousal/Alertness: Awake/alert Behavior During Therapy: WFL for tasks assessed/performed Overall Cognitive Status: Within Functional Limits for tasks assessed                      General Comments      Exercises        Assessment/Plan    PT Assessment Patient needs continued PT services  PT Diagnosis Difficulty walking;Generalized weakness   PT Problem List  Decreased strength;Decreased activity tolerance;Decreased balance;Decreased mobility;Decreased knowledge of use of DME  PT Treatment Interventions DME instruction;Gait training;Functional mobility training;Therapeutic activities;Therapeutic exercise;Balance training;Patient/family education   PT Goals (Current goals can be found in the Care Plan section) Acute Rehab PT Goals Patient Stated Goal: to feel better PT Goal Formulation: With patient Time For Goal Achievement: 11/14/15 Potential to Achieve Goals: Good    Frequency Min 3X/week   Barriers to discharge         Co-evaluation               End of Session Equipment Utilized During Treatment: Gait belt Activity Tolerance: Patient tolerated treatment well;No increased pain Patient left: in chair;with call bell/phone within reach;with chair alarm set Nurse Communication: Mobility status    Functional Assessment Tool Used: clinical judgement Mobility: Walking and Moving Around Current Status ([id number](G8978): At least 40 percent but less than 60 percent impaired, limited or restricted Mobility: Walking and Moving Around Goal Status [phone]([id number]): At least 1 percent but less than 20 percent impaired, limited or restricted    Time: 1030-1051 PT Time Calculation (min) (ACUTE ONLY): 21 min   Charges:   PT Evaluation $Initial PT Evaluation Tier I: 1 Procedure     PT G Codes:   PT G-Codes **NOT FOR INPATIENT CLASS** Functional Assessment Tool Used: clinical judgement Mobility: Walking and Moving Around Current Status ([id number](G8978): At least 40 percent but less than 60 percent impaired, limited or restricted Mobility: Walking and Moving Around Goal Status [phone](G8979): At least 1 percent but less than 20 percent impaired, limited or restricted    [employer], SPT 11/05/2015, 1:52 PM

## 2015-11-06 DIAGNOSIS — R29898 Other symptoms and signs involving the musculoskeletal system: Secondary | ICD-10-CM | POA: Diagnosis not present

## 2015-11-06 DIAGNOSIS — R748 Abnormal levels of other serum enzymes: Secondary | ICD-10-CM | POA: Diagnosis not present

## 2015-11-06 DIAGNOSIS — R1084 Generalized abdominal pain: Secondary | ICD-10-CM | POA: Diagnosis not present

## 2015-11-06 DIAGNOSIS — R7989 Other specified abnormal findings of blood chemistry: Secondary | ICD-10-CM | POA: Diagnosis not present

## 2015-11-06 DIAGNOSIS — R109 Unspecified abdominal pain: Secondary | ICD-10-CM | POA: Diagnosis not present

## 2015-11-06 DIAGNOSIS — I509 Heart failure, unspecified: Secondary | ICD-10-CM | POA: Diagnosis not present

## 2015-11-06 DIAGNOSIS — Z9181 History of falling: Secondary | ICD-10-CM | POA: Diagnosis not present

## 2015-11-06 DIAGNOSIS — J449 Chronic obstructive pulmonary disease, unspecified: Secondary | ICD-10-CM | POA: Diagnosis not present

## 2015-11-06 DIAGNOSIS — R1313 Dysphagia, pharyngeal phase: Secondary | ICD-10-CM | POA: Diagnosis not present

## 2015-11-06 DIAGNOSIS — G5791 Unspecified mononeuropathy of right lower limb: Secondary | ICD-10-CM | POA: Diagnosis not present

## 2015-11-06 DIAGNOSIS — K8010 Calculus of gallbladder with chronic cholecystitis without obstruction: Secondary | ICD-10-CM | POA: Diagnosis not present

## 2015-11-06 DIAGNOSIS — I110 Hypertensive heart disease with heart failure: Secondary | ICD-10-CM | POA: Diagnosis not present

## 2015-11-06 DIAGNOSIS — G5792 Unspecified mononeuropathy of left lower limb: Secondary | ICD-10-CM | POA: Diagnosis not present

## 2015-11-06 DIAGNOSIS — R112 Nausea with vomiting, unspecified: Secondary | ICD-10-CM | POA: Diagnosis not present

## 2015-11-06 DIAGNOSIS — R609 Edema, unspecified: Secondary | ICD-10-CM | POA: Diagnosis not present

## 2015-11-06 DIAGNOSIS — K5900 Constipation, unspecified: Secondary | ICD-10-CM | POA: Diagnosis not present

## 2015-11-06 DIAGNOSIS — R739 Hyperglycemia, unspecified: Secondary | ICD-10-CM | POA: Diagnosis not present

## 2015-11-06 DIAGNOSIS — E039 Hypothyroidism, unspecified: Secondary | ICD-10-CM | POA: Diagnosis not present

## 2015-11-06 DIAGNOSIS — M2550 Pain in unspecified joint: Secondary | ICD-10-CM | POA: Diagnosis not present

## 2015-11-06 DIAGNOSIS — E038 Other specified hypothyroidism: Secondary | ICD-10-CM | POA: Diagnosis not present

## 2015-11-06 DIAGNOSIS — J302 Other seasonal allergic rhinitis: Secondary | ICD-10-CM | POA: Diagnosis not present

## 2015-11-06 DIAGNOSIS — K8081 Other cholelithiasis with obstruction: Secondary | ICD-10-CM | POA: Diagnosis not present

## 2015-11-06 DIAGNOSIS — R2681 Unsteadiness on feet: Secondary | ICD-10-CM | POA: Diagnosis not present

## 2015-11-06 DIAGNOSIS — M6281 Muscle weakness (generalized): Secondary | ICD-10-CM | POA: Diagnosis not present

## 2015-11-06 DIAGNOSIS — K8020 Calculus of gallbladder without cholecystitis without obstruction: Secondary | ICD-10-CM | POA: Diagnosis not present

## 2015-11-06 DIAGNOSIS — I10 Essential (primary) hypertension: Secondary | ICD-10-CM | POA: Diagnosis not present

## 2015-11-06 DIAGNOSIS — E785 Hyperlipidemia, unspecified: Secondary | ICD-10-CM | POA: Diagnosis not present

## 2015-11-06 LAB — COMPREHENSIVE METABOLIC PANEL
ALBUMIN: 3.1 g/dL — AB (ref 3.5–5.0)
ALK PHOS: 126 U/L (ref 38–126)
ALT: 230 U/L — AB (ref 14–54)
AST: 89 U/L — AB (ref 15–41)
Anion gap: 8 (ref 5–15)
BILIRUBIN TOTAL: 1.3 mg/dL — AB (ref 0.3–1.2)
BUN: 17 mg/dL (ref 6–20)
CALCIUM: 8.8 mg/dL — AB (ref 8.9–10.3)
CO2: 26 mmol/L (ref 22–32)
CREATININE: 0.72 mg/dL (ref 0.44–1.00)
Chloride: 106 mmol/L (ref 101–111)
GFR calc Af Amer: >60 mL/min (ref >60)
GLUCOSE: 81 mg/dL (ref 65–99)
Potassium: 3.7 mmol/L (ref 3.5–5.1)
Sodium: 140 mmol/L (ref 135–145)
TOTAL PROTEIN: 5.9 g/dL — AB (ref 6.5–8.1)

## 2015-11-06 MED ORDER — FENTANYL 25 MCG/HR TD PT72: MEDICATED_PATCH | TRANSDERMAL | Status: DC

## 2015-11-06 MED ORDER — ENOXAPARIN SODIUM 30 MG/0.3ML ~~LOC~~ SOLN
30.0000 mg | SUBCUTANEOUS | Status: DC
  Administered 2015-11-06: 30 mg via SUBCUTANEOUS
  Filled 2015-11-06: qty 0.3

## 2015-11-06 NOTE — Progress Notes (Signed)
[facility]. Son, Sam and patient agreeable to this plan- plan transfer via EMS. Reece LevyJanet Analaura Messler, MSW, Theresia MajorsLCSWA [phone]

## 2015-11-06 NOTE — Discharge Summary (Addendum)
Physician Discharge Summary  [name]:[id number] DOB: [birth year] DOA: 11/03/2015  PCP: Kimber Relic, MD  Admit date: 11/03/2015 Discharge date: 11/06/2015  Recommendations for Outpatient Follow-up:  Continue to check liver enzymes at least in next 3 days to make sure stable. Please have palliative care service see the patient and address goals of care in SNF.  Discharge Diagnoses:  Principal Problem:   Cholelithiasis Active Problems:   COPD, mild (HCC)   HTN (hypertension)   Hypothyroidism   Nausea   Generalized abdominal pain   Abnormal liver function tests    Discharge Condition: stable   Diet recommendation: as tolerated   History of present illness:  81 year old female with past medical history of dementia, hypothyroidism who presented to [facility] ED with abdominal pain for past 2 days prior to this admission. Patient reported constant, dull generalized abdominal pain worse after eating. She had one episode of nonbloody vomiting. No fevers or chills. Abdominal ultrasound showed cholelithiasis with gallbladder sludge but no bile duct dilatation. CT abdomen confirmed cholelithiasis with mild gallbladder distention. Blood work revealed AST 171, ALT 433, ALP 177 and total bilirubin 1.7.  Hospital Course:  Assessment/Plan:    Principal Problem: Cholelithiasis / possible acute cholecystitis / abnormal liver function enzymes / Generalized abdominal pain - Please note that the abdominal ultrasound and CT abdomen showed cholelithiasis with gallbladder sludge but no bile duct dilatation. Doubt that this is acute cholecystitis but she was started on empiric aztreonam. - Elevated liver enzymes likely may have passed the stone  - LFT's are trending down - Has tolerated regular diet although has not eaten a lot - Continue fentanyl patch on discharge as per prior to this admission   Active problems: Hypothyroidism - Continue synthroid on discahrge  DVT Prophylaxis   - Lovenox subQ ordered in hospital    Code Status: Full.  Family Communication: plan of care discussed with the patient and her son at the bedside   IV access:  Peripheral IV  Procedures and diagnostic studies:   Ct Abdomen Pelvis Wo Contrast 11/04/2015 Bronchial wall thickening in the lung bases suggesting bronchitis. Cholelithiasis with mild gallbladder distention. No renal or ureteral stone or obstruction. Electronically Signed By: Burman Nieves M.D. On: 11/04/2015 02:13   US Abdomen Limited Ruq 11/04/2015 Cholelithiasis with gallbladder sludge. No bile duct dilatation. Electronically Signed By: Burman Nieves M.D. On: 11/04/2015 03:56   Medical Consultants:  None   Other Consultants:  PT  IAnti-Infectives:   Aztreonam 11/04/2015 --> 11/06/2015     Signed:  Manson Passey, MD  [facility] 11/06/2015, 9:01 AM  Pager #: [phone]  Time spent in minutes: more than 30 minutes   Discharge Exam: Filed Vitals:   11/05/15 2047 11/06/15 0603  BP: 155/79 137/64  Pulse: 98 93  Temp: 98 F (36.7 C) 97.9 F (36.6 C)  Resp: 18 18   Filed Vitals:   11/05/15 0437 11/05/15 1322 11/05/15 2047 11/06/15 0603  BP: 144/66 116/78 155/79 137/64  Pulse: 81 89 98 93  Temp: 97.8 F (36.6 C) 97.4 F (36.3 C) 98 F (36.7 C) 97.9 F (36.6 C)  TempSrc: Oral Oral Oral Oral  Resp: 20 18 18 18   Height:      Weight:      SpO2: 97% 99% 95% 92%    General: Pt is alert, not in acute distress Cardiovascular: Regular rate and rhythm, S1/S2 + Respiratory: Clear to auscultation bilaterally, no wheezing, no crackles, no rhonchi Abdominal: Soft, non tender,  non distended, bowel sounds +, no guarding Extremities: no edema, no cyanosis, pulses palpable bilaterally DP and PT Neuro: Grossly nonfocal  Discharge Instructions  Discharge Instructions    Call MD for:  difficulty breathing, headache or visual disturbances    Complete by:  As directed       Call MD for:  persistant nausea and vomiting    Complete by:  As directed      Call MD for:  severe uncontrolled pain    Complete by:  As directed      Diet - low sodium heart healthy    Complete by:  As directed      Discharge instructions    Complete by:  As directed   Continue to check liver enzymes at least in next 3 days to make sure stable. Please have palliative care service see the patient and address goals of care in SNF.     Increase activity slowly    Complete by:  As directed             Medication List    STOP taking these medications        HYDROcodone-acetaminophen 5-325 MG tablet  Commonly known as:  NORCO/VICODIN      TAKE these medications        acetaminophen 325 MG tablet  Commonly known as:  TYLENOL  Take 650 mg by mouth every 4 (four) hours as needed for pain. Pain/headache     alum & mag hydroxide-simeth 200-200-20 MG/5ML suspension  Commonly known as:  MAALOX/MYLANTA  Take 30 mLs by mouth every 6 (six) hours as needed for indigestion or heartburn.     aspirin 81 MG chewable tablet  Chew 81 mg by mouth daily.     B-complex with vitamin C tablet  Take 1 tablet by mouth daily.     calcium citrate-vitamin D 315-200 MG-UNIT tablet  Commonly known as:  CITRACAL+D  Take 1 tablet by mouth daily.     docusate sodium 100 MG capsule  Commonly known as:  COLACE  Take 200 mg by mouth daily.     EQL CENTURY MATURE Tabs  Take 1 tablet by mouth daily.     fentaNYL 25 MCG/HR patch  Commonly known as:  DURAGESIC - dosed mcg/hr  Apply one patch topically every 3 days. Remove old patch before applying new patch     fexofenadine 60 MG tablet  Commonly known as:  ALLEGRA  Take 60 mg by mouth daily.     furosemide 20 MG tablet  Commonly known as:  LASIX  Take 1 tablet by mouth daily.     levothyroxine 150 MCG tablet  Commonly known as:  SYNTHROID, LEVOTHROID  Take 1 tablet by mouth daily.     magnesium hydroxide 400 MG/5ML suspension   Commonly known as:  MILK OF MAGNESIA  Take 30 mLs by mouth daily as needed for mild constipation.     potassium chloride 10 MEQ tablet  Commonly known as:  K-DUR,KLOR-CON  Take 1 tablet by mouth daily.           Follow-up Information    Follow up with GREEN, Lenon CurtARTHUR G, MD. Schedule an appointment as soon as possible for a visit in 2 weeks.   Specialty:  Internal Medicine   Why:  Follow up appt after recent hospitalization   Contact information:   [address] [place] [address] [phone]        The results of significant diagnostics from this  hospitalization (including imaging, microbiology, ancillary and laboratory) are listed below for reference.    Significant Diagnostic Studies: Ct Abdomen Pelvis Wo Contrast  11/04/2015  CLINICAL DATA:  Increasing general abdominal pain for 2 days. Nausea. Pain with urination. History of uterine cancer. EXAM: CT ABDOMEN AND PELVIS WITHOUT CONTRAST TECHNIQUE: Multidetector CT imaging of the abdomen and pelvis was performed following the standard protocol without IV contrast. COMPARISON:  08/07/2004 FINDINGS: Atelectasis in the lung bases. Bronchial wall thickening suggesting bronchitis. Coronary artery calcifications. Cholelithiasis with mild gallbladder distention. No bile duct dilatation. Multiple lesions demonstrated in the left kidney likely representing parenchymal and parapelvic cysts. No hydronephrosis or hydroureter in either kidney. No renal, ureteral, or bladder stones. Bladder is decompressed and cannot be evaluated for wall thickness. The unenhanced appearance of the liver, spleen, pancreas, adrenal glands, inferior vena cava, and retroperitoneal lymph nodes is unremarkable. Calcification of abdominal aorta without aneurysm. Stomach, small bowel, and colon are decompressed. Scattered stool in the colon. No free air or free fluid in the abdomen. Pelvis: Appendix is not identified. Uterus is surgically absent. No mass or adenopathy in  the pelvis. Diverticulosis of the sigmoid colon without diverticulitis. No free or loculated pelvic fluid collections. Degenerative changes in the spine. Compression deformities of L1 and L2. These findings are unchanged since previous MRI lumbar spine 02/04/2012. IMPRESSION: Bronchial wall thickening in the lung bases suggesting bronchitis. Cholelithiasis with mild gallbladder distention. No renal or ureteral stone or obstruction. Electronically Signed   By: Burman Nieves M.D.   On: 11/04/2015 02:13   US Abdomen Limited Ruq  11/04/2015  CLINICAL DATA:  Abdominal pain for 2 days. Elevated liver function studies. EXAM: US ABDOMEN LIMITED - RIGHT UPPER QUADRANT COMPARISON:  CT abdomen and pelvis 11/04/2015 FINDINGS: Gallbladder: Cholelithiasis with several stones in the gallbladder, largest measuring 8 mm. Gallbladder sludge. Gallbladder wall is upper limits of normal for thickness. Murphy's sign is negative. Common bile duct: Diameter: 3 mm, normal Liver: No focal lesion identified. Within normal limits in parenchymal echogenicity. IMPRESSION: Cholelithiasis with gallbladder sludge.  No bile duct dilatation. Electronically Signed   By: Burman Nieves M.D.   On: 11/04/2015 03:56    Microbiology: Recent Results (from the past 240 hour(s))  MRSA PCR Screening     Status: None   Collection Time: 11/04/15  7:14 AM  Result Value Ref Range Status   MRSA by PCR NEGATIVE NEGATIVE Final    Comment:        The GeneXpert MRSA Assay (FDA approved for NASAL specimens only), is one component of a comprehensive MRSA colonization surveillance program. It is not intended to diagnose MRSA infection nor to guide or monitor treatment for MRSA infections.      Labs: Basic Metabolic Panel:  Recent Labs Lab 11/04/15 0140 11/06/15 0545  NA 137 140  K 3.9 3.7  CL 101 106  CO2 28 26  GLUCOSE 120* 81  BUN 28* 17  CREATININE 0.77 0.72  CALCIUM 8.9 8.8*   Liver Function Tests:  Recent Labs Lab  11/04/15 0140 11/05/15 0530 11/06/15 0545  AST 171* 118* 89*  ALT 433* 298* 230*  ALKPHOS 177* 144* 126  BILITOT 1.7* 1.1 1.3*  PROT 6.5 5.8* 5.9*  ALBUMIN 3.4* 3.2* 3.1*    Recent Labs Lab 11/04/15 0140  LIPASE 24   No results for input(s): AMMONIA in the last 168 hours. CBC:  Recent Labs Lab 11/04/15 0015  WBC 7.9  HGB 15.1*  HCT 46.1*  MCV 92.6  PLT 177   Cardiac Enzymes: No results for input(s): CKTOTAL, CKMB, CKMBINDEX, TROPONINI in the last 168 hours. BNP: BNP (last 3 results) No results for input(s): BNP in the last 8760 hours.  ProBNP (last 3 results) No results for input(s): PROBNP in the last 8760 hours.  CBG: No results for input(s): GLUCAP in the last 168 hours.

## 2015-11-06 NOTE — Discharge Instructions (Signed)
Abdominal Pain, Adult Many things can cause belly (abdominal) pain. Most times, the belly pain is not dangerous. Many cases of belly pain can be watched and treated at home. HOME CARE   Do not take medicines that help you go poop (laxatives) unless told to by your doctor.  Only take medicine as told by your doctor.  Eat or drink as told by your doctor. Your doctor will tell you if you should be on a special diet. GET HELP IF:  You do not know what is causing your belly pain.  You have belly pain while you are sick to your stomach (nauseous) or have runny poop (diarrhea).  You have pain while you pee or poop.  Your belly pain wakes you up at night.  You have belly pain that gets worse or better when you eat.  You have belly pain that gets worse when you eat fatty foods.  You have a fever. GET HELP RIGHT AWAY IF:   The pain does not go away within 2 hours.  You keep throwing up (vomiting).  The pain changes and is only in the right or left part of the belly.  You have bloody or tarry looking poop. MAKE SURE YOU:   Understand these instructions.  Will watch your condition.  Will get help right away if you are not doing well or get worse.   This information is not intended to replace advice given to you by your health care provider. Make sure you discuss any questions you have with your health care provider.   Document Released: 05/17/2008 Document Revised: 12/20/2014 Document Reviewed: 08/08/2013 Elsevier Interactive Patient Education 2016 [employer]. Cholelithiasis Cholelithiasis (also called gallstones) is a form of gallbladder disease in which gallstones form in your gallbladder. The gallbladder is an organ that stores bile made in the liver, which helps digest fats. Gallstones begin as small crystals and slowly grow into stones. Gallstone pain occurs when the gallbladder spasms and a gallstone is blocking the duct. Pain can also occur when a stone passes out of the  duct.  RISK FACTORS  Being female.   Having multiple pregnancies. Health care providers sometimes advise removing diseased gallbladders before future pregnancies.   Being obese.  Eating a diet heavy in fried foods and fat.   Being older than 60 years and increasing age.   Prolonged use of medicines containing female hormones.   Having diabetes mellitus.   Rapidly losing weight.   Having a family history of gallstones (heredity).  SYMPTOMS  Nausea.   Vomiting.  Abdominal pain.   Yellowing of the skin (jaundice).   Sudden pain. It may persist from several minutes to several hours.  Fever.   Tenderness to the touch. In some cases, when gallstones do not move into the bile duct, people have no pain or symptoms. These are called "silent" gallstones.  TREATMENT Silent gallstones do not need treatment. In severe cases, emergency surgery may be required. Options for treatment include:  Surgery to remove the gallbladder. This is the most common treatment.  Medicines. These do not always work and may take 6-12 months or more to work.  Shock wave treatment (extracorporeal biliary lithotripsy). In this treatment an ultrasound machine sends shock waves to the gallbladder to break gallstones into smaller pieces that can pass into the intestines or be dissolved by medicine. HOME CARE INSTRUCTIONS   Only take over-the-counter or prescription medicines for pain, discomfort, or fever as directed by your health care provider.  Follow a low-fat diet until seen again by your health care provider. Fat causes the gallbladder to contract, which can result in pain.   Follow up with your health care provider as directed. Attacks are almost always recurrent and surgery is usually required for permanent treatment.  SEEK IMMEDIATE MEDICAL CARE IF:   Your pain increases and is not controlled by medicines.   You have a fever or persistent symptoms for more than 2-3 days.    You have a fever and your symptoms suddenly get worse.   You have persistent nausea and vomiting.  MAKE SURE YOU:   Understand these instructions.  Will watch your condition.  Will get help right away if you are not doing well or get worse.   This information is not intended to replace advice given to you by your health care provider. Make sure you discuss any questions you have with your health care provider.   Document Released: 11/25/2005 Document Revised: 08/01/2013 Document Reviewed: 05/23/2013 Elsevier Interactive Patient Education [employer]vier Inc.

## 2015-11-06 NOTE — Progress Notes (Signed)
Pt evaluated by [facility] and will be discharged back to her living facility. Son was at bedside this morning and is in agreement with plan. Awaiting transportation.

## 2015-11-10 ENCOUNTER — Non-Acute Institutional Stay (SKILLED_NURSING_FACILITY): Payer: Medicare PPO | Admitting: Nurse Practitioner

## 2015-11-10 ENCOUNTER — Encounter: Payer: Self-pay | Admitting: Nurse Practitioner

## 2015-11-10 DIAGNOSIS — G5792 Unspecified mononeuropathy of left lower limb: Secondary | ICD-10-CM | POA: Diagnosis not present

## 2015-11-10 DIAGNOSIS — G5791 Unspecified mononeuropathy of right lower limb: Secondary | ICD-10-CM | POA: Diagnosis not present

## 2015-11-10 DIAGNOSIS — E039 Hypothyroidism, unspecified: Secondary | ICD-10-CM

## 2015-11-10 DIAGNOSIS — J302 Other seasonal allergic rhinitis: Secondary | ICD-10-CM | POA: Diagnosis not present

## 2015-11-10 DIAGNOSIS — R609 Edema, unspecified: Secondary | ICD-10-CM

## 2015-11-10 DIAGNOSIS — G5793 Unspecified mononeuropathy of bilateral lower limbs: Secondary | ICD-10-CM

## 2015-11-10 DIAGNOSIS — K5900 Constipation, unspecified: Secondary | ICD-10-CM

## 2015-11-10 DIAGNOSIS — I10 Essential (primary) hypertension: Secondary | ICD-10-CM | POA: Diagnosis not present

## 2015-11-10 DIAGNOSIS — R945 Abnormal results of liver function studies: Secondary | ICD-10-CM

## 2015-11-10 DIAGNOSIS — R7989 Other specified abnormal findings of blood chemistry: Secondary | ICD-10-CM | POA: Diagnosis not present

## 2015-11-10 NOTE — Progress Notes (Signed)
Patient ID: [name], female   DOB: 1911/11/14, [age] y.o.   MRN: [id number]  Location:  SNF [facility] Provider:  Chipper OmanManXie Milan  NP  Code Status:  DNR Goals of care: Advanced Directive information    Chief Complaint  Patient presents with  . Medical Management of Chronic Issues     HPI: Patient is a [age] y.o. female [facility] for evaluation of thyroid, taking Levothyroxine 150mcg, last TSH 0.79 09/30/15. Chronic pain back, legs, well controlled on Fentanyl 1725mcg/hr, blood pressure is controlled with Furosemide 20mg  daily. Admitted to SNF due to her increased frailty gradually, insufficient self care at AL level.   Review of Systems:  Review of Systems  Constitutional: Negative for fever, chills and diaphoresis.  HENT: Positive for hearing loss (severe, f/u audiology, recently tube placed in the left ear drum,  hears better after impacted ceruen removed wtih ear lavage. ). Negative for congestion, ear discharge, ear pain and sore throat.   Eyes: Negative for photophobia, pain, discharge and redness.  Respiratory: Positive for cough. Negative for shortness of breath.   Cardiovascular: Positive for leg swelling (chronic. The patient stated its better than prior. ). Negative for chest pain and palpitations.       Chronic trace edema in ankles and stasis dermatitis, erythema, scaly, and painful ankles and feet. R>L  Gastrointestinal: Negative for nausea, vomiting, abdominal pain, diarrhea, constipation and blood in stool.  Genitourinary: Positive for frequency. Negative for dysuria, urgency, hematuria and flank pain.  Musculoskeletal: Positive for back pain. Negative for myalgias and neck pain.       Hand cram--warm water bottle helps at night.   Skin: Negative for rash.       Facial and right knee contusion.   Neurological: Negative for dizziness, tremors, seizures and weakness.  Endo/Heme/Allergies: Negative for environmental allergies and polydipsia. Does  not bruise/bleed easily.  Psychiatric/Behavioral: Negative for hallucinations. The patient is not nervous/anxious.     Past Medical History  Diagnosis Date  . Arthritis   . CHF (congestive heart failure) (HCC)   . Hypothyroidism   . HTN (hypertension), benign   . H/O: CVA (cerebrovascular accident)   . COPD (chronic obstructive pulmonary disease) (HCC)   . Unspecified hearing loss 02/08/2013  . Unspecified constipation 11/02/2012  . Candidiasis of other urogenital sites 08/10/2012  . Insomnia, unspecified 08/10/2012  . Pain in joint, ankle and foot 06/14/2012  . Other and unspecified hyperlipidemia 06/01/2012  . Contusion of face, scalp, and neck except eye(s) 06/01/2012  . Contusion of wrist 06/01/2012  . Edema 04/20/2012  . Open wound of knee, leg (except thigh), and ankle, without mention of complication 04/20/2012  . Closed fracture of sacrum and coccyx without mention of spinal cord injury 02/14/2012  . Unspecified hereditary and idiopathic peripheral neuropathy 01/27/2012  . Peripheral vascular disease, unspecified (HCC) 01/27/2012  . Other disorder of coccyx 01/27/2012  . Personal history of fall 01/27/2012  . Ventricular fibrillation (HCC) 01/24/2012  . Acute kidney failure, unspecified (HCC) 01/24/2012  . Cerebral embolism 05/23/2005  . Pneumonia, organism unspecified 01/23/2005  . COPD, mild (HCC) 01/20/2013  . History of cancer of uterus     Patient Active Problem List   Diagnosis Date Noted  . Nausea 11/04/2015  . Cholelithiasis 11/04/2015  . Generalized abdominal pain 11/04/2015  . Abnormal liver function tests   . Acute bronchitis 05/29/2015  . Gait instability 09/26/2014  . Arthralgia 09/26/2014  . Stasis dermatitis of both  legs 09/26/2014  . Hearing loss 09/26/2014  . Urinary frequency 02/24/2014  . Edema 08/09/2013  . Corn of toe 08/09/2013  . Constipation 05/03/2013  . Seasonal allergies 05/03/2013  . Neuropathic pain of both legs 05/03/2013  . Cramping of hands  05/03/2013  . COPD, mild (HCC) 01/20/2013  . HTN (hypertension) 01/20/2013  . Hypothyroidism 01/20/2013  . Rheumatic fever 09/26/1931    Allergies  Allergen Reactions  . Amoxicillin     Unknown, patient unable to answer questionnaire   . Avelox [Moxifloxacin]     Unknown: listed on MAR  . Erythromycin     Unknown: listed on MAR  . Monistat [Miconazole]     Unknown: listed on MAR  . Morphine And Related     Unknown: listed on MAR  . Orange Juice [Orange Oil]     Unknown: listed on MAR    Medications: Patient's Medications  New Prescriptions   No medications on file  Previous Medications   ACETAMINOPHEN (TYLENOL) 325 MG TABLET    Take 650 mg by mouth every 4 (four) hours as needed for pain. Pain/headache   ALUM & MAG HYDROXIDE-SIMETH (MAALOX/MYLANTA) 200-200-20 MG/5ML SUSPENSION    Take 30 mLs by mouth every 6 (six) hours as needed for indigestion or heartburn.   ASPIRIN 81 MG CHEWABLE TABLET    Chew 81 mg by mouth daily.   B COMPLEX-C (B-COMPLEX WITH VITAMIN C) TABLET    Take 1 tablet by mouth daily.   CALCIUM CITRATE-VITAMIN D (CITRACAL+D) 315-200 MG-UNIT PER TABLET    Take 1 tablet by mouth daily.   DOCUSATE SODIUM (COLACE) 100 MG CAPSULE    Take 200 mg by mouth daily.    FENTANYL (DURAGESIC - DOSED MCG/HR) 25 MCG/HR PATCH    Apply one patch topically every 3 days. Remove old patch before applying new patch   FEXOFENADINE (ALLEGRA) 60 MG TABLET    Take 60 mg by mouth daily.   FUROSEMIDE (LASIX) 20 MG TABLET    Take 1 tablet by mouth daily.   LEVOTHYROXINE (SYNTHROID, LEVOTHROID) 150 MCG TABLET    Take 1 tablet by mouth daily.   MAGNESIUM HYDROXIDE (MILK OF MAGNESIA) 400 MG/5ML SUSPENSION    Take 30 mLs by mouth daily as needed for mild constipation.   MULTIPLE VITAMINS-MINERALS (EQL CENTURY MATURE) TABS    Take 1 tablet by mouth daily.    POTASSIUM CHLORIDE (K-DUR,KLOR-CON) 10 MEQ TABLET    Take 1 tablet by mouth daily.  Modified Medications   No medications on file    Discontinued Medications   No medications on file    Physical Exam: Filed Vitals:   11/10/15 1537  BP: 130/70  Pulse: 72  Temp: 99.6 F (37.6 C)  TempSrc: Tympanic  Resp: 20   There is no weight on file to calculate BMI.  Physical Exam  Constitutional: She is oriented to person, place, and time. She appears well-developed and well-nourished. No distress.  HENT:  Head: Normocephalic and atraumatic.  Right Ear: External ear normal.  Left Ear: External ear normal.  Nose: Nose normal.  Mouth/Throat: Oropharynx is clear and moist. No oropharyngeal exudate.  Left TM tube seen.   Eyes: Conjunctivae and EOM are normal. Pupils are equal, round, and reactive to light. Right eye exhibits no discharge. Left eye exhibits no discharge.  Neck: Normal range of motion. Neck supple. No JVD present. No thyromegaly present.  Cardiovascular: Exam reveals no gallop and no friction rub.   No murmur heard. Pulmonary/Chest: Effort  normal. No respiratory distress. She has rales in the right lower field and the left lower field. She exhibits no tenderness.  Diffused rhonchi  Abdominal: Soft. Bowel sounds are normal. She exhibits no distension and no mass. There is no tenderness. There is no guarding. No hernia.  Musculoskeletal: Normal range of motion. She exhibits edema (pedal and ankles. ) and tenderness (c/o lower back and knees pain. ).  Chronic trace edema in ankles and stasis dermatitis, erythema, scaly, and painful ankles and feet. R>L  Lymphadenopathy:    She has no cervical adenopathy.  Neurological: She is alert and oriented to person, place, and time. She displays normal reflexes. No cranial nerve deficit. Coordination normal.  Skin: Skin is warm and dry. No rash noted. She is not diaphoretic. No erythema. No pallor.  chronic edema in ankles and stasis dermatitis, erythema, scaly, and painful ankles and feet. R>L  Psychiatric: She has a normal mood and affect. Her behavior is normal.  Judgment and thought content normal.  Some confusion    Labs reviewed: Basic Metabolic Panel:  Recent Labs  16/10/96 11/04/15 0140 11/06/15 0545  NA 138 137 140  K 4.1 3.9 3.7  CL  --  101 106  CO2  --  28 26  GLUCOSE  --  120* 81  BUN 20 28* 17  CREATININE 0.8 0.77 0.72  CALCIUM  --  8.9 8.8*    Liver Function Tests:  Recent Labs  11/04/15 0140 11/05/15 0530 11/06/15 0545  AST 171* 118* 89*  ALT 433* 298* 230*  ALKPHOS 177* 144* 126  BILITOT 1.7* 1.1 1.3*  PROT 6.5 5.8* 5.9*  ALBUMIN 3.4* 3.2* 3.1*    CBC:  Recent Labs  11/04/15 0015  WBC 7.9  HGB 15.1*  HCT 46.1*  MCV 92.6  PLT 177    Lab Results  Component Value Date   TSH 0.712 11/04/2015   No results found for: HGBA1C No results found for: CHOL, HDL, LDLCALC, LDLDIRECT, TRIG, CHOLHDL  Significant Diagnostic Results since last visit: none  Patient Care Team: Kimber Relic, MD as PCP - General (Internal Medicine) Kimber Relic, MD (Internal Medicine) [facility] Jayne Peckenpaugh X, NP as Nurse Practitioner (Nurse Practitioner) [facility] Odetta Forness X, NP as Nurse Practitioner (Nurse Practitioner)  Assessment/Plan Problem List Items Addressed This Visit    HTN (hypertension) - Primary (Chronic)    Mildly elevated sbp-150s-permissive blood pressure control. Continue Furosemide .       Hypothyroidism (Chronic)    Last TSH 0.786 09/30/15, continueLevothyroxine daily.       Edema (Chronic)    Minimal edema in ankles, continue Furosemide  daily      Constipation    No problem, continue Colace  daily.       Seasonal allergies    Asymptomatic, continue Fexofenadine  daily.       Neuropathic pain of both legs    Pain is better controlled, continue Fentanyl 78mcg/hr transdermal patch.      Abnormal liver function tests    Pending LFT, may be better since off Norco.           Family/ staff Communication: SNF for care needs  Labs/tests  ordered: none  [facility] Zaccheus Edmister NP Geriatrics [facility] [address], [place] [address] On Call:  [phone] & follow prompts after 5pm & weekends Office Phone:  [phone] Office Fax:  [phone]

## 2015-11-11 LAB — HEPATIC FUNCTION PANEL
ALT: 88 U/L — AB (ref 7–35)
AST: 31 U/L (ref 13–35)
Alkaline Phosphatase: 96 U/L (ref 25–125)
Bilirubin, Total: 0.7 mg/dL

## 2015-11-11 NOTE — Assessment & Plan Note (Signed)
Minimal edema in ankles, continue Furosemide 20mg  daily

## 2015-11-11 NOTE — Assessment & Plan Note (Signed)
Last TSH 0.786 09/30/15, continueLevothyroxine 150mcg daily.

## 2015-11-11 NOTE — Assessment & Plan Note (Signed)
Pending LFT, may be better since off Norco.

## 2015-11-11 NOTE — Assessment & Plan Note (Signed)
Pain is better controlled, continue Fentanyl 5225mcg/hr transdermal patch.

## 2015-11-11 NOTE — Assessment & Plan Note (Signed)
Mildly elevated sbp-150s-permissive blood pressure control. Continue Furosemide 20mg.  

## 2015-11-11 NOTE — Assessment & Plan Note (Signed)
No problem, continue Colace 200mg daily.  

## 2015-11-11 NOTE — Assessment & Plan Note (Signed)
Asymptomatic, continue Fexofenadine 60mg  daily.

## 2015-11-12 ENCOUNTER — Other Ambulatory Visit: Payer: Self-pay | Admitting: Nurse Practitioner

## 2015-11-12 DIAGNOSIS — R7989 Other specified abnormal findings of blood chemistry: Secondary | ICD-10-CM

## 2015-11-12 DIAGNOSIS — R945 Abnormal results of liver function studies: Secondary | ICD-10-CM

## 2015-11-14 ENCOUNTER — Non-Acute Institutional Stay (SKILLED_NURSING_FACILITY): Payer: Medicare PPO | Admitting: Internal Medicine

## 2015-11-14 ENCOUNTER — Encounter: Payer: Self-pay | Admitting: Internal Medicine

## 2015-11-14 DIAGNOSIS — K8010 Calculus of gallbladder with chronic cholecystitis without obstruction: Secondary | ICD-10-CM | POA: Diagnosis not present

## 2015-11-14 DIAGNOSIS — R7989 Other specified abnormal findings of blood chemistry: Secondary | ICD-10-CM

## 2015-11-14 DIAGNOSIS — I10 Essential (primary) hypertension: Secondary | ICD-10-CM | POA: Diagnosis not present

## 2015-11-14 DIAGNOSIS — R609 Edema, unspecified: Secondary | ICD-10-CM | POA: Diagnosis not present

## 2015-11-14 DIAGNOSIS — E039 Hypothyroidism, unspecified: Secondary | ICD-10-CM | POA: Diagnosis not present

## 2015-11-14 DIAGNOSIS — R2681 Unsteadiness on feet: Secondary | ICD-10-CM | POA: Diagnosis not present

## 2015-11-14 DIAGNOSIS — M2550 Pain in unspecified joint: Secondary | ICD-10-CM

## 2015-11-14 DIAGNOSIS — R945 Abnormal results of liver function studies: Secondary | ICD-10-CM

## 2015-11-14 DIAGNOSIS — R739 Hyperglycemia, unspecified: Secondary | ICD-10-CM | POA: Diagnosis not present

## 2015-11-14 DIAGNOSIS — S32000A Wedge compression fracture of unspecified lumbar vertebra, initial encounter for closed fracture: Secondary | ICD-10-CM | POA: Insufficient documentation

## 2015-11-14 HISTORY — DX: Hyperglycemia, unspecified: R73.9

## 2015-11-14 NOTE — Progress Notes (Signed)
Patient ID: [name], female   DOB: 1911-11-16, 81 y.o.   MRN: [id number]    HISTORY AND PHYSICAL  Location:  [place] Room Number: 4 B Place of Service:     Extended Emergency Contact Information Primary Emergency Contact: Tracie, Norman [place] Phone: [phone] Relation: Son  Advanced Directive information Does patient have an advance directive?: Yes, Type of Advance Directive: Healthcare Power of [place];Out of facility DNR (pink MOST or yellow form), Pre-existing out of facility DNR order (yellow form or pink MOST form): Yellow form placed in chart (order not valid for inpatient use), Does patient want to make changes to advanced directive?: No - Patient declined  Chief Complaint  Patient presents with  . New Admit To SNF    Following hospitalization    HPI:  Admitted to SNF 11/06/2015 following hospitalization from 11/03/2015 through 11/06/2015. S48 36-year-old female was admitted to the hospital for an episode of nonbloody vomiting and abdominal pain. Workup showed that she had cholelithiasis and elevated liver enzymes. Her symptoms resolved and she was not felt to be a surgical candidate.  There was mild hyperglycemia due to 120 mg percent while hospitalized. She has had some confusion.  X-rays had an incidental finding of compression fractures at L1 and L2, but these appear to be old dating back 2013 and she had no acute lower back pain.  She does have significant osteoarthritis with diffuse pain related to that. He was started on a fentanyl patch, but it seems to be making her drowsy and has not helped her pain is very much. He is willing to discontinue this.  She is engaged in physical therapy and occupational therapy. She previously resided in the assisted living area, but current care needs are too great to continue that.  Past Medical History  Diagnosis Date  . Arthritis   . CHF (congestive heart failure)  ([place])   . Hypothyroidism   . HTN (hypertension), benign   . H/O: CVA (cerebrovascular accident)   . COPD (chronic obstructive pulmonary disease) ([place])   . Unspecified hearing loss 02/08/2013  . Unspecified constipation 11/02/2012  . Candidiasis of other urogenital sites 08/10/2012  . Insomnia, unspecified 08/10/2012  . Pain in joint, ankle and foot 06/14/2012  . Other and unspecified hyperlipidemia 06/01/2012  . Contusion of face, scalp, and neck except eye(s) 06/01/2012  . Contusion of wrist 06/01/2012  . Edema 04/20/2012  . Open wound of knee, leg (except thigh), and ankle, without mention of complication 04/19/5276  . Closed fracture of sacrum and coccyx without mention of spinal cord injury 02/14/2012  . Unspecified hereditary and idiopathic peripheral neuropathy 01/27/2012  . Peripheral vascular disease, unspecified ([place]) 01/27/2012  . Other disorder of coccyx 01/27/2012  . Personal history of fall 01/27/2012  . Ventricular fibrillation ([place]) 01/24/2012  . Acute kidney failure, unspecified ([place]) 01/24/2012  . Cerebral embolism 05/23/2005  . Pneumonia, organism unspecified 01/23/2005  . COPD, mild ([place]) 01/20/2013  . History of cancer of uterus   . Abnormal liver function tests     11/11/15 AST 31, ALT 88, alk phos 96   . Arthralgia 09/26/2014    Multiple joints: knees, shoulders, wrists, spine hips   . Cholelithiasis 11/04/2015  . Constipation 05/03/2013  . Hearing loss 09/26/2014  . Rheumatic fever 09/26/1933    Age 72 10/01/14 ESR 13, RAF <10   . Seasonal allergies 05/03/2013  . Stasis dermatitis of both legs 09/26/2014  . Urinary  frequency 02/24/2014  . Hyperglycemia 11/14/2015    Past Surgical History  Procedure Laterality Date  . Abdominal hysterectomy  1990    for endometrial cancer  . Tonsillectomy  1916  . Mastoidectomy  1920    bilateral  . Gum surgery  1932  . Ectopic pregnancy surgery  1952  . Thyroidectomy  1960  . Cataract extraction w/ intraocular lens  implant, bilateral        Patient Care Team: [name], MD as PCP - General (Internal Medicine) [name], MD (Internal Medicine) [place] Man Mast X, NP as Nurse Practitioner (Nurse Practitioner) [place] Man Mast X, NP as Nurse Practitioner (Nurse Practitioner)  Social History   Social History  . Marital Status: Widowed    Spouse Name: N/A  . Number of Children: N/A  . Years of Education: N/A   Occupational History  . retired Pharmacist, hospital    Social History Main Topics  . Smoking status: Never Smoker   . Smokeless tobacco: Never Used  . Alcohol Use: No  . Drug Use: No  . Sexual Activity: No   Other Topics Concern  . Not on file   Social History Narrative   Lives at [facility]   Widowed   Never smoked   Alcohol none   Exercise chair exercise   Walks with walker    reports that she has never smoked. She has never used smokeless tobacco. She reports that she does not drink alcohol or use illicit drugs.  Family History  Problem Relation Age of Onset  . Stroke Mother   . Heart disease Father    Family Status  Relation Status Death Age  . Mother Deceased 8  . Father Deceased 29  . Sister Deceased   . Brother Alive   . Daughter Alive   . Son Alive   . Son Alive   . Sister Deceased     Immunization History  Administered Date(s) Administered  . Influenza Whole 10/19/2012  . Influenza-Unspecified 10/18/2013, 10/17/2014  . PPD Test 01/22/2013  . Pneumococcal Polysaccharide-23 01/22/2013  . Td 02/21/2009  . Tdap 04/07/2015    Allergies  Allergen Reactions  . Amoxicillin     Unknown, patient unable to answer questionnaire   . Avelox [Moxifloxacin]     Unknown: listed on MAR  . Erythromycin     Unknown: listed on MAR  . Monistat [Miconazole]     Unknown: listed on MAR  . Morphine And Related     Unknown: listed on MAR  . Orange Juice [Orange Oil]     Unknown: listed on MAR    Medications: Patient's Medications  New  Prescriptions   No medications on file  Previous Medications   ACETAMINOPHEN (TYLENOL) 325 MG TABLET    Take 650 mg by mouth every 4 (four) hours as needed for pain. Pain/headache   ALUM & MAG HYDROXIDE-SIMETH (MAALOX/MYLANTA) 200-200-20 MG/5ML SUSPENSION    Take 30 mLs by mouth every 6 (six) hours as needed for indigestion or heartburn.   ASPIRIN 81 MG CHEWABLE TABLET    Chew 81 mg by mouth daily.   B COMPLEX-C (B-COMPLEX WITH VITAMIN C) TABLET    Take 1 tablet by mouth daily.   CALCIUM CITRATE-VITAMIN D (CITRACAL+D) 315-200 MG-UNIT PER TABLET    Take 1 tablet by mouth daily.   DOCUSATE SODIUM (COLACE) 100 MG CAPSULE    Take 200 mg by mouth daily.    FENTANYL ([place] -  DOSED MCG/HR) 25 MCG/HR PATCH    Apply one patch topically every 3 days. Remove old patch before applying new patch   FEXOFENADINE (ALLEGRA) 60 MG TABLET    Take 60 mg by mouth daily.   FUROSEMIDE (LASIX) 20 MG TABLET    Take 1 tablet by mouth daily.   LEVOTHYROXINE (SYNTHROID, LEVOTHROID) 150 MCG TABLET    Take 1 tablet by mouth daily.   MAGNESIUM HYDROXIDE (MILK OF MAGNESIA) 400 MG/5ML SUSPENSION    Take 30 mLs by mouth daily as needed for mild constipation.   MULTIPLE VITAMINS-MINERALS (EQL CENTURY MATURE) TABS    Take 1 tablet by mouth daily.    POTASSIUM CHLORIDE (K-DUR,KLOR-CON) 10 MEQ TABLET    Take 1 tablet by mouth daily.  Modified Medications   No medications on file  Discontinued Medications   No medications on file    Review of Systems  Constitutional: Positive for fatigue. Negative for fever, chills and diaphoresis.  HENT: Positive for hearing loss (severe, f/u audiology, recently tube placed in the left ear drum,  hears better after impacted ceruen removed wtih ear lavage. ). Negative for congestion, ear discharge, ear pain and sore throat.   Eyes: Negative for photophobia, pain, discharge and redness.  Respiratory: Negative for cough and shortness of breath.   Cardiovascular: Negative for chest pain,  palpitations and leg swelling (chronic. The patient stated it is better.).       Chronic trace edema in ankles and stasis dermatitis, erythema, scaly, and painful ankles and feet. R>L  Gastrointestinal: Negative for nausea, vomiting, abdominal pain, diarrhea, constipation and blood in stool.       Mild dysphagia. Cholelithiasis with sludge.  Endocrine: Negative for polydipsia.  Genitourinary: Positive for frequency. Negative for dysuria, urgency, hematuria and flank pain.  Musculoskeletal: Positive for back pain. Negative for myalgias and neck pain.       Hand cramp--warm water bottle helps at night.  Generalized osteoarthritis with significant pain and large joints including shoulders, hips, knees.  Skin: Negative for rash.       Facial and right knee contusion.   Allergic/Immunologic: Negative for environmental allergies.  Neurological: Positive for weakness. Negative for dizziness, tremors and seizures.  Hematological: Does not bruise/bleed easily.  Psychiatric/Behavioral: Positive for confusion (mild). Negative for hallucinations. The patient is not nervous/anxious.     Filed Vitals:   11/14/15 1621  BP: 118/68  Pulse: 72  Temp: 97.8 F (36.6 C)  Resp: 18  Height: 5' (1.524 m)  Weight: 131 lb (59.421 kg)   Body mass index is 25.58 kg/(m^2).  Physical Exam  Constitutional: She is oriented to person, place, and time. She appears well-developed and well-nourished. No distress.  HENT:  Head: Normocephalic and atraumatic.  Right Ear: External ear normal.  Left Ear: External ear normal.  Nose: Nose normal.  Mouth/Throat: Oropharynx is clear and moist. No oropharyngeal exudate.  Left TM tube seen.   Eyes: Conjunctivae and EOM are normal. Pupils are equal, round, and reactive to light. Right eye exhibits no discharge. Left eye exhibits no discharge.  Neck: Normal range of motion. Neck supple. No JVD present. No thyromegaly present.  Cardiovascular: Exam reveals no gallop and no  friction rub.   No murmur heard. Pulmonary/Chest: Effort normal. No respiratory distress. She has rales in the right lower field and the left lower field. She exhibits no tenderness.  Diffused rhonchi  Abdominal: Soft. Bowel sounds are normal. She exhibits no distension and no mass. There is no tenderness.  There is no guarding. No hernia.  Musculoskeletal: Normal range of motion. She exhibits edema (pedal and ankles. ) and tenderness (c/o lower back and knees pain. ).  Chronic trace edema in ankles and stasis dermatitis, erythema, scaly, and painful ankles and feet. R>L  Lymphadenopathy:    She has no cervical adenopathy.  Neurological: She is alert and oriented to person, place, and time. She displays normal reflexes. No cranial nerve deficit. Coordination normal.  Skin: Skin is warm and dry. No rash noted. She is not diaphoretic. No erythema. No pallor.  chronic edema in ankles and stasis dermatitis, erythema, scaly, and painful ankles and feet. R>L  Psychiatric: She has a normal mood and affect. Her behavior is normal. Judgment and thought content normal.  Some confusion    Labs reviewed: Lab Summary Latest Ref Rng 11/11/2015 11/06/2015 11/05/2015 11/04/2015  Hemoglobin 12.0 - 15.0 g/dL (None) (None) (None) 15.1(H)  Hematocrit 36.0 - 46.0 % (None) (None) (None) 46.1(H)  White count 4.0 - 10.5 K/uL (None) (None) (None) 7.9  Platelet count 150 - 400 K/uL (None) (None) (None) 177  Sodium 135 - 145 mmol/L (None) 140 (None) 137  Potassium 3.5 - 5.1 mmol/L (None) 3.7 (None) 3.9  Calcium 8.9 - 10.3 mg/dL (None) 8.8(L) (None) 8.9  Phosphorus - (None) (None) (None) (None)  Creatinine 0.44 - 1.00 mg/dL (None) 0.72 (None) 0.77  AST 13 - 35 U/L 31 89(H) 118(H) 171(H)  Alk Phos 25 - 125 U/L 96 126 144(H) 177(H)  Bilirubin 0.3 - 1.2 mg/dL (None) 1.3(H) 1.1 1.7(H)  Glucose 65 - 99 mg/dL (None) 81 (None) 120(H)  Cholesterol - (None) (None) (None) (None)  HDL cholesterol - (None) (None) (None)  (None)  Triglycerides - (None) (None) (None) (None)  LDL Direct - (None) (None) (None) (None)  LDL Calc - (None) (None) (None) (None)  Total protein 6.5 - 8.1 g/dL (None) 5.9(L) 5.8(L) 6.5  Albumin 3.5 - 5.0 g/dL (None) 3.1(L) 3.2(L) 3.4(L)   Lab Results  Component Value Date   BUN 17 11/06/2015   No results found for: HGBA1C Lab Results  Component Value Date   TSH 0.712 11/04/2015          Ct Abdomen Pelvis Wo Contrast  11/04/2015  CLINICAL DATA:  Increasing general abdominal pain for 2 days. Nausea. Pain with urination. History of uterine cancer. EXAM: CT ABDOMEN AND PELVIS WITHOUT CONTRAST TECHNIQUE: Multidetector CT imaging of the abdomen and pelvis was performed following the standard protocol without IV contrast. COMPARISON:  08/07/2004 FINDINGS: Atelectasis in the lung bases. Bronchial wall thickening suggesting bronchitis. Coronary artery calcifications. Cholelithiasis with mild gallbladder distention. No bile duct dilatation. Multiple lesions demonstrated in the left kidney likely representing parenchymal and parapelvic cysts. No hydronephrosis or hydroureter in either kidney. No renal, ureteral, or bladder stones. Bladder is decompressed and cannot be evaluated for wall thickness. The unenhanced appearance of the liver, spleen, pancreas, adrenal glands, inferior vena cava, and retroperitoneal lymph nodes is unremarkable. Calcification of abdominal aorta without aneurysm. Stomach, small bowel, and colon are decompressed. Scattered stool in the colon. No free air or free fluid in the abdomen. Pelvis: Appendix is not identified. Uterus is surgically absent. No mass or adenopathy in the pelvis. Diverticulosis of the sigmoid colon without diverticulitis. No free or loculated pelvic fluid collections. Degenerative changes in the spine. Compression deformities of L1 and L2. These findings are unchanged since previous MRI lumbar spine 02/04/2012. IMPRESSION: Bronchial wall thickening in  the lung bases suggesting bronchitis. Cholelithiasis with mild  gallbladder distention. No renal or ureteral stone or obstruction. Electronically Signed   By: Lucienne Capers M.D.   On: 11/04/2015 02:13   US Abdomen Limited Ruq  11/04/2015  CLINICAL DATA:  Abdominal pain for 2 days. Elevated liver function studies. EXAM: US ABDOMEN LIMITED - RIGHT UPPER QUADRANT COMPARISON:  CT abdomen and pelvis 11/04/2015 FINDINGS: Gallbladder: Cholelithiasis with several stones in the gallbladder, largest measuring 8 mm. Gallbladder sludge. Gallbladder wall is upper limits of normal for thickness. Murphy's sign is negative. Common bile duct: Diameter: 3 mm, normal Liver: No focal lesion identified. Within normal limits in parenchymal echogenicity. IMPRESSION: Cholelithiasis with gallbladder sludge.  No bile duct dilatation. Electronically Signed   By: Lucienne Capers M.D.   On: 11/04/2015 03:56     Assessment/Plan \1. Cholelithiasis with cholecystitis of other acuity Continue to monitor liver enzymes. Abdomen is nontender and she has no nausea  2. Essential hypertension Controlled  3. Arthralgia Discontinue fentanyl Start Cymbalta 30 mg daily  4. Gait instability Continue to work with physical therapy  5. Edema, unspecified type Basically unchanged  6. Hyperglycemia Follow-up lab in future  7. Hypothyroidism, unspecified hypothyroidism type Currently stable  8. Abnormal liver function tests Follow-up lab in future

## 2015-11-19 ENCOUNTER — Non-Acute Institutional Stay (SKILLED_NURSING_FACILITY): Payer: Medicare PPO | Admitting: Nurse Practitioner

## 2015-11-19 DIAGNOSIS — G5792 Unspecified mononeuropathy of left lower limb: Secondary | ICD-10-CM

## 2015-11-19 DIAGNOSIS — J449 Chronic obstructive pulmonary disease, unspecified: Secondary | ICD-10-CM | POA: Diagnosis not present

## 2015-11-19 DIAGNOSIS — K5900 Constipation, unspecified: Secondary | ICD-10-CM

## 2015-11-19 DIAGNOSIS — I10 Essential (primary) hypertension: Secondary | ICD-10-CM | POA: Diagnosis not present

## 2015-11-19 DIAGNOSIS — R609 Edema, unspecified: Secondary | ICD-10-CM

## 2015-11-19 DIAGNOSIS — E039 Hypothyroidism, unspecified: Secondary | ICD-10-CM

## 2015-11-19 DIAGNOSIS — G5791 Unspecified mononeuropathy of right lower limb: Secondary | ICD-10-CM | POA: Diagnosis not present

## 2015-11-19 DIAGNOSIS — G5793 Unspecified mononeuropathy of bilateral lower limbs: Secondary | ICD-10-CM

## 2015-11-19 DIAGNOSIS — R7989 Other specified abnormal findings of blood chemistry: Secondary | ICD-10-CM

## 2015-11-19 DIAGNOSIS — R945 Abnormal results of liver function studies: Secondary | ICD-10-CM

## 2015-11-19 LAB — BASIC METABOLIC PANEL
BUN: 22 mg/dL — AB (ref 4–21)
CREATININE: 0.6 mg/dL (ref 0.5–1.1)
GLUCOSE: 103 mg/dL
POTASSIUM: 4.7 mmol/L (ref 3.4–5.3)
Sodium: 136 mmol/L — AB (ref 137–147)

## 2015-11-19 LAB — HEPATIC FUNCTION PANEL
ALT: 27 U/L (ref 7–35)
AST: 29 U/L (ref 13–35)
Alkaline Phosphatase: 69 U/L (ref 25–125)
Bilirubin, Total: 0.6 mg/dL

## 2015-11-20 NOTE — Assessment & Plan Note (Signed)
11/19/15 reported resolved visual hallucination x 2 days, seeing bugs and things were not there, resolved after Cymbalta held. Will dc. Continue Fentanyl

## 2015-11-20 NOTE — Assessment & Plan Note (Addendum)
may be better since off Norco. 11/11/15 AST 31, ALT 88, alk phos 96, 11/19/15 AST 29, ALT 27, alk phos 69

## 2015-11-20 NOTE — Progress Notes (Signed)
Patient ID: [name], female   DOB: January 07, 1911, 81 y.o.   MRN: [id number]  Location:  SNF [facility] Provider:  Marlana Latus NP  Code Status:  DNR Goals of care: Advanced Directive information    Chief Complaint  Patient presents with  . Medical Management of Chronic Issues  . Acute Visit    visual hallucination     HPI: Patient is a 81 y.o. female seen in the SNF at [facility] today for evaluation of 11/19/15 reported resolved visual hallucination x 2 days, seeing bugs and things were not there, resolved after Cymbalta held. thyroid, taking Levothyroxine 178mg, last TSH 0.79 09/30/15. Chronic pain back, legs, well controlled on Fentanyl 268m/hr, blood pressure is controlled with Furosemide 2029maily.   Review of Systems:  Review of Systems  Constitutional: Negative for fever, chills and diaphoresis.  HENT: Positive for hearing loss (severe, f/u audiology, recently tube placed in the left ear drum,  hears better after impacted ceruen removed wtih ear lavage. ). Negative for congestion, ear discharge, ear pain and sore throat.   Eyes: Negative for photophobia, pain, discharge and redness.  Respiratory: Positive for cough. Negative for shortness of breath.   Cardiovascular: Positive for leg swelling (chronic. The patient stated its better than prior. ). Negative for chest pain and palpitations.       Chronic trace edema in ankles and stasis dermatitis, erythema, scaly, and painful ankles and feet. R>L  Gastrointestinal: Negative for nausea, vomiting, abdominal pain, diarrhea, constipation and blood in stool.  Genitourinary: Positive for frequency. Negative for dysuria, urgency, hematuria and flank pain.  Musculoskeletal: Positive for back pain. Negative for myalgias and neck pain.       Hand cram--warm water bottle helps at night.   Skin: Negative for rash.       Facial and right knee contusion.   Neurological: Negative for dizziness, tremors, seizures and weakness.    Endo/Heme/Allergies: Negative for environmental allergies and polydipsia. Does not bruise/bleed easily.  Psychiatric/Behavioral: Positive for hallucinations. The patient is not nervous/anxious.        Resolved visual hallucination after Cymbalta held.     Past Medical History  Diagnosis Date  . Arthritis   . CHF (congestive heart failure) ([place]CC[place] . Hypothyroidism   . HTN (hypertension), benign   . H/O: CVA (cerebrovascular accident)   . COPD (chronic obstructive pulmonary disease) ([place]CC[place] . Unspecified hearing loss 02/08/2013  . Unspecified constipation 11/02/2012  . Candidiasis of other urogenital sites 08/10/2012  . Insomnia, unspecified 08/10/2012  . Pain in joint, ankle and foot 06/14/2012  . Other and unspecified hyperlipidemia 06/01/2012  . Contusion of face, scalp, and neck except eye(s) 06/01/2012  . Contusion of wrist 06/01/2012  . Edema 04/20/2012  . Open wound of knee, leg (except thigh), and ankle, without mention of complication 5/91/05/736 Closed fracture of sacrum and coccyx without mention of spinal cord injury 02/14/2012  . Unspecified hereditary and idiopathic peripheral neuropathy 01/27/2012  . Peripheral vascular disease, unspecified ([place]CC[place]/14/2013  . Other disorder of coccyx 01/27/2012  . Personal history of fall 01/27/2012  . Ventricular fibrillation (HCCRosine/10/2012  . Acute kidney failure, unspecified ([place]CC[place]/10/2012  . Cerebral embolism 05/23/2005  . Pneumonia, organism unspecified 01/23/2005  . COPD, mild ([place]/07/2013  . History of cancer of uterus   . Abnormal liver function tests     11/11/15 AST 31, ALT 88, alk phos 96   . Arthralgia 09/26/2014  Multiple joints: knees, shoulders, wrists, spine hips   . Cholelithiasis 11/04/2015  . Constipation 05/03/2013  . Hearing loss 09/26/2014  . Rheumatic fever 09/26/1933    Age 25 10/01/14 ESR 13, RAF <10   . Seasonal allergies 05/03/2013  . Stasis dermatitis of both legs 09/26/2014  . Urinary frequency 02/24/2014  .  Hyperglycemia 11/14/2015    Patient Active Problem List   Diagnosis Date Noted  . Compression fracture of lumbar vertebra ([place]) 11/14/2015  . Hyperglycemia 11/14/2015  . Cholelithiasis 11/04/2015  . Abnormal liver function tests   . Gait instability 09/26/2014  . Arthralgia 09/26/2014  . Stasis dermatitis of both legs 09/26/2014  . Hearing loss 09/26/2014  . Urinary frequency 02/24/2014  . Edema 08/09/2013  . Corn of toe 08/09/2013  . Constipation 05/03/2013  . Seasonal allergies 05/03/2013  . Neuropathic pain of both legs 05/03/2013  . Cramping of hands 05/03/2013  . COPD, mild ([place]) 01/20/2013  . HTN (hypertension) 01/20/2013  . Hypothyroidism 01/20/2013  . Rheumatic fever 09/26/1933    Allergies  Allergen Reactions  . Amoxicillin     Unknown, patient unable to answer questionnaire   . Avelox [Moxifloxacin]     Unknown: listed on MAR  . Erythromycin     Unknown: listed on MAR  . Monistat [Miconazole]     Unknown: listed on MAR  . Morphine And Related     Unknown: listed on MAR  . Orange Juice [Orange Oil]     Unknown: listed on MAR    Medications: Patient's Medications  New Prescriptions   No medications on file  Previous Medications   ACETAMINOPHEN (TYLENOL) 325 MG TABLET    Take 650 mg by mouth every 4 (four) hours as needed for pain. Pain/headache   ALUM & MAG HYDROXIDE-SIMETH (MAALOX/MYLANTA) 200-200-20 MG/5ML SUSPENSION    Take 30 mLs by mouth every 6 (six) hours as needed for indigestion or heartburn.   ASPIRIN 81 MG CHEWABLE TABLET    Chew 81 mg by mouth daily.   B COMPLEX-C (B-COMPLEX WITH VITAMIN C) TABLET    Take 1 tablet by mouth daily.   CALCIUM CITRATE-VITAMIN D (CITRACAL+D) 315-200 MG-UNIT PER TABLET    Take 1 tablet by mouth daily.   DOCUSATE SODIUM (COLACE) 100 MG CAPSULE    Take 200 mg by mouth daily.    FENTANYL (DURAGESIC - DOSED MCG/HR) 25 MCG/HR PATCH    Apply one patch topically every 3 days. Remove old patch before applying new patch    FEXOFENADINE (ALLEGRA) 60 MG TABLET    Take 60 mg by mouth daily.   FUROSEMIDE (LASIX) 20 MG TABLET    Take 1 tablet by mouth daily.   LEVOTHYROXINE (SYNTHROID, LEVOTHROID) 150 MCG TABLET    Take 1 tablet by mouth daily.   MAGNESIUM HYDROXIDE (MILK OF MAGNESIA) 400 MG/5ML SUSPENSION    Take 30 mLs by mouth daily as needed for mild constipation.   MULTIPLE VITAMINS-MINERALS (EQL CENTURY MATURE) TABS    Take 1 tablet by mouth daily.    POTASSIUM CHLORIDE (K-DUR,KLOR-CON) 10 MEQ TABLET    Take 1 tablet by mouth daily.  Modified Medications   No medications on file  Discontinued Medications   No medications on file    Physical Exam: Filed Vitals:   11/19/15 1413  BP: 126/78  Pulse: 72  Temp: 97.9 F (36.6 C)  TempSrc: Tympanic  Resp: 18   There is no weight on file to calculate BMI.  Physical Exam  Constitutional: She  is oriented to person, place, and time. She appears well-developed and well-nourished. No distress.  HENT:  Head: Normocephalic and atraumatic.  Right Ear: External ear normal.  Left Ear: External ear normal.  Nose: Nose normal.  Mouth/Throat: Oropharynx is clear and moist. No oropharyngeal exudate.  Left TM tube seen.   Eyes: Conjunctivae and EOM are normal. Pupils are equal, round, and reactive to light. Right eye exhibits no discharge. Left eye exhibits no discharge.  Neck: Normal range of motion. Neck supple. No JVD present. No thyromegaly present.  Cardiovascular: Exam reveals no gallop and no friction rub.   No murmur heard. Pulmonary/Chest: Effort normal. No respiratory distress. She has rales in the right lower field and the left lower field. She exhibits no tenderness.  Abdominal: Soft. Bowel sounds are normal. She exhibits no distension and no mass. There is no tenderness. There is no guarding. No hernia.  Musculoskeletal: Normal range of motion. She exhibits edema (pedal and ankles. ) and tenderness (c/o lower back and knees pain. ).  Chronic trace edema  in ankles and stasis dermatitis, erythema, scaly, and painful ankles and feet. R>L  Lymphadenopathy:    She has no cervical adenopathy.  Neurological: She is alert and oriented to person, place, and time. She displays normal reflexes. No cranial nerve deficit. Coordination normal.  Skin: Skin is warm and dry. No rash noted. She is not diaphoretic. No erythema. No pallor.  chronic edema in ankles and stasis dermatitis, erythema, scaly, and painful ankles and feet. R>L  Psychiatric: She has a normal mood and affect. Her behavior is normal. Judgment and thought content normal.  Some confusion    Labs reviewed: Basic Metabolic Panel:  Recent Labs  11/04/15 0140 11/06/15 0545 11/19/15  NA 137 140 136*  K 3.9 3.7 4.7  CL 101 106  --   CO2 28 26  --   GLUCOSE 120* 81  --   BUN 28* 17 22*  CREATININE 0.77 0.72 0.6  CALCIUM 8.9 8.8*  --     Liver Function Tests:  Recent Labs  11/04/15 0140 11/05/15 0530 11/06/15 0545 11/11/15 11/19/15  AST 171* 118* 89* 31 29  ALT 433* 298* 230* 88* 27  ALKPHOS 177* 144* 126 96 69  BILITOT 1.7* 1.1 1.3*  --   --   PROT 6.5 5.8* 5.9*  --   --   ALBUMIN 3.4* 3.2* 3.1*  --   --     CBC:  Recent Labs  11/04/15 0015  WBC 7.9  HGB 15.1*  HCT 46.1*  MCV 92.6  PLT 177    Lab Results  Component Value Date   TSH 0.712 11/04/2015   No results found for: HGBA1C No results found for: CHOL, HDL, LDLCALC, LDLDIRECT, TRIG, CHOLHDL  Significant Diagnostic Results since last visit: none  Patient Care Team: [name], MD as PCP - General (Internal Medicine) [name], MD (Internal Medicine) LeRoy Callia Swim X, NP as Nurse Practitioner (Nurse Practitioner) [place] Harout Scheurich X, NP as Nurse Practitioner (Nurse Practitioner)  Assessment/Plan Problem List Items Addressed This Visit    COPD, mild ([place]) (Chronic)    05/26/15 CXR no active cardiopulmonary disease suspected.  Stable.       HTN  (hypertension) (Chronic)    Mildly elevated sbp-150s-permissive blood pressure control. Continue Furosemide 25m.       Hypothyroidism (Chronic)    Last TSH 0.786 09/30/15, continueLevothyroxine 1553m daily.       Edema (  Chronic)    Minimal edema in ankles, continue Furosemide 21m daily      Constipation    No problem, continue Colace 2038mdaily.       Neuropathic pain of both legs - Primary    11/19/15 reported resolved visual hallucination x 2 days, seeing bugs and things were not there, resolved after Cymbalta held. Will dc. Continue Fentanyl       Abnormal liver function tests    may be better since off Norco. 11/11/15 AST 31, ALT 88, alk phos 96, 11/19/15 AST 29, ALT 27, alk phos 69           Family/ staff Communication: may dc AL when able.   Labs/tests ordered: none  ManXie Franke Menter NP Geriatrics [facility] [address] [address] Call:  [phone] follow prompts after 5pm & weekends Office Phone:  [phone] Fax:  [phone]

## 2015-11-20 NOTE — Assessment & Plan Note (Signed)
05/26/15 CXR no active cardiopulmonary disease suspected.  Stable.

## 2015-11-20 NOTE — Assessment & Plan Note (Signed)
Last TSH 0.786 09/30/15, continueLevothyroxine 150mcg daily.  

## 2015-11-20 NOTE — Assessment & Plan Note (Signed)
Mildly elevated sbp-150s-permissive blood pressure control. Continue Furosemide 20mg .

## 2015-11-20 NOTE — Assessment & Plan Note (Signed)
Minimal edema in ankles, continue Furosemide 20mg daily 

## 2015-11-20 NOTE — Assessment & Plan Note (Signed)
No problem, continue Colace 200mg  daily.

## 2016-01-22 ENCOUNTER — Non-Acute Institutional Stay: Payer: 59 | Admitting: Nurse Practitioner

## 2016-01-22 ENCOUNTER — Encounter: Payer: Self-pay | Admitting: Nurse Practitioner

## 2016-01-22 DIAGNOSIS — G5792 Unspecified mononeuropathy of left lower limb: Secondary | ICD-10-CM

## 2016-01-22 DIAGNOSIS — I10 Essential (primary) hypertension: Secondary | ICD-10-CM | POA: Diagnosis not present

## 2016-01-22 DIAGNOSIS — M2550 Pain in unspecified joint: Secondary | ICD-10-CM | POA: Diagnosis not present

## 2016-01-22 DIAGNOSIS — G5791 Unspecified mononeuropathy of right lower limb: Secondary | ICD-10-CM | POA: Diagnosis not present

## 2016-01-22 DIAGNOSIS — E039 Hypothyroidism, unspecified: Secondary | ICD-10-CM | POA: Diagnosis not present

## 2016-01-22 DIAGNOSIS — K5900 Constipation, unspecified: Secondary | ICD-10-CM

## 2016-01-22 DIAGNOSIS — R609 Edema, unspecified: Secondary | ICD-10-CM

## 2016-01-22 DIAGNOSIS — J449 Chronic obstructive pulmonary disease, unspecified: Secondary | ICD-10-CM

## 2016-01-22 DIAGNOSIS — G5793 Unspecified mononeuropathy of bilateral lower limbs: Secondary | ICD-10-CM

## 2016-01-23 ENCOUNTER — Encounter: Payer: Self-pay | Admitting: Nurse Practitioner

## 2016-01-26 NOTE — Assessment & Plan Note (Signed)
Last TSH 0.786 09/30/15, continueLevothyroxine daily, update TSH

## 2016-01-26 NOTE — Assessment & Plan Note (Signed)
No problem, continue Colace 200mg daily.  

## 2016-01-26 NOTE — Assessment & Plan Note (Signed)
Tylenol prn available to her. Off Cymbalta-hallucination. Off Fentanyl per patient's request.  

## 2016-01-26 NOTE — Progress Notes (Signed)
Patient ID: [name], female   DOB: 06-Jan-1911, 82 y.o.   MRN: [id number]  Location:  [facility] Provider:  Marlana Latus NP  Code Status:  DNR Goals of care: Advanced Directive information Does patient have an advance directive?: Yes, Type of Advance Directive: [place];Out of facility DNR (pink MOST or yellow form)  Chief Complaint  Patient presents with  . Medical Management of Chronic Issues    Routine Visit     HPI: Patient is a 82 y.o. female seen in the SNF at [facility] today for evaluation of Hx of thyroid, taking Levothyroxine 126mg, last TSH 0.79 09/30/15. Chronic pain back, legs, well controlled, off Fentanyl 257m/hr, blood pressure is controlled with Furosemide 2046maily.   Review of Systems:  Review of Systems  Constitutional: Negative for fever, chills and diaphoresis.  HENT: Positive for hearing loss (severe, f/u audiology, recently tube placed in the left ear drum,  hears better after impacted ceruen removed wtih ear lavage. ). Negative for congestion, ear discharge, ear pain and sore throat.   Eyes: Negative for photophobia, pain, discharge and redness.  Respiratory: Positive for cough. Negative for shortness of breath.   Cardiovascular: Positive for leg swelling (chronic. The patient stated its better than prior. ). Negative for chest pain and palpitations.       Chronic trace edema in ankles and stasis dermatitis, erythema, scaly, and painful ankles and feet. R>L  Gastrointestinal: Negative for nausea, vomiting, abdominal pain, diarrhea, constipation and blood in stool.  Genitourinary: Positive for frequency. Negative for dysuria, urgency, hematuria and flank pain.  Musculoskeletal: Positive for back pain. Negative for myalgias and neck pain.       Hand cram--warm water bottle helps at night.   Skin: Negative for rash.       Facial and right knee contusion.   Neurological: Negative for dizziness, tremors, seizures and weakness.   Endo/Heme/Allergies: Negative for environmental allergies and polydipsia. Does not bruise/bleed easily.  Psychiatric/Behavioral: Negative for hallucinations. The patient is not nervous/anxious.        Resolved visual hallucination after Cymbalta discontinued.    Past Medical History  Diagnosis Date  . Arthritis   . CHF (congestive heart failure) (HCCAlexander . Hypothyroidism   . HTN (hypertension), benign   . H/O: CVA (cerebrovascular accident)   . COPD (chronic obstructive pulmonary disease) (HCCRober[place] . Unspecified hearing loss 02/08/2013  . Unspecified constipation 11/02/2012  . Candidiasis of other urogenital sites 08/10/2012  . Insomnia, unspecified 08/10/2012  . Pain in joint, ankle and foot 06/14/2012  . Other and unspecified hyperlipidemia 06/01/2012  . Contusion of face, scalp, and neck except eye(s) 06/01/2012  . Contusion of wrist 06/01/2012  . Edema 04/20/2012  . Open wound of knee, leg (except thigh), and ankle, without mention of complication 5/95/01/8412 Closed fracture of sacrum and coccyx without mention of spinal cord injury 02/14/2012  . Unspecified hereditary and idiopathic peripheral neuropathy 01/27/2012  . Peripheral vascular disease, unspecified ([place]CC[place]/14/2013  . Other disorder of coccyx 01/27/2012  . Personal history of fall 01/27/2012  . Ventricular fibrillation (HCC[place]/10/2012  . Acute kidney failure, unspecified ([place]CC[place]/10/2012  . Cerebral embolism 05/23/2005  . Pneumonia, organism unspecified 01/23/2005  . COPD, mild ([place]CC[place]/07/2013  . History of cancer of uterus   . Abnormal liver function tests     11/11/15 AST 31, ALT 88, alk phos 96   . Arthralgia 09/26/2014    Multiple joints:  knees, shoulders, wrists, spine hips   . Cholelithiasis 11/04/2015  . Constipation 05/03/2013  . Hearing loss 09/26/2014  . Rheumatic fever 09/26/1933    Age 75 10/01/14 ESR 13, RAF <10   . Seasonal allergies 05/03/2013  . Stasis dermatitis of both legs 09/26/2014  . Urinary frequency  02/24/2014  . Hyperglycemia 11/14/2015    Patient Active Problem List   Diagnosis Date Noted  . Compression fracture of lumbar vertebra ([place]) 11/14/2015  . Hyperglycemia 11/14/2015  . Cholelithiasis 11/04/2015  . Abnormal liver function tests   . Gait instability 09/26/2014  . Arthralgia 09/26/2014  . Stasis dermatitis of both legs 09/26/2014  . Hearing loss 09/26/2014  . Urinary frequency 02/24/2014  . Edema 08/09/2013  . Corn of toe 08/09/2013  . Constipation 05/03/2013  . Seasonal allergies 05/03/2013  . Neuropathic pain of both legs 05/03/2013  . Cramping of hands 05/03/2013  . COPD, mild ([place]) 01/20/2013  . HTN (hypertension) 01/20/2013  . Hypothyroidism 01/20/2013  . Rheumatic fever 09/26/1933    Allergies  Allergen Reactions  . Amoxicillin     Unknown, patient unable to answer questionnaire   . Avelox [Moxifloxacin]     Unknown: listed on MAR  . Erythromycin     Unknown: listed on MAR  . Monistat [Miconazole]     Unknown: listed on MAR  . Morphine And Related     Unknown: listed on MAR  . Orange Juice [Orange Oil]     Unknown: listed on MAR    Medications: Patient's Medications  New Prescriptions   No medications on file  Previous Medications   ACETAMINOPHEN (TYLENOL) 325 MG TABLET    Take 650 mg by mouth every 4 (four) hours as needed for pain. Pain/headache   ALUM & MAG HYDROXIDE-SIMETH (MAALOX/MYLANTA) 200-200-20 MG/5ML SUSPENSION    Take 30 mLs by mouth every 6 (six) hours as needed for indigestion or heartburn.   ASPIRIN 81 MG CHEWABLE TABLET    Chew 81 mg by mouth daily.   B COMPLEX-C (B-COMPLEX WITH VITAMIN C) TABLET    Take 1 tablet by mouth daily.   CALCIUM CITRATE-VITAMIN D (CITRACAL+D) 315-200 MG-UNIT PER TABLET    Take 1 tablet by mouth daily.   DOCUSATE SODIUM (COLACE) 100 MG CAPSULE    Take 200 mg by mouth daily.    FEXOFENADINE (ALLEGRA) 60 MG TABLET    Take 60 mg by mouth daily.   FUROSEMIDE (LASIX) 20 MG TABLET    Take 1 tablet by mouth  daily.   LEVOTHYROXINE (SYNTHROID, LEVOTHROID) 150 MCG TABLET    Take 1 tablet by mouth daily.   MAGNESIUM HYDROXIDE (MILK OF MAGNESIA) 400 MG/5ML SUSPENSION    Take 30 mLs by mouth daily as needed for mild constipation.   MULTIPLE VITAMINS-MINERALS (EQL CENTURY MATURE) TABS    Take 1 tablet by mouth daily.    POTASSIUM CHLORIDE (K-DUR,KLOR-CON) 10 MEQ TABLET    Take 1 tablet by mouth daily.  Modified Medications   No medications on file  Discontinued Medications   FENTANYL (DURAGESIC - DOSED MCG/HR) 25 MCG/HR PATCH    Apply one patch topically every 3 days. Remove old patch before applying new patch    Physical Exam: Filed Vitals:   01/22/16 1648  BP: 162/88  Pulse: 78  Temp: 98.6 F (37 C)  TempSrc: Oral  Resp: 22   There is no weight on file to calculate BMI.  Physical Exam  Constitutional: She is oriented to person, place, and time. She  appears well-developed and well-nourished. No distress.  HENT:  Head: Normocephalic and atraumatic.  Right Ear: External ear normal.  Left Ear: External ear normal.  Nose: Nose normal.  Mouth/Throat: Oropharynx is clear and moist. No oropharyngeal exudate.  Left TM tube seen.   Eyes: Conjunctivae and EOM are normal. Pupils are equal, round, and reactive to light. Right eye exhibits no discharge. Left eye exhibits no discharge.  Neck: Normal range of motion. Neck supple. No JVD present. No thyromegaly present.  Cardiovascular: Exam reveals no gallop and no friction rub.   No murmur heard. Pulmonary/Chest: Effort normal. No respiratory distress. She has rales in the right lower field and the left lower field. She exhibits no tenderness.  Abdominal: Soft. Bowel sounds are normal. She exhibits no distension and no mass. There is no tenderness. There is no guarding. No hernia.  Musculoskeletal: Normal range of motion. She exhibits edema (pedal and ankles. ) and tenderness (c/o lower back and knees pain. ).  Chronic trace edema in ankles and  stasis dermatitis, erythema, scaly, and painful ankles and feet. R>L  Lymphadenopathy:    She has no cervical adenopathy.  Neurological: She is alert and oriented to person, place, and time. She displays normal reflexes. No cranial nerve deficit. Coordination normal.  Skin: Skin is warm and dry. No rash noted. She is not diaphoretic. No erythema. No pallor.  chronic edema in ankles and stasis dermatitis, erythema, scaly, and painful ankles and feet. R>L  Psychiatric: She has a normal mood and affect. Her behavior is normal. Judgment and thought content normal.  Some confusion    Labs reviewed: Basic Metabolic Panel:  Recent Labs  11/04/15 0140 11/06/15 0545 11/19/15  NA 137 140 136*  K 3.9 3.7 4.7  CL 101 106  --   CO2 28 26  --   GLUCOSE 120* 81  --   BUN 28* 17 22*  CREATININE 0.77 0.72 0.6  CALCIUM 8.9 8.8*  --     Liver Function Tests:  Recent Labs  11/04/15 0140 11/05/15 0530 11/06/15 0545 11/11/15 11/19/15  AST 171* 118* 89* 31 29  ALT 433* 298* 230* 88* 27  ALKPHOS 177* 144* 126 96 69  BILITOT 1.7* 1.1 1.3*  --   --   PROT 6.5 5.8* 5.9*  --   --   ALBUMIN 3.4* 3.2* 3.1*  --   --     CBC:  Recent Labs  11/04/15 0015  WBC 7.9  HGB 15.1*  HCT 46.1*  MCV 92.6  PLT 177    Lab Results  Component Value Date   TSH 0.712 11/04/2015   No results found for: HGBA1C No results found for: CHOL, HDL, LDLCALC, LDLDIRECT, TRIG, CHOLHDL  Significant Diagnostic Results since last visit: none  Patient Care Team: [name], MD as PCP - General (Internal Medicine) [name], MD (Internal Medicine) [place] Raju Coppolino X, NP as Nurse Practitioner (Nurse Practitioner) [place] Dawsyn Zurn X, NP as Nurse Practitioner (Nurse Practitioner)  Assessment/Plan Problem List Items Addressed This Visit    COPD, mild ([place]) - Primary (Chronic)    05/26/15 CXR no active cardiopulmonary disease suspected.  Stable.       HTN (hypertension)  (Chronic)    Mildly elevated sbp-150s-permissive blood pressure control. Continue Furosemide 78m      Hypothyroidism (Chronic)    Last TSH 0.786 09/30/15, continueLevothyroxine 1563m daily, update TSH      Edema (Chronic)    Minimal edema  in ankles, continue Furosemide 18m daily      Arthralgia (Chronic)    Off Cymbalta-associated with hallucination. Off Fentanyl.  Prn Tylenol available to her.       Constipation    No problem, continue Colace 2024mdaily.       Neuropathic pain of both legs    Tylenol prn available to her. Off Cymbalta-hallucination. Off Fentanyl per patient's request.           Family/ staff Communication: continue AL for care needs.   Labs/tests ordered: TSH  ManXie Arena Lindahl NP Geriatrics [facility] [address] [address] Call:  [phone] follow prompts after 5pm & weekends Office Phone:  [phone] Fax:  [phone]

## 2016-01-26 NOTE — Assessment & Plan Note (Signed)
Off Cymbalta-associated with hallucination. Off Fentanyl.  Prn Tylenol available to her.

## 2016-01-26 NOTE — Assessment & Plan Note (Signed)
05/26/15 CXR no active cardiopulmonary disease suspected.  Stable.  

## 2016-01-26 NOTE — Assessment & Plan Note (Signed)
Mildly elevated sbp-150s-permissive blood pressure control. Continue Furosemide 20mg.  

## 2016-01-26 NOTE — Assessment & Plan Note (Signed)
Minimal edema in ankles, continue Furosemide 20mg daily 

## 2016-01-27 LAB — TSH: TSH: 0.52 u[IU]/mL (ref 0.41–5.90)

## 2016-02-19 ENCOUNTER — Encounter: Payer: Self-pay | Admitting: Nurse Practitioner

## 2016-02-19 ENCOUNTER — Non-Acute Institutional Stay: Payer: 59 | Admitting: Nurse Practitioner

## 2016-02-19 DIAGNOSIS — K5900 Constipation, unspecified: Secondary | ICD-10-CM | POA: Diagnosis not present

## 2016-02-19 DIAGNOSIS — E039 Hypothyroidism, unspecified: Secondary | ICD-10-CM | POA: Diagnosis not present

## 2016-02-19 DIAGNOSIS — G5793 Unspecified mononeuropathy of bilateral lower limbs: Secondary | ICD-10-CM

## 2016-02-19 DIAGNOSIS — G5791 Unspecified mononeuropathy of right lower limb: Secondary | ICD-10-CM | POA: Diagnosis not present

## 2016-02-19 DIAGNOSIS — R609 Edema, unspecified: Secondary | ICD-10-CM

## 2016-02-19 DIAGNOSIS — I10 Essential (primary) hypertension: Secondary | ICD-10-CM | POA: Diagnosis not present

## 2016-02-19 DIAGNOSIS — G5792 Unspecified mononeuropathy of left lower limb: Secondary | ICD-10-CM

## 2016-02-19 DIAGNOSIS — J449 Chronic obstructive pulmonary disease, unspecified: Secondary | ICD-10-CM | POA: Diagnosis not present

## 2016-02-19 DIAGNOSIS — M2550 Pain in unspecified joint: Secondary | ICD-10-CM

## 2016-02-20 NOTE — Assessment & Plan Note (Signed)
Off Cymbalta-associated with hallucination. Off Fentanyl.  Prn Tylenol available to her.

## 2016-02-20 NOTE — Assessment & Plan Note (Signed)
Tylenol prn available to her. Off Cymbalta-hallucination. Off Fentanyl per patient's request.  

## 2016-02-20 NOTE — Assessment & Plan Note (Signed)
permissive blood pressure control. Continue Furosemide 20mg 

## 2016-02-20 NOTE — Assessment & Plan Note (Signed)
Minimal edema in ankles, continue Furosemide 20mg daily 

## 2016-02-20 NOTE — Assessment & Plan Note (Signed)
05/26/15 CXR no active cardiopulmonary disease suspected.  Stable.  

## 2016-02-20 NOTE — Assessment & Plan Note (Signed)
No problem, continue Colace 200mg daily.  

## 2016-02-20 NOTE — Assessment & Plan Note (Signed)
Last TSH 0.786 09/30/15, 01/27/16 TSH 0.52  continueLevothyroxine 150mcg daily,

## 2016-02-20 NOTE — Progress Notes (Signed)
Patient ID: [name], female   DOB: 11/02/1911, 82 y.o.   MRN: [id number]  Location:  [place] Room Number: 124 Place of Service: [facility] Provider:  Lennie Odor Ogden Handlin NP  GREEN, Viviann Spare, MD  Patient Care Team: [name], MD as PCP - General (Internal Medicine) [name], MD (Internal Medicine) [place] Eira Alpert Rhunette Croft, NP as Nurse Practitioner (Nurse Practitioner) [place] Reann Dobias Rhunette Croft, NP as Nurse Practitioner (Nurse Practitioner)  Extended Emergency Contact Information Primary Emergency Contact: [name] States of [place] Phone: [phone] Relation: Son  Code Status:  DNR Goals of care: Advanced Directive information Advanced Directives 02/19/2016  Does patient have an advance directive? Yes  Type of Paramedic of [place];Out of facility DNR (pink MOST or yellow form)  Does patient want to make changes to advanced directive? No - Patient declined  Copy of advanced directive(s) in chart? Yes     Chief Complaint  Patient presents with  . Medical Management of Chronic Issues    Routine Visit    HPI:  Pt is a 82 y.o. female seen today for medical management of chronic diseases.  Hx of thyroid, taking Levothyroxine 153mg, last TSH 0.79 09/30/15. Chronic pain back, legs, well controlled, off Fentanyl 262m/hr, blood pressure is controlled with Furosemide 2023maily.      Past Medical History  Diagnosis Date  . Arthritis   . CHF (congestive heart failure) ([facility]CC[facility] . Hypothyroidism   . HTN (hypertension), benign   . H/O: CVA (cerebrovascular accident)   . COPD (chronic obstructive pulmonary disease) (HCCC[place] . Unspecified hearing loss 02/08/2013  . Unspecified constipation 11/02/2012  . Candidiasis of other urogenital sites 08/10/2012  . Insomnia, unspecified 08/10/2012  . Pain in joint, ankle and foot 06/14/2012  . Other and unspecified hyperlipidemia 06/01/2012  .  Contusion of face, scalp, and neck except eye(s) 06/01/2012  . Contusion of wrist 06/01/2012  . Edema 04/20/2012  . Open wound of knee, leg (except thigh), and ankle, without mention of complication 5/95/0/5397 Closed fracture of sacrum and coccyx without mention of spinal cord injury 02/14/2012  . Unspecified hereditary and idiopathic peripheral neuropathy 01/27/2012  . Peripheral vascular disease, unspecified (HCC[place]/14/2013  . Other disorder of coccyx 01/27/2012  . Personal history of fall 01/27/2012  . Ventricular fibrillation ([facility]CC[place]/10/2012  . Acute kidney failure, unspecified (HCC[place]/10/2012  . Cerebral embolism 05/23/2005  . Pneumonia, organism unspecified 01/23/2005  . COPD, mild (HCC[place]/07/2013  . History of cancer of uterus   . Abnormal liver function tests     11/11/15 AST 31, ALT 88, alk phos 96   . Arthralgia 09/26/2014    Multiple joints: knees, shoulders, wrists, spine hips   . Cholelithiasis 11/04/2015  . Constipation 05/03/2013  . Hearing loss 09/26/2014  . Rheumatic fever 09/26/1933    Age 76 10/01/14 ESR 13, RAF <10   . Seasonal allergies 05/03/2013  . Stasis dermatitis of both legs 09/26/2014  . Urinary frequency 02/24/2014  . Hyperglycemia 11/14/2015   Past Surgical History  Procedure Laterality Date  . Abdominal hysterectomy  1990    for endometrial cancer  . Tonsillectomy  1916  . Mastoidectomy  1920    bilateral  . Gum surgery  1932  . Ectopic pregnancy surgery  1952  . Thyroidectomy  1960  . Cataract extraction w/ intraocular lens  implant, bilateral  Allergies  Allergen Reactions  . Amoxicillin     Unknown, patient unable to answer questionnaire   . Avelox [Moxifloxacin]     Unknown: listed on MAR  . Erythromycin     Unknown: listed on MAR  . Monistat [Miconazole]     Unknown: listed on MAR  . Morphine And Related     Unknown: listed on MAR  . Orange Juice [Orange Oil]     Unknown: listed on MAR      Medication List       This list is  accurate as of: 02/19/16 11:59 PM.  Always use your most recent med list.               acetaminophen 325 MG tablet  Commonly known as:  TYLENOL  Take 650 mg by mouth every 4 (four) hours as needed for pain. Pain/headache     alum & mag hydroxide-simeth 200-200-20 MG/5ML suspension  Commonly known as:  MAALOX/MYLANTA  Take 30 mLs by mouth every 6 (six) hours as needed for indigestion or heartburn.     aspirin 81 MG chewable tablet  Chew 81 mg by mouth daily.     B-complex with vitamin C tablet  Take 1 tablet by mouth daily.     calcium citrate-vitamin D 315-200 MG-UNIT tablet  Commonly known as:  CITRACAL+D  Take 1 tablet by mouth daily.     docusate sodium 100 MG capsule  Commonly known as:  COLACE  Take 200 mg by mouth daily.     EQL CENTURY MATURE Tabs  Take 1 tablet by mouth daily.     fexofenadine 60 MG tablet  Commonly known as:  ALLEGRA  Take 60 mg by mouth daily.     furosemide 20 MG tablet  Commonly known as:  LASIX  Take 1 tablet by mouth daily.     levothyroxine 150 MCG tablet  Commonly known as:  SYNTHROID, LEVOTHROID  Take 1 tablet by mouth daily.     MILK OF MAGNESIA PO  Take 30 mLs by mouth daily as needed.     potassium chloride 10 MEQ tablet  Commonly known as:  K-DUR,KLOR-CON  Take 1 tablet by mouth daily.        Review of Systems  Constitutional: Negative for fever, chills and diaphoresis.  HENT: Positive for hearing loss (severe, f/u audiology, recently tube placed in the left ear drum,  hears better after impacted ceruen removed wtih ear lavage. ). Negative for congestion, ear discharge, ear pain and sore throat.   Eyes: Negative for photophobia, pain, discharge and redness.  Respiratory: Positive for cough. Negative for shortness of breath.   Cardiovascular: Positive for leg swelling (chronic. The patient stated its better than prior. ). Negative for chest pain and palpitations.       Chronic trace edema in ankles and stasis dermatitis,  erythema, scaly, and painful ankles and feet. R>L  Gastrointestinal: Negative for nausea, vomiting, abdominal pain, diarrhea, constipation and blood in stool.  Endocrine: Negative for polydipsia.  Genitourinary: Positive for frequency. Negative for dysuria, urgency, hematuria and flank pain.  Musculoskeletal: Positive for back pain. Negative for myalgias and neck pain.       Hand cram--warm water bottle helps at night.   Skin: Negative for rash.       Facial and right knee contusion.   Allergic/Immunologic: Negative for environmental allergies.  Neurological: Negative for dizziness, tremors, seizures and weakness.  Hematological: Does not bruise/bleed easily.  Psychiatric/Behavioral: Negative for hallucinations.  The patient is not nervous/anxious.        Resolved visual hallucination after Cymbalta discontinued.    Immunization History  Administered Date(s) Administered  . Influenza Whole 10/19/2012  . Influenza-Unspecified 10/18/2013, 10/17/2014, 09/02/2015  . PPD Test 01/22/2013  . Pneumococcal Polysaccharide-23 01/22/2013  . Td 02/21/2009  . Tdap 04/07/2015   Pertinent  Health Maintenance Due  Topic Date Due  . DEXA SCAN  03/25/1978  . PNA vac Low Risk Adult (2 of 2 - PCV13) 01/22/2014  . INFLUENZA VACCINE  07/13/2016   Fall Risk  11/14/2015 04/17/2015 09/26/2014 09/26/2014  Falls in the past year? No Yes No Yes  Number falls in past yr: - 1 - 2 or more  Injury with Fall? - Yes - -  Risk Factor Category  - High Fall Risk - -  Risk for fall due to : Impaired mobility;Impaired vision - - -   Functional Status Survey:    Filed Vitals:   02/19/16 1110  BP: 155/77  Pulse: 79  Temp: 97.3 F (36.3 C)  TempSrc: Oral  Resp: 20  Height: 5' (1.524 m)  Weight: 143 lb 9.6 oz (65.137 kg)   Body mass index is 28.05 kg/(m^2). Physical Exam  Constitutional: She is oriented to person, place, and time. She appears well-developed and well-nourished. No distress.  HENT:  Head:  Normocephalic and atraumatic.  Right Ear: External ear normal.  Left Ear: External ear normal.  Nose: Nose normal.  Mouth/Throat: Oropharynx is clear and moist. No oropharyngeal exudate.  Left TM tube seen.   Eyes: Conjunctivae and EOM are normal. Pupils are equal, round, and reactive to light. Right eye exhibits no discharge. Left eye exhibits no discharge.  Neck: Normal range of motion. Neck supple. No JVD present. No thyromegaly present.  Cardiovascular: Exam reveals no gallop and no friction rub.   No murmur heard. Pulmonary/Chest: Effort normal. No respiratory distress. She has rales in the right lower field and the left lower field. She exhibits no tenderness.  Abdominal: Soft. Bowel sounds are normal. She exhibits no distension and no mass. There is no tenderness. There is no guarding. No hernia.  Musculoskeletal: Normal range of motion. She exhibits edema (pedal and ankles. ) and tenderness (c/o lower back and knees pain. ).  Chronic trace edema in ankles and stasis dermatitis, erythema, scaly, and painful ankles and feet. R>L  Lymphadenopathy:    She has no cervical adenopathy.  Neurological: She is alert and oriented to person, place, and time. She displays normal reflexes. No cranial nerve deficit. Coordination normal.  Skin: Skin is warm and dry. No rash noted. She is not diaphoretic. No erythema. No pallor.  chronic edema in ankles and stasis dermatitis, erythema, scaly, and painful ankles and feet. R>L  Psychiatric: She has a normal mood and affect. Her behavior is normal. Judgment and thought content normal.  Some confusion    Labs reviewed:  Recent Labs  11/04/15 0140 11/06/15 0545 11/19/15  NA 137 140 136*  K 3.9 3.7 4.7  CL 101 106  --   CO2 28 26  --   GLUCOSE 120* 81  --   BUN 28* 17 22*  CREATININE 0.77 0.72 0.6  CALCIUM 8.9 8.8*  --     Recent Labs  11/04/15 0140 11/05/15 0530 11/06/15 0545 11/11/15 11/19/15  AST 171* 118* 89* 31 29  ALT 433* 298*  230* 88* 27  ALKPHOS 177* 144* 126 96 69  BILITOT 1.7* 1.1 1.3*  --   --  PROT 6.5 5.8* 5.9*  --   --   ALBUMIN 3.4* 3.2* 3.1*  --   --     Recent Labs  11/04/15 0015  WBC 7.9  HGB 15.1*  HCT 46.1*  MCV 92.6  PLT 177   Lab Results  Component Value Date   TSH 0.52 01/27/2016   No results found for: HGBA1C No results found for: CHOL, HDL, LDLCALC, LDLDIRECT, TRIG, CHOLHDL  Significant Diagnostic Results in last 30 days:  No results found.  Assessment/Plan  Arthralgia Off Cymbalta-associated with hallucination. Off Fentanyl.  Prn Tylenol available to her.   Constipation No problem, continue Colace 263m daily.   COPD, mild ([facility] 05/26/15 CXR no active cardiopulmonary disease suspected.  Stable.   Edema Minimal edema in ankles, continue Furosemide 282mdaily  HTN (hypertension) permissive blood pressure control. Continue Furosemide 2018mNeuropathic pain of both legs Tylenol prn available to her. Off Cymbalta-hallucination. Off Fentanyl per patient's request.   Hypothyroidism Last TSH 0.786 09/30/15, 01/27/16 TSH 0.52  continueLevothyroxine 150m54maily,    Family/ staff Communication: continue [facility] for care needs.   Labs/tests ordered: none

## 2016-02-20 NOTE — Assessment & Plan Note (Deleted)
05/26/15 CXR no active cardiopulmonary disease suspected.  Stable.  

## 2016-04-29 ENCOUNTER — Encounter: Payer: Self-pay | Admitting: Nurse Practitioner

## 2016-04-29 ENCOUNTER — Non-Acute Institutional Stay: Payer: 59 | Admitting: Nurse Practitioner

## 2016-04-29 DIAGNOSIS — R609 Edema, unspecified: Secondary | ICD-10-CM

## 2016-04-29 DIAGNOSIS — J449 Chronic obstructive pulmonary disease, unspecified: Secondary | ICD-10-CM | POA: Diagnosis not present

## 2016-04-29 DIAGNOSIS — E039 Hypothyroidism, unspecified: Secondary | ICD-10-CM

## 2016-04-29 DIAGNOSIS — I10 Essential (primary) hypertension: Secondary | ICD-10-CM | POA: Diagnosis not present

## 2016-04-29 DIAGNOSIS — M2550 Pain in unspecified joint: Secondary | ICD-10-CM

## 2016-04-29 DIAGNOSIS — K5900 Constipation, unspecified: Secondary | ICD-10-CM | POA: Diagnosis not present

## 2016-04-29 NOTE — Assessment & Plan Note (Signed)
Off Cymbalta-associated with hallucination. Off Fentanyl.  Prn Tylenol available to her.

## 2016-04-29 NOTE — Assessment & Plan Note (Signed)
Minimal edema in ankles, continue Furosemide 20mg  daily, update CMP

## 2016-04-29 NOTE — Assessment & Plan Note (Signed)
Last TSH 0.786 09/30/15, 01/27/16 TSH 0.52  continueLevothyroxine 150mcg daily, update TSH

## 2016-04-29 NOTE — Assessment & Plan Note (Signed)
permissive blood pressure control. Continue Furosemide 20mg , update CMP

## 2016-04-29 NOTE — Assessment & Plan Note (Signed)
05/26/15 CXR no active cardiopulmonary disease suspected.  Stable. Update CBC

## 2016-04-29 NOTE — Assessment & Plan Note (Signed)
No problem, continue Colace 200mg daily.  

## 2016-04-29 NOTE — Progress Notes (Signed)
Patient ID: [name], female   DOB: 1911-02-14, 82 y.o.   MRN: [id number]  Location:  [place] Room Number: 016 Place of Service: [facility] Provider:  Lennie Odor Jhace Fennell NP  Norman, Tracie Spare, MD  Patient Care Team: [name], MD as PCP - General (Internal Medicine) [name], MD (Internal Medicine) [place] Tracie Micheli Rhunette Croft, NP as Nurse Practitioner (Nurse Practitioner) [place] Tracie Padovano Rhunette Croft, NP as Nurse Practitioner (Nurse Practitioner)  Extended Emergency Contact Information Primary Emergency Contact: [name] States of [place] Phone: [phone] Relation: Son  Code Status:  DNR Goals of care: Advanced Directive information Advanced Directives 04/29/2016  Does patient have an advance directive? Yes  Type of Paramedic of [place];Living will;Out of facility DNR (pink MOST or yellow form)  Does patient want to make changes to advanced directive? No - Patient declined  Copy of advanced directive(s) in chart? Yes     Chief Complaint  Patient presents with  . Medical Management of Chronic Issues    Routine Visit    HPI:  Pt is a 82 y.o. female seen today for medical management of chronic diseases.  Hx of thyroid, taking Levothyroxine 156mg, last TSH 0.79 09/30/15. Chronic pain back, legs, well controlled, off Fentanyl 241m/hr, blood pressure is controlled with Furosemide 2053maily.   Past Medical History  Diagnosis Date  . Arthritis   . CHF (congestive heart failure) (HCCJul[facility] . Hypothyroidism   . HTN (hypertension), benign   . H/O: CVA (cerebrovascular accident)   . COPD (chronic obstructive pulmonary disease) (HCCRed[facility] . Unspecified hearing loss 02/08/2013  . Unspecified constipation 11/02/2012  . Candidiasis of other urogenital sites 08/10/2012  . Insomnia, unspecified 08/10/2012  . Pain in joint, ankle and foot 06/14/2012  . Other and unspecified hyperlipidemia 06/01/2012  .  Contusion of face, scalp, and neck except eye(s) 06/01/2012  . Contusion of wrist 06/01/2012  . Edema 04/20/2012  . Open wound of knee, leg (except thigh), and ankle, without mention of complication 5/98/05/7543 Closed fracture of sacrum and coccyx without mention of spinal cord injury 02/14/2012  . Unspecified hereditary and idiopathic peripheral neuropathy 01/27/2012  . Peripheral vascular disease, unspecified (HCCLewis[place]/14/2013  . Other disorder of coccyx 01/27/2012  . Personal history of fall 01/27/2012  . Ventricular fibrillation (HCC[place]/10/2012  . Acute kidney failure, unspecified (HCCWerners[place]/10/2012  . Cerebral embolism 05/23/2005  . Pneumonia, organism unspecified 01/23/2005  . COPD, mild (HCCSan[place]/07/2013  . History of cancer of uterus   . Abnormal liver function tests     11/11/15 AST 31, ALT 88, alk phos 96   . Arthralgia 09/26/2014    Multiple joints: knees, shoulders, wrists, spine hips   . Cholelithiasis 11/04/2015  . Constipation 05/03/2013  . Hearing loss 09/26/2014  . Rheumatic fever 09/26/1933    Age 24 10/01/14 ESR 13, RAF <10   . Seasonal allergies 05/03/2013  . Stasis dermatitis of both legs 09/26/2014  . Urinary frequency 02/24/2014  . Hyperglycemia 11/14/2015   Past Surgical History  Procedure Laterality Date  . Abdominal hysterectomy  1990    for endometrial cancer  . Tonsillectomy  1916  . Mastoidectomy  1920    bilateral  . Gum surgery  1932  . Ectopic pregnancy surgery  1952  . Thyroidectomy  1960  . Cataract extraction w/ intraocular lens  implant, bilateral      Allergies  Allergen Reactions  . Amoxicillin     Unknown, patient unable to answer questionnaire   . Avelox [Moxifloxacin]     Unknown: listed on MAR  . Erythromycin     Unknown: listed on MAR  . Monistat [Miconazole]     Unknown: listed on MAR  . Morphine And Related     Unknown: listed on MAR  . Orange Juice [Orange Oil]     Unknown: listed on MAR      Medication List       This list is  accurate as of: 04/29/16  1:13 PM.  Always use your most recent med list.               acetaminophen 325 MG tablet  Commonly known as:  TYLENOL  Take 650 mg by mouth every 4 (four) hours as needed for pain. Pain/headache     alum & mag hydroxide-simeth 200-200-20 MG/5ML suspension  Commonly known as:  MAALOX/MYLANTA  Take 30 mLs by mouth every 6 (six) hours as needed for indigestion or heartburn.     aspirin 81 MG chewable tablet  Chew 81 mg by mouth daily.     B-complex with vitamin C tablet  Take 1 tablet by mouth daily.     calcium citrate-vitamin D 315-200 MG-UNIT tablet  Commonly known as:  CITRACAL+D  Take 1 tablet by mouth daily.     docusate sodium 100 MG capsule  Commonly known as:  COLACE  Take 200 mg by mouth daily.     EQL CENTURY MATURE Tabs  Take 1 tablet by mouth daily.     fexofenadine 60 MG tablet  Commonly known as:  ALLEGRA  Take 60 mg by mouth daily.     furosemide 20 MG tablet  Commonly known as:  LASIX  Take 1 tablet by mouth daily.     levothyroxine 150 MCG tablet  Commonly known as:  SYNTHROID, LEVOTHROID  Take 1 tablet by mouth daily.     MILK OF MAGNESIA PO  Take 30 mLs by mouth daily as needed.     potassium chloride 10 MEQ tablet  Commonly known as:  K-DUR,KLOR-CON  Take 1 tablet by mouth daily.        Review of Systems  Constitutional: Negative for fever, chills and diaphoresis.  HENT: Positive for hearing loss (severe, f/u audiology, recently tube placed in the left ear drum,  hears better after impacted ceruen removed wtih ear lavage. ). Negative for congestion, ear discharge, ear pain and sore throat.   Eyes: Negative for photophobia, pain, discharge and redness.  Respiratory: Positive for cough. Negative for shortness of breath.   Cardiovascular: Positive for leg swelling (chronic. The patient stated its better than prior. ). Negative for chest pain and palpitations.       Chronic trace edema in ankles and stasis dermatitis,  erythema, scaly, and painful ankles and feet. R>L  Gastrointestinal: Negative for nausea, vomiting, abdominal pain, diarrhea, constipation and blood in stool.  Endocrine: Negative for polydipsia.  Genitourinary: Positive for frequency. Negative for dysuria, urgency, hematuria and flank pain.  Musculoskeletal: Positive for back pain. Negative for myalgias and neck pain.       Hand cram--warm water bottle helps at night.   Skin: Negative for rash.       Facial and right knee contusion.   Allergic/Immunologic: Negative for environmental allergies.  Neurological: Negative for dizziness, tremors, seizures and weakness.  Hematological: Does not bruise/bleed easily.  Psychiatric/Behavioral: Negative for hallucinations. The  patient is not nervous/anxious.        Resolved visual hallucination after Cymbalta discontinued.    Immunization History  Administered Date(s) Administered  . Influenza Whole 10/19/2012  . Influenza-Unspecified 10/18/2013, 10/17/2014, 09/02/2015  . PPD Test 01/22/2013, 11/28/2015  . Pneumococcal Polysaccharide-23 01/22/2013  . Td 02/21/2009  . Tdap 04/07/2015   Pertinent  Health Maintenance Due  Topic Date Due  . DEXA SCAN  03/25/1978  . PNA vac Low Risk Adult (2 of 2 - PCV13) 01/22/2014  . INFLUENZA VACCINE  07/13/2016   Fall Risk  11/14/2015 04/17/2015 09/26/2014 09/26/2014  Falls in the past year? No Yes No Yes  Number falls in past yr: - 1 - 2 or more  Injury with Fall? - Yes - -  Risk Factor Category  - High Fall Risk - -  Risk for fall due to : Impaired mobility;Impaired vision - - -   Functional Status Survey:    Filed Vitals:   04/29/16 1053  BP: 132/84  Pulse: 76  Temp: 97.1 F (36.2 C)  TempSrc: Oral  Resp: 18  Height: 5' (1.524 m)  Weight: 148 lb (67.132 kg)   Body mass index is 28.9 kg/(m^2). Physical Exam  Constitutional: She is oriented to person, place, and time. She appears well-developed and well-nourished. No distress.  HENT:  Head:  Normocephalic and atraumatic.  Right Ear: External ear normal.  Left Ear: External ear normal.  Nose: Nose normal.  Mouth/Throat: Oropharynx is clear and moist. No oropharyngeal exudate.  Left TM tube seen.   Eyes: Conjunctivae and EOM are normal. Pupils are equal, round, and reactive to light. Right eye exhibits no discharge. Left eye exhibits no discharge.  Neck: Normal range of motion. Neck supple. No JVD present. No thyromegaly present.  Cardiovascular: Exam reveals no gallop and no friction rub.   No murmur heard. Pulmonary/Chest: Effort normal. No respiratory distress. She has rales in the right lower field and the left lower field. She exhibits no tenderness.  Abdominal: Soft. Bowel sounds are normal. She exhibits no distension and no mass. There is no tenderness. There is no guarding. No hernia.  Musculoskeletal: Normal range of motion. She exhibits edema (pedal and ankles. ) and tenderness (c/o lower back and knees pain. ).  Chronic trace edema in ankles and stasis dermatitis, erythema, scaly, and painful ankles and feet. R>L  Lymphadenopathy:    She has no cervical adenopathy.  Neurological: She is alert and oriented to person, place, and time. She displays normal reflexes. No cranial nerve deficit. Coordination normal.  Skin: Skin is warm and dry. No rash noted. She is not diaphoretic. No erythema. No pallor.  chronic edema in ankles and stasis dermatitis, erythema, scaly, and painful ankles and feet. R>L  Psychiatric: She has a normal mood and affect. Her behavior is normal. Judgment and thought content normal.  Some confusion    Labs reviewed:  Recent Labs  11/04/15 0140 11/06/15 0545 11/19/15  NA 137 140 136*  K 3.9 3.7 4.7  CL 101 106  --   CO2 28 26  --   GLUCOSE 120* 81  --   BUN 28* 17 22*  CREATININE 0.77 0.72 0.6  CALCIUM 8.9 8.8*  --     Recent Labs  11/04/15 0140 11/05/15 0530 11/06/15 0545 11/11/15 11/19/15  AST 171* 118* 89* 31 29  ALT 433* 298*  230* 88* 27  ALKPHOS 177* 144* 126 96 69  BILITOT 1.7* 1.1 1.3*  --   --  PROT 6.5 5.8* 5.9*  --   --   ALBUMIN 3.4* 3.2* 3.1*  --   --     Recent Labs  11/04/15 0015  WBC 7.9  HGB 15.1*  HCT 46.1*  MCV 92.6  PLT 177   Lab Results  Component Value Date   TSH 0.52 01/27/2016   No results found for: HGBA1C No results found for: CHOL, HDL, LDLCALC, LDLDIRECT, TRIG, CHOLHDL  Significant Diagnostic Results in last 30 days:  No results found.  Assessment/Plan  Hypothyroidism Last TSH 0.786 09/30/15, 01/27/16 TSH 0.52  continueLevothyroxine 126mg daily, update TSH   HTN (hypertension) permissive blood pressure control. Continue Furosemide 233m update CMP   Edema Minimal edema in ankles, continue Furosemide 2036maily, update CMP   COPD, mild (HCCCalabas[place]/13/16 CXR no active cardiopulmonary disease suspected.  Stable. Update CBC   Constipation No problem, continue Colace 200m79mily.    Arthralgia Off Cymbalta-associated with hallucination. Off Fentanyl.  Prn Tylenol available to her.      Family/ staff Communication: continue AL for care needs.   Labs/tests ordered: CBC, CMP, TSH

## 2016-05-04 LAB — BASIC METABOLIC PANEL
BUN: 26 mg/dL — AB (ref 4–21)
CREATININE: 0.8 mg/dL (ref <=1.1)
Glucose: 81 mg/dL
POTASSIUM: 4.2 mmol/L (ref 3.4–5.3)
SODIUM: 141 mmol/L (ref 137–147)

## 2016-05-04 LAB — CBC AND DIFFERENTIAL
HCT: 43 % (ref 36–46)
Hemoglobin: 14.1 g/dL (ref 12.0–16.0)
Platelets: 157 10*3/uL (ref 150–399)
WBC: 6.1 10^3/mL

## 2016-05-04 LAB — HEPATIC FUNCTION PANEL
ALK PHOS: 54 U/L (ref 25–125)
ALT: 9 U/L (ref 7–35)
AST: 16 U/L (ref 13–35)
Bilirubin, Total: 0.5 mg/dL

## 2016-05-04 LAB — TSH: TSH: 0.64 u[IU]/mL (ref <=5.90)

## 2016-05-06 ENCOUNTER — Encounter: Payer: Self-pay | Admitting: *Deleted

## 2016-06-01 ENCOUNTER — Encounter: Payer: Self-pay | Admitting: Nurse Practitioner

## 2016-06-01 ENCOUNTER — Non-Acute Institutional Stay: Payer: 59 | Admitting: Nurse Practitioner

## 2016-06-01 DIAGNOSIS — I10 Essential (primary) hypertension: Secondary | ICD-10-CM

## 2016-06-01 DIAGNOSIS — L8992 Pressure ulcer of unspecified site, stage 2: Secondary | ICD-10-CM

## 2016-06-01 DIAGNOSIS — G5792 Unspecified mononeuropathy of left lower limb: Secondary | ICD-10-CM | POA: Diagnosis not present

## 2016-06-01 DIAGNOSIS — R609 Edema, unspecified: Secondary | ICD-10-CM

## 2016-06-01 DIAGNOSIS — G5793 Unspecified mononeuropathy of bilateral lower limbs: Secondary | ICD-10-CM

## 2016-06-01 DIAGNOSIS — K5900 Constipation, unspecified: Secondary | ICD-10-CM

## 2016-06-01 DIAGNOSIS — G5791 Unspecified mononeuropathy of right lower limb: Secondary | ICD-10-CM

## 2016-06-02 DIAGNOSIS — L8992 Pressure ulcer of unspecified site, stage 2: Secondary | ICD-10-CM | POA: Insufficient documentation

## 2016-06-02 DIAGNOSIS — L8990 Pressure ulcer of unspecified site, unspecified stage: Secondary | ICD-10-CM | POA: Insufficient documentation

## 2016-06-02 NOTE — Assessment & Plan Note (Signed)
Minimal edema in ankles, continue Furosemide 20mg  daily, 05/04/16 Na 141, K 4.2, Bun 26, creat 0.82

## 2016-06-02 NOTE — Assessment & Plan Note (Addendum)
permissive blood pressure control. Continue Furosemide 20mg, 05/04/16 Na 141, K 4.2, Bun 26, creat 0.82  

## 2016-06-02 NOTE — Assessment & Plan Note (Signed)
No problem, continue Colace 200mg daily.  

## 2016-06-02 NOTE — Assessment & Plan Note (Signed)
Tylenol prn available to her. Off Cymbalta-hallucination. Off Fentanyl per patient's request.  

## 2016-06-02 NOTE — Progress Notes (Signed)
Patient ID: [name], female   DOB: 03/20/1911, 82 y.o.   MRN: [id number] [facility] [place]  Nursing Home Room Number: 498 Place of Service: [place] Provider:  Lennie Odor Izacc Demeyer Norman  [name]  Patient Care Team: [name] as PCP - General (Internal Medicine) [name] (Internal Medicine) [name] as Nurse Practitioner (Nurse Practitioner) [name] as Nurse Practitioner (Nurse Practitioner)  Extended Emergency Contact Information Primary Emergency Contact: [name] States of [place] Phone: [phone] Relation: Son  Code Status:  DNR Goals of care: Advanced Directive information Advanced Directives 06/01/2016  Does patient have an advance directive? Yes  Type of Paramedic of [place];Living will;Out of facility DNR (pink MOST or yellow form)  Does patient want to make changes to advanced directive? No - Patient declined  Copy of advanced directive(s) in chart? Yes     Chief Complaint  Patient presents with  . Medical Management of Chronic Issues    HPI:  Pt is a 82 y.o. female seen today for medical management of chronic diseases.  Hx of thyroid, taking Levothyroxine 144mg, last TSH 0.79 09/30/15. Chronic pain back, legs, well controlled, off Fentanyl 267m/hr, blood pressure is controlled with Furosemide 2076maily.   Past Medical History  Diagnosis Date  . Arthritis   . CHF (congestive heart failure) ([place] . Hypothyroidism   . HTN (hypertension), benign   . H/O: CVA (cerebrovascular accident)   . COPD (chronic obstructive pulmonary disease) ([place] . Unspecified hearing loss 02/08/2013  . Unspecified constipation 11/02/2012  . Candidiasis of other urogenital sites 08/10/2012  . Insomnia, unspecified 08/10/2012  . Pain in joint, ankle and foot 06/14/2012  . Other and unspecified hyperlipidemia 06/01/2012  . Contusion of face, scalp, and neck except  eye(s) 06/01/2012  . Contusion of wrist 06/01/2012  . Edema 04/20/2012  . Open wound of knee, leg (except thigh), and ankle, without mention of complication 5/90/06/6807 Closed fracture of sacrum and coccyx without mention of spinal cord injury 02/14/2012  . Unspecified hereditary and idiopathic peripheral neuropathy 01/27/2012  . Peripheral vascular disease, unspecified ([place]CC[place]/14/2013  . Other disorder of coccyx 01/27/2012  . Personal history of fall 01/27/2012  . Ventricular fibrillation (HCCF[place]/10/2012  . Acute kidney failure, unspecified ([place]CC[place]/10/2012  . Cerebral embolism 05/23/2005  . Pneumonia, organism unspecified 01/23/2005  . COPD, mild (HC[place]/07/2013  . History of cancer of uterus   . Abnormal liver function tests     11/11/15 AST 31, ALT 88, alk phos 96   . Arthralgia 09/26/2014    Multiple joints: knees, shoulders, wrists, spine hips   . Cholelithiasis 11/04/2015  . Constipation 05/03/2013  . Hearing loss 09/26/2014  . Rheumatic fever 09/26/1933    Age 88 10/01/14 ESR 13, RAF <10   . Seasonal allergies 05/03/2013  . Stasis dermatitis of both legs 09/26/2014  . Urinary frequency 02/24/2014  . Hyperglycemia 11/14/2015   Past Surgical History  Procedure Laterality Date  . Abdominal hysterectomy  1990    for endometrial cancer  . Tonsillectomy  1916  . Mastoidectomy  1920    bilateral  . Gum surgery  1932  . Ectopic pregnancy surgery  1952  . Thyroidectomy  1960  . Cataract extraction w/ intraocular lens  implant, bilateral      Allergies  Allergen Reactions  . Amoxicillin  Unknown, patient unable to answer questionnaire   . Avelox [Moxifloxacin]     Unknown: listed on MAR  . Erythromycin     Unknown: listed on MAR  . Monistat [Miconazole]     Unknown: listed on MAR  . Morphine And Related     Unknown: listed on MAR  . Orange Juice [Orange Oil]     Unknown: listed on MAR      Medication List       This list is accurate as of: 06/01/16 11:59 PM.  Always use  your most recent med list.               acetaminophen 325 MG tablet  Commonly known as:  TYLENOL  Take 650 mg by mouth every 4 (four) hours as needed for pain. Pain/headache     alum & mag hydroxide-simeth 200-200-20 MG/5ML suspension  Commonly known as:  MAALOX/MYLANTA  Take 30 mLs by mouth every 6 (six) hours as needed for indigestion or heartburn.     aspirin 81 MG chewable tablet  Chew 81 mg by mouth daily.     B-complex with vitamin C tablet  Take 1 tablet by mouth daily.     calcium citrate-vitamin D 315-200 MG-UNIT tablet  Commonly known as:  CITRACAL+D  Take 1 tablet by mouth daily.     docusate sodium 100 MG capsule  Commonly known as:  COLACE  Take 200 mg by mouth daily.     EQL CENTURY MATURE Tabs  Take 1 tablet by mouth daily.     fexofenadine 60 MG tablet  Commonly known as:  ALLEGRA  Take 60 mg by mouth daily.     furosemide 20 MG tablet  Commonly known as:  LASIX  Take 1 tablet by mouth daily.     levothyroxine 150 MCG tablet  Commonly known as:  SYNTHROID, LEVOTHROID  Take 1 tablet by mouth daily.     MILK OF MAGNESIA PO  Take 30 mLs by mouth daily as needed.     potassium chloride 10 MEQ tablet  Commonly known as:  K-DUR,KLOR-CON  Take 1 tablet by mouth daily.        Review of Systems  Constitutional: Negative for fever, chills and diaphoresis.  HENT: Positive for hearing loss (severe, f/u audiology, recently tube placed in the left ear drum,  hears better after impacted ceruen removed wtih ear lavage. ). Negative for congestion, ear discharge, ear pain and sore throat.   Eyes: Negative for photophobia, pain, discharge and redness.  Respiratory: Positive for cough. Negative for shortness of breath.   Cardiovascular: Positive for leg swelling (chronic. The patient stated its better than prior. ). Negative for chest pain and palpitations.       Chronic trace edema in ankles and stasis dermatitis, erythema, scaly, and painful ankles and feet.  R>L  Gastrointestinal: Negative for nausea, vomiting, abdominal pain, diarrhea, constipation and blood in stool.  Endocrine: Negative for polydipsia.  Genitourinary: Positive for frequency. Negative for dysuria, urgency, hematuria and flank pain.  Musculoskeletal: Positive for back pain. Negative for myalgias and neck pain.       Hand cram--warm water bottle helps at night.   Skin: Negative for rash.       Sacral coccygeal area excoriated and superficial stage II pressure ulcers.   Allergic/Immunologic: Negative for environmental allergies.  Neurological: Negative for dizziness, tremors, seizures and weakness.  Hematological: Does not bruise/bleed easily.  Psychiatric/Behavioral: Negative for hallucinations. The patient is not nervous/anxious.  Resolved visual hallucination after Cymbalta discontinued.    Immunization History  Administered Date(s) Administered  . Influenza Whole 10/19/2012  . Influenza-Unspecified 10/18/2013, 10/17/2014, 09/02/2015  . PPD Test 01/22/2013, 11/28/2015  . Pneumococcal Polysaccharide-23 01/22/2013  . Td 02/21/2009  . Tdap 04/07/2015   Pertinent  Health Maintenance Due  Topic Date Due  . DEXA SCAN  03/25/1978  . PNA vac Low Risk Adult (2 of 2 - PCV13) 01/22/2014  . INFLUENZA VACCINE  07/13/2016   Fall Risk  11/14/2015 04/17/2015 09/26/2014 09/26/2014  Falls in the past year? No Yes No Yes  Number falls in past yr: - 1 - 2 or more  Injury with Fall? - Yes - -  Risk Factor Category  - High Fall Risk - -  Risk for fall due to : Impaired mobility;Impaired vision - - -   Functional Status Survey:    Filed Vitals:   06/01/16 1520  BP: 136/52  Pulse: 58  Temp: 98.2 F (36.8 C)  TempSrc: Oral  Resp: 20  Height: _0  (1.575 m)  Weight: 146 lb 9.6 oz (66.497 kg)   Body mass index is 26.81 kg/(m^2). Physical Exam  Constitutional: She is oriented to person, place, and time. She appears well-developed and well-nourished. No distress.  HENT:   Head: Normocephalic and atraumatic.  Right Ear: External ear normal.  Left Ear: External ear normal.  Nose: Nose normal.  Mouth/Throat: Oropharynx is clear and moist. No oropharyngeal exudate.  Left TM tube seen.   Eyes: Conjunctivae and EOM are normal. Pupils are equal, round, and reactive to light. Right eye exhibits no discharge. Left eye exhibits no discharge.  Neck: Normal range of motion. Neck supple. No JVD present. No thyromegaly present.  Cardiovascular: Exam reveals no gallop and no friction rub.   No murmur heard. Pulmonary/Chest: Effort normal. No respiratory distress. She has rales in the right lower field and the left lower field. She exhibits no tenderness.  Abdominal: Soft. Bowel sounds are normal. She exhibits no distension and no mass. There is no tenderness. There is no guarding. No hernia.  Musculoskeletal: Normal range of motion. She exhibits edema (pedal and ankles. ) and tenderness (c/o lower back and knees pain. ).  Chronic trace edema in ankles and stasis dermatitis, erythema, scaly, and painful ankles and feet. R>L  Lymphadenopathy:    She has no cervical adenopathy.  Neurological: She is alert and oriented to person, place, and time. She displays normal reflexes. No cranial nerve deficit. Coordination normal.  Skin: Skin is warm and dry. No rash noted. She is not diaphoretic. No erythema. No pallor.  chronic edema in ankles and stasis dermatitis, erythema, scaly, and painful ankles and feet. R>L  Psychiatric: She has a normal mood and affect. Her behavior is normal. Judgment and thought content normal.  Some confusion    Labs reviewed:  Recent Labs  11/04/15 0140 11/06/15 0545 11/19/15 05/04/16  NA 137 140 136* 141  K 3.9 3.7 4.7 4.2  CL 101 106  --   --   CO2 28 26  --   --   GLUCOSE 120* 81  --   --   BUN 28* 17 22* 26*  CREATININE 0.77 0.72 0.6 0.8  CALCIUM 8.9 8.8*  --   --     Recent Labs  11/04/15 0140 11/05/15 0530 11/06/15 0545 11/11/15  11/19/15 05/04/16  AST 171* 118* 89* _1 ALT 433* 298* 230* 88* 27 9  ALKPHOS 177* 144* 126  96 69 54  BILITOT 1.7* 1.1 1.3*  --   --   --   PROT 6.5 5.8* 5.9*  --   --   --   ALBUMIN 3.4* 3.2* 3.1*  --   --   --     Recent Labs  11/04/15 0015 05/04/16  WBC 7.9 6.1  HGB 15.1* 14.1  HCT 46.1* 43  MCV 92.6  --   PLT 177 157   Lab Results  Component Value Date   TSH 0.64 05/04/2016   No results found for: HGBA1C No results found for: CHOL, HDL, LDLCALC, LDLDIRECT, TRIG, CHOLHDL  Significant Diagnostic Results in last 30 days:  No results found.  Assessment/Plan  Pressure ulcer stage II Sacral coccygeal area excoriated and superficial stage II pressure ulcers. Pressure reduction, good personal hygiene, apply Mycolog II cream bid    HTN (hypertension) permissive blood pressure control. Continue Furosemide 66m, 05/04/16 Na 141, K 4.2, Bun 26, creat 0.82  Constipation No problem, continue Colace 206mdaily.      Edema Minimal edema in ankles, continue Furosemide 2069maily, 05/04/16 Na 141, K 4.2, Bun 26, creat 0.82  Neuropathic pain of both legs Tylenol prn available to her. Off Cymbalta-hallucination. Off Fentanyl per patient's request.    Family/ staff Communication: continue AL for care needs.   Labs/tests ordered: none

## 2016-06-02 NOTE — Assessment & Plan Note (Signed)
Sacral coccygeal area excoriated and superficial stage II pressure ulcers. Pressure reduction, good personal hygiene, apply Mycolog II cream bid

## 2016-07-21 ENCOUNTER — Non-Acute Institutional Stay: Payer: 59 | Admitting: Nurse Practitioner

## 2016-07-21 ENCOUNTER — Encounter: Payer: Self-pay | Admitting: Nurse Practitioner

## 2016-07-21 DIAGNOSIS — K5900 Constipation, unspecified: Secondary | ICD-10-CM | POA: Diagnosis not present

## 2016-07-21 DIAGNOSIS — R609 Edema, unspecified: Secondary | ICD-10-CM | POA: Diagnosis not present

## 2016-07-21 DIAGNOSIS — E039 Hypothyroidism, unspecified: Secondary | ICD-10-CM | POA: Diagnosis not present

## 2016-07-21 DIAGNOSIS — R2681 Unsteadiness on feet: Secondary | ICD-10-CM

## 2016-07-21 DIAGNOSIS — G5792 Unspecified mononeuropathy of left lower limb: Secondary | ICD-10-CM

## 2016-07-21 DIAGNOSIS — I10 Essential (primary) hypertension: Secondary | ICD-10-CM

## 2016-07-21 DIAGNOSIS — G5791 Unspecified mononeuropathy of right lower limb: Secondary | ICD-10-CM

## 2016-07-21 DIAGNOSIS — G5793 Unspecified mononeuropathy of bilateral lower limbs: Secondary | ICD-10-CM

## 2016-07-21 NOTE — Progress Notes (Signed)
Location:   [place] Room Number: 026 Place of Service:  SNF (31) Provider:  Marlana Latus  NP   Patient Care Team: [name], MD as PCP - General (Internal Medicine) [name], MD (Internal Medicine) [place] Amarri Satterly Otho Darner, NP as Nurse Practitioner (Nurse Practitioner) [place] Jazier Mcglamery Otho Darner, NP as Nurse Practitioner (Nurse Practitioner)  Extended Emergency Contact Information Primary Emergency Contact: [name] States of [place] Phone: [phone] Relation: Son  Code Status:  DNR Goals of care: Advanced Directive information Advanced Directives 07/21/2016  Does patient have an advance directive? Yes  Type of Paramedic of [place];Living will;Out of facility DNR (pink MOST or yellow form)  Does patient want to make changes to advanced directive? No - Patient declined  Copy of advanced directive(s) in chart? Yes  Would patient like information on creating an advanced directive? -  Pre-existing out of facility DNR order (yellow form or pink MOST form) -     Chief Complaint  Patient presents with  . Medical Management of Chronic Issues    HPI:  Pt is a 82 y.o. female seen today for medical management of chronic diseases.     Hx of thyroid, taking Levothyroxine 111mg, last TSH wnl.  Chronic pain back, legs, well controlled, off Fentanyl 243m/hr, blood pressure is controlled with Furosemide 201maily, Kcl 65m67mPast Medical History:  Diagnosis Date  . Abnormal liver function tests    11/11/15 AST 31, ALT 88, alk phos 96   . Acute kidney failure, unspecified (HCC)[place]11/2013  . Arthralgia 09/26/2014   Multiple joints: knees, shoulders, wrists, spine hips   . Arthritis   . Candidiasis of other urogenital sites 08/10/2012  . Cerebral embolism 05/23/2005  . CHF (congestive heart failure) (HCC)[place]. Cholelithiasis 11/04/2015  . Closed fracture of sacrum and coccyx without  mention of spinal cord injury 02/14/2012  . Constipation 05/03/2013  . Contusion of face, scalp, and neck except eye(s) 06/01/2012  . Contusion of wrist 06/01/2012  . COPD (chronic obstructive pulmonary disease) (HCC)[place]. COPD, mild (HCC)[place]8/2014  . Edema 04/20/2012  . H/O: CVA (cerebrovascular accident)   . Hearing loss 09/26/2014  . History of cancer of uterus   . HTN (hypertension), benign   . Hyperglycemia 11/14/2015  . Hypothyroidism   . Insomnia, unspecified 08/10/2012  . Open wound of knee, leg (except thigh), and ankle, without mention of complication 5/9/7/4/1287Other and unspecified hyperlipidemia 06/01/2012  . Other disorder of coccyx 01/27/2012  . Pain in joint, ankle and foot 06/14/2012  . Peripheral vascular disease, unspecified (HCC)[place]14/2013  . Personal history of fall 01/27/2012  . Pneumonia, organism unspecified 01/23/2005  . Rheumatic fever 09/26/1933   Age 75 10/01/14 ESR 13, RAF <10   . Seasonal allergies 05/03/2013  . Stasis dermatitis of both legs 09/26/2014  . Unspecified constipation 11/02/2012  . Unspecified hearing loss 02/08/2013  . Unspecified hereditary and idiopathic peripheral neuropathy 01/27/2012  . Urinary frequency 02/24/2014  . Ventricular fibrillation (HCC)[place]/2013   Past Surgical History:  Procedure Laterality Date  . ABDOMINAL HYSTERECTOMY  1990   for endometrial cancer  . CATARACT EXTRACTION W/ INTRAOCULAR LENS  IMPLANT, BILATERAL    . ECTOJuana DiazGUM SURGERY  1932  . MASTOIDECTOMY  1920   bilateral  . THYROIDECTOMY  1960  . TONSILLECTOMY  1916    Allergies  Allergen Reactions  . Amoxicillin     Unknown, patient unable to answer questionnaire   . Avelox [Moxifloxacin]     Unknown: listed on MAR  . Erythromycin     Unknown: listed on MAR  . Monistat [Miconazole]     Unknown: listed on MAR  . Morphine And Related     Unknown: listed on MAR  . Orange Juice [Orange Oil]     Unknown: listed on [facility]      Medication  List       Accurate as of 07/21/16 11:59 PM. Always use your most recent med list.          acetaminophen 325 MG tablet Commonly known as:  TYLENOL Take 650 mg by mouth every 4 (four) hours as needed for pain. Pain/headache   alum & mag hydroxide-simeth 200-200-20 MG/5ML suspension Commonly known as:  MAALOX/MYLANTA Take 30 mLs by mouth every 6 (six) hours as needed for indigestion or heartburn.   aspirin 81 MG chewable tablet Chew 81 mg by mouth daily.   B-complex with vitamin C tablet Take 1 tablet by mouth daily.   calcium citrate-vitamin D 315-200 MG-UNIT tablet Commonly known as:  CITRACAL+D Take 1 tablet by mouth daily.   docusate sodium 100 MG capsule Commonly known as:  COLACE Take 200 mg by mouth daily.   EQL CENTURY MATURE Tabs Take 1 tablet by mouth daily.   fexofenadine 60 MG tablet Commonly known as:  ALLEGRA Take 60 mg by mouth daily.   furosemide 20 MG tablet Commonly known as:  LASIX Take 1 tablet by mouth daily.   levothyroxine 150 MCG tablet Commonly known as:  SYNTHROID, LEVOTHROID Take 1 tablet by mouth daily.   MILK OF MAGNESIA PO Take 30 mLs by mouth daily as needed.   potassium chloride 10 MEQ tablet Commonly known as:  K-DUR,KLOR-CON Take 1 tablet by mouth daily.       Review of Systems  Constitutional: Negative for chills, diaphoresis and fever.  HENT: Positive for hearing loss (severe, f/u audiology, recently tube placed in the left ear drum,  hears better after impacted ceruen removed wtih ear lavage. ). Negative for congestion, ear discharge, ear pain and sore throat.   Eyes: Negative for photophobia, pain, discharge and redness.  Respiratory: Positive for cough. Negative for shortness of breath.   Cardiovascular: Positive for leg swelling (chronic. The patient stated its better than prior. ). Negative for chest pain and palpitations.       Chronic trace edema in ankles and stasis dermatitis, erythema, scaly, and painful ankles  and feet. R>L  Gastrointestinal: Negative for abdominal pain, blood in stool, constipation, diarrhea, nausea and vomiting.  Endocrine: Negative for polydipsia.  Genitourinary: Positive for frequency. Negative for dysuria, flank pain, hematuria and urgency.  Musculoskeletal: Positive for back pain. Negative for myalgias and neck pain.       Hand cram--warm water bottle helps at night.   Skin: Negative for rash.       Sacral coccygeal area excoriated and superficial stage II pressure ulcers.   Allergic/Immunologic: Negative for environmental allergies.  Neurological: Negative for dizziness, tremors, seizures and weakness.  Hematological: Does not bruise/bleed easily.  Psychiatric/Behavioral: Negative for hallucinations. The patient is not nervous/anxious.        Resolved visual hallucination after Cymbalta discontinued.    Immunization History  Administered Date(s) Administered  . Influenza Whole 10/19/2012  . Influenza-Unspecified 10/18/2013, 10/17/2014, 09/02/2015  . PPD Test 01/22/2013, 11/28/2015  . Pneumococcal Polysaccharide-23 01/22/2013  .  Td 02/21/2009  . Tdap 04/07/2015   Pertinent  Health Maintenance Due  Topic Date Due  . DEXA SCAN  03/25/1978  . PNA vac Low Risk Adult (2 of 2 - PCV13) 01/22/2014  . INFLUENZA VACCINE  07/13/2016   Fall Risk  11/14/2015 04/17/2015 09/26/2014 09/26/2014  Falls in the past year? No Yes No Yes  Number falls in past yr: - 1 - 2 or more  Injury with Fall? - Yes - -  Risk Factor Category  - High Fall Risk - -  Risk for fall due to : Impaired mobility;Impaired vision - - -   Functional Status Survey:    Vitals:   07/21/16 1531  BP: 132/67  Pulse: 84  Resp: 18  Temp: 97.4 F (36.3 C)  Weight: 146 lb (66.2 kg)  Height: 5' 2" (1.575 m)   Body mass index is 26.7 kg/m. Physical Exam  Constitutional: She is oriented to person, place, and time. She appears well-developed and well-nourished. No distress.  HENT:  Head: Normocephalic and  atraumatic.  Right Ear: External ear normal.  Left Ear: External ear normal.  Nose: Nose normal.  Mouth/Throat: Oropharynx is clear and moist. No oropharyngeal exudate.  Left TM tube seen.   Eyes: Conjunctivae and EOM are normal. Pupils are equal, round, and reactive to light. Right eye exhibits no discharge. Left eye exhibits no discharge.  Neck: Normal range of motion. Neck supple. No JVD present. No thyromegaly present.  Cardiovascular: Exam reveals no gallop and no friction rub.   No murmur heard. Pulmonary/Chest: Effort normal. No respiratory distress. She has rales in the right lower field and the left lower field. She exhibits no tenderness.  Abdominal: Soft. Bowel sounds are normal. She exhibits no distension and no mass. There is no tenderness. There is no guarding. No hernia.  Musculoskeletal: Normal range of motion. She exhibits edema (pedal and ankles. ) and tenderness (c/o lower back and knees pain. ).  Chronic trace edema in ankles/feet and stasis dermatitis, erythema, scaly, and painful ankles and feet. R>L  Lymphadenopathy:    She has no cervical adenopathy.  Neurological: She is alert and oriented to person, place, and time. She displays normal reflexes. No cranial nerve deficit. Coordination normal.  Skin: Skin is warm and dry. No rash noted. She is not diaphoretic. No erythema. No pallor.  chronic edema in ankles and stasis dermatitis, erythema, scaly, and painful ankles and feet. R>L  Psychiatric: She has a normal mood and affect. Her behavior is normal. Judgment and thought content normal.  Some confusion    Labs reviewed:  Recent Labs  11/04/15 0140 11/06/15 0545 11/19/15 05/04/16  NA 137 140 136* 141  K 3.9 3.7 4.7 4.2  CL 101 106  --   --   CO2 28 26  --   --   GLUCOSE 120* 81  --   --   BUN 28* 17 22* 26*  CREATININE 0.77 0.72 0.6 0.8  CALCIUM 8.9 8.8*  --   --     Recent Labs  11/04/15 0140 11/05/15 0530 11/06/15 0545 11/11/15 11/19/15 05/04/16    AST 171* 118* 89* _0 ALT 433* 298* 230* 88* 27 9  ALKPHOS 177* 144* 126 96 69 54  BILITOT 1.7* 1.1 1.3*  --   --   --   PROT 6.5 5.8* 5.9*  --   --   --   ALBUMIN 3.4* 3.2* 3.1*  --   --   --  Recent Labs  11/04/15 0015 05/04/16  WBC 7.9 6.1  HGB 15.1* 14.1  HCT 46.1* 43  MCV 92.6  --   PLT 177 157   Lab Results  Component Value Date   TSH 0.64 05/04/2016   No results found for: HGBA1C No results found for: CHOL, HDL, LDLCALC, LDLDIRECT, TRIG, CHOLHDL  Significant Diagnostic Results in last 30 days:  No results found.  Assessment/Plan There are no diagnoses linked to this encounter.HTN (hypertension) permissive blood pressure control. Continue Furosemide 85m, 05/04/16 Na 141, K 4.2, Bun 26, creat 0.82   Constipation No problem, continue Colace 2045mdaily.     Hypothyroidism Last TSH 0.64 05/04/16,  continueLevothyroxine 150101mdaily    Edema Minimal edema in ankles/feet, continue Furosemide 31m58mily, 05/04/16 Na 141, K 4.2, Bun 26, creat 0.82   Gait instability Continue to ambulates with walker, w/c to go further  Neuropathic pain of both legs Tylenol prn available to her. Off Cymbalta-hallucination. Off Fentanyl per patient's request.      Family/ staff Communication: continue AL for care assistance  Labs/tests ordered:  none

## 2016-07-22 NOTE — Assessment & Plan Note (Signed)
Tylenol prn available to her. Off Cymbalta-hallucination. Off Fentanyl per patient's request.

## 2016-07-22 NOTE — Assessment & Plan Note (Signed)
Continue to ambulates with walker, w/c to go further

## 2016-07-22 NOTE — Assessment & Plan Note (Signed)
No problem, continue Colace 200mg daily.  

## 2016-07-22 NOTE — Assessment & Plan Note (Signed)
Last TSH 0.64 05/04/16,  continueLevothyroxine 150mcg daily

## 2016-07-22 NOTE — Assessment & Plan Note (Signed)
permissive blood pressure control. Continue Furosemide 20mg , 05/04/16 Na 141, K 4.2, Bun 26, creat 0.82

## 2016-07-22 NOTE — Assessment & Plan Note (Signed)
Minimal edema in ankles/feet, continue Furosemide 20mg  daily, 05/04/16 Na 141, K 4.2, Bun 26, creat 0.82

## 2016-08-19 ENCOUNTER — Encounter: Payer: Self-pay | Admitting: Internal Medicine

## 2016-08-19 ENCOUNTER — Non-Acute Institutional Stay: Payer: 59 | Admitting: Internal Medicine

## 2016-08-19 DIAGNOSIS — I10 Essential (primary) hypertension: Secondary | ICD-10-CM

## 2016-08-19 DIAGNOSIS — M2550 Pain in unspecified joint: Secondary | ICD-10-CM

## 2016-08-19 DIAGNOSIS — J449 Chronic obstructive pulmonary disease, unspecified: Secondary | ICD-10-CM

## 2016-08-19 DIAGNOSIS — E039 Hypothyroidism, unspecified: Secondary | ICD-10-CM

## 2016-08-19 DIAGNOSIS — R739 Hyperglycemia, unspecified: Secondary | ICD-10-CM | POA: Diagnosis not present

## 2016-08-19 DIAGNOSIS — R2681 Unsteadiness on feet: Secondary | ICD-10-CM | POA: Diagnosis not present

## 2016-08-19 DIAGNOSIS — R609 Edema, unspecified: Secondary | ICD-10-CM

## 2016-08-19 NOTE — Progress Notes (Signed)
Progress Note     Location:    [place] Room Number: VV616 Place of Service:  ALF (13) Provider:  Jeanmarie Hubert, MD  Patient Care Team: [name], MD as PCP - General (Internal Medicine) [name], MD (Internal Medicine) [place] Man Otho Darner, NP as Nurse Practitioner (Nurse Practitioner) [place] Man Otho Darner, NP as Nurse Practitioner (Nurse Practitioner)  Extended Emergency Contact Information Primary Emergency Contact: [name] [place] of [place] Phone: [phone] Relation: Son  Code Status:  DNR Goals of care: Advanced Directive information Advanced Directives 08/19/2016  Does patient have an advance directive? Yes  Type of Paramedic of [place];Living will;Out of facility DNR (pink MOST or yellow form)  Does patient want to make changes to advanced directive? -  Copy of advanced directive(s) in chart? Yes  Would patient like information on creating an advanced directive? -  Pre-existing out of facility DNR order (yellow form or pink MOST form) -     Chief Complaint  Patient presents with  . Medical Management of Chronic Issues    routine    HPI:  Pt is a 82 y.o. female seen today for medical management of chronic diseases.    Edema, unspecified type - Sleeps in a recliner. Legs are in a slightly dependent position. Edema is chronic, persistent, but unchanged.  Essential hypertension - controlled  Hyperglycemia - controlled  Gait instability - using walker  COPD, mild (HCC) - some dyspnea when talki g  Arthralgia - generalized and migratory  Hypothyroidism, unspecified hypothyroidism type - compensated     Past Medical History:  Diagnosis Date  . Abnormal liver function tests    11/11/15 AST 31, ALT 88, alk phos 96   . Acute kidney failure, unspecified ([place]) 01/24/2012  . Arthralgia 09/26/2014   Multiple joints: knees, shoulders, wrists, spine hips   . Arthritis    . Candidiasis of other urogenital sites 08/10/2012  . Cerebral embolism 05/23/2005  . CHF (congestive heart failure) ([place])   . Cholelithiasis 11/04/2015  . Closed fracture of sacrum and coccyx without mention of spinal cord injury 02/14/2012  . Constipation 05/03/2013  . Contusion of face, scalp, and neck except eye(s) 06/01/2012  . Contusion of wrist 06/01/2012  . COPD (chronic obstructive pulmonary disease) ([place])   . COPD, mild ([place]) 01/20/2013  . Edema 04/20/2012  . H/O: CVA (cerebrovascular accident)   . Hearing loss 09/26/2014  . History of cancer of uterus   . HTN (hypertension), benign   . Hyperglycemia 11/14/2015  . Hypothyroidism   . Insomnia, unspecified 08/10/2012  . Open wound of knee, leg (except thigh), and ankle, without mention of complication 03/20/5461  . Other and unspecified hyperlipidemia 06/01/2012  . Other disorder of coccyx 01/27/2012  . Pain in joint, ankle and foot 06/14/2012  . Peripheral vascular disease, unspecified ([place]) 01/27/2012  . Personal history of fall 01/27/2012  . Pneumonia, organism unspecified 01/23/2005  . Rheumatic fever 09/26/1933   Age 28 10/01/14 ESR 13, RAF <10   . Seasonal allergies 05/03/2013  . Stasis dermatitis of both legs 09/26/2014  . Unspecified constipation 11/02/2012  . Unspecified hearing loss 02/08/2013  . Unspecified hereditary and idiopathic peripheral neuropathy 01/27/2012  . Urinary frequency 02/24/2014  . Ventricular fibrillation ([place]) 01/24/2012   Past Surgical History:  Procedure Laterality Date  . ABDOMINAL HYSTERECTOMY  1990   for endometrial cancer  . CATARACT EXTRACTION W/ INTRAOCULAR LENS  IMPLANT, BILATERAL    .  [place]  . GUM SURGERY  1932  . MASTOIDECTOMY  1920   bilateral  . THYROIDECTOMY  1960  . TONSILLECTOMY  1916    Allergies  Allergen Reactions  . Amoxicillin     Unknown, patient unable to answer questionnaire   . Avelox [Moxifloxacin]     Unknown: listed on MAR  . Erythromycin      Unknown: listed on MAR  . Monistat [Miconazole]     Unknown: listed on MAR  . Morphine And Related     Unknown: listed on MAR  . Orange Juice [Orange Oil]     Unknown: listed on [facility]      Medication List       Accurate as of 08/19/16  2:39 PM. Always use your most recent med list.          acetaminophen 325 MG tablet Commonly known as:  TYLENOL Take 650 mg by mouth every 4 (four) hours as needed for pain. Pain/headache   alum & mag hydroxide-simeth 200-200-20 MG/5ML suspension Commonly known as:  MAALOX/MYLANTA Take 30 mLs by mouth every 6 (six) hours as needed for indigestion or heartburn.   aspirin 81 MG chewable tablet Chew 81 mg by mouth daily.   B-complex with vitamin C tablet Take 1 tablet by mouth daily.   calcium citrate-vitamin D 315-200 MG-UNIT tablet Commonly known as:  CITRACAL+D Take 1 tablet by mouth daily.   docusate sodium 100 MG capsule Commonly known as:  COLACE Take 200 mg by mouth daily.   EQL CENTURY MATURE Tabs Take 1 tablet by mouth daily.   fexofenadine 60 MG tablet Commonly known as:  ALLEGRA Take 60 mg by mouth daily.   furosemide 20 MG tablet Commonly known as:  LASIX Take 1 tablet by mouth daily.   levothyroxine 150 MCG tablet Commonly known as:  SYNTHROID, LEVOTHROID Take 1 tablet by mouth daily.   MILK OF MAGNESIA PO Take 30 mLs by mouth daily as needed.   potassium chloride 10 MEQ tablet Commonly known as:  K-DUR,KLOR-CON Take 1 tablet by mouth daily.       Review of Systems  Constitutional: Negative for chills, diaphoresis and fever.  HENT: Positive for hearing loss (severe, f/u audiology, recently tube placed in the left ear drum,  hears better after impacted ceruen removed wtih ear lavage. ). Negative for congestion, ear discharge, ear pain and sore throat.   Eyes: Negative for photophobia, pain, discharge and redness.  Respiratory: Positive for cough. Negative for shortness of breath.   Cardiovascular: Positive for  leg swelling (chronic. The patient stated its better than prior. ). Negative for chest pain and palpitations.       Chronic trace edema in ankles and stasis dermatitis, erythema, scaly, and painful ankles and feet. R>L  Gastrointestinal: Negative for abdominal pain, blood in stool, constipation, diarrhea, nausea and vomiting.  Endocrine: Negative for polydipsia.  Genitourinary: Positive for frequency. Negative for dysuria, flank pain, hematuria and urgency.  Musculoskeletal: Positive for back pain. Negative for myalgias and neck pain.       Hand cram--warm water bottle helps at night.   Skin: Negative for rash.       Sacral coccygeal area excoriated and superficial stage II pressure ulcers.   Allergic/Immunologic: Negative for environmental allergies.  Neurological: Negative for dizziness, tremors, seizures and weakness.  Hematological: Does not bruise/bleed easily.  Psychiatric/Behavioral: Negative for hallucinations. The patient is not nervous/anxious.        Resolved visual  hallucination after Cymbalta discontinued.    Immunization History  Administered Date(s) Administered  . Influenza Whole 10/19/2012  . Influenza-Unspecified 10/18/2013, 10/17/2014, 09/02/2015  . PPD Test 01/22/2013, 11/28/2015  . Pneumococcal Polysaccharide-23 01/22/2013  . Td 02/21/2009  . Tdap 04/07/2015   Pertinent  Health Maintenance Due  Topic Date Due  . DEXA SCAN  03/25/1978  . PNA vac Low Risk Adult (2 of 2 - PCV13) 01/22/2014  . INFLUENZA VACCINE  07/13/2016   Fall Risk  11/14/2015 04/17/2015 09/26/2014 09/26/2014  Falls in the past year? No Yes No Yes  Number falls in past yr: - 1 - 2 or more  Injury with Fall? - Yes - -  Risk Factor Category  - High Fall Risk - -  Risk for fall due to : Impaired mobility;Impaired vision - - -   Functional Status Survey:    Vitals:   08/19/16 1433  BP: 130/70  Pulse: 70  Resp: 18  Temp: 97 F (36.1 C)  Weight: 146 lb (66.2 kg)  Height: 5' 2"  (1.575 m)    Body mass index is 26.7 kg/m. Physical Exam  Constitutional: She is oriented to person, place, and time. She appears well-developed and well-nourished. No distress.  HENT:  Head: Normocephalic and atraumatic.  Right Ear: External ear normal.  Left Ear: External ear normal.  Nose: Nose normal.  Mouth/Throat: Oropharynx is clear and moist. No oropharyngeal exudate.  Left TM tube seen.   Eyes: Conjunctivae and EOM are normal. Pupils are equal, round, and reactive to light. Right eye exhibits no discharge. Left eye exhibits no discharge.  Neck: Normal range of motion. Neck supple. No JVD present. No thyromegaly present.  Cardiovascular: Exam reveals no gallop and no friction rub.   No murmur heard. Pulmonary/Chest: Effort normal. No respiratory distress. She has rales in the right lower field and the left lower field. She exhibits no tenderness.  Abdominal: Soft. Bowel sounds are normal. She exhibits no distension and no mass. There is no tenderness. There is no guarding. No hernia.  Musculoskeletal: Normal range of motion. She exhibits edema (pedal and ankles. ) and tenderness (c/o lower back and knees pain. ).  Chronic trace edema in ankles/feet and stasis dermatitis, erythema, scaly, and painful ankles and feet. R>L  Lymphadenopathy:    She has no cervical adenopathy.  Neurological: She is alert and oriented to person, place, and time. She displays normal reflexes. No cranial nerve deficit. Coordination normal.  Skin: Skin is warm and dry. No rash noted. She is not diaphoretic. No erythema. No pallor.  chronic edema in ankles and stasis dermatitis, erythema, scaly, and painful ankles and feet. R>L  Psychiatric: She has a normal mood and affect. Her behavior is normal. Judgment and thought content normal.  Some confusion    Labs reviewed:  Recent Labs  11/04/15 0140 11/06/15 0545 11/19/15 05/04/16  NA 137 140 136* 141  K 3.9 3.7 4.7 4.2  CL 101 106  --   --   CO2 28 26  --   --    GLUCOSE 120* 81  --   --   BUN 28* 17 22* 26*  CREATININE 0.77 0.72 0.6 0.8  CALCIUM 8.9 8.8*  --   --     Recent Labs  11/04/15 0140 11/05/15 0530 11/06/15 0545 11/11/15 11/19/15 05/04/16  AST 171* 118* 89* 31 29 16   ALT 433* 298* 230* 88* 27 9  ALKPHOS 177* 144* 126 96 69 54  BILITOT 1.7* 1.1 1.3*  --   --   --  PROT 6.5 5.8* 5.9*  --   --   --   ALBUMIN 3.4* 3.2* 3.1*  --   --   --     Recent Labs  11/04/15 0015 05/04/16  WBC 7.9 6.1  HGB 15.1* 14.1  HCT 46.1* 43  MCV 92.6  --   PLT 177 157   Lab Results  Component Value Date   TSH 0.64 05/04/2016   No results found for: HGBA1C  Assessment/Plan 1. Edema, unspecified type Chronic, persistent, unchanged. I think largely related to infected early June and position is that of time she spends in a recliner chair.  2. Essential hypertension Controlled  3. Hyperglycemia Controlled  4. Gait instability Unchanged. Continue use wheelchair.  5. COPD, mild (HCC) Unchanged, short of breath which speeds.  6. Arthralgia generalized  7. Hypothyroidism, unspecified hypothyroidism type compensated

## 2016-09-01 ENCOUNTER — Non-Acute Institutional Stay: Payer: 59 | Admitting: Nurse Practitioner

## 2016-09-01 ENCOUNTER — Encounter: Payer: Self-pay | Admitting: Nurse Practitioner

## 2016-09-01 DIAGNOSIS — I10 Essential (primary) hypertension: Secondary | ICD-10-CM | POA: Diagnosis not present

## 2016-09-01 DIAGNOSIS — J449 Chronic obstructive pulmonary disease, unspecified: Secondary | ICD-10-CM

## 2016-09-01 DIAGNOSIS — E039 Hypothyroidism, unspecified: Secondary | ICD-10-CM

## 2016-09-01 DIAGNOSIS — L0292 Furuncle, unspecified: Secondary | ICD-10-CM

## 2016-09-01 DIAGNOSIS — K5900 Constipation, unspecified: Secondary | ICD-10-CM | POA: Diagnosis not present

## 2016-09-01 DIAGNOSIS — R609 Edema, unspecified: Secondary | ICD-10-CM

## 2016-09-01 DIAGNOSIS — S32000S Wedge compression fracture of unspecified lumbar vertebra, sequela: Secondary | ICD-10-CM

## 2016-09-01 NOTE — Assessment & Plan Note (Signed)
No problem, continue Colace 200mg daily.  

## 2016-09-01 NOTE — Assessment & Plan Note (Signed)
permissive blood pressure control. Continue Furosemide 20mg, 05/04/16 Na 141, K 4.2, Bun 26, creat 0.82  

## 2016-09-01 NOTE — Progress Notes (Signed)
Location:  [place] Room Number: 485 Place of Service:  ALF [phone]) Provider:  Mast, Manxie  NP  Jeanmarie Hubert, MD  Patient Care Team: [name], MD as PCP - General (Internal Medicine) [name], MD (Internal Medicine) [place] Man Otho Darner, NP as Nurse Practitioner (Nurse Practitioner) [place] Man Otho Darner, NP as Nurse Practitioner (Nurse Practitioner)  Extended Emergency Contact Information Primary Emergency Contact: [name] [place] of [place] Phone: [phone] Relation: Son  Code Status:  DNR Goals of care: Advanced Directive information Advanced Directives 09/01/2016  Does patient have an advance directive? Yes  Type of Paramedic of [place];Living will;Out of facility DNR (pink MOST or yellow form)  Does patient want to make changes to advanced directive? No - Patient declined  Copy of advanced directive(s) in chart? Yes  Would patient like information on creating an advanced directive? -  Pre-existing out of facility DNR order (yellow form or pink MOST form) -     Chief Complaint  Patient presents with  . Acute Visit    Two small boils on buttocks    HPI:  Pt is a 82 y.o. female seen today for an acute visit for two small boils on buttocks, needs I+D    Hx of thyroid, taking Levothyroxine 184mg, last TSH 0.64 05/04/16.  Chronic pain back, legs, well controlled, off Fentanyl 254m/hr, blood pressure is controlled with Furosemide '20mg'$  daily, Kcl 1035m     Past Medical History:      Past Medical History:  Diagnosis Date  . Abnormal liver function tests    11/11/15 AST 31, ALT 88, alk phos 96   . Acute kidney failure, unspecified ([place]/10/2012  . Arthralgia 09/26/2014   Multiple joints: knees, shoulders, wrists, spine hips   . Arthritis   . Candidiasis of other urogenital sites 08/10/2012  . Cerebral embolism 05/23/2005  . CHF (congestive heart failure)  ([place] . Cholelithiasis 11/04/2015  . Closed fracture of sacrum and coccyx without mention of spinal cord injury 02/14/2012  . Constipation 05/03/2013  . Contusion of face, scalp, and neck except eye(s) 06/01/2012  . Contusion of wrist 06/01/2012  . COPD (chronic obstructive pulmonary disease) ([place] . COPD, mild ([facility]/07/2013  . Edema 04/20/2012  . H/O: CVA (cerebrovascular accident)   . Hearing loss 09/26/2014  . History of cancer of uterus   . HTN (hypertension), benign   . Hyperglycemia 11/14/2015  . Hypothyroidism   . Insomnia, unspecified 08/10/2012  . Open wound of knee, leg (except thigh), and ankle, without mention of complication 5/91/07/2992 Other and unspecified hyperlipidemia 06/01/2012  . Other disorder of coccyx 01/27/2012  . Pain in joint, ankle and foot 06/14/2012  . Peripheral vascular disease, unspecified ([place]/14/2013  . Personal history of fall 01/27/2012  . Pneumonia, organism unspecified 01/23/2005  . Rheumatic fever 09/26/1933   Age 89 10/01/14 ESR 13, RAF <10   . Seasonal allergies 05/03/2013  . Stasis dermatitis of both legs 09/26/2014  . Unspecified constipation 11/02/2012  . Unspecified hearing loss 02/08/2013  . Unspecified hereditary and idiopathic peripheral neuropathy 01/27/2012  . Urinary frequency 02/24/2014  . Ventricular fibrillation ([place]/10/2012   Past Surgical History:  Procedure Laterality Date  . ABDOMINAL HYSTERECTOMY  1990   for endometrial cancer  . CATARACT EXTRACTION W/ INTRAOCULAR LENS  IMPLANT, BILATERAL    . [place] G[place]  SURG[place]  1932  . MASTOIDECTOMY  1920   bilateral  . THYROIDECTOMY  1960  . TONSILLECTOMY  1916    Allergies  Allergen Reactions  . Amoxicillin     Unknown, patient unable to answer questionnaire   . Avelox [Moxifloxacin]     Unknown: listed on MAR  . Erythromycin     Unknown: listed on MAR  . Monistat [Miconazole]     Unknown: listed on MAR  . Morphine And Related     Unknown:  listed on MAR  . Orange Juice [Orange Oil]     Unknown: listed on [facility]      Medication List       Accurate as of 09/01/16  2:36 PM. Always use your most recent med list.          acetaminophen 325 MG tablet Commonly known as:  TYLENOL Take 650 mg by mouth every 4 (four) hours as needed for pain. Pain/headache   alum & mag hydroxide-simeth 200-200-20 MG/5ML suspension Commonly known as:  MAALOX/MYLANTA Take 30 mLs by mouth every 6 (six) hours as needed for indigestion or heartburn.   aspirin 81 MG chewable tablet Chew 81 mg by mouth daily.   B-complex with vitamin C tablet Take 1 tablet by mouth daily.   calcium citrate-vitamin D 315-200 MG-UNIT tablet Commonly known as:  CITRACAL+D Take 1 tablet by mouth daily.   docusate sodium 100 MG capsule Commonly known as:  COLACE Take 200 mg by mouth daily.   EQL CENTURY MATURE Tabs Take 1 tablet by mouth daily.   fexofenadine 60 MG tablet Commonly known as:  ALLEGRA Take 60 mg by mouth daily.   furosemide 20 MG tablet Commonly known as:  LASIX Take 1 tablet by mouth daily.   levothyroxine 150 MCG tablet Commonly known as:  SYNTHROID, LEVOTHROID Take 1 tablet by mouth daily.   MILK OF MAGNESIA PO Take 30 mLs by mouth daily as needed.   potassium chloride 10 MEQ tablet Commonly known as:  K-DUR,KLOR-CON Take 1 tablet by mouth daily.       Review of Systems  Constitutional: Negative for chills, diaphoresis and fever.  HENT: Positive for hearing loss (severe, f/u audiology, recently tube placed in the left ear drum,  hears better after impacted ceruen removed wtih ear lavage. ). Negative for congestion, ear discharge, ear pain and sore throat.   Eyes: Negative for photophobia, pain, discharge and redness.  Respiratory: Positive for cough. Negative for shortness of breath.   Cardiovascular: Positive for leg swelling (chronic. The patient stated its better than prior. ). Negative for chest pain and palpitations.        Chronic trace edema in ankles and stasis dermatitis, erythema, scaly, and painful ankles and feet. R>L  Gastrointestinal: Negative for abdominal pain, blood in stool, constipation, diarrhea, nausea and vomiting.  Endocrine: Negative for polydipsia.  Genitourinary: Positive for frequency. Negative for dysuria, flank pain, hematuria and urgency.  Musculoskeletal: Positive for back pain. Negative for myalgias and neck pain.       Hand cram--warm water bottle helps at night.   Skin: Negative for rash.       Sacral coccygeal area excoriated and superficial stage II pressure ulcers. Small boils x2 buttocks   Allergic/Immunologic: Negative for environmental allergies.  Neurological: Negative for dizziness, tremors, seizures and weakness.  Hematological: Does not bruise/bleed easily.  Psychiatric/Behavioral: Negative for hallucinations. The patient is not nervous/anxious.        Resolved visual hallucination after Cymbalta discontinued.  Immunization History  Administered Date(s) Administered  . Influenza Whole 10/19/2012  . Influenza-Unspecified 10/18/2013, 10/17/2014, 09/02/2015  . PPD Test 01/22/2013, 11/28/2015  . Pneumococcal Polysaccharide-23 01/22/2013  . Td 02/21/2009  . Tdap 04/07/2015   Pertinent  Health Maintenance Due  Topic Date Due  . DEXA SCAN  03/25/1978  . PNA vac Low Risk Adult (2 of 2 - PCV13) 01/22/2014  . INFLUENZA VACCINE  07/13/2016   Fall Risk  11/14/2015 04/17/2015 09/26/2014 09/26/2014  Falls in the past year? No Yes No Yes  Number falls in past yr: - 1 - 2 or more  Injury with Fall? - Yes - -  Risk Factor Category  - High Fall Risk - -  Risk for fall due to : Impaired mobility;Impaired vision - - -   Functional Status Survey:    Vitals:   09/01/16 1135  BP: 132/68  Pulse: 76  Resp: 20  Temp: 97.9 F (36.6 C)  Weight: 146 lb 9.6 oz (66.5 kg)  Height: 5' 2" (1.575 m)   Body mass index is 26.81 kg/m. Physical Exam  Constitutional: She is oriented  to person, place, and time. She appears well-developed and well-nourished. No distress.  HENT:  Head: Normocephalic and atraumatic.  Right Ear: External ear normal.  Left Ear: External ear normal.  Nose: Nose normal.  Mouth/Throat: Oropharynx is clear and moist. No oropharyngeal exudate.  Left TM tube seen.   Eyes: Conjunctivae and EOM are normal. Pupils are equal, round, and reactive to light. Right eye exhibits no discharge. Left eye exhibits no discharge.  Neck: Normal range of motion. Neck supple. No JVD present. No thyromegaly present.  Cardiovascular: Exam reveals no gallop and no friction rub.   No murmur heard. Pulmonary/Chest: Effort normal. No respiratory distress. She has rales in the right lower field and the left lower field. She exhibits no tenderness.  Abdominal: Soft. Bowel sounds are normal. She exhibits no distension and no mass. There is no tenderness. There is no guarding. No hernia.  Musculoskeletal: Normal range of motion. She exhibits edema (pedal and ankles. ) and tenderness (c/o lower back and knees pain. ).  Chronic trace edema in ankles/feet and stasis dermatitis, erythema, scaly, and painful ankles and feet. R>L  Lymphadenopathy:    She has no cervical adenopathy.  Neurological: She is alert and oriented to person, place, and time. She displays normal reflexes. No cranial nerve deficit. Coordination normal.  Skin: Skin is warm and dry. No rash noted. She is not diaphoretic. No erythema. No pallor.  chronic edema in ankles and stasis dermatitis, erythema, scaly, and painful ankles and feet. R>L. x2 small boils buttocks.   Psychiatric: She has a normal mood and affect. Her behavior is normal. Judgment and thought content normal.  Some confusion    Labs reviewed:  Recent Labs  11/04/15 0140 11/06/15 0545 11/19/15 05/04/16  NA 137 140 136* 141  K 3.9 3.7 4.7 4.2  CL 101 106  --   --   CO2 28 26  --   --   GLUCOSE 120* 81  --   --   BUN 28* 17 22* 26*    CREATININE 0.77 0.72 0.6 0.8  CALCIUM 8.9 8.8*  --   --     Recent Labs  11/04/15 0140 11/05/15 0530 11/06/15 0545 11/11/15 11/19/15 05/04/16  AST 171* 118* 89* _0 ALT 433* 298* 230* 88* 27 9  ALKPHOS 177* 144* 126 96 69 54  BILITOT 1.7* 1.1  1.3*  --   --   --   PROT 6.5 5.8* 5.9*  --   --   --   ALBUMIN 3.4* 3.2* 3.1*  --   --   --     Recent Labs  11/04/15 0015 05/04/16  WBC 7.9 6.1  HGB 15.1* 14.1  HCT 46.1* 43  MCV 92.6  --   PLT 177 157   Lab Results  Component Value Date   TSH 0.64 05/04/2016   No results found for: HGBA1C No results found for: CHOL, HDL, LDLCALC, LDLDIRECT, TRIG, CHOLHDL  Significant Diagnostic Results in last 30 days:  No results found.  Assessment/Plan There are no diagnoses linked to this encounter.   Family/ staff Communication:   Labs/tests ordered:

## 2016-09-01 NOTE — Assessment & Plan Note (Signed)
Last TSH 0.64 05/04/16,  continueLevothyroxine 150mcg daily   

## 2016-09-01 NOTE — Assessment & Plan Note (Signed)
Tylenol prn available to her. Off Cymbalta-hallucination. Off Fentanyl per patient's request.  

## 2016-09-01 NOTE — Assessment & Plan Note (Signed)
Minimal edema in ankles/feet, continue Furosemide 20mg daily, 05/04/16 Na 141, K 4.2, Bun 26, creat 0.82  

## 2016-09-01 NOTE — Assessment & Plan Note (Signed)
05/26/15 CXR no active cardiopulmonary disease suspected.  Stable.  

## 2016-09-15 ENCOUNTER — Non-Acute Institutional Stay: Payer: 59 | Admitting: Nurse Practitioner

## 2016-09-15 ENCOUNTER — Encounter: Payer: Self-pay | Admitting: Nurse Practitioner

## 2016-09-15 DIAGNOSIS — K5900 Constipation, unspecified: Secondary | ICD-10-CM | POA: Diagnosis not present

## 2016-09-15 DIAGNOSIS — E039 Hypothyroidism, unspecified: Secondary | ICD-10-CM | POA: Diagnosis not present

## 2016-09-15 DIAGNOSIS — R609 Edema, unspecified: Secondary | ICD-10-CM | POA: Diagnosis not present

## 2016-09-15 DIAGNOSIS — S0003XA Contusion of scalp, initial encounter: Secondary | ICD-10-CM

## 2016-09-15 DIAGNOSIS — R2681 Unsteadiness on feet: Secondary | ICD-10-CM

## 2016-09-15 DIAGNOSIS — I10 Essential (primary) hypertension: Secondary | ICD-10-CM | POA: Diagnosis not present

## 2016-09-15 NOTE — Assessment & Plan Note (Signed)
permissive blood pressure control. Continue Furosemide 20mg, 05/04/16 Na 141, K 4.2, Bun 26, creat 0.82  

## 2016-09-15 NOTE — Assessment & Plan Note (Signed)
Minimal edema in ankles/feet, continue Furosemide 20mg daily, 05/04/16 Na 141, K 4.2, Bun 26, creat 0.82  

## 2016-09-15 NOTE — Assessment & Plan Note (Signed)
Continue neuro check, it should heal.

## 2016-09-15 NOTE — Assessment & Plan Note (Signed)
Continue to ambulates with walker, w/c to go further, risk falling.   

## 2016-09-15 NOTE — Assessment & Plan Note (Signed)
Last TSH 0.64 05/04/16,  continueLevothyroxine 150mcg daily   

## 2016-09-15 NOTE — Progress Notes (Signed)
Location:  [place] Room Number: [place] of Service:  ALF [phone]) Provider: Britaney Espaillat, Manxie NP  Jeanmarie Hubert, MD  Patient Care Team: [name], MD as PCP - General (Internal Medicine) [name], MD (Internal Medicine) [place] Vernee Baines Otho Darner, NP as Nurse Practitioner (Nurse Practitioner) [place] Yamilee Harmes Otho Darner, NP as Nurse Practitioner (Nurse Practitioner)  Extended Emergency Contact Information Primary Emergency Contact: [name] States of [place] Phone: [phone] Relation: Son  Code Status:  DNR Goals of care: Advanced Directive information Advanced Directives 09/15/2016  Does patient have an advance directive? Yes  Type of Paramedic of [place];Living will;Out of facility DNR (pink MOST or yellow form)  Does patient want to make changes to advanced directive? No - Patient declined  Copy of advanced directive(s) in chart? Yes  Would patient like information on creating an advanced directive? -  Pre-existing out of facility DNR order (yellow form or pink MOST form) -     Chief Complaint  Patient presents with  . Acute Visit    [place] this morning,bump on (rt) side of head    HPI:  Pt is a 82 y.o. female seen today for an acute visit for sustained right side of head hematoma from fall 09/15/16, no new focal neurological deficit noted.   Hx of thyroid, taking Levothyroxine 161mg, last TSH 0.64 05/04/16. Chronic pain back, legs, well controlled, off Fentanyl 277m/hr, blood pressure is controlled with Furosemide 2088maily, Kcl 39m32m Past Medical History:  Diagnosis Date  . Abnormal liver function tests    11/11/15 AST 31, ALT 88, alk phos 96   . Acute kidney failure, unspecified 01/24/2012  . Arthralgia 09/26/2014   Multiple joints: knees, shoulders, wrists, spine hips   . Arthritis   . Candidiasis of other urogenital sites 08/10/2012  . Cerebral embolism 05/23/2005    . CHF (congestive heart failure) (HCC)[place]. Cholelithiasis 11/04/2015  . Closed fracture of sacrum and coccyx without mention of spinal cord injury 02/14/2012  . Constipation 05/03/2013  . Contusion of face, scalp, and neck except eye(s) 06/01/2012  . Contusion of wrist 06/01/2012  . COPD (chronic obstructive pulmonary disease) (HCC)[place]. COPD, mild (HCC)[place]8/2014  . Edema 04/20/2012  . H/O: CVA (cerebrovascular accident)   . Hearing loss 09/26/2014  . History of cancer of uterus   . HTN (hypertension), benign   . Hyperglycemia 11/14/2015  . Hypothyroidism   . Insomnia, unspecified 08/10/2012  . Open wound of knee, leg (except thigh), and ankle, without mention of complication 5/9/9/5/6213Other and unspecified hyperlipidemia 06/01/2012  . Other disorder of coccyx 01/27/2012  . Pain in joint, ankle and foot 06/14/2012  . Peripheral vascular disease, unspecified 01/27/2012  . Personal history of fall 01/27/2012  . Pneumonia, organism unspecified(486) 01/23/2005  . Rheumatic fever 09/26/1933   Age 76 10/01/14 ESR 13, RAF <10   . Seasonal allergies 05/03/2013  . Stasis dermatitis of both legs 09/26/2014  . Unspecified constipation 11/02/2012  . Unspecified hearing loss 02/08/2013  . Unspecified hereditary and idiopathic peripheral neuropathy 01/27/2012  . Urinary frequency 02/24/2014  . Ventricular fibrillation (HCC)[place]11/2013   Past Surgical History:  Procedure Laterality Date  . ABDOMINAL HYSTERECTOMY  1990   for endometrial cancer  . CATARACT EXTRACTION W/ INTRAOCULAR LENS  IMPLANT, BILATERAL    . [place]UM SURGERY  1932  .  MASTOIDECTOMY  1920   bilateral  . THYROIDECTOMY  1960  . TONSILLECTOMY  1916    Allergies  Allergen Reactions  . Amoxicillin     Unknown, patient unable to answer questionnaire   . Avelox [Moxifloxacin]     Unknown: listed on MAR  . Erythromycin     Unknown: listed on MAR  . Monistat [Miconazole]     Unknown: listed on MAR  .  Morphine And Related     Unknown: listed on MAR  . Orange Juice [Orange Oil]     Unknown: listed on [facility]      Medication List       Accurate as of 09/15/16  2:00 PM. Always use your most recent med list.          acetaminophen 325 MG tablet Commonly known as:  TYLENOL Take 650 mg by mouth every 4 (four) hours as needed for pain. Pain/headache   alum & mag hydroxide-simeth 200-200-20 MG/5ML suspension Commonly known as:  MAALOX/MYLANTA Take 30 mLs by mouth every 6 (six) hours as needed for indigestion or heartburn.   aspirin 81 MG chewable tablet Chew 81 mg by mouth daily.   B-complex with vitamin C tablet Take 1 tablet by mouth daily.   calcium citrate-vitamin D 315-200 MG-UNIT tablet Commonly known as:  CITRACAL+D Take 1 tablet by mouth daily.   docusate sodium 100 MG capsule Commonly known as:  COLACE Take 200 mg by mouth daily.   EQL CENTURY MATURE Tabs Take 1 tablet by mouth daily.   fexofenadine 60 MG tablet Commonly known as:  ALLEGRA Take 60 mg by mouth daily.   furosemide 20 MG tablet Commonly known as:  LASIX Take 1 tablet by mouth daily.   levothyroxine 150 MCG tablet Commonly known as:  SYNTHROID, LEVOTHROID Take 1 tablet by mouth daily.   MILK OF MAGNESIA PO Take 30 mLs by mouth daily as needed.   potassium chloride 10 MEQ tablet Commonly known as:  K-DUR,KLOR-CON Take 1 tablet by mouth daily.       Review of Systems  Constitutional: Negative for chills, diaphoresis and fever.  HENT: Positive for hearing loss (severe, f/u audiology, recently tube placed in the left ear drum,  hears better after impacted ceruen removed wtih ear lavage. ). Negative for congestion, ear discharge, ear pain and sore throat.   Eyes: Negative for photophobia, pain, discharge and redness.  Respiratory: Positive for cough. Negative for shortness of breath.   Cardiovascular: Positive for leg swelling (chronic. The patient stated its better than prior. ). Negative for  chest pain and palpitations.       Chronic trace edema in ankles and stasis dermatitis, erythema, scaly, and painful ankles and feet. R>L  Gastrointestinal: Negative for abdominal pain, blood in stool, constipation, diarrhea, nausea and vomiting.  Endocrine: Negative for polydipsia.  Genitourinary: Positive for frequency. Negative for dysuria, flank pain, hematuria and urgency.  Musculoskeletal: Positive for back pain. Negative for myalgias and neck pain.       Hand cram--warm water bottle helps at night.   Skin: Negative for rash.       Sacral coccygeal area excoriated and superficial stage II pressure ulcers. Right sided head hematoma  Allergic/Immunologic: Negative for environmental allergies.  Neurological: Negative for dizziness, tremors, seizures and weakness.  Hematological: Does not bruise/bleed easily.  Psychiatric/Behavioral: Negative for hallucinations. The patient is not nervous/anxious.        Resolved visual hallucination after Cymbalta discontinued.    Immunization History  Administered Date(s) Administered  . Influenza Whole 10/19/2012  . Influenza-Unspecified 10/18/2013, 10/17/2014, 09/02/2015  . PPD Test 01/22/2013, 11/28/2015  . Pneumococcal Polysaccharide-23 01/22/2013  . Td 02/21/2009  . Tdap 04/07/2015   Pertinent  Health Maintenance Due  Topic Date Due  . DEXA SCAN  03/25/1978  . PNA vac Low Risk Adult (2 of 2 - PCV13) 01/22/2014  . INFLUENZA VACCINE  07/13/2016   Fall Risk  11/14/2015 04/17/2015 09/26/2014 09/26/2014  Falls in the past year? No Yes No Yes  Number falls in past yr: - 1 - 2 or more  Injury with Fall? - Yes - -  Risk Factor Category  - High Fall Risk - -  Risk for fall due to : Impaired mobility;Impaired vision - - -   Functional Status Survey:    Vitals:   09/15/16 1043  BP: 130/90  Pulse: 82  Resp: 20  Temp: 97.3 F (36.3 C)  Weight: 148 lb 12.8 oz (67.5 kg)  Height: 5' 2"  (1.575 m)   Body mass index is 27.22 kg/m. Physical  Exam  Constitutional: She is oriented to person, place, and time. She appears well-developed and well-nourished. No distress.  HENT:  Head: Normocephalic and atraumatic.  Right Ear: External ear normal.  Left Ear: External ear normal.  Nose: Nose normal.  Mouth/Throat: Oropharynx is clear and moist. No oropharyngeal exudate.  Left TM tube seen.   Eyes: Conjunctivae and EOM are normal. Pupils are equal, round, and reactive to light. Right eye exhibits no discharge. Left eye exhibits no discharge.  Neck: Normal range of motion. Neck supple. No JVD present. No thyromegaly present.  Cardiovascular: Exam reveals no gallop and no friction rub.   No murmur heard. Pulmonary/Chest: Effort normal. No respiratory distress. She has rales in the right lower field and the left lower field. She exhibits no tenderness.  Abdominal: Soft. Bowel sounds are normal. She exhibits no distension and no mass. There is no tenderness. There is no guarding. No hernia.  Musculoskeletal: Normal range of motion. She exhibits edema (pedal and ankles. ) and tenderness (c/o lower back and knees pain. ).  Chronic trace edema in ankles/feet and stasis dermatitis, erythema, scaly, and painful ankles and feet. R>L  Lymphadenopathy:    She has no cervical adenopathy.  Neurological: She is alert and oriented to person, place, and time. She displays normal reflexes. No cranial nerve deficit. Coordination normal.  Skin: Skin is warm and dry. No rash noted. She is not diaphoretic. No erythema. No pallor.  chronic edema in ankles and stasis dermatitis, erythema, scaly, and painful ankles and feet. Right sided head hematoma.   Psychiatric: She has a normal mood and affect. Her behavior is normal. Judgment and thought content normal.  Some confusion    Labs reviewed:  Recent Labs  11/04/15 0140 11/06/15 0545 11/19/15 05/04/16  NA 137 140 136* 141  K 3.9 3.7 4.7 4.2  CL 101 106  --   --   CO2 28 26  --   --   GLUCOSE 120* 81   --   --   BUN 28* 17 22* 26*  CREATININE 0.77 0.72 0.6 0.8  CALCIUM 8.9 8.8*  --   --     Recent Labs  11/04/15 0140 11/05/15 0530 11/06/15 0545 11/11/15 11/19/15 05/04/16  AST 171* 118* 89* 31 29 16   ALT 433* 298* 230* 88* 27 9  ALKPHOS 177* 144* 126 96 69 54  BILITOT 1.7* 1.1 1.3*  --   --   --  PROT 6.5 5.8* 5.9*  --   --   --   ALBUMIN 3.4* 3.2* 3.1*  --   --   --     Recent Labs  11/04/15 0015 05/04/16  WBC 7.9 6.1  HGB 15.1* 14.1  HCT 46.1* 43  MCV 92.6  --   PLT 177 157   Lab Results  Component Value Date   TSH 0.64 05/04/2016   No results found for: HGBA1C No results found for: CHOL, HDL, LDLCALC, LDLDIRECT, TRIG, CHOLHDL  Significant Diagnostic Results in last 30 days:  No results found.  Assessment/Plan HTN (hypertension) permissive blood pressure control. Continue Furosemide 58m, 05/04/16 Na 141, K 4.2, Bun 26, creat 0.82  Constipation No problem, continue Colace 2053mdaily.     Hypothyroidism Last TSH 0.64 05/04/16,  continueLevothyroxine 15055mdaily    Edema Minimal edema in ankles/feet, continue Furosemide 47m47mily, 05/04/16 Na 141, K 4.2, Bun 26, creat 0.82   Gait instability Continue to ambulates with walker, w/c to go further, risk falling.    Hematoma of scalp Continue neuro check, it should heal.      Family/ staff Communication: continue AL for care assistance.   Labs/tests ordered:  none

## 2016-09-15 NOTE — Assessment & Plan Note (Signed)
No problem, continue Colace 200mg daily.  

## 2016-10-13 ENCOUNTER — Encounter: Payer: Self-pay | Admitting: Nurse Practitioner

## 2016-10-13 ENCOUNTER — Non-Acute Institutional Stay: Payer: 59 | Admitting: Nurse Practitioner

## 2016-10-13 DIAGNOSIS — E039 Hypothyroidism, unspecified: Secondary | ICD-10-CM

## 2016-10-13 DIAGNOSIS — G5792 Unspecified mononeuropathy of left lower limb: Secondary | ICD-10-CM

## 2016-10-13 DIAGNOSIS — I10 Essential (primary) hypertension: Secondary | ICD-10-CM

## 2016-10-13 DIAGNOSIS — R609 Edema, unspecified: Secondary | ICD-10-CM | POA: Diagnosis not present

## 2016-10-13 DIAGNOSIS — J301 Allergic rhinitis due to pollen: Secondary | ICD-10-CM | POA: Diagnosis not present

## 2016-10-13 DIAGNOSIS — G5791 Unspecified mononeuropathy of right lower limb: Secondary | ICD-10-CM

## 2016-10-13 DIAGNOSIS — K5900 Constipation, unspecified: Secondary | ICD-10-CM

## 2016-10-13 DIAGNOSIS — R2681 Unsteadiness on feet: Secondary | ICD-10-CM | POA: Diagnosis not present

## 2016-10-13 DIAGNOSIS — M2550 Pain in unspecified joint: Secondary | ICD-10-CM

## 2016-10-13 DIAGNOSIS — G5793 Unspecified mononeuropathy of bilateral lower limbs: Secondary | ICD-10-CM

## 2016-10-13 NOTE — Progress Notes (Signed)
Location:  [place] Room Number: 073 Place of Service:  ALF [phone]) Provider:  Corbin Falck, Manxie  NP  Jeanmarie Hubert, MD  Patient Care Team: [name], MD as PCP - General (Internal Medicine) [name], MD (Internal Medicine) [place] Lakin Romer Otho Darner, NP as Nurse Practitioner (Nurse Practitioner) [place] Chauntae Hults Otho Darner, NP as Nurse Practitioner (Nurse Practitioner)  Extended Emergency Contact Information Primary Emergency Contact: [name] States of [place] Phone: [phone] Relation: Son  Code Status:  DNR Goals of care: Advanced Directive information Advanced Directives 10/13/2016  Does patient have an advance directive? Yes  Type of Paramedic of [place];Living will;Out of facility DNR (pink MOST or yellow form)  Does patient want to make changes to advanced directive? No - Patient declined  Copy of advanced directive(s) in chart? Yes  Would patient like information on creating an advanced directive? -  Pre-existing out of facility DNR order (yellow form or pink MOST form) -     Chief Complaint  Patient presents with  . Medical Management of Chronic Issues    HPI:  Pt is a 82 y.o. female seen today for medical management of chronic diseases.    Hx of thyroid, taking Levothyroxine 15mg, last TSH 0.64 05/04/16.Chronic pain back, legs, chronic, not well controlled, off Fentanyl 222m/hr, blood pressure is controlled with Furosemide 2028maily, Kcl 23m25mBurning sensation in feet, chronic edema BLE             Past Medical History:  Diagnosis Date  . Abnormal liver function tests    11/11/15 AST 31, ALT 88, alk phos 96   . Acute kidney failure, unspecified 01/24/2012  . Arthralgia 09/26/2014   Multiple joints: knees, shoulders, wrists, spine hips   . Arthritis   . Candidiasis of other urogenital sites 08/10/2012  . Cerebral embolism 05/23/2005  . CHF (congestive heart failure)  (HCC)[place]. Cholelithiasis 11/04/2015  . Closed fracture of sacrum and coccyx without mention of spinal cord injury 02/14/2012  . Constipation 05/03/2013  . Contusion of face, scalp, and neck except eye(s) 06/01/2012  . Contusion of wrist 06/01/2012  . COPD (chronic obstructive pulmonary disease) (HCC)[place]. COPD, mild (HCC)[place]8/2014  . Edema 04/20/2012  . H/O: CVA (cerebrovascular accident)   . Hearing loss 09/26/2014  . History of cancer of uterus   . HTN (hypertension), benign   . Hyperglycemia 11/14/2015  . Hypothyroidism   . Insomnia, unspecified 08/10/2012  . Open wound of knee, leg (except thigh), and ankle, without mention of complication 5/9/2/7/0350Other and unspecified hyperlipidemia 06/01/2012  . Other disorder of coccyx 01/27/2012  . Pain in joint, ankle and foot 06/14/2012  . Peripheral vascular disease, unspecified 01/27/2012  . Personal history of fall 01/27/2012  . Pneumonia, organism unspecified(486) 01/23/2005  . Rheumatic fever 09/26/1933   Age 81 10/01/14 ESR 13, RAF <10   . Seasonal allergies 05/03/2013  . Stasis dermatitis of both legs 09/26/2014  . Unspecified constipation 11/02/2012  . Unspecified hearing loss 02/08/2013  . Unspecified hereditary and idiopathic peripheral neuropathy 01/27/2012  . Urinary frequency 02/24/2014  . Ventricular fibrillation (HCC)[place]11/2013   Past Surgical History:  Procedure Laterality Date  . ABDOMINAL HYSTERECTOMY  1990   for endometrial cancer  . CATARACT EXTRACTION W/ INTRAOCULAR LENS  IMPLANT, BILATERAL    . [place]  1932  . MASTOIDECTOMY  1920   bilateral  . THYROIDECTOMY  1960  . TONSILLECTOMY  1916    Allergies  Allergen Reactions  . Amoxicillin     Unknown, patient unable to answer questionnaire   . Avelox [Moxifloxacin]     Unknown: listed on MAR  . Erythromycin     Unknown: listed on MAR  . Monistat [Miconazole]     Unknown: listed on MAR  . Morphine And Related     Unknown: listed  on MAR  . Orange Juice [Orange Oil]     Unknown: listed on [facility]      Medication List       Accurate as of 10/13/16  4:10 PM. Always use your most recent med list.          acetaminophen 325 MG tablet Commonly known as:  TYLENOL Take 650 mg by mouth every 4 (four) hours as needed for pain. Pain/headache   alum & mag hydroxide-simeth 200-200-20 MG/5ML suspension Commonly known as:  MAALOX/MYLANTA Take 30 mLs by mouth every 6 (six) hours as needed for indigestion or heartburn.   aspirin 81 MG chewable tablet Chew 81 mg by mouth daily.   B-complex with vitamin C tablet Take 1 tablet by mouth daily.   calcium citrate-vitamin D 315-200 MG-UNIT tablet Commonly known as:  CITRACAL+D Take 1 tablet by mouth daily.   clotrimazole-betamethasone cream Commonly known as:  LOTRISONE Apply 1 application topically 2 (two) times daily.   docusate sodium 100 MG capsule Commonly known as:  COLACE Take 200 mg by mouth daily.   EQL CENTURY MATURE Tabs Take 1 tablet by mouth daily.   fexofenadine 60 MG tablet Commonly known as:  ALLEGRA Take 60 mg by mouth daily.   furosemide 20 MG tablet Commonly known as:  LASIX Take 1 tablet by mouth daily.   levothyroxine 150 MCG tablet Commonly known as:  SYNTHROID, LEVOTHROID Take 1 tablet by mouth daily.   MILK OF MAGNESIA PO Take 30 mLs by mouth daily as needed.   nystatin-triamcinolone cream Commonly known as:  MYCOLOG II Apply 1 application topically 2 (two) times daily. Apply to sacral/coccyx area.   potassium chloride 10 MEQ tablet Commonly known as:  K-DUR,KLOR-CON Take 1 tablet by mouth daily.       Review of Systems  Constitutional: Negative for chills, diaphoresis and fever.  HENT: Positive for hearing loss (severe, f/u audiology, recently tube placed in the left ear drum,  hears better after impacted ceruen removed wtih ear lavage. ). Negative for congestion, ear discharge, ear pain and sore throat.   Eyes: Negative for  photophobia, pain, discharge and redness.  Respiratory: Positive for cough. Negative for shortness of breath.   Cardiovascular: Positive for leg swelling (chronic. The patient stated its better than prior. ). Negative for chest pain and palpitations.       Chronic trace edema in ankles and stasis dermatitis, erythema, scaly, and painful ankles and feet. R>L  Gastrointestinal: Negative for abdominal pain, blood in stool, constipation, diarrhea, nausea and vomiting.  Endocrine: Negative for polydipsia.  Genitourinary: Positive for frequency. Negative for dysuria, flank pain, hematuria and urgency.  Musculoskeletal: Positive for back pain. Negative for myalgias and neck pain.       Aches and pains in multiple sites.   Skin: Negative for rash.       Sacral coccygeal area excoriated and superficial stage II pressure ulcers. Right sided head hematoma  Allergic/Immunologic: Negative for environmental allergies.  Neurological: Negative for dizziness, tremors, seizures and  weakness.       Burning sensation in feet  Hematological: Does not bruise/bleed easily.  Psychiatric/Behavioral: Negative for hallucinations. The patient is not nervous/anxious.        Resolved visual hallucination after Cymbalta discontinued.    Immunization History  Administered Date(s) Administered  . Influenza Whole 10/19/2012  . Influenza-Unspecified 10/18/2013, 10/17/2014, 09/02/2015  . PPD Test 01/22/2013, 11/28/2015  . Pneumococcal Polysaccharide-23 01/22/2013  . Td 02/21/2009  . Tdap 04/07/2015   Pertinent  Health Maintenance Due  Topic Date Due  . DEXA SCAN  03/25/1978  . PNA vac Low Risk Adult (2 of 2 - PCV13) 01/22/2014  . INFLUENZA VACCINE  07/13/2016   Fall Risk  11/14/2015 04/17/2015 09/26/2014 09/26/2014  Falls in the past year? No Yes No Yes  Number falls in past yr: - 1 - 2 or more  Injury with Fall? - Yes - -  Risk Factor Category  - High Fall Risk - -  Risk for fall due to : Impaired mobility;Impaired  vision - - -   Functional Status Survey:    Vitals:   10/13/16 1339  BP: 138/64  Pulse: 80  Resp: 20  Temp: 98.4 F (36.9 C)  Weight: 148 lb 12.8 oz (67.5 kg)  Height: _0  (1.575 m)   Body mass index is 27.22 kg/m. Physical Exam  Constitutional: She is oriented to person, place, and time. She appears well-developed and well-nourished. No distress.  HENT:  Head: Normocephalic and atraumatic.  Right Ear: External ear normal.  Left Ear: External ear normal.  Nose: Nose normal.  Mouth/Throat: Oropharynx is clear and moist. No oropharyngeal exudate.  Left TM tube seen.   Eyes: Conjunctivae and EOM are normal. Pupils are equal, round, and reactive to light. Right eye exhibits no discharge. Left eye exhibits no discharge.  Neck: Normal range of motion. Neck supple. No JVD present. No thyromegaly present.  Cardiovascular: Exam reveals no gallop and no friction rub.   No murmur heard. Pulmonary/Chest: Effort normal. No respiratory distress. She has rales in the right lower field and the left lower field. She exhibits no tenderness.  Abdominal: Soft. Bowel sounds are normal. She exhibits no distension and no mass. There is no tenderness. There is no guarding. No hernia.  Musculoskeletal: Normal range of motion. She exhibits edema (pedal and ankles. ) and tenderness (c/o lower back and knees pain. ).  Chronic trace edema in ankles/feet and stasis dermatitis, erythema, scaly, and painful ankles and feet. R>L  Lymphadenopathy:    She has no cervical adenopathy.  Neurological: She is alert and oriented to person, place, and time. She displays normal reflexes. No cranial nerve deficit. Coordination normal.  Skin: Skin is warm and dry. No rash noted. She is not diaphoretic. No erythema. No pallor.  chronic edema in ankles and stasis dermatitis, erythema, scaly, and painful ankles and feet. Excoriated sacrum.   Psychiatric: She has a normal mood and affect. Her behavior is normal. Judgment  and thought content normal.  Some confusion    Labs reviewed:  Recent Labs  11/04/15 0140 11/06/15 0545 11/19/15 05/04/16  NA 137 140 136* 141  K 3.9 3.7 4.7 4.2  CL 101 106  --   --   CO2 28 26  --   --   GLUCOSE 120* 81  --   --   BUN 28* 17 22* 26*  CREATININE 0.77 0.72 0.6 0.8  CALCIUM 8.9 8.8*  --   --     Recent  Labs  11/04/15 0140 11/05/15 0530 11/06/15 0545 11/11/15 11/19/15 05/04/16  AST 171* 118* 89* _0 ALT 433* 298* 230* 88* 27 9  ALKPHOS 177* 144* 126 96 69 54  BILITOT 1.7* 1.1 1.3*  --   --   --   PROT 6.5 5.8* 5.9*  --   --   --   ALBUMIN 3.4* 3.2* 3.1*  --   --   --     Recent Labs  11/04/15 0015 05/04/16  WBC 7.9 6.1  HGB 15.1* 14.1  HCT 46.1* 43  MCV 92.6  --   PLT 177 157   Lab Results  Component Value Date   TSH 0.64 05/04/2016   No results found for: HGBA1C No results found for: CHOL, HDL, LDLCALC, LDLDIRECT, TRIG, CHOLHDL  Significant Diagnostic Results in last 30 days:  No results found.  Assessment/Plan HTN (hypertension) permissive blood pressure control. Continue Furosemide 36m, 05/04/16 Na 141, K 4.2, Bun 26, creat 0.82, update CBC CMP TSH   Seasonal allergies Stable, continue Allegra  Constipation Stable, continue Colace 205mdaily  Hypothyroidism Last TSH 0.64 05/04/16,  continueLevothyroxine 15049mdaily, update TSH    Edema Minimal edema in ankles/feet, continue Furosemide 33m62mily, 05/04/16 Na 141, K 4.2, Bun 26, creat 0.82 Update CMP  Gait instability Continue to ambulates with walker, w/c to go further, risk falling.   Arthralgia Multiple joints: knees, shoulders, wrists, spine hips, 09/06/16 Tylenol 650mg11m    Neuropathic pain of both legs Burning sensation in feet mainly.      Family/ staff Communication: continue AL for care assistance.   Labs/tests ordered:  CBC CMP TSH

## 2016-10-13 NOTE — Assessment & Plan Note (Signed)
Stable, continue Colace 200mg  daily

## 2016-10-13 NOTE — Assessment & Plan Note (Signed)
Multiple joints: knees, shoulders, wrists, spine hips, 09/06/16 Tylenol 650mg  qhs

## 2016-10-13 NOTE — Assessment & Plan Note (Signed)
permissive blood pressure control. Continue Furosemide 20mg , 05/04/16 Na 141, K 4.2, Bun 26, creat 0.82, update CBC CMP TSH

## 2016-10-13 NOTE — Assessment & Plan Note (Signed)
Stable, continue Allegra.  

## 2016-10-13 NOTE — Assessment & Plan Note (Signed)
Minimal edema in ankles/feet, continue Furosemide 20mg  daily, 05/04/16 Na 141, K 4.2, Bun 26, creat 0.82 Update CMP

## 2016-10-13 NOTE — Assessment & Plan Note (Signed)
Burning sensation in feet mainly.

## 2016-10-13 NOTE — Assessment & Plan Note (Signed)
Last TSH 0.64 05/04/16,  continueLevothyroxine 150mcg daily, update TSH

## 2016-10-13 NOTE — Assessment & Plan Note (Signed)
Continue to ambulates with walker, w/c to go further, risk falling.   

## 2016-10-21 ENCOUNTER — Other Ambulatory Visit: Payer: Self-pay | Admitting: *Deleted

## 2016-10-21 LAB — BASIC METABOLIC PANEL
BUN: 29 mg/dL — AB (ref 4–21)
Creatinine: 0.8 mg/dL (ref <=1.1)
Glucose: 100 mg/dL
Potassium: 4.5 mmol/L (ref 3.4–5.3)
Sodium: 138 mmol/L (ref 137–147)

## 2016-10-21 LAB — HEPATIC FUNCTION PANEL
ALK PHOS: 53 U/L (ref 25–125)
ALT: 11 U/L (ref 7–35)
AST: 24 U/L (ref 13–35)
BILIRUBIN, TOTAL: 0.5 mg/dL

## 2016-10-21 LAB — CBC AND DIFFERENTIAL
HEMATOCRIT: 45 % (ref 36–46)
Hemoglobin: 14.7 g/dL (ref 12.0–16.0)
PLATELETS: 182 10*3/uL (ref 150–399)
WBC: 7.3 10*3/mL

## 2016-10-21 LAB — TSH: TSH: 1.1 u[IU]/mL (ref <=5.90)

## 2016-11-03 ENCOUNTER — Non-Acute Institutional Stay: Payer: 59 | Admitting: Nurse Practitioner

## 2016-11-03 ENCOUNTER — Encounter: Payer: Self-pay | Admitting: Nurse Practitioner

## 2016-11-03 DIAGNOSIS — E039 Hypothyroidism, unspecified: Secondary | ICD-10-CM

## 2016-11-03 DIAGNOSIS — M2550 Pain in unspecified joint: Secondary | ICD-10-CM

## 2016-11-03 DIAGNOSIS — G5792 Unspecified mononeuropathy of left lower limb: Secondary | ICD-10-CM

## 2016-11-03 DIAGNOSIS — K5900 Constipation, unspecified: Secondary | ICD-10-CM

## 2016-11-03 DIAGNOSIS — R2681 Unsteadiness on feet: Secondary | ICD-10-CM

## 2016-11-03 DIAGNOSIS — I10 Essential (primary) hypertension: Secondary | ICD-10-CM

## 2016-11-03 DIAGNOSIS — S0003XA Contusion of scalp, initial encounter: Secondary | ICD-10-CM | POA: Diagnosis not present

## 2016-11-03 DIAGNOSIS — I872 Venous insufficiency (chronic) (peripheral): Secondary | ICD-10-CM

## 2016-11-03 DIAGNOSIS — G5791 Unspecified mononeuropathy of right lower limb: Secondary | ICD-10-CM

## 2016-11-03 DIAGNOSIS — G5793 Unspecified mononeuropathy of bilateral lower limbs: Secondary | ICD-10-CM

## 2016-11-03 NOTE — Assessment & Plan Note (Signed)
sustained a forehead hematoma, mechanical fall in nature. No new focal neurological symptoms noted.  Intensive supervision needed, abnormal gait and lack of safety awareness contributory.

## 2016-11-03 NOTE — Assessment & Plan Note (Signed)
Last TSH 1.1 10/21/16,  continueLevothyroxine 150mcg daily

## 2016-11-03 NOTE — Assessment & Plan Note (Signed)
Chronic, no open wounds.   

## 2016-11-03 NOTE — Assessment & Plan Note (Signed)
Burning sensation in feet mainly.  

## 2016-11-03 NOTE — Assessment & Plan Note (Addendum)
Multiple joints: knees, shoulders, wrists, spine hips, 09/06/16 Tylenol 650mg  qhs  10/31/16 X-ray R shoulder and ankle: no acute fracture or dislocation Pain in the right sided body, observe.

## 2016-11-03 NOTE — Assessment & Plan Note (Signed)
Stable, continue Colace 200mg daily 

## 2016-11-03 NOTE — Assessment & Plan Note (Addendum)
permissive blood pressure control. Continue Furosemide 20mg , 10/21/16 wbc 7.3, Hgb 14.7, plt 182, Na 138, K 4.5, Bun 29, creat 0.84, TSH 1.1

## 2016-11-03 NOTE — Assessment & Plan Note (Signed)
Minimal edema in ankles/feet, continue Furosemide 20mg  daily

## 2016-11-03 NOTE — Assessment & Plan Note (Signed)
Continue to ambulates with walker, w/c to go further, risk falling.

## 2016-11-03 NOTE — Progress Notes (Signed)
Location:  [place] Room Number: 962 Place of Service:  ALF [phone]) Provider:  Yoshimi Sarr, Manxie  NP  Jeanmarie Hubert, MD  Patient Care Team: [name], MD as PCP - General (Internal Medicine) [name], MD (Internal Medicine) [place] Rachal Dvorsky Otho Darner, NP as Nurse Practitioner (Nurse Practitioner) Allison Ayinde Swim Otho Darner, NP as Nurse Practitioner (Nurse Practitioner)  Extended Emergency Contact Information Primary Emergency Contact: [name] [place] of [place] Phone: [phone] Relation: Son  Code Status:  DNR Goals of care: Advanced Directive information Advanced Directives 11/03/2016  Does Patient Have a Medical Advance Directive? Yes  Type of Paramedic of [place];Living will;Out of facility DNR (pink MOST or yellow form)  Does patient want to make changes to medical advance directive? No - Patient declined  Copy of [place] in Chart? Yes  Would patient like information on creating a medical advance directive? -  Pre-existing out of facility DNR order (yellow form or pink MOST form) -     Chief Complaint  Patient presents with  . Acute Visit    [place] -hematoma to the forehead    HPI:  Pt is a 82 y.o. female seen today for an acute visit for sustained a right  forehead hematoma, mechanical fall in nature, c/o aches in the right sided body, no decreased ROM.  No new focal neurological symptoms noted.    Hx of thyroid, taking Levothyroxine 158mg, last TSH 1.1 10/21/16. Chronic pain back, legs, chronic, not well controlled, off Fentanyl 220m/hr, blood pressure is controlled with Furosemide 2040maily, Kcl 30m69mBurning sensation in feet, chronic edema BLE    Past Medical History:  Diagnosis Date  . Abnormal liver function tests    11/11/15 AST 31, ALT 88, alk phos 96   . Acute kidney failure, unspecified 01/24/2012  . Arthralgia 09/26/2014   Multiple joints:  knees, shoulders, wrists, spine hips   . Arthritis   . Candidiasis of other urogenital sites 08/10/2012  . Cerebral embolism 05/23/2005  . CHF (congestive heart failure) (HCC)[place]. Cholelithiasis 11/04/2015  . Closed fracture of sacrum and coccyx without mention of spinal cord injury 02/14/2012  . Constipation 05/03/2013  . Contusion of face, scalp, and neck except eye(s) 06/01/2012  . Contusion of wrist 06/01/2012  . COPD (chronic obstructive pulmonary disease) (HCC)[place]. COPD, mild (HCC)[place]8/2014  . Edema 04/20/2012  . H/O: CVA (cerebrovascular accident)   . Hearing loss 09/26/2014  . History of cancer of uterus   . HTN (hypertension), benign   . Hyperglycemia 11/14/2015  . Hypothyroidism   . Insomnia, unspecified 08/10/2012  . Open wound of knee, leg (except thigh), and ankle, without mention of complication 5/9/5/0/3546Other and unspecified hyperlipidemia 06/01/2012  . Other disorder of coccyx 01/27/2012  . Pain in joint, ankle and foot 06/14/2012  . Peripheral vascular disease, unspecified 01/27/2012  . Personal history of fall 01/27/2012  . Pneumonia, organism unspecified(486) 01/23/2005  . Rheumatic fever 09/26/1933   Age 50 10/01/14 ESR 13, RAF <10   . Seasonal allergies 05/03/2013  . Stasis dermatitis of both legs 09/26/2014  . Unspecified constipation 11/02/2012  . Unspecified hearing loss 02/08/2013  . Unspecified hereditary and idiopathic peripheral neuropathy 01/27/2012  . Urinary frequency 02/24/2014  . Ventricular fibrillation (HCC)[place]11/2013   Past Surgical History:  Procedure Laterality Date  . ABDOMINAL HYSTERECTOMY  1990   for endometrial cancer  .  CATARACT EXTRACTION W/ INTRAOCULAR LENS  IMPLANT, BILATERAL    . [place]  . GUM SURGERY  1932  . MASTOIDECTOMY  1920   bilateral  . THYROIDECTOMY  1960  . TONSILLECTOMY  1916    Allergies  Allergen Reactions  . Amoxicillin     Unknown, patient unable to answer questionnaire   . Avelox [Moxifloxacin]      Unknown: listed on MAR  . Erythromycin     Unknown: listed on MAR  . Monistat [Miconazole]     Unknown: listed on MAR  . Morphine And Related     Unknown: listed on MAR  . Orange Juice [Orange Oil]     Unknown: listed on [facility]      Medication List       Accurate as of 11/03/16  4:16 PM. Always use your most recent med list.          acetaminophen 325 MG tablet Commonly known as:  TYLENOL Take 650 mg by mouth every 4 (four) hours as needed for pain. Pain/headache   alum & mag hydroxide-simeth 200-200-20 MG/5ML suspension Commonly known as:  MAALOX/MYLANTA Take 30 mLs by mouth every 6 (six) hours as needed for indigestion or heartburn.   aspirin 81 MG chewable tablet Chew 81 mg by mouth daily.   B-complex with vitamin C tablet Take 1 tablet by mouth daily.   calcium citrate-vitamin D 315-200 MG-UNIT tablet Commonly known as:  CITRACAL+D Take 1 tablet by mouth daily.   clotrimazole-betamethasone cream Commonly known as:  LOTRISONE Apply 1 application topically 2 (two) times daily.   docusate sodium 100 MG capsule Commonly known as:  COLACE Take 200 mg by mouth daily.   EQL CENTURY MATURE Tabs Take 1 tablet by mouth daily.   fexofenadine 60 MG tablet Commonly known as:  ALLEGRA Take 60 mg by mouth daily.   furosemide 20 MG tablet Commonly known as:  LASIX Take 1 tablet by mouth daily.   levothyroxine 150 MCG tablet Commonly known as:  SYNTHROID, LEVOTHROID Take 1 tablet by mouth daily.   MILK OF MAGNESIA PO Take 30 mLs by mouth daily as needed.   nystatin-triamcinolone cream Commonly known as:  MYCOLOG II Apply 1 application topically 2 (two) times daily. Apply to sacral/coccyx area.   potassium chloride 10 MEQ tablet Commonly known as:  K-DUR,KLOR-CON Take 1 tablet by mouth daily.       Review of Systems  Constitutional: Negative for chills, diaphoresis and fever.  HENT: Positive for hearing loss (severe, f/u audiology, recently tube placed  in the left ear drum,  hears better after impacted ceruen removed wtih ear lavage. ). Negative for congestion, ear discharge, ear pain and sore throat.   Eyes: Negative for photophobia, pain, discharge and redness.  Respiratory: Positive for cough. Negative for shortness of breath.   Cardiovascular: Positive for leg swelling (chronic. The patient stated its better than prior. ). Negative for chest pain and palpitations.       Chronic trace edema in ankles and stasis dermatitis, erythema, scaly, and painful ankles and feet. R>L  Gastrointestinal: Negative for abdominal pain, blood in stool, constipation, diarrhea, nausea and vomiting.  Endocrine: Negative for polydipsia.  Genitourinary: Positive for frequency. Negative for dysuria, flank pain, hematuria and urgency.  Musculoskeletal: Positive for arthralgias and back pain. Negative for myalgias and neck pain.       Aches and pains in multiple sites. Pain in the right sided body since fall 10/31/16  Skin:  Negative for rash.       Sacral coccygeal area excoriated and superficial stage II pressure ulcers. Right sided head hematoma  Allergic/Immunologic: Negative for environmental allergies.  Neurological: Negative for dizziness, tremors, seizures and weakness.       Burning sensation in feet  Hematological: Does not bruise/bleed easily.  Psychiatric/Behavioral: Negative for hallucinations. The patient is not nervous/anxious.        Resolved visual hallucination after Cymbalta discontinued.    Immunization History  Administered Date(s) Administered  . Influenza Whole 10/19/2012  . Influenza-Unspecified 10/18/2013, 10/17/2014, 09/02/2015, 09/29/2016  . PPD Test 01/22/2013, 11/28/2015  . Pneumococcal Polysaccharide-23 01/22/2013  . Td 02/21/2009  . Tdap 04/07/2015   Pertinent  Health Maintenance Due  Topic Date Due  . DEXA SCAN  03/25/1978  . PNA vac Low Risk Adult (2 of 2 - PCV13) 01/22/2014  . INFLUENZA VACCINE  Completed   Fall Risk   11/14/2015 04/17/2015 09/26/2014 09/26/2014  Falls in the past year? No Yes No Yes  Number falls in past yr: - 1 - 2 or more  Injury with Fall? - Yes - -  Risk Factor Category  - High Fall Risk - -  Risk for fall due to : Impaired mobility;Impaired vision - - -   Functional Status Survey:    Vitals:   11/03/16 1328  BP: (!) 144/74  Pulse: 62  Resp: 20  Temp: 97.3 F (36.3 C)  Weight: 150 lb 3.2 oz (68.1 kg)  Height: 5' 2"  (1.575 m)   Body mass index is 27.47 kg/m. Physical Exam  Constitutional: She is oriented to person, place, and time. She appears well-developed and well-nourished. No distress.  HENT:  Head: Normocephalic and atraumatic.  Right Ear: External ear normal.  Left Ear: External ear normal.  Nose: Nose normal.  Mouth/Throat: Oropharynx is clear and moist. No oropharyngeal exudate.  Left TM tube seen.   Eyes: Conjunctivae and EOM are normal. Pupils are equal, round, and reactive to light. Right eye exhibits no discharge. Left eye exhibits no discharge.  Neck: Normal range of motion. Neck supple. No JVD present. No thyromegaly present.  Cardiovascular: Exam reveals no gallop and no friction rub.   No murmur heard. Pulmonary/Chest: Effort normal. No respiratory distress. She has rales in the right lower field and the left lower field. She exhibits no tenderness.  Abdominal: Soft. Bowel sounds are normal. She exhibits no distension and no mass. There is no tenderness. There is no guarding. No hernia.  Musculoskeletal: Normal range of motion. She exhibits edema (pedal and ankles. ) and tenderness (c/o lower back and knees pain. ).  Chronic trace edema in ankles/feet and stasis dermatitis, erythema, scaly, and painful ankles and feet. R>L  Lymphadenopathy:    She has no cervical adenopathy.  Neurological: She is alert and oriented to person, place, and time. She displays normal reflexes. No cranial nerve deficit. Coordination normal.  Skin: Skin is warm and dry. No rash  noted. She is not diaphoretic. No erythema. No pallor.  chronic edema in ankles and stasis dermatitis, erythema, scaly, and painful ankles and feet. Excoriated sacrum.   Psychiatric: She has a normal mood and affect. Her behavior is normal. Judgment and thought content normal.  Some confusion    Labs reviewed:  Recent Labs  11/06/15 0545 11/19/15 05/04/16 10/21/16  NA 140 136* 141 138  K 3.7 4.7 4.2 4.5  CL 106  --   --   --   CO2 26  --   --   --  GLUCOSE 81  --   --   --   BUN 17 22* 26* 29*  CREATININE 0.72 0.6 0.8 0.8  CALCIUM 8.8*  --   --   --     Recent Labs  11/05/15 0530 11/06/15 0545  11/19/15 05/04/16 10/21/16  AST 118* 89*  < > 29 16 24   ALT 298* 230*  < > 27 9 11   ALKPHOS 144* 126  < > 69 54 53  BILITOT 1.1 1.3*  --   --   --   --   PROT 5.8* 5.9*  --   --   --   --   ALBUMIN 3.2* 3.1*  --   --   --   --   < > = values in this interval not displayed.  Recent Labs  05/04/16 10/21/16  WBC 6.1 7.3  HGB 14.1 14.7  HCT 43 45  PLT 157 182   Lab Results  Component Value Date   TSH 1.10 10/21/2016   No results found for: HGBA1C No results found for: CHOL, HDL, LDLCALC, LDLDIRECT, TRIG, CHOLHDL  Significant Diagnostic Results in last 30 days:  No results found.  Assessment/Plan Hematoma of scalp sustained a forehead hematoma, mechanical fall in nature. No new focal neurological symptoms noted.  Intensive supervision needed, abnormal gait and lack of safety awareness contributory.   HTN (hypertension) permissive blood pressure control. Continue Furosemide 59m, 10/21/16 wbc 7.3, Hgb 14.7, plt 182, Na 138, K 4.5, Bun 29, creat 0.84, TSH 1.1    Constipation Stable, continue Colace 2018mdaily   Hypothyroidism Last TSH 1.1 10/21/16,  continueLevothyroxine 15041mdaily  Stasis dermatitis of both legs Chronic, no open wounds  Edema Minimal edema in ankles/feet, continue Furosemide 66m31mily  Gait instability Continue to ambulates with walker,  w/c to go further, risk falling.    Arthralgia Multiple joints: knees, shoulders, wrists, spine hips, 09/06/16 Tylenol 650mg93m  10/31/16 X-ray R shoulder and ankle: no acute fracture or dislocation Pain in the right sided body, observe.    Neuropathic pain of both legs Burning sensation in feet mainly.    Family/ staff Communication: AL  Labs/tests ordered: 10/31/16 X-ray R shoulder and ankle: no acute fracture or dislocation

## 2016-11-29 ENCOUNTER — Encounter: Payer: Self-pay | Admitting: Internal Medicine

## 2016-11-29 ENCOUNTER — Non-Acute Institutional Stay: Payer: 59 | Admitting: Internal Medicine

## 2016-11-29 DIAGNOSIS — E039 Hypothyroidism, unspecified: Secondary | ICD-10-CM

## 2016-11-29 DIAGNOSIS — M2550 Pain in unspecified joint: Secondary | ICD-10-CM

## 2016-11-29 DIAGNOSIS — R739 Hyperglycemia, unspecified: Secondary | ICD-10-CM | POA: Diagnosis not present

## 2016-11-29 DIAGNOSIS — I10 Essential (primary) hypertension: Secondary | ICD-10-CM

## 2016-11-29 DIAGNOSIS — S0003XA Contusion of scalp, initial encounter: Secondary | ICD-10-CM | POA: Diagnosis not present

## 2016-11-29 DIAGNOSIS — R609 Edema, unspecified: Secondary | ICD-10-CM

## 2016-11-29 NOTE — Progress Notes (Signed)
Progress Note    Location:  [place] Room Number: [phone] Place of Service:  ALF [phone]) Provider:  Jeanmarie Hubert, MD  Patient Care Team: [name], MD as PCP - General (Internal Medicine) [name], MD (Internal Medicine) [place] Man Otho Darner, NP as Nurse Practitioner (Nurse Practitioner) [place] Man Otho Darner, NP as Nurse Practitioner (Nurse Practitioner)  Extended Emergency Contact Information Primary Emergency Contact: [name] States of [place] Phone: [phone] Relation: Son  Code Status:  DNR Goals of care: Advanced Directive information Advanced Directives 11/29/2016  Does Patient Have a Medical Advance Directive? Yes  Type of Paramedic of [place];Living will;Out of facility DNR (pink MOST or yellow form)  Does patient want to make changes to medical advance directive? -  Copy of [place] in Chart? Yes  Would patient like information on creating a medical advance directive? -  Pre-existing out of facility DNR order (yellow form or pink MOST form) Yellow form placed in chart (order not valid for inpatient use)     Chief Complaint  Patient presents with  . Medical Management of Chronic Issues    routine visit    HPI:  Pt is a 82 y.o. female seen today for medical management of chronic diseases.    Edema, unspecified type - unchanged. About 2+ at both lower extremities.  Essential hypertension - controlled  Hyperglycemia - controlled  Hypothyroidism, unspecified type - compensated  Arthralgia, unspecified joint - chronic and migratory. Currently, she thinks her pains are controlled  Hematoma of scalp, initial encounter - occurred at a fall in late Nov 2017. Resolved.     Past Medical History:  Diagnosis Date  . Abnormal liver function tests    11/11/15 AST 31, ALT 88, alk phos 96   . Acute kidney failure, unspecified 01/24/2012    . Arthralgia 09/26/2014   Multiple joints: knees, shoulders, wrists, spine hips   . Arthritis   . Candidiasis of other urogenital sites 08/10/2012  . Cerebral embolism 05/23/2005  . CHF (congestive heart failure) ([place])   . Cholelithiasis 11/04/2015  . Closed fracture of sacrum and coccyx without mention of spinal cord injury 02/14/2012  . Constipation 05/03/2013  . Contusion of face, scalp, and neck except eye(s) 06/01/2012  . Contusion of wrist 06/01/2012  . COPD (chronic obstructive pulmonary disease) ([place])   . COPD, mild ([place]) 01/20/2013  . Edema 04/20/2012  . H/O: CVA (cerebrovascular accident)   . Hearing loss 09/26/2014  . History of cancer of uterus   . HTN (hypertension), benign   . Hyperglycemia 11/14/2015  . Hypothyroidism   . Insomnia, unspecified 08/10/2012  . Open wound of knee, leg (except thigh), and ankle, without mention of complication 12/17/4006  . Other and unspecified hyperlipidemia 06/01/2012  . Other disorder of coccyx 01/27/2012  . Pain in joint, ankle and foot 06/14/2012  . Peripheral vascular disease, unspecified 01/27/2012  . Personal history of fall 01/27/2012  . Pneumonia, organism unspecified(486) 01/23/2005  . Rheumatic fever 09/26/1933   Age 62 10/01/14 ESR 13, RAF <10   . Seasonal allergies 05/03/2013  . Stasis dermatitis of both legs 09/26/2014  . Unspecified constipation 11/02/2012  . Unspecified hearing loss 02/08/2013  . Unspecified hereditary and idiopathic peripheral neuropathy 01/27/2012  . Urinary frequency 02/24/2014  . Ventricular fibrillation ([place]) 01/24/2012   Past Surgical History:  Procedure Laterality Date  . ABDOMINAL HYSTERECTOMY  1990   for  endometrial cancer  . CATARACT EXTRACTION W/ INTRAOCULAR LENS  IMPLANT, BILATERAL    . [place]  . GUM SURGERY  1932  . MASTOIDECTOMY  1920   bilateral  . THYROIDECTOMY  1960  . TONSILLECTOMY  1916    Allergies  Allergen Reactions  . Amoxicillin     Unknown, patient unable to  answer questionnaire   . Avelox [Moxifloxacin]     Unknown: listed on MAR  . Erythromycin     Unknown: listed on MAR  . Monistat [Miconazole]     Unknown: listed on MAR  . Morphine And Related     Unknown: listed on MAR  . Orange Juice [Orange Oil]     Unknown: listed on MAR    Allergies as of 11/29/2016      Reactions   Amoxicillin    Unknown, patient unable to answer questionnaire    Avelox [moxifloxacin]    Unknown: listed on MAR   Erythromycin    Unknown: listed on MAR   Monistat [miconazole]    Unknown: listed on MAR   Morphine And Related    Unknown: listed on MAR   Orange Juice [orange Oil]    Unknown: listed on [facility]      Medication List       Accurate as of 11/29/16  4:10 PM. Always use your most recent med list.          acetaminophen 325 MG tablet Commonly known as:  TYLENOL Take 650 mg by mouth. Take 2 tablets once a day 10:00 pm. Take 2 tablets every 4 hours as needed for pain or headache   alum & mag hydroxide-simeth 200-200-20 MG/5ML suspension Commonly known as:  MAALOX/MYLANTA Take 30 mLs by mouth every 6 (six) hours as needed for indigestion or heartburn.   aspirin 81 MG chewable tablet Chew 81 mg by mouth daily.   B-complex with vitamin C tablet Take 1 tablet by mouth daily.   calcium citrate-vitamin D 315-200 MG-UNIT tablet Commonly known as:  CITRACAL+D Take 1 tablet by mouth daily.   clotrimazole-betamethasone cream Commonly known as:  LOTRISONE Apply 1 application topically 2 (two) times daily.   docusate sodium 100 MG capsule Commonly known as:  COLACE Take 200 mg by mouth daily.   EQL CENTURY MATURE Tabs Take 1 tablet by mouth daily.   fexofenadine 60 MG tablet Commonly known as:  ALLEGRA Take 60 mg by mouth daily.   furosemide 20 MG tablet Commonly known as:  LASIX Take 1 tablet by mouth daily.   levothyroxine 150 MCG tablet Commonly known as:  SYNTHROID, LEVOTHROID Take 1 tablet by mouth daily.   MILK OF MAGNESIA  PO Take 30 mLs by mouth daily as needed.   nystatin-triamcinolone cream Commonly known as:  MYCOLOG II Apply 1 application topically 2 (two) times daily. Apply to sacral/coccyx area.   potassium chloride 10 MEQ tablet Commonly known as:  K-DUR,KLOR-CON Take 1 tablet by mouth daily.       Review of Systems  Constitutional: Negative for chills, diaphoresis and fever.  HENT: Positive for hearing loss (severe, f/u audiology, recently tube placed in the left ear drum,  hears better after impacted ceruen removed wtih ear lavage. ). Negative for congestion, ear discharge, ear pain and sore throat.   Eyes: Negative for photophobia, pain, discharge and redness.  Respiratory: Positive for cough. Negative for shortness of breath.   Cardiovascular: Positive for leg swelling (chronic. The patient stated its better than prior. ).  Negative for chest pain and palpitations.       Chronic trace edema in ankles and stasis dermatitis, erythema, scaly, and painful ankles and feet. R>L  Gastrointestinal: Negative for abdominal pain, blood in stool, constipation, diarrhea, nausea and vomiting.  Endocrine: Negative for polydipsia.  Genitourinary: Positive for frequency. Negative for dysuria, flank pain, hematuria and urgency.  Musculoskeletal: Positive for arthralgias and back pain. Negative for myalgias and neck pain.       Aches and pains in multiple sites. Hx fall 10/31/16.  Skin: Negative for rash.       Hx of Sacral coccygeal area excoriated and superficial stage II pressure ulcers.  Allergic/Immunologic: Negative for environmental allergies.  Neurological: Negative for dizziness, tremors, seizures and weakness.       Burning sensation in feet  Hematological: Does not bruise/bleed easily.  Psychiatric/Behavioral: Negative for hallucinations. The patient is not nervous/anxious.        Resolved visual hallucination after Cymbalta discontinued.    Immunization History  Administered Date(s)  Administered  . Influenza Whole 10/19/2012  . Influenza-Unspecified 10/18/2013, 10/17/2014, 09/02/2015, 09/29/2016  . PPD Test 01/22/2013, 11/28/2015  . Pneumococcal Polysaccharide-23 01/22/2013  . Td 02/21/2009  . Tdap 04/07/2015   Pertinent  Health Maintenance Due  Topic Date Due  . DEXA SCAN  03/25/1978  . PNA vac Low Risk Adult (2 of 2 - PCV13) 01/22/2014  . INFLUENZA VACCINE  Completed   Fall Risk  11/14/2015 04/17/2015 09/26/2014 09/26/2014  Falls in the past year? No Yes No Yes  Number falls in past yr: - 1 - 2 or more  Injury with Fall? - Yes - -  Risk Factor Category  - High Fall Risk - -  Risk for fall due to : Impaired mobility;Impaired vision - - -   Functional Status Survey:    Vitals:   11/29/16 1601  BP: 126/72  Pulse: 77  Resp: 20  Temp: 97.7 F (36.5 C)  SpO2: 93%  Weight: 149 lb (67.6 kg)  Height: 5' (1.524 m)   Body mass index is 29.1 kg/m. Physical Exam  Constitutional: She is oriented to person, place, and time. She appears well-developed and well-nourished. No distress.  HENT:  Head: Normocephalic and atraumatic.  Right Ear: External ear normal.  Left Ear: External ear normal.  Nose: Nose normal.  Mouth/Throat: Oropharynx is clear and moist. No oropharyngeal exudate.  Left TM tube seen.   Eyes: Conjunctivae and EOM are normal. Pupils are equal, round, and reactive to light. Right eye exhibits no discharge. Left eye exhibits no discharge.  Neck: Normal range of motion. Neck supple. No JVD present. No thyromegaly present.  Cardiovascular: Exam reveals no gallop and no friction rub.   No murmur heard. Pulmonary/Chest: Effort normal. No respiratory distress. She has rales in the right lower field and the left lower field. She exhibits no tenderness.  Abdominal: Soft. Bowel sounds are normal. She exhibits no distension and no mass. There is no tenderness. There is no guarding. No hernia.  Musculoskeletal: Normal range of motion. She exhibits edema  (pedal and ankles. ) and tenderness (c/o lower back and knees pain. ).  Chronic trace edema in ankles/feet and stasis dermatitis, erythema, scaly, and painful ankles and feet. R>L  Lymphadenopathy:    She has no cervical adenopathy.  Neurological: She is alert and oriented to person, place, and time. She displays normal reflexes. No cranial nerve deficit. Coordination normal.  Skin: Skin is warm and dry. No rash noted. She is not  diaphoretic. No erythema. No pallor.  chronic edema in ankles and stasis dermatitis, erythema, scaly, and painful ankles and feet. Excoriated sacrum.   Psychiatric: She has a normal mood and affect. Her behavior is normal. Judgment and thought content normal.  Some confusion    Labs reviewed:  Recent Labs  05/04/16 10/21/16  NA 141 138  K 4.2 4.5  BUN 26* 29*  CREATININE 0.8 0.8    Recent Labs  05/04/16 10/21/16  AST 16 24  ALT 9 11  ALKPHOS 54 53    Recent Labs  05/04/16 10/21/16  WBC 6.1 7.3  HGB 14.1 14.7  HCT 43 45  PLT 157 182   Lab Results  Component Value Date   TSH 1.10 10/21/2016    Assessment/Plan 1. Edema, unspecified type controlled  2. Essential hypertension controlled  3. Hyperglycemia The current medical regimen is effective;  continue present plan and medications.  4. Hypothyroidism, unspecified type The current medical regimen is effective;  continue present plan and medications.  5. Arthralgia, unspecified joint Satisfactory control  6. Hematoma of scalp, initial encounter .resolved

## 2017-01-06 ENCOUNTER — Non-Acute Institutional Stay: Payer: 59 | Admitting: Nurse Practitioner

## 2017-01-06 ENCOUNTER — Encounter: Payer: Self-pay | Admitting: Nurse Practitioner

## 2017-01-06 DIAGNOSIS — I10 Essential (primary) hypertension: Secondary | ICD-10-CM | POA: Diagnosis not present

## 2017-01-06 DIAGNOSIS — E039 Hypothyroidism, unspecified: Secondary | ICD-10-CM

## 2017-01-06 DIAGNOSIS — J449 Chronic obstructive pulmonary disease, unspecified: Secondary | ICD-10-CM | POA: Diagnosis not present

## 2017-01-06 DIAGNOSIS — K5900 Constipation, unspecified: Secondary | ICD-10-CM

## 2017-01-06 DIAGNOSIS — M2550 Pain in unspecified joint: Secondary | ICD-10-CM

## 2017-01-06 DIAGNOSIS — J301 Allergic rhinitis due to pollen: Secondary | ICD-10-CM | POA: Diagnosis not present

## 2017-01-06 NOTE — Assessment & Plan Note (Signed)
Multiple joints: knees, shoulders, wrists, spine hips, Tylenol   10/31/16 X-ray R shoulder and ankle: no acute fracture or dislocation Pain in the right sided body, observe.

## 2017-01-06 NOTE — Progress Notes (Signed)
Location:  [place] Room Number: 546 Place of Service:  ALF [phone]) Provider:  Jennipher Weatherholtz, Manxie  NP  Jeanmarie Hubert, MD  Patient Care Team: [name], MD as PCP - General (Internal Medicine) [name], MD (Internal Medicine) [place] Pyper Olexa Otho Darner, NP as Nurse Practitioner (Nurse Practitioner) [place] Nayel Purdy Otho Darner, NP as Nurse Practitioner (Nurse Practitioner)  Extended Emergency Contact Information Primary Emergency Contact: [name] [place] of [place] Phone: [phone] Relation: Son  Code Status:  DNR Goals of care: Advanced Directive information Advanced Directives 01/06/2017  Does Patient Have a Medical Advance Directive? Yes  Type of Paramedic of [place];Living will;Out of facility DNR (pink MOST or yellow form)  Does patient want to make changes to medical advance directive? No - Patient declined  Copy of [place] in Chart? Yes  Would patient like information on creating a medical advance directive? -  Pre-existing out of facility DNR order (yellow form or pink MOST form) Yellow form placed in chart (order not valid for inpatient use)     Chief Complaint  Patient presents with  . Medical Management of Chronic Issues    HPI:  Pt is a 83 y.o. female seen today for medical management of chronic diseases.     Hx of thyroid, taking Levothyroxine 159mg, last TSH 1.1 10/21/16. Chronic pain back, legs, chronic, not well controlled, off Fentanyl 279m/hr, blood pressure is controlled with Furosemide 2041maily, Kcl 8m6mBurning sensation in feet, chronic edema BLE       Past Medical History:  Diagnosis Date  . Abnormal liver function tests    11/11/15 AST 31, ALT 88, alk phos 96   . Acute kidney failure, unspecified 01/24/2012  . Arthralgia 09/26/2014   Multiple joints: knees, shoulders, wrists, spine hips   . Arthritis   . Candidiasis of other  urogenital sites 08/10/2012  . Cerebral embolism 05/23/2005  . CHF (congestive heart failure) (HCC)[place]. Cholelithiasis 11/04/2015  . Closed fracture of sacrum and coccyx without mention of spinal cord injury 02/14/2012  . Constipation 05/03/2013  . Contusion of face, scalp, and neck except eye(s) 06/01/2012  . Contusion of wrist 06/01/2012  . COPD (chronic obstructive pulmonary disease) (HCC)[place]. COPD, mild (HCC)[place]8/2014  . Edema 04/20/2012  . H/O: CVA (cerebrovascular accident)   . Hearing loss 09/26/2014  . History of cancer of uterus   . HTN (hypertension), benign   . Hyperglycemia 11/14/2015  . Hypothyroidism   . Insomnia, unspecified 08/10/2012  . Open wound of knee, leg (except thigh), and ankle, without mention of complication 5/9/2/9/9371Other and unspecified hyperlipidemia 06/01/2012  . Other disorder of coccyx 01/27/2012  . Pain in joint, ankle and foot 06/14/2012  . Peripheral vascular disease, unspecified 01/27/2012  . Personal history of fall 01/27/2012  . Pneumonia, organism unspecified(486) 01/23/2005  . Rheumatic fever 09/26/1933   Age 64 10/01/14 ESR 13, RAF <10   . Seasonal allergies 05/03/2013  . Stasis dermatitis of both legs 09/26/2014  . Unspecified constipation 11/02/2012  . Unspecified hearing loss 02/08/2013  . Unspecified hereditary and idiopathic peripheral neuropathy 01/27/2012  . Urinary frequency 02/24/2014  . Ventricular fibrillation (HCC)[place]11/2013   Past Surgical History:  Procedure Laterality Date  . ABDOMINAL HYSTERECTOMY  1990   for endometrial cancer  . CATARACT EXTRACTION W/ INTRAOCULAR LENS  IMPLANT, BILATERAL    . ECTOPIC PREGNANCY SURGERY  [place]  . MASTOIDECTOMY  1920   bilateral  . THYROIDECTOMY  1960  . TONSILLECTOMY  1916    Allergies  Allergen Reactions  . Amoxicillin     Unknown, patient unable to answer questionnaire   . Avelox [Moxifloxacin]     Unknown: listed on MAR  . Erythromycin     Unknown: listed on MAR  .  Monistat [Miconazole]     Unknown: listed on MAR  . Morphine And Related     Unknown: listed on MAR  . Orange Juice [Orange Oil]     Unknown: listed on MAR    Allergies as of 01/06/2017      Reactions   Amoxicillin    Unknown, patient unable to answer questionnaire    Avelox [moxifloxacin]    Unknown: listed on MAR   Erythromycin    Unknown: listed on MAR   Monistat [miconazole]    Unknown: listed on MAR   Morphine And Related    Unknown: listed on MAR   Orange Juice [orange Oil]    Unknown: listed on MAR      Medication List       Accurate as of 01/06/17 11:59 PM. Always use your most recent med list.          acetaminophen 325 MG tablet Commonly known as:  TYLENOL Take 650 mg by mouth. Take 2 tablets once a day 10:00 pm. Take 2 tablets every 4 hours as needed for pain or headache   alum & mag hydroxide-simeth 200-200-20 MG/5ML suspension Commonly known as:  MAALOX/MYLANTA Take 30 mLs by mouth every 6 (six) hours as needed for indigestion or heartburn.   aspirin 81 MG chewable tablet Chew 81 mg by mouth daily.   B-complex with vitamin C tablet Take 1 tablet by mouth daily.   calcium citrate-vitamin D 315-200 MG-UNIT tablet Commonly known as:  CITRACAL+D Take 1 tablet by mouth daily.   clotrimazole-betamethasone cream Commonly known as:  LOTRISONE Apply 1 application topically 2 (two) times daily.   docusate sodium 100 MG capsule Commonly known as:  COLACE Take 200 mg by mouth daily.   EQL CENTURY MATURE Tabs Take 1 tablet by mouth daily.   fexofenadine 60 MG tablet Commonly known as:  ALLEGRA Take 60 mg by mouth daily.   furosemide 20 MG tablet Commonly known as:  LASIX Take 1 tablet by mouth daily.   levothyroxine 150 MCG tablet Commonly known as:  SYNTHROID, LEVOTHROID Take 1 tablet by mouth daily.   MILK OF MAGNESIA PO Take 30 mLs by mouth daily as needed.   nystatin-triamcinolone cream Commonly known as:  MYCOLOG II Apply 1 application  topically 2 (two) times daily. Apply to sacral/coccyx area.   potassium chloride 10 MEQ tablet Commonly known as:  K-DUR,KLOR-CON Take 1 tablet by mouth daily.       Review of Systems  Constitutional: Negative for chills, diaphoresis and fever.  HENT: Positive for hearing loss (severe, f/u audiology, recently tube placed in the left ear drum,  hears better after impacted ceruen removed wtih ear lavage. ). Negative for congestion, ear discharge, ear pain and sore throat.   Eyes: Negative for photophobia, pain, discharge and redness.  Respiratory: Positive for cough. Negative for shortness of breath.   Cardiovascular: Positive for leg swelling (chronic. The patient stated its better than prior. ). Negative for chest pain and palpitations.       Chronic trace edema in ankles and stasis dermatitis,  erythema, scaly, and painful ankles and feet. R>L  Gastrointestinal: Negative for abdominal pain, blood in stool, constipation, diarrhea, nausea and vomiting.  Endocrine: Negative for polydipsia.  Genitourinary: Positive for frequency. Negative for dysuria, flank pain, hematuria and urgency.  Musculoskeletal: Positive for arthralgias and back pain. Negative for myalgias and neck pain.       Aches and pains in multiple sites. Hx fall 10/31/16.  Skin: Negative for rash.       Hx of Sacral coccygeal area excoriated and superficial stage II pressure ulcers.  Allergic/Immunologic: Negative for environmental allergies.  Neurological: Negative for dizziness, tremors, seizures and weakness.       Burning sensation in feet  Hematological: Does not bruise/bleed easily.  Psychiatric/Behavioral: Negative for hallucinations. The patient is not nervous/anxious.        Resolved visual hallucination after Cymbalta discontinued.    Immunization History  Administered Date(s) Administered  . Influenza Whole 10/19/2012  . Influenza-Unspecified 10/18/2013, 10/17/2014, 09/02/2015, 09/29/2016  . PPD Test  01/22/2013, 11/28/2015  . Pneumococcal Polysaccharide-23 01/22/2013  . Td 02/21/2009  . Tdap 04/07/2015   Pertinent  Health Maintenance Due  Topic Date Due  . DEXA SCAN  03/25/1978  . PNA vac Low Risk Adult (2 of 2 - PCV13) 01/22/2014  . INFLUENZA VACCINE  Completed   Fall Risk  11/14/2015 04/17/2015 09/26/2014 09/26/2014  Falls in the past year? No Yes No Yes  Number falls in past yr: - 1 - 2 or more  Injury with Fall? - Yes - -  Risk Factor Category  - High Fall Risk - -  Risk for fall due to : Impaired mobility;Impaired vision - - -   Functional Status Survey:    Vitals:   01/06/17 1114  BP: 132/76  Pulse: 78  Resp: 18  Temp: 97.5 F (36.4 C)  Weight: 149 lb (67.6 kg)  Height: 5' (1.524 m)   Body mass index is 29.1 kg/m. Physical Exam  Constitutional: She is oriented to person, place, and time. She appears well-developed and well-nourished. No distress.  HENT:  Head: Normocephalic and atraumatic.  Right Ear: External ear normal.  Left Ear: External ear normal.  Nose: Nose normal.  Mouth/Throat: Oropharynx is clear and moist. No oropharyngeal exudate.  Left TM tube seen.   Eyes: Conjunctivae and EOM are normal. Pupils are equal, round, and reactive to light. Right eye exhibits no discharge. Left eye exhibits no discharge.  Neck: Normal range of motion. Neck supple. No JVD present. No thyromegaly present.  Cardiovascular: Exam reveals no gallop and no friction rub.   No murmur heard. Pulmonary/Chest: Effort normal. No respiratory distress. She has rales in the right lower field and the left lower field. She exhibits no tenderness.  Abdominal: Soft. Bowel sounds are normal. She exhibits no distension and no mass. There is no tenderness. There is no guarding. No hernia.  Musculoskeletal: Normal range of motion. She exhibits edema (pedal and ankles. ) and tenderness (c/o lower back and knees pain. ).  Chronic trace edema in ankles/feet and stasis dermatitis, erythema,  scaly, and painful ankles and feet. R>L  Lymphadenopathy:    She has no cervical adenopathy.  Neurological: She is alert and oriented to person, place, and time. She displays normal reflexes. No cranial nerve deficit. Coordination normal.  Skin: Skin is warm and dry. No rash noted. She is not diaphoretic. No erythema. No pallor.  chronic edema in ankles and stasis dermatitis, erythema, scaly, and painful ankles and feet. Excoriated R+L buttocks.  Psychiatric: She has a normal mood and affect. Her behavior is normal. Judgment and thought content normal.  Some confusion    Labs reviewed:  Recent Labs  05/04/16 10/21/16  NA 141 138  K 4.2 4.5  BUN 26* 29*  CREATININE 0.8 0.8    Recent Labs  05/04/16 10/21/16  AST 16 24  ALT 9 11  ALKPHOS 54 53    Recent Labs  05/04/16 10/21/16  WBC 6.1 7.3  HGB 14.1 14.7  HCT 43 45  PLT 157 182   Lab Results  Component Value Date   TSH 1.10 10/21/2016   No results found for: HGBA1C No results found for: CHOL, HDL, LDLCALC, LDLDIRECT, TRIG, CHOLHDL  Significant Diagnostic Results in last 30 days:  No results found.  Assessment/Plan HTN (hypertension) permissive blood pressure control. Continue Furosemide 38m, 10/21/16 wbc 7.3, Hgb 14.7, plt 182, Na 138, K 4.5, Bun 29, creat 0.84, TSH 1.1    COPD, mild (HCC) Stable.   Seasonal allergies Stable, continue Allegra.   Constipation Stable, continue Colace 2044mdaily, MOM prn   Hypothyroidism Last TSH 1.1 10/21/16,  continueLevothyroxine 15072mdaily  Arthralgia Multiple joints: knees, shoulders, wrists, spine hips, Tylenol   10/31/16 X-ray R shoulder and ankle: no acute fracture or dislocation Pain in the right sided body, observe.       Family/ staff Communication: AL  Labs/tests ordered:  none

## 2017-01-06 NOTE — Assessment & Plan Note (Signed)
permissive blood pressure control. Continue Furosemide 20mg, 10/21/16 wbc 7.3, Hgb 14.7, plt 182, Na 138, K 4.5, Bun 29, creat 0.84, TSH 1.1   

## 2017-01-06 NOTE — Assessment & Plan Note (Signed)
Stable, continue Colace 200mg  daily, MOM prn

## 2017-01-06 NOTE — Assessment & Plan Note (Signed)
Stable

## 2017-01-06 NOTE — Assessment & Plan Note (Signed)
Stable, continue Allegra.  

## 2017-01-06 NOTE — Assessment & Plan Note (Signed)
Last TSH 1.1 10/21/16,  continueLevothyroxine 150mcg daily 

## 2017-02-09 ENCOUNTER — Non-Acute Institutional Stay: Payer: 59 | Admitting: Nurse Practitioner

## 2017-02-09 ENCOUNTER — Encounter: Payer: Self-pay | Admitting: Nurse Practitioner

## 2017-02-09 DIAGNOSIS — G5793 Unspecified mononeuropathy of bilateral lower limbs: Secondary | ICD-10-CM

## 2017-02-09 DIAGNOSIS — G5792 Unspecified mononeuropathy of left lower limb: Secondary | ICD-10-CM | POA: Diagnosis not present

## 2017-02-09 DIAGNOSIS — G5791 Unspecified mononeuropathy of right lower limb: Secondary | ICD-10-CM

## 2017-02-09 DIAGNOSIS — I10 Essential (primary) hypertension: Secondary | ICD-10-CM | POA: Diagnosis not present

## 2017-02-09 DIAGNOSIS — M2550 Pain in unspecified joint: Secondary | ICD-10-CM

## 2017-02-09 DIAGNOSIS — R609 Edema, unspecified: Secondary | ICD-10-CM | POA: Diagnosis not present

## 2017-02-09 DIAGNOSIS — E039 Hypothyroidism, unspecified: Secondary | ICD-10-CM

## 2017-02-09 DIAGNOSIS — K5900 Constipation, unspecified: Secondary | ICD-10-CM | POA: Diagnosis not present

## 2017-02-09 DIAGNOSIS — J301 Allergic rhinitis due to pollen: Secondary | ICD-10-CM | POA: Diagnosis not present

## 2017-02-09 NOTE — Assessment & Plan Note (Signed)
Multiple joints: knees, shoulders, wrists, spine hips, Tylenol 650mg  qd and q4h prn  10/31/16 X-ray R shoulder and ankle: no acute fracture or dislocation Pain in the right sided body, observe.

## 2017-02-09 NOTE — Assessment & Plan Note (Signed)
Minimal edema in ankles/feet, continue Furosemide 20mg daily 

## 2017-02-09 NOTE — Assessment & Plan Note (Signed)
permissive blood pressure control. Continue Furosemide 20mg, 10/21/16 wbc 7.3, Hgb 14.7, plt 182, Na 138, K 4.5, Bun 29, creat 0.84, TSH 1.1   

## 2017-02-09 NOTE — Assessment & Plan Note (Signed)
Stable, continue Allegra.  

## 2017-02-09 NOTE — Assessment & Plan Note (Signed)
Stable, continue Colace 200mg  daily, MOM prn

## 2017-02-09 NOTE — Assessment & Plan Note (Signed)
Last TSH 1.1 10/21/16,  continueLevothyroxine 150mcg daily 

## 2017-02-09 NOTE — Progress Notes (Signed)
Location:  [place] Room Number: 726 Place of Service:  ALF [phone]) Provider:  Aryaan Persichetti, Manxie  NP  Jeanmarie Hubert, MD  Patient Care Team: [name], MD as PCP - General (Internal Medicine) [name], MD (Internal Medicine) [place] Tangie Stay Otho Darner, NP as Nurse Practitioner (Nurse Practitioner) [place] Oddis Westling Otho Darner, NP as Nurse Practitioner (Nurse Practitioner)  Extended Emergency Contact Information Primary Emergency Contact: [name] [place] of [place] Phone: [phone] Relation: Son  Code Status:  DNR Goals of care: Advanced Directive information Advanced Directives 02/09/2017  Does Patient Have a Medical Advance Directive? Yes  Type of Paramedic of [place];Living will;Out of facility DNR (pink MOST or yellow form)  Does patient want to make changes to medical advance directive? No - Patient declined  Copy of [place] in Chart? Yes  Would patient like information on creating a medical advance directive? -  Pre-existing out of facility DNR order (yellow form or pink MOST form) Yellow form placed in chart (order not valid for inpatient use)     Chief Complaint  Patient presents with  . Medical Management of Chronic Issues    HPI:  [name] is a 83 y.o. female seen today for medical management of chronic diseases.    Hx of Hypothyroidism, taking Levothyroxine 162mg, last TSH 1.1 10/21/16. Chronic pain back, legs, not well controlled, off Fentanyl 28m/hr, taking Tylenol 65058mam and q4h prn,  blood pressure is controlled with Furosemide 55m57mily, Kcl 10me86mx of burning sensation in feet, chronic edema BLE.               Past Medical History:  Diagnosis Date  . Abnormal liver function tests    11/11/15 AST 31, ALT 88, alk phos 96   . Acute kidney failure, unspecified 01/24/2012  . Arthralgia 09/26/2014   Multiple joints: knees, shoulders, wrists, spine  hips   . Arthritis   . Candidiasis of other urogenital sites 08/10/2012  . Cerebral embolism 05/23/2005  . CHF (congestive heart failure) (HCC) [place] Cholelithiasis 11/04/2015  . Closed fracture of sacrum and coccyx without mention of spinal cord injury 02/14/2012  . Constipation 05/03/2013  . Contusion of face, scalp, and neck except eye(s) 06/01/2012  . Contusion of wrist 06/01/2012  . COPD (chronic obstructive pulmonary disease) (HCC) [place] COPD, mild (HCC) [place]/2014  . Edema 04/20/2012  . H/O: CVA (cerebrovascular accident)   . Hearing loss 09/26/2014  . History of cancer of uterus   . HTN (hypertension), benign   . Hyperglycemia 11/14/2015  . Hypothyroidism   . Insomnia, unspecified 08/10/2012  . Open wound of knee, leg (except thigh), and ankle, without mention of complication 04/21/23/5/3646ther and unspecified hyperlipidemia 06/01/2012  . Other disorder of coccyx 01/27/2012  . Pain in joint, ankle and foot 06/14/2012  . Peripheral vascular disease, unspecified 01/27/2012  . Personal history of fall 01/27/2012  . Pneumonia, organism unspecified(486) 01/23/2005  . Rheumatic fever 09/26/1933   Age 50 10/01/14 ESR 13, RAF <10   . Seasonal allergies 05/03/2013  . Stasis dermatitis of both legs 09/26/2014  . Unspecified constipation 11/02/2012  . Unspecified hearing loss 02/08/2013  . Unspecified hereditary and idiopathic peripheral neuropathy 01/27/2012  . Urinary frequency 02/24/2014  . Ventricular fibrillation (HCC) [place]1/2013   Past Surgical History:  Procedure Laterality Date  . ABDOMINAL HYSTERECTOMY  1990   for endometrial cancer  .  CATARACT EXTRACTION W/ INTRAOCULAR LENS  IMPLANT, BILATERAL    . [place]  . GUM SURGERY  1932  . MASTOIDECTOMY  1920   bilateral  . THYROIDECTOMY  1960  . TONSILLECTOMY  1916    Allergies  Allergen Reactions  . Amoxicillin     Unknown, patient unable to answer questionnaire   . Avelox [Moxifloxacin]     Unknown: listed on MAR  .  Erythromycin     Unknown: listed on MAR  . Monistat [Miconazole]     Unknown: listed on MAR  . Morphine And Related     Unknown: listed on MAR  . Orange Juice [Orange Oil]     Unknown: listed on MAR    Allergies as of 02/09/2017      Reactions   Amoxicillin    Unknown, patient unable to answer questionnaire    Avelox [moxifloxacin]    Unknown: listed on MAR   Erythromycin    Unknown: listed on MAR   Monistat [miconazole]    Unknown: listed on MAR   Morphine And Related    Unknown: listed on MAR   Orange Juice [orange Oil]    Unknown: listed on MAR      Medication List       Accurate as of 02/09/17  4:06 PM. Always use your most recent med list.          acetaminophen 325 MG tablet Commonly known as:  TYLENOL Take 650 mg by mouth. Take 2 tablets once a day 10:00 pm. Take 2 tablets every 4 hours as needed for pain or headache   alum & mag hydroxide-simeth 200-200-20 MG/5ML suspension Commonly known as:  MAALOX/MYLANTA Take 30 mLs by mouth every 6 (six) hours as needed for indigestion or heartburn.   aspirin 81 MG chewable tablet Chew 81 mg by mouth daily.   B-complex with vitamin C tablet Take 1 tablet by mouth daily.   calcium citrate-vitamin D 315-200 MG-UNIT tablet Commonly known as:  CITRACAL+D Take 1 tablet by mouth daily.   clotrimazole-betamethasone cream Commonly known as:  LOTRISONE Apply 1 application topically 2 (two) times daily.   docusate sodium 100 MG capsule Commonly known as:  COLACE Take 200 mg by mouth daily.   EQL CENTURY MATURE Tabs Take 1 tablet by mouth daily.   fexofenadine 60 MG tablet Commonly known as:  ALLEGRA Take 60 mg by mouth daily.   furosemide 20 MG tablet Commonly known as:  LASIX Take 1 tablet by mouth daily.   levothyroxine 150 MCG tablet Commonly known as:  SYNTHROID, LEVOTHROID Take 1 tablet by mouth daily.   MILK OF MAGNESIA PO Take 30 mLs by mouth daily as needed.   potassium chloride 10 MEQ  tablet Commonly known as:  K-DUR,KLOR-CON Take 1 tablet by mouth daily.       Review of Systems  Constitutional: Negative for chills, diaphoresis and fever.  HENT: Positive for hearing loss (severe, f/u audiology, recently tube placed in the left ear drum,  hears better after impacted ceruen removed wtih ear lavage. ). Negative for congestion, ear discharge, ear pain and sore throat.   Eyes: Negative for photophobia, pain, discharge and redness.  Respiratory: Positive for cough. Negative for shortness of breath.   Cardiovascular: Positive for leg swelling (chronic. The patient stated its better than prior. ). Negative for chest pain and palpitations.       Chronic trace edema in ankles and stasis dermatitis, erythema, scaly, and painful ankles  and feet. R>L  Gastrointestinal: Negative for abdominal pain, blood in stool, constipation, diarrhea, nausea and vomiting.  Endocrine: Negative for polydipsia.  Genitourinary: Positive for frequency. Negative for dysuria, flank pain, hematuria and urgency.  Musculoskeletal: Positive for arthralgias and back pain. Negative for myalgias and neck pain.       Aches and pains in multiple sites. Hx fall 10/31/16.  Skin: Negative for rash.       Hx of Sacral coccygeal area excoriated and superficial stage II pressure ulcers.  Allergic/Immunologic: Negative for environmental allergies.  Neurological: Negative for dizziness, tremors, seizures and weakness.       Burning sensation in feet  Hematological: Does not bruise/bleed easily.  Psychiatric/Behavioral: Negative for hallucinations. The patient is not nervous/anxious.        Resolved visual hallucination after Cymbalta discontinued.    Immunization History  Administered Date(s) Administered  . Influenza Whole 10/19/2012  . Influenza-Unspecified 10/18/2013, 10/17/2014, 09/02/2015, 09/29/2016  . PPD Test 01/22/2013, 11/28/2015  . Pneumococcal Polysaccharide-23 01/22/2013  . Td 02/21/2009  . Tdap  04/07/2015   Pertinent  Health Maintenance Due  Topic Date Due  . DEXA SCAN  03/25/1978  . PNA vac Low Risk Adult (2 of 2 - PCV13) 01/22/2014  . INFLUENZA VACCINE  Completed   Fall Risk  11/14/2015 04/17/2015 09/26/2014 09/26/2014  Falls in the past year? No Yes No Yes  Number falls in past yr: - 1 - 2 or more  Injury with Fall? - Yes - -  Risk Factor Category  - High Fall Risk - -  Risk for fall due to : Impaired mobility;Impaired vision - - -   Functional Status Survey:    Vitals:   02/09/17 1050  BP: 140/68  Pulse: 90  Resp: 18  Temp: 97.9 F (36.6 C)  Weight: 149 lb (67.6 kg)  Height: 5' (1.524 m)   Body mass index is 29.1 kg/m. Physical Exam  Constitutional: She is oriented to person, place, and time. She appears well-developed and well-nourished. No distress.  HENT:  Head: Normocephalic and atraumatic.  Right Ear: External ear normal.  Left Ear: External ear normal.  Nose: Nose normal.  Mouth/Throat: Oropharynx is clear and moist. No oropharyngeal exudate.  Left TM tube seen.   Eyes: Conjunctivae and EOM are normal. Pupils are equal, round, and reactive to light. Right eye exhibits no discharge. Left eye exhibits no discharge.  Neck: Normal range of motion. Neck supple. No JVD present. No thyromegaly present.  Cardiovascular: Exam reveals no gallop and no friction rub.   No murmur heard. Pulmonary/Chest: Effort normal. No respiratory distress. She has rales in the right lower field and the left lower field. She exhibits no tenderness.  Abdominal: Soft. Bowel sounds are normal. She exhibits no distension and no mass. There is no tenderness. There is no guarding. No hernia.  Musculoskeletal: Normal range of motion. She exhibits edema (pedal and ankles. ) and tenderness (c/o lower back and knees pain. ).  Chronic trace edema in ankles/feet and stasis dermatitis, erythema, scaly, and painful ankles and feet. R>L  Lymphadenopathy:    She has no cervical adenopathy.   Neurological: She is alert and oriented to person, place, and time. She displays normal reflexes. No cranial nerve deficit. Coordination normal.  Skin: Skin is warm and dry. No rash noted. She is not diaphoretic. No erythema. No pallor.  chronic edema in ankles and stasis dermatitis, erythema, scaly, and painful ankles and feet. Excoriated R+L buttocks.   Psychiatric: She has  a normal mood and affect. Her behavior is normal. Judgment and thought content normal.  Some confusion    Labs reviewed:  Recent Labs  05/04/16 10/21/16  NA 141 138  K 4.2 4.5  BUN 26* 29*  CREATININE 0.8 0.8    Recent Labs  05/04/16 10/21/16  AST 16 24  ALT 9 11  ALKPHOS 54 53    Recent Labs  05/04/16 10/21/16  WBC 6.1 7.3  HGB 14.1 14.7  HCT 43 45  PLT 157 182   Lab Results  Component Value Date   TSH 1.10 10/21/2016   No results found for: HGBA1C No results found for: CHOL, HDL, LDLCALC, LDLDIRECT, TRIG, CHOLHDL  Significant Diagnostic Results in last 30 days:  No results found.  Assessment/Plan HTN (hypertension) permissive blood pressure control. Continue Furosemide 29m, 10/21/16 wbc 7.3, Hgb 14.7, plt 182, Na 138, K 4.5, Bun 29, creat 0.84, TSH 1.1   Seasonal allergies Stable, continue Allegra.   Constipation Stable, continue Colace 2075mdaily, MOM prn  Hypothyroidism Last TSH 1.1 10/21/16,  continueLevothyroxine 15019mdaily  Edema Minimal edema in ankles/feet, continue Furosemide 83m36mily  Arthralgia Multiple joints: knees, shoulders, wrists, spine hips, Tylenol 650mg54mand q4h prn  10/31/16 X-ray R shoulder and ankle: no acute fracture or dislocation Pain in the right sided body, observe.     Neuropathic pain of both legs Burning sensation in feet mainly.       Family/ staff Communication: AL  Labs/tests ordered:  none

## 2017-02-09 NOTE — Assessment & Plan Note (Signed)
Burning sensation in feet mainly.  

## 2017-03-15 ENCOUNTER — Telehealth: Payer: Self-pay

## 2017-03-15 NOTE — Telephone Encounter (Signed)
[facility] needs order for wheelchair for patient. Order for wheelchair written Dx: CHF I50.9, Edema R60.9, Gait instability R26.81, Arthralgia multiple joints M25.50. Rx was given to [place] Caster, [facility] to come to [facility] to pick it up.  This is the second Rx written, first one written 03/10/17 was lost.

## 2017-03-17 ENCOUNTER — Encounter: Payer: Self-pay | Admitting: Internal Medicine

## 2017-03-17 ENCOUNTER — Non-Acute Institutional Stay: Payer: Medicare Other | Admitting: Internal Medicine

## 2017-03-17 DIAGNOSIS — J449 Chronic obstructive pulmonary disease, unspecified: Secondary | ICD-10-CM

## 2017-03-17 DIAGNOSIS — R2681 Unsteadiness on feet: Secondary | ICD-10-CM

## 2017-03-17 DIAGNOSIS — I10 Essential (primary) hypertension: Secondary | ICD-10-CM | POA: Diagnosis not present

## 2017-03-17 DIAGNOSIS — R739 Hyperglycemia, unspecified: Secondary | ICD-10-CM | POA: Diagnosis not present

## 2017-03-17 DIAGNOSIS — E039 Hypothyroidism, unspecified: Secondary | ICD-10-CM

## 2017-03-17 NOTE — Progress Notes (Signed)
Progress Note    Location:  [facility] Room Number: [id number] Place of Service:  ALF [phone]) Provider:  Jeanmarie Hubert, MD  Patient Care Team: [name], MD as PCP - General (Internal Medicine) [name], MD (Internal Medicine) [place] Otho Darner, NP as Nurse Practitioner (Nurse Practitioner) [place] Otho Darner, NP as Nurse Practitioner (Nurse Practitioner)  Extended Emergency Contact Information Primary Emergency Contact: [name] States of [place] Phone: [phone] Relation: Son  Code Status:  DNR Goals of care: Advanced Directive information Advanced Directives 03/17/2017  Does Patient Have a Medical Advance Directive? Yes  Type of Paramedic of [place];Living will;Out of facility DNR (pink MOST or yellow form)  Does patient want to make changes to medical advance directive? -  Copy of [place] in Chart? -  Would patient like information on creating a medical advance directive? -  Pre-existing out of facility DNR order (yellow form or pink MOST form) Yellow form placed in chart (order not valid for inpatient use)     Chief Complaint  Patient presents with  . Medical Management of Chronic Issues    routine visit    HPI:  Pt is a 83 y.o. female seen today for medical management of chronic diseases.    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 Abnormal liver function tests    11/11/15 AST 31, ALT 88, alk phos 96   . Acute kidney failure, unspecified 01/24/2012  . Arthralgia 09/26/2014   Multiple joints: knees, shoulders, wrists, spine hips   . Arthritis   . Candidiasis of other urogenital sites 08/10/2012  . Cerebral embolism 05/23/2005  .  CHF (congestive heart failure) ([place])   . Cholelithiasis 11/04/2015  . Closed fracture of sacrum and coccyx without mention of spinal cord injury 02/14/2012  . Constipation 05/03/2013  . Contusion of face, scalp, and neck except eye(s) 06/01/2012  . Contusion of wrist 06/01/2012  . COPD (chronic obstructive pulmonary disease) ([place])   . COPD, mild ([place]) 01/20/2013  . Edema 04/20/2012  . H/O: CVA (cerebrovascular accident)   . Hearing loss 09/26/2014  . History of cancer of uterus   . HTN (hypertension), benign   . Hyperglycemia 11/14/2015  . Hypothyroidism   . Insomnia, unspecified 08/10/2012  . Open wound of knee, leg (except thigh), and ankle, without mention of complication 07/13/1030  . Other and unspecified hyperlipidemia 06/01/2012  . Other disorder of coccyx 01/27/2012  . Pain in joint, ankle and foot 06/14/2012  . Peripheral vascular disease, unspecified 01/27/2012  . Personal history of fall 01/27/2012  . Pneumonia, organism unspecified(486) 01/23/2005  . Rheumatic fever 09/26/1933   Age 16 10/01/14 ESR 13, RAF <10   . Seasonal allergies 05/03/2013  . Stasis dermatitis of both legs 09/26/2014  . Unspecified constipation 11/02/2012  . Unspecified hearing loss 02/08/2013  . Unspecified hereditary and idiopathic peripheral neuropathy 01/27/2012  . Urinary frequency 02/24/2014  . Ventricular fibrillation ([place]) 01/24/2012   Past Surgical History:  Procedure Laterality Date  . ABDOMINAL HYSTERECTOMY  1990   for endometrial cancer  . CATARACT EXTRACTION W/ INTRAOCULAR LENS  IMPLANT, BILATERAL    . [place]  . GUM SURGERY  1932  .  MASTOIDECTOMY  1920   bilateral  . THYROIDECTOMY  1960  . TONSILLECTOMY  1916    Allergies  Allergen Reactions  . Amoxicillin     Unknown, patient unable to answer questionnaire   . Avelox [Moxifloxacin]     Unknown: listed on MAR  . Erythromycin     Unknown: listed on MAR  . Monistat [Miconazole]     Unknown: listed on MAR  . Morphine  And Related     Unknown: listed on MAR  . Orange Juice [Orange Oil]     Unknown: listed on MAR    Allergies as of 03/17/2017      Reactions   Amoxicillin    Unknown, patient unable to answer questionnaire    Avelox [moxifloxacin]    Unknown: listed on MAR   Erythromycin    Unknown: listed on MAR   Monistat [miconazole]    Unknown: listed on MAR   Morphine And Related    Unknown: listed on MAR   Orange Juice [orange Oil]    Unknown: listed on [facility]      Medication List       Accurate as of 03/17/17  2:26 PM. Always use your most recent med list.          acetaminophen 325 MG tablet Commonly known as:  TYLENOL Take 650 mg by mouth. Take 2 tablets once a day 10:00 pm. Take 2 tablets every 4 hours as needed for pain or headache   alum & mag hydroxide-simeth 200-200-20 MG/5ML suspension Commonly known as:  MAALOX/MYLANTA Take 30 mLs by mouth every 6 (six) hours as needed for indigestion or heartburn.   aspirin 81 MG chewable tablet Chew 81 mg by mouth daily.   B-complex with vitamin C tablet Take 1 tablet by mouth daily.   calcium citrate-vitamin D 315-200 MG-UNIT tablet Commonly known as:  CITRACAL+D Take 1 tablet by mouth daily.   clotrimazole-betamethasone cream Commonly known as:  LOTRISONE Apply 1 application topically 2 (two) times daily.   docusate sodium 100 MG capsule Commonly known as:  COLACE Take 200 mg by mouth daily.   EQL CENTURY MATURE Tabs Take 1 tablet by mouth daily.   fexofenadine 60 MG tablet Commonly known as:  ALLEGRA Take 60 mg by mouth daily.   furosemide 20 MG tablet Commonly known as:  LASIX Take 1 tablet by mouth daily.   ipratropium-albuterol 0.5-2.5 (3) MG/3ML Soln Commonly known as:  DUONEB Take 3 mLs by nebulization. Duonebs twice a day   levothyroxine 150 MCG tablet Commonly known as:  SYNTHROID, LEVOTHROID Take 1 tablet by mouth daily.   MILK OF MAGNESIA PO Take 30 mLs by mouth daily as needed.   potassium chloride  10 MEQ tablet Commonly known as:  K-DUR,KLOR-CON Take 1 tablet by mouth daily.       Review of Systems  Constitutional: Negative for chills, diaphoresis and fever.  HENT: Positive for hearing loss (severe, f/u audiology, recently tube placed in the left ear drum,  hears better after impacted ceruen removed wtih ear lavage. ). Negative for congestion, ear discharge, ear pain and sore throat.   Eyes: Negative for photophobia, pain, discharge and redness.  Respiratory: Positive for cough. Negative for shortness of breath.   Cardiovascular: Positive for leg swelling (chronic. The patient stated its better than prior. ). Negative for chest pain and palpitations.       Chronic trace edema in ankles and stasis dermatitis, erythema, scaly, and painful ankles and feet. R>L  Gastrointestinal: Negative for abdominal pain, blood in stool, constipation, diarrhea, nausea and vomiting.  Endocrine: Negative for polydipsia.  Genitourinary: Positive for frequency. Negative for dysuria, flank pain, hematuria and urgency.  Musculoskeletal: Positive for arthralgias and back pain. Negative for myalgias and neck pain.       Aches and pains in multiple sites. Hx fall 10/31/16.  Skin: Negative for rash.       Hx of Sacral coccygeal area excoriated and superficial stage II pressure ulcers.  Allergic/Immunologic: Negative for environmental allergies.  Neurological: Negative for dizziness, tremors, seizures and weakness.       Burning sensation in feet  Hematological: Does not bruise/bleed easily.  Psychiatric/Behavioral: Negative for hallucinations. The patient is not nervous/anxious.        Resolved visual hallucination after Cymbalta discontinued.    Immunization History  Administered Date(s) Administered  . Influenza Whole 10/19/2012  . Influenza-Unspecified 10/18/2013, 10/17/2014, 09/02/2015, 09/29/2016  . PPD Test 01/22/2013, 11/28/2015  . Pneumococcal Polysaccharide-23 01/22/2013  . Td 02/21/2009  .  Tdap 04/07/2015   Pertinent  Health Maintenance Due  Topic Date Due  . DEXA SCAN  03/25/1978  . PNA vac Low Risk Adult (2 of 2 - PCV13) 01/22/2014  . INFLUENZA VACCINE  07/13/2017   Fall Risk  11/14/2015 04/17/2015 09/26/2014 09/26/2014  Falls in the past year? No Yes No Yes  Number falls in past yr: - 1 - 2 or more  Injury with Fall? - Yes - -  Risk Factor Category  - High Fall Risk - -  Risk for fall due to : Impaired mobility;Impaired vision - - -   Functional Status Survey:    Vitals:   03/17/17 1419  BP: 128/72  Pulse: 78  Resp: 18  Temp: 98.6 F (37 C)  SpO2: 93%  Weight: 145 lb 12.8 oz (66.1 kg)  Height: 5' (1.524 m)   Body mass index is 28.47 kg/m. Physical Exam  Constitutional: She is oriented to person, place, and time. She appears well-developed and well-nourished. No distress.  HENT:  Head: Normocephalic and atraumatic.  Right Ear: External ear normal.  Left Ear: External ear normal.  Nose: Nose normal.  Mouth/Throat: Oropharynx is clear and moist. No oropharyngeal exudate.  Left TM tube seen.   Eyes: Conjunctivae and EOM are normal. Pupils are equal, round, and reactive to light. Right eye exhibits no discharge. Left eye exhibits no discharge.  Neck: Normal range of motion. Neck supple. No JVD present. No thyromegaly present.  Cardiovascular: Exam reveals no gallop and no friction rub.   No murmur heard. Pulmonary/Chest: Effort normal. No respiratory distress. She has rales in the right lower field and the left lower field. She exhibits no tenderness.  Abdominal: Soft. Bowel sounds are normal. She exhibits no distension and no mass. There is no tenderness. There is no guarding. No hernia.  Musculoskeletal: Normal range of motion. She exhibits edema (pedal and ankles. ) and tenderness (c/o lower back and knees pain. ).  Chronic trace edema in ankles/feet and stasis dermatitis, erythema, scaly, and painful ankles and feet. R>L  Lymphadenopathy:    She has no  cervical adenopathy.  Neurological: She is alert and oriented to person, place, and time. She displays normal reflexes. No cranial nerve deficit. Coordination normal.  Skin: Skin is warm and dry. No rash noted. She is not diaphoretic. No erythema. No pallor.  chronic edema in ankles and stasis dermatitis, erythema, scaly, and painful ankles and feet. Excoriated R+L buttocks.   Psychiatric: She  has a normal mood and affect. Her behavior is normal. Judgment and thought content normal.  Some confusion    Labs reviewed:  Recent Labs  05/04/16 10/21/16  NA 141 138  K 4.2 4.5  BUN 26* 29*  CREATININE 0.8 0.8    Recent Labs  05/04/16 10/21/16  AST 16 24  ALT 9 11  ALKPHOS 54 53    Recent Labs  05/04/16 10/21/16  WBC 6.1 7.3  HGB 14.1 14.7  HCT 43 45  PLT 157 182   Lab Results  Component Value Date   TSH 1.10 10/21/2016    Assessment/Plan 1. Essential hypertension controlled  2. Gait instability Needs help with transfers  3. COPD, mild ([place]) Dyspneic with exertion  4. Hyperglycemia controlled  5. Hypothyroidism, unspecified type compensated

## 2017-04-06 NOTE — Addendum Note (Signed)
Addended by: Kimber Relic on: 04/06/2017 11:19 AM   Modules accepted: Level of Service

## 2017-04-14 ENCOUNTER — Non-Acute Institutional Stay: Payer: Medicare Other | Admitting: Nurse Practitioner

## 2017-04-14 ENCOUNTER — Encounter: Payer: Self-pay | Admitting: Nurse Practitioner

## 2017-04-14 DIAGNOSIS — I10 Essential (primary) hypertension: Secondary | ICD-10-CM | POA: Diagnosis not present

## 2017-04-14 DIAGNOSIS — K5900 Constipation, unspecified: Secondary | ICD-10-CM

## 2017-04-14 DIAGNOSIS — J449 Chronic obstructive pulmonary disease, unspecified: Secondary | ICD-10-CM | POA: Diagnosis not present

## 2017-04-14 DIAGNOSIS — R609 Edema, unspecified: Secondary | ICD-10-CM

## 2017-04-14 DIAGNOSIS — L89152 Pressure ulcer of sacral region, stage 2: Secondary | ICD-10-CM

## 2017-04-14 DIAGNOSIS — M2550 Pain in unspecified joint: Secondary | ICD-10-CM

## 2017-04-14 DIAGNOSIS — E039 Hypothyroidism, unspecified: Secondary | ICD-10-CM

## 2017-04-14 DIAGNOSIS — R7989 Other specified abnormal findings of blood chemistry: Secondary | ICD-10-CM

## 2017-04-14 DIAGNOSIS — J301 Allergic rhinitis due to pollen: Secondary | ICD-10-CM

## 2017-04-14 DIAGNOSIS — R945 Abnormal results of liver function studies: Secondary | ICD-10-CM

## 2017-04-14 DIAGNOSIS — R2681 Unsteadiness on feet: Secondary | ICD-10-CM

## 2017-04-14 NOTE — Assessment & Plan Note (Signed)
Continue to ambulates with walker, w/c to go further, risk falling.   

## 2017-04-14 NOTE — Assessment & Plan Note (Signed)
Update LFT.  

## 2017-04-14 NOTE — Assessment & Plan Note (Signed)
Last TSH 1.1 10/21/16,  continueLevothyroxine 150mcg daily, update TSH

## 2017-04-14 NOTE — Assessment & Plan Note (Signed)
Stable, continue Allegra.  

## 2017-04-14 NOTE — Assessment & Plan Note (Signed)
permissive blood pressure control. Continue Furosemide 20mg , update CBC and CMP

## 2017-04-14 NOTE — Assessment & Plan Note (Signed)
Stable

## 2017-04-14 NOTE — Progress Notes (Signed)
Location:  [facility] Room Number: 338 Place of Service:  ALF [phone]) Provider:  , xie  NP  Jeanmarie Hubert, MD  Patient Care Team: [name], MD as PCP - General (Internal Medicine) [name], MD (Internal Medicine) [place]  Otho Darner, NP as Nurse Practitioner (Nurse Practitioner) [place]  Otho Darner, NP as Nurse Practitioner (Nurse Practitioner)  Extended Emergency Contact Information Primary Emergency Contact: [name] States of [place] Phone: [phone] Relation: Son  Code Status:  DNR Goals of care: Advanced Directive information Advanced Directives 04/14/2017  Does Patient Have a Medical Advance Directive? Yes  Type of Paramedic of [place];Living will;Out of facility DNR (pink MOST or yellow form)  Does patient want to make changes to medical advance directive? No - Patient declined  Copy of [place] in Chart? Yes  Would patient like information on creating a medical advance directive? -  Pre-existing out of facility DNR order (yellow form or pink MOST form) Yellow form placed in chart (order not valid for inpatient use)     Chief Complaint  Patient presents with  . Acute Visit    sacral wound ---> Stage 3     HPI:  Pt is a 83 y.o. female seen today for an acute visit for sacral pressure ulcer, recurred, no s/s of infection, moist and pressure contributory.     Hx of Hypothyroidism, taking Levothyroxine 156mg, last TSH 1.1 10/21/16. Chronic pain back, legs, not well controlled, off Fentanyl 249m/hr, taking Tylenol 65051mam and q4h prn,  blood pressure is controlled with Furosemide 71m9mily, Kcl 10me38mx of burning sensation in feet, chronic edema BLE.  Past Medical History:  Diagnosis Date  . Abnormal liver function tests    11/11/15 AST 31, ALT 88, alk phos 96   . Acute kidney failure, unspecified (HCC) [place]1/2013  .  Arthralgia 09/26/2014   Multiple joints: knees, shoulders, wrists, spine hips   . Arthritis   . Candidiasis of other urogenital sites 08/10/2012  . Cerebral embolism 05/23/2005  . CHF (congestive heart failure) (HCC) [place] Cholelithiasis 11/04/2015  . Closed fracture of sacrum and coccyx without mention of spinal cord injury 02/14/2012  . Constipation 05/03/2013  . Contusion of face, scalp, and neck except eye(s) 06/01/2012  . Contusion of wrist 06/01/2012  . COPD (chronic obstructive pulmonary disease) (HCC) [place] COPD, mild (HCC) [place]/2014  . Edema 04/20/2012  . H/O: CVA (cerebrovascular accident)   . Hearing loss 09/26/2014  . History of cancer of uterus   . HTN (hypertension), benign   . Hyperglycemia 11/14/2015  . Hypothyroidism   . Insomnia, unspecified 08/10/2012  . Open wound of knee, leg (except thigh), and ankle, without mention of complication 04/20/28/3/7902ther and unspecified hyperlipidemia 06/01/2012  . Other disorder of coccyx 01/27/2012  . Pain in joint, ankle and foot 06/14/2012  . Peripheral vascular disease, unspecified (HCC) [place]4/2013  . Personal history of fall 01/27/2012  . Pneumonia, organism unspecified(486) 01/23/2005  . Rheumatic fever 09/26/1933   Age 64 10/01/14 ESR 13, RAF <10   . Seasonal allergies 05/03/2013  . Stasis dermatitis of both legs 09/26/2014  . Unspecified constipation 11/02/2012  . Unspecified hearing loss 02/08/2013  . Unspecified hereditary and idiopathic peripheral neuropathy 01/27/2012  . Urinary frequency 02/24/2014  . Ventricular fibrillation (HCC) [place]1/2013   Past Surgical History:  Procedure Laterality Date  . ABDOMINAL HYSTERECTOMY  1990  for endometrial cancer  . CATARACT EXTRACTION W/ INTRAOCULAR LENS  IMPLANT, BILATERAL    . [place]  . GUM SURGERY  1932  . MASTOIDECTOMY  1920   bilateral  . THYROIDECTOMY  1960  . TONSILLECTOMY  1916    Allergies  Allergen Reactions  . Amoxicillin     Unknown, patient unable to  answer questionnaire   . Avelox [Moxifloxacin]     Unknown: listed on MAR  . Erythromycin     Unknown: listed on MAR  . Monistat [Miconazole]     Unknown: listed on MAR  . Morphine And Related     Unknown: listed on MAR  . Orange Juice [Orange Oil]     Unknown: listed on [facility]    Outpatient Encounter Prescriptions as of 04/14/2017  Medication Sig  . acetaminophen (TYLENOL) 325 MG tablet Take 650 mg by mouth. Take 2 tablets once a day 10:00 pm. Take 2 tablets every 4 hours as needed for pain or headache  . alum & mag hydroxide-simeth (MAALOX/MYLANTA) 200-200-20 MG/5ML suspension Take 30 mLs by mouth every 6 (six) hours as needed for indigestion or heartburn.  Marland Kitchen aspirin 81 MG chewable tablet Chew 81 mg by mouth daily.  . B Complex-C (B-COMPLEX WITH VITAMIN C) tablet Take 1 tablet by mouth daily.  . calcium citrate-vitamin D (CITRACAL+D) 315-200 MG-UNIT per tablet Take 1 tablet by mouth daily.  . clotrimazole-betamethasone (LOTRISONE) cream Apply 1 application topically 2 (two) times daily.  Marland Kitchen docusate sodium (COLACE) 100 MG capsule Take 200 mg by mouth daily.   . fexofenadine (ALLEGRA) 60 MG tablet Take 60 mg by mouth daily.  . furosemide (LASIX) 20 MG tablet Take 1 tablet by mouth daily.  Marland Kitchen ipratropium-albuterol (DUONEB) 0.5-2.5 (3) MG/3ML SOLN Take 3 mLs by nebulization. Duonebs twice a day  . levothyroxine (SYNTHROID, LEVOTHROID) 150 MCG tablet Take 1 tablet by mouth daily.  . Magnesium Hydroxide (MILK OF MAGNESIA PO) Take 30 mLs by mouth daily as needed.  . Multiple Vitamins-Minerals (EQL CENTURY MATURE) TABS Take 1 tablet by mouth daily.   Marland Kitchen nystatin (NYSTATIN) powder Apply topically 4 (four) times daily. Apply to buttock as needed.  . potassium chloride (K-DUR,KLOR-CON) 10 MEQ tablet Take 1 tablet by mouth daily.   No facility-administered encounter medications on file as of 04/14/2017.     Review of Systems  Constitutional: Negative for chills, diaphoresis and fever.  HENT:  Positive for hearing loss (severe, f/u audiology, recently tube placed in the left ear drum,  hears better after impacted ceruen removed wtih ear lavage. ). Negative for congestion, ear discharge, ear pain and sore throat.   Eyes: Negative for photophobia, pain, discharge and redness.  Respiratory: Positive for cough. Negative for shortness of breath.   Cardiovascular: Positive for leg swelling (chronic. The patient stated its better than prior. ). Negative for chest pain and palpitations.       Chronic trace edema in ankles and stasis dermatitis, erythema, scaly, and painful ankles and feet. R>L  Gastrointestinal: Negative for abdominal pain, blood in stool, constipation, diarrhea, nausea and vomiting.  Endocrine: Negative for polydipsia.  Genitourinary: Positive for frequency. Negative for dysuria, flank pain, hematuria and urgency.  Musculoskeletal: Positive for arthralgias and back pain. Negative for myalgias and neck pain.       Aches and pains in multiple sites. Hx fall 10/31/16.  Skin: Negative for rash.       Hx of Sacral coccygeal area excoriated and superficial stage II  pressure ulcers.  Allergic/Immunologic: Negative for environmental allergies.  Neurological: Negative for dizziness, tremors, seizures and weakness.       Burning sensation in feet  Hematological: Does not bruise/bleed easily.  Psychiatric/Behavioral: Negative for hallucinations. The patient is not nervous/anxious.        Resolved visual hallucination after Cymbalta discontinued.    Immunization History  Administered Date(s) Administered  . Influenza Whole 10/19/2012  . Influenza-Unspecified 10/18/2013, 10/17/2014, 09/02/2015, 09/29/2016  . PPD Test 01/22/2013, 11/28/2015  . Pneumococcal Polysaccharide-23 01/22/2013  . Td 02/21/2009  . Tdap 04/07/2015   Pertinent  Health Maintenance Due  Topic Date Due  . DEXA SCAN  03/25/1978  . PNA vac Low Risk Adult (2 of 2 - PCV13) 01/22/2014  . INFLUENZA VACCINE   07/13/2017   Fall Risk  11/14/2015 04/17/2015 09/26/2014 09/26/2014  Falls in the past year? No Yes No Yes  Number falls in past yr: - 1 - 2 or more  Injury with Fall? - Yes - -  Risk Factor Category  - High Fall Risk - -  Risk for fall due to : Impaired mobility;Impaired vision - - -   Functional Status Survey:    Vitals:   04/14/17 1104  BP: 128/70  Pulse: 77  Resp: 18  Temp: 98.3 F (36.8 C)  Weight: 151 lb 3.2 oz (68.6 kg)  Height: 5' (1.524 m)   Body mass index is 29.53 kg/m. Physical Exam  Constitutional: She is oriented to person, place, and time. She appears well-developed and well-nourished. No distress.  HENT:  Head: Normocephalic and atraumatic.  Right Ear: External ear normal.  Left Ear: External ear normal.  Nose: Nose normal.  Mouth/Throat: Oropharynx is clear and moist. No oropharyngeal exudate.  Left TM tube seen.   Eyes: Conjunctivae and EOM are normal. Pupils are equal, round, and reactive to light. Right eye exhibits no discharge. Left eye exhibits no discharge.  Neck: Normal range of motion. Neck supple. No JVD present. No thyromegaly present.  Cardiovascular: Exam reveals no gallop and no friction rub.   No murmur heard. Pulmonary/Chest: Effort normal. No respiratory distress. She has rales in the right lower field and the left lower field. She exhibits no tenderness.  Abdominal: Soft. Bowel sounds are normal. She exhibits no distension and no mass. There is no tenderness. There is no guarding. No hernia.  Musculoskeletal: Normal range of motion. She exhibits edema (pedal and ankles. ) and tenderness (c/o lower back and knees pain. ).  Chronic trace edema in ankles/feet and stasis dermatitis, erythema, scaly, and painful ankles and feet. R>L  Lymphadenopathy:    She has no cervical adenopathy.  Neurological: She is alert and oriented to person, place, and time. She displays normal reflexes. No cranial nerve deficit. Coordination normal.  Skin: Skin is  warm and dry. No rash noted. She is not diaphoretic. No erythema. No pallor.  chronic edema in ankles and stasis dermatitis, erythema, scaly, and painful ankles and feet. Excoriated R+L buttocks.   Psychiatric: She has a normal mood and affect. Her behavior is normal. Judgment and thought content normal.  Some confusion    Labs reviewed:  Recent Labs  05/04/16 10/21/16  NA 141 138  K 4.2 4.5  BUN 26* 29*  CREATININE 0.8 0.8    Recent Labs  05/04/16 10/21/16  AST 16 24  ALT 9 11  ALKPHOS 54 53    Recent Labs  05/04/16 10/21/16  WBC 6.1 7.3  HGB 14.1 14.7  HCT 43  45  PLT 157 182   Lab Results  Component Value Date   TSH 1.10 10/21/2016   No results found for: HGBA1C No results found for: CHOL, HDL, LDLCALC, LDLDIRECT, TRIG, CHOLHDL  Significant Diagnostic Results in last 30 days:  No results found.  Assessment/Plan Pressure ulcer, stage 2 Sacrum, pressure and moist reduction, observe.   HTN (hypertension) permissive blood pressure control. Continue Furosemide 43m, update CBC and CMP  COPD, mild (HCC) Stable.   Seasonal allergies Stable, continue [place]ra.   Constipation Stable, continue Colace 2057mdaily, MOM prn  Hypothyroidism Last TSH 1.1 10/21/16,  continueLevothyroxine 1503mdaily, update TSH  Edema Minimal edema in ankles/feet, continue Furosemide 43m72mily  Gait instability Continue to ambulates with walker, w/c to go further, risk falling.   Arthralgia Multiple joints: knees, shoulders, wrists, spine hips, continue Tylenol   10/31/16 X-ray R shoulder and ankle: no acute fracture or dislocation Pain in the right sided body, observe.   Abnormal liver function tests Update LFT     Family/ staff Communication: AL  Labs/tests ordered: CBC CMP TSH

## 2017-04-14 NOTE — Assessment & Plan Note (Signed)
Minimal edema in ankles/feet, continue Furosemide 20mg daily 

## 2017-04-14 NOTE — Assessment & Plan Note (Signed)
Stable, continue Colace 200mg  daily, MOM prn

## 2017-04-14 NOTE — Assessment & Plan Note (Signed)
Sacrum, pressure and moist reduction, observe.

## 2017-04-14 NOTE — Assessment & Plan Note (Signed)
Multiple joints: knees, shoulders, wrists, spine hips, continue Tylenol   10/31/16 X-ray R shoulder and ankle: no acute fracture or dislocation Pain in the right sided body, observe.

## 2017-04-19 LAB — HEPATIC FUNCTION PANEL
ALK PHOS: 55 U/L (ref 25–125)
ALT: 14 U/L (ref 7–35)
AST: 16 U/L (ref 13–35)
Bilirubin, Total: 0.4 mg/dL

## 2017-04-19 LAB — CBC AND DIFFERENTIAL
HCT: 39 % (ref 36–46)
Hemoglobin: 13.4 g/dL (ref 12.0–16.0)
Platelets: 170 10*3/uL (ref 150–399)
WBC: 6.4 10^3/mL

## 2017-04-19 LAB — BASIC METABOLIC PANEL
BUN: 30 mg/dL — AB (ref 4–21)
CREATININE: 0.9 mg/dL (ref <=1.1)
Glucose: 92 mg/dL
POTASSIUM: 4.1 mmol/L (ref 3.4–5.3)
SODIUM: 139 mmol/L (ref 137–147)

## 2017-04-19 LAB — TSH: TSH: 0.52 u[IU]/mL (ref <=5.90)

## 2017-04-20 ENCOUNTER — Other Ambulatory Visit: Payer: Self-pay | Admitting: *Deleted

## 2017-05-18 ENCOUNTER — Encounter: Payer: Self-pay | Admitting: Nurse Practitioner

## 2017-05-18 ENCOUNTER — Non-Acute Institutional Stay: Payer: Medicare Other | Admitting: Nurse Practitioner

## 2017-05-18 DIAGNOSIS — K5900 Constipation, unspecified: Secondary | ICD-10-CM | POA: Diagnosis not present

## 2017-05-18 DIAGNOSIS — J449 Chronic obstructive pulmonary disease, unspecified: Secondary | ICD-10-CM

## 2017-05-18 DIAGNOSIS — I10 Essential (primary) hypertension: Secondary | ICD-10-CM | POA: Diagnosis not present

## 2017-05-18 DIAGNOSIS — M2550 Pain in unspecified joint: Secondary | ICD-10-CM

## 2017-05-18 DIAGNOSIS — L89152 Pressure ulcer of sacral region, stage 2: Secondary | ICD-10-CM

## 2017-05-18 DIAGNOSIS — R609 Edema, unspecified: Secondary | ICD-10-CM

## 2017-05-18 DIAGNOSIS — E039 Hypothyroidism, unspecified: Secondary | ICD-10-CM

## 2017-05-18 NOTE — Assessment & Plan Note (Addendum)
permissive blood pressure control. Continue Furosemide 20mg , 04/19/17 Na 139, K 4.1, Bun 30, creat 0.88, TP 5.9, albumin 3.4, wbc 6.4, Hgb 13.4, plt 170

## 2017-05-18 NOTE — Assessment & Plan Note (Signed)
Minimal edema in ankles/feet, continue Furosemide 20mg daily 

## 2017-05-18 NOTE — Assessment & Plan Note (Signed)
Stable

## 2017-05-18 NOTE — Progress Notes (Signed)
Location:  [facility] Room Number: 161 Place of Service:  ALF [phone]) Provider:  Len Azeez, Manxie  NP  [name], MD  Patient Care Team: [name], MD as PCP - General (Internal Medicine) [name], MD (Internal Medicine) [place] Louie Flenner X, NP as Nurse Practitioner (Nurse Practitioner) Kathleen Arg[facility], [facility] Ranata Laughery X, NP as Nurse Practitioner (Nurse Practitioner)  Extended Emergency Contact Information Primary Emergency Contact: [name] [place] of [place] Phone: [phone] Relation: Son  Code Status:  DNR Goals of care: Advanced Directive information Advanced Directives 05/18/2017  Does Patient Have a Medical Advance Directive? Yes  Type of Paramedic of [place];Living will;Out of facility DNR (pink MOST or yellow form)  Does patient want to make changes to medical advance directive? No - Patient declined  Copy of [place] in Chart? Yes  Would patient like information on creating a medical advance directive? -  Pre-existing out of facility DNR order (yellow form or pink MOST form) Yellow form placed in chart (order not valid for inpatient use)     Chief Complaint  Patient presents with  . Medical Management of Chronic Issues    HPI:  Pt is a 83 y.o. female seen today for medical management of chronic diseases.       Hx of Hypothyroidism, taking Levothyroxine 170mg, last TSH 0.52 04/19/17. Chronic pain back, legs, not well controlled, taking Tylenol 653mqam and q4h prn, blood pressure is controlled with Furosemide 2049maily, Kcl 10m30mHx of burning sensation in feet, chronic edema BLE.  Past Medical History:  Diagnosis Date  . Abnormal liver function tests    11/11/15 AST 31, ALT 88, alk phos 96   . Acute kidney failure, unspecified (HCC)[place]11/2013  . Arthralgia 09/26/2014   Multiple joints: knees, shoulders, wrists, spine hips   .  Arthritis   . Candidiasis of other urogenital sites 08/10/2012  . Cerebral embolism 05/23/2005  . CHF (congestive heart failure) (HCC)[place]. Cholelithiasis 11/04/2015  . Closed fracture of sacrum and coccyx without mention of spinal cord injury 02/14/2012  . Constipation 05/03/2013  . Contusion of face, scalp, and neck except eye(s) 06/01/2012  . Contusion of wrist 06/01/2012  . COPD (chronic obstructive pulmonary disease) (HCC)[place]. COPD, mild (HCC)[place]8/2014  . Edema 04/20/2012  . H/O: CVA (cerebrovascular accident)   . Hearing loss 09/26/2014  . History of cancer of uterus   . HTN (hypertension), benign   . Hyperglycemia 11/14/2015  . Hypothyroidism   . Insomnia, unspecified 08/10/2012  . Open wound of knee, leg (except thigh), and ankle, without mention of complication 5/9/9/1/4782Other and unspecified hyperlipidemia 06/01/2012  . Other disorder of coccyx 01/27/2012  . Pain in joint, ankle and foot 06/14/2012  . Peripheral vascular disease, unspecified (HCC)[place]14/2013  . Personal history of fall 01/27/2012  . Pneumonia, organism unspecified(486) 01/23/2005  . Rheumatic fever 09/26/1933   Age 30 10/01/14 ESR 13, RAF <10   . Seasonal allergies 05/03/2013  . Stasis dermatitis of both legs 09/26/2014  . Unspecified constipation 11/02/2012  . Unspecified hearing loss 02/08/2013  . Unspecified hereditary and idiopathic peripheral neuropathy 01/27/2012  . Urinary frequency 02/24/2014  . Ventricular fibrillation (HCC)[place]11/2013   Past Surgical History:  Procedure Laterality Date  . ABDOMINAL HYSTERECTOMY  1990   for endometrial cancer  . CATARACT EXTRACTION W/ INTRAOCULAR LENS  IMPLANT, BILATERAL    .  [place]  . GUM SURGERY  1932  . MASTOIDECTOMY  1920   bilateral  . THYROIDECTOMY  1960  . TONSILLECTOMY  1916    Allergies  Allergen Reactions  . Amoxicillin     Unknown, patient unable to answer questionnaire   . Avelox [Moxifloxacin]     Unknown: listed on MAR  .  Erythromycin     Unknown: listed on MAR  . Monistat [Miconazole]     Unknown: listed on MAR  . Morphine And Related     Unknown: listed on MAR  . Orange Juice [Orange Oil]     Unknown: listed on [facility]    Outpatient Encounter Prescriptions as of 05/18/2017  Medication Sig  . acetaminophen (TYLENOL) 325 MG tablet Take 650 mg by mouth. Take 2 tablets once a day 10:00 pm. Take 2 tablets every 4 hours as needed for pain or headache  . alum & mag hydroxide-simeth (MAALOX/MYLANTA) 200-200-20 MG/5ML suspension Take 30 mLs by mouth every 6 (six) hours as needed for indigestion or heartburn.  Marland Kitchen aspirin 81 MG chewable tablet Chew 81 mg by mouth daily.  . B Complex-C (B-COMPLEX WITH VITAMIN C) tablet Take 1 tablet by mouth daily.  . calcium citrate-vitamin D (CITRACAL+D) 315-200 MG-UNIT per tablet Take 1 tablet by mouth daily.  . clotrimazole-betamethasone (LOTRISONE) cream Apply 1 application topically 2 (two) times daily.  Marland Kitchen docusate sodium (COLACE) 100 MG capsule Take 200 mg by mouth daily.   . fexofenadine (ALLEGRA) 60 MG tablet Take 60 mg by mouth daily.  . furosemide (LASIX) 20 MG tablet Take 1 tablet by mouth daily.  Marland Kitchen ipratropium-albuterol (DUONEB) 0.5-2.5 (3) MG/3ML SOLN Take 3 mLs by nebulization. Duonebs twice a day  . levothyroxine (SYNTHROID, LEVOTHROID) 150 MCG tablet Take 1 tablet by mouth daily.  . Magnesium Hydroxide (MILK OF MAGNESIA PO) Take 30 mLs by mouth daily as needed.  . Melatonin 3 MG TABS Take 1 tablet by mouth at bedtime.  . Multiple Vitamins-Minerals (EQL CENTURY MATURE) TABS Take 1 tablet by mouth daily.   Marland Kitchen nystatin (NYSTATIN) powder Apply topically 4 (four) times daily. Apply to buttock as needed.  . potassium chloride (K-DUR,KLOR-CON) 10 MEQ tablet Take 1 tablet by mouth daily.   No facility-administered encounter medications on file as of 05/18/2017.     Review of Systems  Constitutional: Negative for chills, diaphoresis and fever.  HENT: Positive for hearing loss  (severe, f/u audiology, recently tube placed in the left ear drum,  hears better after impacted ceruen removed wtih ear lavage. ). Negative for congestion, ear discharge, ear pain and sore throat.   Eyes: Negative for photophobia, pain, discharge and redness.  Respiratory: Positive for cough. Negative for shortness of breath.   Cardiovascular: Positive for leg swelling (chronic. The patient stated its better than prior. ). Negative for chest pain and palpitations.       Chronic trace edema in ankles and stasis dermatitis, erythema, scaly, and painful ankles and feet. R>L  Gastrointestinal: Negative for abdominal pain, blood in stool, constipation, diarrhea, nausea and vomiting.  Endocrine: Negative for polydipsia.  Genitourinary: Positive for frequency. Negative for dysuria, flank pain, hematuria and urgency.  Musculoskeletal: Positive for arthralgias and back pain. Negative for myalgias and neck pain.       Aches and pains in multiple sites. Hx fall 10/31/16.  Skin: Negative for rash.       Hx of Sacral coccygeal area excoriated changes  Allergic/Immunologic: Negative for environmental allergies.  Neurological: Negative for dizziness, tremors, seizures and weakness.       Burning sensation in feet  Hematological: Does not bruise/bleed easily.  Psychiatric/Behavioral: Negative for hallucinations. The patient is not nervous/anxious.        Resolved visual hallucination after Cymbalta discontinued.    Immunization History  Administered Date(s) Administered  . Influenza Whole 10/19/2012  . Influenza-Unspecified 10/18/2013, 10/17/2014, 09/02/2015, 09/29/2016  . PPD Test 01/22/2013, 11/28/2015  . Pneumococcal Polysaccharide-23 01/22/2013  . Td 02/21/2009  . Tdap 04/07/2015   Pertinent  Health Maintenance Due  Topic Date Due  . DEXA SCAN  03/25/1978  . PNA vac Low Risk Adult (2 of 2 - PCV13) 01/22/2014  . INFLUENZA VACCINE  07/13/2017   Fall Risk  11/14/2015 04/17/2015 09/26/2014 09/26/2014   Falls in the past year? No Yes No Yes  Number falls in past yr: - 1 - 2 or more  Injury with Fall? - Yes - -  Risk Factor Category  - High Fall Risk - -  Risk for fall due to : Impaired mobility;Impaired vision - - -   Functional Status Survey:    Vitals:   05/18/17 1330  BP: 130/68  Pulse: 78  Resp: 18  Temp: 97.8 F (36.6 C)  Weight: 148 lb 8 oz (67.4 kg)  Height: 5' (1.524 m)   Body mass index is 29 kg/m. Physical Exam  Constitutional: She is oriented to person, place, and time. She appears well-developed and well-nourished. No distress.  HENT:  Head: Normocephalic and atraumatic.  Right Ear: External ear normal.  Left Ear: External ear normal.  Nose: Nose normal.  Mouth/Throat: Oropharynx is clear and moist. No oropharyngeal exudate.  Left TM tube seen.   Eyes: Conjunctivae and EOM are normal. Pupils are equal, round, and reactive to light. Right eye exhibits no discharge. Left eye exhibits no discharge.  Neck: Normal range of motion. Neck supple. No JVD present. No thyromegaly present.  Cardiovascular: Exam reveals no gallop and no friction rub.   No murmur heard. Pulmonary/Chest: Effort normal. No respiratory distress. She has rales in the right lower field and the left lower field. She exhibits no tenderness.  Abdominal: Soft. Bowel sounds are normal. She exhibits no distension and no mass. There is no tenderness. There is no guarding. No hernia.  Musculoskeletal: Normal range of motion. She exhibits edema (pedal and ankles. ) and tenderness (c/o lower back and knees pain. ).  Chronic trace edema in ankles/feet and stasis dermatitis, erythema, scaly, and painful ankles and feet. R>L  Lymphadenopathy:    She has no cervical adenopathy.  Neurological: She is alert and oriented to person, place, and time. She displays normal reflexes. No cranial nerve deficit. Coordination normal.  Skin: Skin is warm and dry. No rash noted. She is not diaphoretic. No erythema. No  pallor.  chronic edema in ankles. Excoriated R+L buttocks.   Psychiatric: She has a normal mood and affect. Her behavior is normal. Judgment and thought content normal.  Some confusion    Labs reviewed:  Recent Labs  10/21/16 04/19/17  NA 138 139  K 4.5 4.1  BUN 29* 30*  CREATININE 0.8 0.9    Recent Labs  10/21/16 04/19/17  AST 24 16  ALT 11 14  ALKPHOS 53 55    Recent Labs  10/21/16 04/19/17  WBC 7.3 6.4  HGB 14.7 13.4  HCT 45 39  PLT 182 170   Lab Results  Component Value Date   TSH 0.52 04/19/2017  No results found for: HGBA1C No results found for: CHOL, HDL, LDLCALC, LDLDIRECT, TRIG, CHOLHDL  Significant Diagnostic Results in last 30 days:  No results found.  Assessment/Plan HTN (hypertension) permissive blood pressure control. Continue Furosemide 67m, 04/19/17 Na 139, K 4.1, Bun 30, creat 0.88, TP 5.9, albumin 3.4, wbc 6.4, Hgb 13.4, plt 170  COPD, mild (HCC) Stable.   Constipation Stable, continue Colace 2035mdaily, MOM prn  Hypothyroidism Last TSH 0.52 04/19/17,  continueLevothyroxine 15023mdaily  Edema Minimal edema in ankles/feet, continue Furosemide 80m44mily  Arthralgia Multiple joints: knees, shoulders, wrists, spine hips, continue Tylenol 1000mg52m, q4h prn  10/31/16 X-ray R shoulder and ankle: no acute fracture or dislocation Pain in the right sided body, observe.   Pressure ulcer, stage 2 Healed, the R+L buttocks remained discolored in the dark red color.      Family/ staff Communication: AL  Labs/tests ordered:  none

## 2017-05-18 NOTE — Assessment & Plan Note (Signed)
Healed, the R+L buttocks remained discolored in the dark red color.

## 2017-05-18 NOTE — Assessment & Plan Note (Signed)
Multiple joints: knees, shoulders, wrists, spine hips, continue Tylenol 1000mg  qam, q4h prn  10/31/16 X-ray R shoulder and ankle: no acute fracture or dislocation Pain in the right sided body, observe.

## 2017-05-18 NOTE — Assessment & Plan Note (Signed)
Last TSH 0.52 04/19/17,  continueLevothyroxine 150mcg daily

## 2017-05-18 NOTE — Assessment & Plan Note (Signed)
Stable, continue Colace 200mg  daily, MOM prn

## 2017-08-12 ENCOUNTER — Non-Acute Institutional Stay: Payer: Medicare Other | Admitting: Nurse Practitioner

## 2017-08-12 ENCOUNTER — Encounter: Payer: Self-pay | Admitting: Nurse Practitioner

## 2017-08-12 DIAGNOSIS — B372 Candidiasis of skin and nail: Secondary | ICD-10-CM

## 2017-08-12 DIAGNOSIS — L89302 Pressure ulcer of unspecified buttock, stage 2: Secondary | ICD-10-CM | POA: Diagnosis not present

## 2017-08-12 NOTE — Assessment & Plan Note (Signed)
Stage II pressure ulcer R+L buttock presented as small openings in the reddened excoriated areas, no s/s of infection or bleeding. The pressure in setting of heat/moist is etiology, will encourage the patient change her position while in her recliner frequently for pressure reduction to the areas. Will assist the patient with better personal care. Will treat heat/moist related skin yeast infection. observe

## 2017-08-12 NOTE — Progress Notes (Signed)
Location:   [facility] Room Number: 829 Place of Service:  ALF [phone]) Provider: Lennie Odor Mast NP  Blanchie Serve, MD  Patient Care Team: Blanchie Serve, MD as PCP - General (Internal Medicine) [name], MD (Internal Medicine) [place] Mast, Man X, NP as Nurse Practitioner (Nurse Practitioner) [place] Mast, Man X, NP as Nurse Practitioner (Nurse Practitioner)  Extended Emergency Contact Information Primary Emergency Contact: [name] States of [place] Phone: [phone] Relation: Son  Code Status: DNR Goals of care: Advanced Directive information Advanced Directives 08/12/2017  Does Patient Have a Medical Advance Directive? Yes  Type of Paramedic of [place];Living will;Out of facility DNR (pink MOST or yellow form)  Does patient want to make changes to medical advance directive? No - Patient declined  Copy of [place] in Chart? Yes  Would patient like information on creating a medical advance directive? -  Pre-existing out of facility DNR order (yellow form or pink MOST form) Yellow form placed in chart (order not valid for inpatient use)     Chief Complaint  Patient presents with  . Acute Visit    skin breakdown,     HPI:  Pt is a 83 y.o. female seen today for an acute visit for buttocks pressure ulcer, c/o pain at times, no active bleeding reported. She has chronic urinary leakage, using pads for protection, she stays in her recliner most of time during day and sleeps in it at night.    Past Medical History:  Diagnosis Date  . Abnormal liver function tests    11/11/15 AST 31, ALT 88, alk phos 96   . Acute kidney failure, unspecified ([place]) 01/24/2012  . Arthralgia 09/26/2014   Multiple joints: knees, shoulders, wrists, spine hips   . Arthritis   . Candidiasis of other urogenital sites 08/10/2012  . Cerebral embolism 05/23/2005  . CHF (congestive  heart failure) ([place])   . Cholelithiasis 11/04/2015  . Closed fracture of sacrum and coccyx without mention of spinal cord injury 02/14/2012  . Constipation 05/03/2013  . Contusion of face, scalp, and neck except eye(s) 06/01/2012  . Contusion of wrist 06/01/2012  . COPD (chronic obstructive pulmonary disease) ([place])   . COPD, mild ([place]) 01/20/2013  . Edema 04/20/2012  . H/O: CVA (cerebrovascular accident)   . Hearing loss 09/26/2014  . History of cancer of uterus   . HTN (hypertension), benign   . Hyperglycemia 11/14/2015  . Hypothyroidism   . Insomnia, unspecified 08/10/2012  . Open wound of knee, leg (except thigh), and ankle, without mention of complication 1/0/1751  . Other and unspecified hyperlipidemia 06/01/2012  . Other disorder of coccyx 01/27/2012  . Pain in joint, ankle and foot 06/14/2012  . Peripheral vascular disease, unspecified ([place]) 01/27/2012  . Personal history of fall 01/27/2012  . Pneumonia, organism unspecified(486) 01/23/2005  . Rheumatic fever 09/26/1933   Age 85 10/01/14 ESR 13, RAF <10   . Seasonal allergies 05/03/2013  . Stasis dermatitis of both legs 09/26/2014  . Unspecified constipation 11/02/2012  . Unspecified hearing loss 02/08/2013  . Unspecified hereditary and idiopathic peripheral neuropathy 01/27/2012  . Urinary frequency 02/24/2014  . Ventricular fibrillation ([place]) 01/24/2012   Past Surgical History:  Procedure Laterality Date  . ABDOMINAL HYSTERECTOMY  1990   for endometrial cancer  . CATARACT EXTRACTION W/ INTRAOCULAR LENS  IMPLANT, BILATERAL    . [place]  . GUM SURGERY  1932  .  MASTOIDECTOMY  1920   bilateral  . THYROIDECTOMY  1960  . TONSILLECTOMY  1916    Allergies  Allergen Reactions  . Amoxicillin     Unknown, patient unable to answer questionnaire   . Avelox [Moxifloxacin]     Unknown: listed on MAR  . Erythromycin     Unknown: listed on MAR  . Monistat [Miconazole]     Unknown: listed on MAR  . Morphine And Related      Unknown: listed on MAR  . Orange Juice [Orange Oil]     Unknown: listed on MAR    Allergies as of 08/12/2017      Reactions   Amoxicillin    Unknown, patient unable to answer questionnaire    Avelox [moxifloxacin]    Unknown: listed on MAR   Erythromycin    Unknown: listed on MAR   Monistat [miconazole]    Unknown: listed on MAR   Morphine And Related    Unknown: listed on MAR   Orange Juice [orange Oil]    Unknown: listed on [facility]      Medication List       Accurate as of 08/12/17  3:21 PM. Always use your most recent med list.          acetaminophen 325 MG tablet Commonly known as:  TYLENOL Take 650 mg by mouth. Take 2 tablets once a day 10:00 pm. Take 2 tablets every 4 hours as needed for pain or headache   alum & mag hydroxide-simeth 200-200-20 MG/5ML suspension Commonly known as:  MAALOX/MYLANTA Take 30 mLs by mouth every 6 (six) hours as needed for indigestion or heartburn.   aspirin 81 MG chewable tablet Chew 81 mg by mouth daily.   B-complex with vitamin C tablet Take 1 tablet by mouth daily.   calcium citrate-vitamin D 315-200 MG-UNIT tablet Commonly known as:  CITRACAL+D Take 1 tablet by mouth daily.   clotrimazole-betamethasone cream Commonly known as:  LOTRISONE Apply 1 application topically 2 (two) times daily.   docusate sodium 100 MG capsule Commonly known as:  COLACE Take 200 mg by mouth daily.   EQL CENTURY MATURE Tabs Take 1 tablet by mouth daily.   fexofenadine 60 MG tablet Commonly known as:  ALLEGRA Take 60 mg by mouth daily.   furosemide 20 MG tablet Commonly known as:  LASIX Take 1 tablet by mouth daily.   ipratropium-albuterol 0.5-2.5 (3) MG/3ML Soln Commonly known as:  DUONEB Take 3 mLs by nebulization. Duonebs twice a day   levothyroxine 150 MCG tablet Commonly known as:  SYNTHROID, LEVOTHROID Take 1 tablet by mouth daily.   Melatonin 3 MG Tabs Take 1 tablet by mouth at bedtime.   MILK OF MAGNESIA PO Take 30 mLs  by mouth daily as needed.   nystatin powder Generic drug:  nystatin Apply topically 4 (four) times daily. Apply to buttock as needed.   potassium chloride 10 MEQ tablet Commonly known as:  K-DUR,KLOR-CON Take 1 tablet by mouth daily.       Review of Systems  Constitutional: Negative for activity change, appetite change, chills, diaphoresis, fatigue and fever.  Genitourinary:       Pads for urinary leakage.   Musculoskeletal: Positive for arthralgias, back pain and gait problem.       Ambulates with walker for a short distance, w/c to go further. Stays in her recliner most of time during day and sleeps in it at night.   Skin: Positive for rash and wound. Negative for color  change and pallor.       Buttocks.   Psychiatric/Behavioral: Negative for agitation, behavioral problems, confusion and sleep disturbance. The patient is not nervous/anxious.     Immunization History  Administered Date(s) Administered  . Influenza Whole 10/19/2012  . Influenza-Unspecified 10/18/2013, 10/17/2014, 09/02/2015, 09/29/2016  . PPD Test 01/22/2013, 11/28/2015  . Pneumococcal Polysaccharide-23 01/22/2013  . Td 02/21/2009  . Tdap 04/07/2015   Pertinent  Health Maintenance Due  Topic Date Due  . DEXA SCAN  03/25/1978  . PNA vac Low Risk Adult (2 of 2 - PCV13) 01/22/2014  . INFLUENZA VACCINE  07/13/2017   Fall Risk  11/14/2015 04/17/2015 09/26/2014 09/26/2014  Falls in the past year? No Yes No Yes  Number falls in past yr: - 1 - 2 or more  Injury with Fall? - Yes - -  Comment - laceration forehead 04/07/15 - -  Risk Factor Category  - High Fall Risk - -  Risk for fall due to : Impaired mobility;Impaired vision - - -   Functional Status Survey:    Vitals:   08/12/17 1508  BP: 130/70  Pulse: 76  Resp: 18  Temp: (!) 97.2 F (36.2 C)  Weight: 152 lb 3.2 oz (69 kg)  Height: 5' (1.524 m)   Body mass index is 29.72 kg/m. Physical Exam  Constitutional: She is oriented to person, place, and  time. She appears well-developed and well-nourished. No distress.  Neurological: She is alert and oriented to person, place, and time.  Skin: Skin is warm and dry. Rash noted. She is not diaphoretic. There is erythema. No pallor.  1/3 of the R+L buttock reddened with a few small open areas, moist, but no bleeding, no odorous drainage    Labs reviewed:  Recent Labs  10/21/16 04/19/17  NA 138 139  K 4.5 4.1  BUN 29* 30*  CREATININE 0.8 0.9    Recent Labs  10/21/16 04/19/17  AST 24 16  ALT 11 14  ALKPHOS 53 55    Recent Labs  10/21/16 04/19/17  WBC 7.3 6.4  HGB 14.7 13.4  HCT 45 39  PLT 182 170   Lab Results  Component Value Date   TSH 0.52 04/19/2017   No results found for: HGBA1C No results found for: CHOL, HDL, LDLCALC, LDLDIRECT, TRIG, CHOLHDL  Significant Diagnostic Results in last 30 days:  No results found.  Assessment/Plan: Pressure ulcer, stage 2 Stage II pressure ulcer R+L buttock presented as small openings in the reddened excoriated areas, no s/s of infection or bleeding. The pressure in setting of heat/moist is etiology, will encourage the patient change her position while in her recliner frequently for pressure reduction to the areas. Will assist the patient with better personal care. Will treat heat/moist related skin yeast infection. observe  Candidiasis, skin or nails R+L buttocks, heat and moist ares contributory, reddened and excoriated appearance, will apply Mycolog II cream bid until healed. Assist with patient with personal hygiene, position change.     Family/ staff Communication: plan of care reviewed with the patient and charge nurse  Labs/tests ordered:  none  Time spend 25 minutes

## 2017-08-12 NOTE — Assessment & Plan Note (Signed)
R+L buttocks, heat and moist ares contributory, reddened and excoriated appearance, will apply Mycolog II cream bid until healed. Assist with patient with personal hygiene, position change.

## 2017-08-26 ENCOUNTER — Non-Acute Institutional Stay: Payer: Medicare Other | Admitting: Internal Medicine

## 2017-08-26 ENCOUNTER — Encounter: Payer: Self-pay | Admitting: Internal Medicine

## 2017-08-26 DIAGNOSIS — I10 Essential (primary) hypertension: Secondary | ICD-10-CM | POA: Diagnosis not present

## 2017-08-26 DIAGNOSIS — E039 Hypothyroidism, unspecified: Secondary | ICD-10-CM

## 2017-08-26 DIAGNOSIS — J301 Allergic rhinitis due to pollen: Secondary | ICD-10-CM

## 2017-08-26 DIAGNOSIS — J449 Chronic obstructive pulmonary disease, unspecified: Secondary | ICD-10-CM | POA: Diagnosis not present

## 2017-08-26 DIAGNOSIS — R609 Edema, unspecified: Secondary | ICD-10-CM | POA: Diagnosis not present

## 2017-08-26 DIAGNOSIS — H9193 Unspecified hearing loss, bilateral: Secondary | ICD-10-CM | POA: Diagnosis not present

## 2017-08-26 DIAGNOSIS — L89302 Pressure ulcer of unspecified buttock, stage 2: Secondary | ICD-10-CM

## 2017-08-26 NOTE — Progress Notes (Signed)
Location:  [place] Room Number: AL- 923 Place of Service:  ALF [phone]) Provider:  Blanchie Serve MD  Blanchie Serve, MD  Patient Care Team: Blanchie Serve, MD as PCP - General (Internal Medicine) [name], MD (Internal Medicine) [place], Man X, NP as Nurse Practitioner (Nurse Practitioner) [place], Man X, NP as Nurse Practitioner (Nurse Practitioner)  Extended Emergency Contact Information Primary Emergency Contact: [name] [place] of [place] Phone: [phone] Relation: Son  Code Status:  DNR Goals of care: Advanced Directive information Advanced Directives 08/26/2017  Does Patient Have a Medical Advance Directive? Yes  Type of Paramedic of [place];Living will;Out of facility DNR (pink MOST or yellow form)  Does patient want to make changes to medical advance directive? No - Patient declined  Copy of [place] in Chart? Yes  Would patient like information on creating a medical advance directive? -  Pre-existing out of facility DNR order (yellow form or pink MOST form) Yellow form placed in chart (order not valid for inpatient use)     Chief Complaint  Patient presents with  . Medical Management of Chronic Issues    Routine Visit     HPI:  Pt is a 83 y.o. female seen today for medical management of chronic diseases. She sleeps in her recliner. She has chronic leg edema that is present but stable. She had a fall on 9/10 18 while self transferring herself out of wheelchair during which her pillow slipped and she fell, no injury reported. She is very hard of hearing. No behavioral issue reported by nursing. She gets around with her wheelchair and goes to [employer], dining area and beauty shop. She uses her walker for short distance mobility. She feeds herself. No skin concern per nursing.    Past Medical History:  Diagnosis Date  . Abnormal  liver function tests    11/11/15 AST 31, ALT 88, alk phos 96   . Acute kidney failure, unspecified ([place]) 01/24/2012  . Arthralgia 09/26/2014   Multiple joints: knees, shoulders, wrists, spine hips   . Arthritis   . Candidiasis of other urogenital sites 08/10/2012  . Cerebral embolism 05/23/2005  . CHF (congestive heart failure) ([place])   . Cholelithiasis 11/04/2015  . Closed fracture of sacrum and coccyx without mention of spinal cord injury 02/14/2012  . Constipation 05/03/2013  . Contusion of face, scalp, and neck except eye(s) 06/01/2012  . Contusion of wrist 06/01/2012  . COPD (chronic obstructive pulmonary disease) ([place])   . COPD, mild ([place]) 01/20/2013  . Edema 04/20/2012  . H/O: CVA (cerebrovascular accident)   . Hearing loss 09/26/2014  . History of cancer of uterus   . HTN (hypertension), benign   . Hyperglycemia 11/14/2015  . Hypothyroidism   . Insomnia, unspecified 08/10/2012  . Open wound of knee, leg (except thigh), and ankle, without mention of complication 08/19/5882  . Other and unspecified hyperlipidemia 06/01/2012  . Other disorder of coccyx 01/27/2012  . Pain in joint, ankle and foot 06/14/2012  . Peripheral vascular disease, unspecified ([place]) 01/27/2012  . Personal history of fall 01/27/2012  . Pneumonia, organism unspecified([place]) 01/23/2005  . Rheumatic fever 09/26/1933   Age 37 10/01/14 ESR 13, RAF <10   . Seasonal allergies 05/03/2013  . Stasis dermatitis of both legs 09/26/2014  . Unspecified constipation 11/02/2012  . Unspecified hearing loss 02/08/2013  . Unspecified hereditary and idiopathic peripheral neuropathy 01/27/2012  . Urinary frequency  02/24/2014  . Ventricular fibrillation ([place]) 01/24/2012   Past Surgical History:  Procedure Laterality Date  . ABDOMINAL HYSTERECTOMY  1990   for endometrial cancer  . CATARACT EXTRACTION W/ INTRAOCULAR LENS  IMPLANT, BILATERAL    . [place]  . GUM SURGERY  1932  . MASTOIDECTOMY  1920   bilateral  .  THYROIDECTOMY  1960  . TONSILLECTOMY  1916    Allergies  Allergen Reactions  . Amoxicillin     Unknown, patient unable to answer questionnaire   . Avelox [Moxifloxacin]     Unknown: listed on MAR  . Erythromycin     Unknown: listed on MAR  . Monistat [Miconazole]     Unknown: listed on MAR  . Morphine And Related     Unknown: listed on MAR  . Orange Juice [Orange Oil]     Unknown: listed on [facility]    Outpatient Encounter Prescriptions as of 08/26/2017  Medication Sig  . acetaminophen (TYLENOL) 325 MG tablet Take 650 mg by mouth. Take 2 tablets once a day 10:00 pm. Take 2 tablets every 4 hours as needed for pain or headache  . alum & mag hydroxide-simeth (MAALOX/MYLANTA) 200-200-20 MG/5ML suspension Take 30 mLs by mouth every 6 (six) hours as needed for indigestion or heartburn.  Marland Kitchen aspirin 81 MG chewable tablet Chew 81 mg by mouth daily.  . B Complex-C (B-COMPLEX WITH VITAMIN C) tablet Take 1 tablet by mouth daily.  . calcium citrate-vitamin D (CITRACAL+D) 315-200 MG-UNIT per tablet Take 1 tablet by mouth daily.  . clotrimazole-betamethasone (LOTRISONE) cream Apply 1 application topically 2 (two) times daily as needed.   . docusate sodium (COLACE) 100 MG capsule Take 200 mg by mouth daily.   . fexofenadine (ALLEGRA) 60 MG tablet Take 60 mg by mouth daily.  . furosemide (LASIX) 20 MG tablet Take 1 tablet by mouth daily.  Marland Kitchen ipratropium-albuterol (DUONEB) 0.5-2.5 (3) MG/3ML SOLN Take 3 mLs by nebulization 2 (two) times daily.   Marland Kitchen levothyroxine (SYNTHROID, LEVOTHROID) 150 MCG tablet Take 150 mcg by mouth daily.   . Magnesium Hydroxide (MILK OF MAGNESIA PO) Take 30 mLs by mouth daily as needed.  . Melatonin 3 MG TABS Take 1 tablet by mouth at bedtime.  . Multiple Vitamins-Minerals (EQL CENTURY MATURE) TABS Take 1 tablet by mouth daily.   Marland Kitchen nystatin-triamcinolone (MYCOLOG II) cream Apply 1 application topically 2 (two) times daily.  . polyvinyl alcohol (LIQUIFILM TEARS) 1.4 % ophthalmic  solution Place 1 drop into both eyes 2 (two) times daily.  . potassium chloride (K-DUR,KLOR-CON) 10 MEQ tablet Take 10 mEq by mouth daily.   . [DISCONTINUED] nystatin (NYSTATIN) powder Apply topically 4 (four) times daily. Apply to buttock as needed.   No facility-administered encounter medications on file as of 08/26/2017.     Review of Systems  Constitutional: Negative for appetite change and fever.  HENT: Positive for hearing loss. Negative for congestion, mouth sores, sore throat and trouble swallowing.   Respiratory: Negative for cough and shortness of breath.   Cardiovascular: Positive for leg swelling. Negative for chest pain and palpitations.  Gastrointestinal: Negative for abdominal pain, constipation, diarrhea, nausea and vomiting.  Genitourinary: Negative for dysuria.  Musculoskeletal: Positive for arthralgias and gait problem.  Neurological: Negative for dizziness, seizures and headaches.  Hematological: Bruises/bleeds easily.  Psychiatric/Behavioral: Negative for behavioral problems.    Immunization History  Administered Date(s) Administered  . Influenza Whole 10/19/2012  . Influenza-Unspecified 10/18/2013, 10/17/2014, 09/02/2015, 09/29/2016  . PPD Test  01/22/2013, 11/28/2015  . Pneumococcal Polysaccharide-23 01/22/2013  . Td 02/21/2009  . Tdap 04/07/2015   Pertinent  Health Maintenance Due  Topic Date Due  . DEXA SCAN  03/25/1978  . INFLUENZA VACCINE  09/12/2017 (Originally 07/13/2017)  . PNA vac Low Risk Adult (2 of 2 - PCV13) 10/13/2017 (Originally 01/22/2014)   Fall Risk  11/14/2015 04/17/2015 09/26/2014 09/26/2014  Falls in the past year? No Yes No Yes  Number falls in past yr: - 1 - 2 or more  Injury with Fall? - Yes - -  Comment - laceration forehead 04/07/15 - -  Risk Factor Category  - High Fall Risk - -  Risk for fall due to : Impaired mobility;Impaired vision - - -   Functional Status Survey:    Vitals:   08/26/17 1146  BP: (!) 150/78  Pulse: 81    Resp: 18  Temp: 97.8 F (36.6 C)  TempSrc: Oral  SpO2: 95%  Weight: 154 lb 3.2 oz (69.9 kg)  Height: 5' (1.524 m)   Body mass index is 30.12 kg/m.   Wt Readings from Last 3 Encounters:  08/26/17 154 lb 3.2 oz (69.9 kg)  08/12/17 152 lb 3.2 oz (69 kg)  05/18/17 148 lb 8 oz (67.4 kg)    Physical Exam  Constitutional: She is oriented to person, place, and time.  Obese, elderly female, in no acute distress  HENT:  Head: Normocephalic and atraumatic.  Mouth/Throat: Oropharynx is clear and moist.  Eyes: Pupils are equal, round, and reactive to light. Conjunctivae and EOM are normal.  Neck: Normal range of motion. Neck supple.  Cardiovascular: Normal rate and regular rhythm.   Pulmonary/Chest: Effort normal and breath sounds normal. No respiratory distress. She has no wheezes. She has no rales.  Abdominal: She exhibits no distension. There is no tenderness. There is no rebound.  Musculoskeletal: She exhibits edema.  Able to move all 4 extremities, unsteady gait, uses walker and wheelchair, arthritis changes +  Lymphadenopathy:    She has no cervical adenopathy.  Neurological: She is alert and oriented to person, place, and time.  Skin: Skin is warm and dry. No rash noted. She is not diaphoretic. No erythema.  Stage 2 pressure ulcer buttock per nursing, not visulaized  Psychiatric: She has a normal mood and affect.    Labs reviewed:  Recent Labs  10/21/16 04/19/17  NA 138 139  K 4.5 4.1  BUN 29* 30*  CREATININE 0.8 0.9    Recent Labs  10/21/16 04/19/17  AST 24 16  ALT 11 14  ALKPHOS 53 55    Recent Labs  10/21/16 04/19/17  WBC 7.3 6.4  HGB 14.7 13.4  HCT 45 39  PLT 182 170   Lab Results  Component Value Date   TSH 0.52 04/19/2017   No results found for: HGBA1C No results found for: CHOL, HDL, LDLCALC, LDLDIRECT, TRIG, CHOLHDL  Significant Diagnostic Results in last 30 days:  No results found.  Assessment/Plan  Allergic rhinitis Appears stable on  daily fexofenadine, continue and monitor  Copd Breathing stable on room air, not on o2, continue duoneb treatment bid  Hearing loss Severe, age related, supportive care  Hypothyroidism Reviewed tsh, continue levothyroxine 150 mcg daily  Chronic leg edema From stasis with peripheral edema, to keep legs elevated at rest, continue lasix and kcl, monitor bmp periodically  Hypertension Controlled BP, not on any antihypertensive, continue aspirin and monitor  Stage 2 PU Need pressure ulcer prophylaxis. Frequent positioning and encouraged to  be out of recliner. Has gel cushion to wheelchair, refuses to stay in wheelchair. Continue skin care and monitor  Family/ staff Communication: reviewed care plan with patient and charge nurse.    Labs/tests ordered:  none   Blanchie Serve, MD Internal Medicine [facility] [address] [place],  [address] Cell Phone (Monday-Friday 8 am - 5 pm): [phone] On Call: [phone] and follow prompts after 5 pm and on weekends Office Phone: [phone] Office Fax: [phone]

## 2017-09-02 ENCOUNTER — Non-Acute Institutional Stay: Payer: Medicare Other

## 2017-09-02 DIAGNOSIS — Z0000 Encounter for general adult medical examination without abnormal findings: Secondary | ICD-10-CM | POA: Diagnosis not present

## 2017-09-02 NOTE — Patient Instructions (Signed)
[name] , Thank you for taking time to come for your Medicare Wellness Visit. I appreciate your ongoing commitment to your health goals. Please review the following plan we discussed and let me know if I can assist you in the future.   Screening recommendations/referrals: Colonoscopy excluded, pt is over age 81 Mammogram excluded, pt is over age 70 Bone Density due, not ordered due to age Recommended yearly ophthalmology/optometry visit for glaucoma screening and checkup Recommended yearly dental visit for hygiene and checkup  Vaccinations: Influenza vaccine due Pneumococcal vaccine 13 due Tdap vaccine up to date. Due 04/06/2025 Shingles vaccine not in records    Advanced directives: In Chart  Conditions/risks identified: None  Next appointment: Dr. Glade Lloyd makes rounds   Preventive Care 68 Years and Older, Female Preventive care refers to lifestyle choices and visits with your health care provider that can promote health and wellness. What does preventive care include?  A yearly physical exam. This is also called an annual well check.  Dental exams once or twice a year.  Routine eye exams. Ask your health care provider how often you should have your eyes checked.  Personal lifestyle choices, including:  Daily care of your teeth and gums.  Regular physical activity.  Eating a healthy diet.  Avoiding tobacco and drug use.  Limiting alcohol use.  Practicing safe sex.  Taking low-dose aspirin every day.  Taking vitamin and mineral supplements as recommended by your health care provider. What happens during an annual well check? The services and screenings done by your health care provider during your annual well check will depend on your age, overall health, lifestyle risk factors, and family history of disease. Counseling  Your health care provider may ask you questions about your:  Alcohol use.  Tobacco use.  Drug use.  Emotional well-being.  Home and  relationship well-being.  Sexual activity.  Eating habits.  History of falls.  Memory and ability to understand (cognition).  Work and work Astronomer.  Reproductive health. Screening  You may have the following tests or measurements:  Height, weight, and BMI.  Blood pressure.  Lipid and cholesterol levels. These may be checked every 5 years, or more frequently if you are over 106 years old.  Skin check.  Lung cancer screening. You may have this screening every year starting at age 5 if you have a 30-pack-year history of smoking and currently smoke or have quit within the past 15 years.  Fecal occult blood test (FOBT) of the stool. You may have this test every year starting at age 83.  Flexible sigmoidoscopy or colonoscopy. You may have a sigmoidoscopy every 5 years or a colonoscopy every 10 years starting at age 84.  Hepatitis C blood test.  Hepatitis B blood test.  Sexually transmitted disease (STD) testing.  Diabetes screening. This is done by checking your blood sugar (glucose) after you have not eaten for a while (fasting). You may have this done every 1-3 years.  Bone density scan. This is done to screen for osteoporosis. You may have this done starting at age 65.  Mammogram. This may be done every 1-2 years. Talk to your health care provider about how often you should have regular mammograms. Talk with your health care provider about your test results, treatment options, and if necessary, the need for more tests. Vaccines  Your health care provider may recommend certain vaccines, such as:  Influenza vaccine. This is recommended every year.  Tetanus, diphtheria, and acellular pertussis (Tdap, Td) vaccine.  You may need a Td booster every 10 years.  Zoster vaccine. You may need this after age 24.  Pneumococcal 13-valent conjugate (PCV13) vaccine. One dose is recommended after age 42.  Pneumococcal polysaccharide (PPSV23) vaccine. One dose is recommended after  age 68. Talk to your health care provider about which screenings and vaccines you need and how often you need them. This information is not intended to replace advice given to you by your health care provider. Make sure you discuss any questions you have with your health care provider. Document Released: 12/26/2015 Document Revised: 08/18/2016 Document Reviewed: 09/30/2015 Elsevier Interactive Patient Education  2017 [place] Prevention in the Home Falls can cause injuries. They can happen to people of all ages. There are many things you can do to make your home safe and to help prevent falls. What can I do on the outside of my home?  Regularly fix the edges of walkways and driveways and fix any cracks.  Remove anything that might make you trip as you walk through a door, such as a raised step or threshold.  Trim any bushes or trees on the path to your home.  Use bright outdoor lighting.  Clear any walking paths of anything that might make someone trip, such as rocks or tools.  Regularly check to see if handrails are loose or broken. Make sure that both sides of any steps have handrails.  Any raised decks and porches should have guardrails on the edges.  Have any leaves, snow, or ice cleared regularly.  Use sand or salt on walking paths during winter.  Clean up any spills in your garage right away. This includes oil or grease spills. What can I do in the bathroom?  Use night lights.  Install grab bars by the toilet and in the tub and shower. Do not use towel bars as grab bars.  Use non-skid mats or decals in the tub or shower.  If you need to sit down in the shower, use a plastic, non-slip stool.  Keep the floor dry. Clean up any water that spills on the floor as soon as it happens.  Remove soap buildup in the tub or shower regularly.  Attach bath mats securely with double-sided non-slip rug tape.  Do not have throw rugs and other things on the floor that can  make you trip. What can I do in the bedroom?  Use night lights.  Make sure that you have a light by your bed that is easy to reach.  Do not use any sheets or blankets that are too big for your bed. They should not hang down onto the floor.  Have a firm chair that has side arms. You can use this for support while you get dressed.  Do not have throw rugs and other things on the floor that can make you trip. What can I do in the kitchen?  Clean up any spills right away.  Avoid walking on wet floors.  Keep items that you use a lot in easy-to-reach places.  If you need to reach something above you, use a strong step stool that has a grab bar.  Keep electrical cords out of the way.  Do not use floor polish or wax that makes floors slippery. If you must use wax, use non-skid floor wax.  Do not have throw rugs and other things on the floor that can make you trip. What can I do with my stairs?  Do not leave any  items on the stairs.  Make sure that there are handrails on both sides of the stairs and use them. Fix handrails that are broken or loose. Make sure that handrails are as long as the stairways.  Check any carpeting to make sure that it is firmly attached to the stairs. Fix any carpet that is loose or worn.  Avoid having throw rugs at the top or bottom of the stairs. If you do have throw rugs, attach them to the floor with carpet tape.  Make sure that you have a light switch at the top of the stairs and the bottom of the stairs. If you do not have them, ask someone to add them for you. What else can I do to help prevent falls?  Wear shoes that:  Do not have high heels.  Have rubber bottoms.  Are comfortable and fit you well.  Are closed at the toe. Do not wear sandals.  If you use a stepladder:  Make sure that it is fully opened. Do not climb a closed stepladder.  Make sure that both sides of the stepladder are locked into place.  Ask someone to hold it for you,  if possible.  Clearly mark and make sure that you can see:  Any grab bars or handrails.  First and last steps.  Where the edge of each step is.  Use tools that help you move around (mobility aids) if they are needed. These include:  Canes.  Walkers.  Scooters.  Crutches.  Turn on the lights when you go into a dark area. Replace any light bulbs as soon as they burn out.  Set up your furniture so you have a clear path. Avoid moving your furniture around.  If any of your floors are uneven, fix them.  If there are any pets around you, be aware of where they are.  Review your medicines with your doctor. Some medicines can make you feel dizzy. This can increase your chance of falling. Ask your doctor what other things that you can do to help prevent falls. This information is not intended to replace advice given to you by your health care provider. Make sure you discuss any questions you have with your health care provider. Document Released: 09/25/2009 Document Revised: 05/06/2016 Document Reviewed: 01/03/2015 Elsevier Interactive Patient Education  2017 [employer].

## 2017-09-02 NOTE — Progress Notes (Signed)
Subjective:   [name] is a 83 y.o. female who presents for Medicare Annual (Subsequent) preventive examination at [facility] AWV-12/30/2009 Objective:     Vitals: BP 132/60 (BP Location: Left Arm, Patient Position: Sitting)   Pulse 84   Temp 98.3 F (36.8 C) (Oral)   Ht 5' (1.524 m)   Wt 152 lb (68.9 kg)   SpO2 96%   BMI 29.69 kg/m   Body mass index is 29.69 kg/m.   Tobacco History  Smoking Status  . Never Smoker  Smokeless Tobacco  . Never Used     Counseling given: Not Answered   Past Medical History:  Diagnosis Date  . Abnormal liver function tests    11/11/15 AST 31, ALT 88, alk phos 96   . Acute kidney failure, unspecified ([place]) 01/24/2012  . Arthralgia 09/26/2014   Multiple joints: knees, shoulders, wrists, spine hips   . Arthritis   . Candidiasis of other urogenital sites 08/10/2012  . Cerebral embolism 05/23/2005  . CHF (congestive heart failure) ([place])   . Cholelithiasis 11/04/2015  . Closed fracture of sacrum and coccyx without mention of spinal cord injury 02/14/2012  . Constipation 05/03/2013  . Contusion of face, scalp, and neck except eye(s) 06/01/2012  . Contusion of wrist 06/01/2012  . COPD (chronic obstructive pulmonary disease) ([place])   . COPD, mild ([place]) 01/20/2013  . Edema 04/20/2012  . H/O: CVA (cerebrovascular accident)   . Hearing loss 09/26/2014  . History of cancer of uterus   . HTN (hypertension), benign   . Hyperglycemia 11/14/2015  . Hypothyroidism   . Insomnia, unspecified 08/10/2012  . Open wound of knee, leg (except thigh), and ankle, without mention of complication 7/0/0174  . Other and unspecified hyperlipidemia 06/01/2012  . Other disorder of coccyx 01/27/2012  . Pain in joint, ankle and foot 06/14/2012  . Peripheral vascular disease, unspecified ([place]) 01/27/2012  . Personal history of fall 01/27/2012  . Pneumonia, organism unspecified(486) 01/23/2005  . Rheumatic fever 09/26/1933   Age 39  10/01/14 ESR 13, RAF <10   . Seasonal allergies 05/03/2013  . Stasis dermatitis of both legs 09/26/2014  . Unspecified constipation 11/02/2012  . Unspecified hearing loss 02/08/2013  . Unspecified hereditary and idiopathic peripheral neuropathy 01/27/2012  . Urinary frequency 02/24/2014  . Ventricular fibrillation ([place]) 01/24/2012   Past Surgical History:  Procedure Laterality Date  . ABDOMINAL HYSTERECTOMY  1990   for endometrial cancer  . CATARACT EXTRACTION W/ INTRAOCULAR LENS  IMPLANT, BILATERAL    . [place]  . GUM SURGERY  1932  . MASTOIDECTOMY  1920   bilateral  . THYROIDECTOMY  1960  . TONSILLECTOMY  1916   Family History  Problem Relation Age of Onset  . Stroke Mother   . Heart disease Father    History  Sexual Activity  . Sexual activity: No    Outpatient Encounter Prescriptions as of 09/02/2017  Medication Sig  . acetaminophen (TYLENOL) 325 MG tablet Take 650 mg by mouth. Take 2 tablets once a day 10:00 pm. Take 2 tablets every 4 hours as needed for pain or headache  . alum & mag hydroxide-simeth (MAALOX/MYLANTA) 200-200-20 MG/5ML suspension Take 30 mLs by mouth every 6 (six) hours as needed for indigestion or heartburn.  Marland Kitchen aspirin 81 MG chewable tablet Chew 81 mg by mouth daily.  . B Complex-C (B-COMPLEX WITH VITAMIN C) tablet Take 1 tablet by mouth daily.  . calcium  citrate-vitamin D (CITRACAL+D) 315-200 MG-UNIT per tablet Take 1 tablet by mouth daily.  . clotrimazole-betamethasone (LOTRISONE) cream Apply 1 application topically 2 (two) times daily as needed.   . docusate sodium (COLACE) 100 MG capsule Take 200 mg by mouth daily.   . fexofenadine (ALLEGRA) 60 MG tablet Take 60 mg by mouth daily.  . furosemide (LASIX) 20 MG tablet Take 1 tablet by mouth daily.  Marland Kitchen ipratropium-albuterol (DUONEB) 0.5-2.5 (3) MG/3ML SOLN Take 3 mLs by nebulization 2 (two) times daily.   Marland Kitchen levothyroxine (SYNTHROID, LEVOTHROID) 150 MCG tablet Take 150 mcg by mouth  daily.   . Magnesium Hydroxide (MILK OF MAGNESIA PO) Take 30 mLs by mouth daily as needed.  . Melatonin 3 MG TABS Take 1 tablet by mouth at bedtime.  . Multiple Vitamins-Minerals (EQL CENTURY MATURE) TABS Take 1 tablet by mouth daily.   Marland Kitchen nystatin-triamcinolone (MYCOLOG II) cream Apply 1 application topically 2 (two) times daily.  . polyvinyl alcohol (LIQUIFILM TEARS) 1.4 % ophthalmic solution Place 1 drop into both eyes 2 (two) times daily.  . potassium chloride (K-DUR,KLOR-CON) 10 MEQ tablet Take 10 mEq by mouth daily.    No facility-administered encounter medications on file as of 09/02/2017.     Activities of Daily Living In your present state of health, do you have any difficulty performing the following activities: 09/02/2017  Hearing? Y  Vision? Y  Difficulty concentrating or making decisions? N  Walking or climbing stairs? Y  Dressing or bathing? Y  Doing errands, shopping? Y  Preparing Food and eating ? Y  Using the Toilet? Y  In the past six months, have you accidently leaked urine? Y  Do you have problems with loss of bowel control? N  Managing your Medications? Y  Managing your Finances? Y  Housekeeping or managing your Housekeeping? Y  Some recent data might be hidden    Patient Care Team: Blanchie Serve, MD as PCP - General (Internal Medicine) [name], MD (Internal Medicine) [place] Mast, Man X, NP as Nurse Practitioner (Nurse Practitioner) [place] Mast, Man X, NP as Nurse Practitioner (Nurse Practitioner)    Assessment:     Exercise Activities and Dietary recommendations Current Exercise Habits: The patient does not participate in regular exercise at present, Exercise limited by: orthopedic condition(s)  Goals    None     Fall Risk Fall Risk  09/02/2017 11/14/2015 04/17/2015 09/26/2014 09/26/2014  Falls in the past year? No No Yes No Yes  Number falls in past yr: - - 1 - 2 or more  Injury with Fall? - - Yes - -    Comment - - laceration forehead 04/07/15 - -  Risk Factor Category  - - High Fall Risk - -  Risk for fall due to : - Impaired mobility;Impaired vision - - -   Depression Screen PHQ 2/9 Scores 09/02/2017 11/14/2015 09/26/2014 09/26/2014  PHQ - 2 Score 0 0 0 0     Cognitive Function     6CIT Screen 09/02/2017  What Year? 0 points  What month? 0 points  What time? 0 points  Count back from 20 0 points  Months in reverse 0 points  Repeat phrase 2 points  Total Score 2    Immunization History  Administered Date(s) Administered  . Influenza Whole 10/19/2012  . Influenza-Unspecified 10/18/2013, 10/17/2014, 09/02/2015, 09/29/2016  . PPD Test 01/22/2013, 11/28/2015  . Pneumococcal Polysaccharide-23 01/22/2013  . Td 02/21/2009  . Tdap 04/07/2015   Screening Tests  Health Maintenance  Topic Date Due  . DEXA SCAN  03/25/1978  . INFLUENZA VACCINE  09/12/2017 (Originally 07/13/2017)  . PNA vac Low Risk Adult (2 of 2 - PCV13) 10/13/2017 (Originally 01/22/2014)  . TETANUS/TDAP  04/06/2025      Plan:    I have personally reviewed and addressed the Medicare Annual Wellness questionnaire and have noted the following in the patient's chart:  A. Medical and social history B. Use of alcohol, tobacco or illicit drugs  C. Current medications and supplements D. Functional ability and status E.  Nutritional status F.  Physical activity G. Advance directives H. List of other physicians I.  Hospitalizations, surgeries, and ER visits in previous 12 months J.  [place] to include hearing, vision, cognitive, depression L. Referrals and appointments - none  In addition, I have reviewed and discussed with patient certain preventive protocols, quality metrics, and best practice recommendations. A written personalized care plan for preventive services as well as general preventive health recommendations were provided to patient.  See attached scanned questionnaire for additional  information.   Signed,   Rich Reining, RN Nurse Health Advisor   Quick Notes   Health Maintenance: Prevnar, DEXA, flu vaccine due. DEXA not ordered due to pt age.     Abnormal Screen: None     Patient Concerns: None     Nurse Concerns: None

## 2017-11-16 ENCOUNTER — Encounter: Payer: Self-pay | Admitting: Nurse Practitioner

## 2017-11-16 ENCOUNTER — Non-Acute Institutional Stay: Payer: Medicare Other | Admitting: Nurse Practitioner

## 2017-11-16 DIAGNOSIS — M2550 Pain in unspecified joint: Secondary | ICD-10-CM | POA: Diagnosis not present

## 2017-11-16 DIAGNOSIS — K5900 Constipation, unspecified: Secondary | ICD-10-CM | POA: Diagnosis not present

## 2017-11-16 DIAGNOSIS — R609 Edema, unspecified: Secondary | ICD-10-CM

## 2017-11-16 DIAGNOSIS — E039 Hypothyroidism, unspecified: Secondary | ICD-10-CM

## 2017-11-16 NOTE — Assessment & Plan Note (Signed)
Hypothyroidism, continue Levothyroxine 150mcg po daily, last TSH 0.52 04/19/17, update CBC CMP TSH

## 2017-11-16 NOTE — Progress Notes (Signed)
Location:  [facility] Room Number: 824 Place of Service:  ALF [phone]) Provider:  Isabela Nardelli, Manxie  NP  Blanchie Serve, MD  Patient Care Team: Blanchie Serve, MD as PCP - General (Internal Medicine) [name], MD (Internal Medicine) [place] Tasmine Hipwell X, NP as Nurse Practitioner (Nurse Practitioner) [place] Ola Raap X, NP as Nurse Practitioner (Nurse Practitioner)  Extended Emergency Contact Information Primary Emergency Contact: [name] [place] Phone: [phone] Relation: Son  Code Status:  DNR Goals of care: Advanced Directive information Advanced Directives 09/02/2017  Does Patient Have a Medical Advance Directive? Yes  Type of Paramedic of [place];Out of facility DNR (pink MOST or yellow form)  Does patient want to make changes to medical advance directive? No - Patient declined  Copy of [place] in Chart? Yes  Would patient like information on creating a medical advance directive? -  Pre-existing out of facility DNR order (yellow form or pink MOST form) Yellow form placed in chart (order not valid for inpatient use);Pink MOST form placed in chart (order not valid for inpatient use)     Chief Complaint  Patient presents with  . Medical Management of Chronic Issues    HPI:  Pt is a 83 y.o. female seen today for medical management of chronic diseases.    The patient has history of edema in BLE, managed with Furosemide 60m qd, Kcl 135m po daily. Hypothyroidism, on Levothyroxine 15027mpo daily, last TSH 0.52 04/19/17,  constipation is stable, on Colace 200m62m, daily prn MOM, multiple sites osteoarthritic pain is managed with Tylenol 650mg46mly, and q4h prn. She ambulates with walker for in her room, w/c to go further, she requires one person transfer.    Past Medical History:  Diagnosis Date  . Abnormal liver function tests    11/11/15  AST 31, ALT 88, alk phos 96   . Acute kidney failure, unspecified (HCC) [place]1/2013  . Arthralgia 09/26/2014   Multiple joints: knees, shoulders, wrists, spine hips   . Arthritis   . Candidiasis of other urogenital sites 08/10/2012  . Cerebral embolism 05/23/2005  . CHF (congestive heart failure) (HCC) [place] Cholelithiasis 11/04/2015  . Closed fracture of sacrum and coccyx without mention of spinal cord injury 02/14/2012  . Constipation 05/03/2013  . Contusion of face, scalp, and neck except eye(s) 06/01/2012  . Contusion of wrist 06/01/2012  . COPD (chronic obstructive pulmonary disease) (HCC) [place] COPD, mild (HCC) [place]/2014  . Edema 04/20/2012  . H/O: CVA (cerebrovascular accident)   . Hearing loss 09/26/2014  . History of cancer of uterus   . HTN (hypertension), benign   . Hyperglycemia 11/14/2015  . Hypothyroidism   . Insomnia, unspecified 08/10/2012  . Open wound of knee, leg (except thigh), and ankle, without mention of complication 04/21/19/0/8676ther and unspecified hyperlipidemia 06/01/2012  . Other disorder of coccyx 01/27/2012  . Pain in joint, ankle and foot 06/14/2012  . Peripheral vascular disease, unspecified (HCC) [place]4/2013  . Personal history of fall 01/27/2012  . Pneumonia, organism unspecified(486) 01/23/2005  . Rheumatic fever 09/26/1933   Age 75 10/01/14 ESR 13, RAF <10   . Seasonal allergies 05/03/2013  . Stasis dermatitis of both legs 09/26/2014  . Unspecified constipation 11/02/2012  . Unspecified hearing loss 02/08/2013  . Unspecified hereditary and idiopathic peripheral neuropathy 01/27/2012  . Urinary frequency 02/24/2014  . Ventricular fibrillation (HCC) [place]1/2013  Past Surgical History:  Procedure Laterality Date  . ABDOMINAL HYSTERECTOMY  1990   for endometrial cancer  . CATARACT EXTRACTION W/ INTRAOCULAR LENS  IMPLANT, BILATERAL    . [place]  . GUM SURGERY  1932  . MASTOIDECTOMY  1920   bilateral  . THYROIDECTOMY  1960  . TONSILLECTOMY   1916    Allergies  Allergen Reactions  . Amoxicillin     Unknown, patient unable to answer questionnaire   . Avelox [Moxifloxacin]     Unknown: listed on MAR  . Erythromycin     Unknown: listed on MAR  . Monistat [Miconazole]     Unknown: listed on MAR  . Morphine And Related     Unknown: listed on MAR  . Orange Juice [Orange Oil]     Unknown: listed on [facility]    Outpatient Encounter Medications as of 11/16/2017  Medication Sig  . acetaminophen (TYLENOL) 325 MG tablet Take 650 mg by mouth. Take 2 tablets once a day 10:00 pm. Take 2 tablets every 4 hours as needed for pain or headache  . alum & mag hydroxide-simeth (MAALOX/MYLANTA) 200-200-20 MG/5ML suspension Take 30 mLs by mouth every 6 (six) hours as needed for indigestion or heartburn.  Marland Kitchen aspirin 81 MG chewable tablet Chew 81 mg by mouth daily.  . B Complex-C (B-COMPLEX WITH VITAMIN C) tablet Take 1 tablet by mouth daily.  . calcium citrate-vitamin D (CITRACAL+D) 315-200 MG-UNIT per tablet Take 1 tablet by mouth daily.  . clotrimazole-betamethasone (LOTRISONE) cream Apply 1 application topically 2 (two) times daily as needed.   . docusate sodium (COLACE) 100 MG capsule Take 200 mg by mouth daily.   . fexofenadine (ALLEGRA) 60 MG tablet Take 60 mg by mouth daily.  . furosemide (LASIX) 20 MG tablet Take 1 tablet by mouth daily.  Marland Kitchen ipratropium-albuterol (DUONEB) 0.5-2.5 (3) MG/3ML SOLN Take 3 mLs by nebulization 2 (two) times daily.   Marland Kitchen levothyroxine (SYNTHROID, LEVOTHROID) 150 MCG tablet Take 150 mcg by mouth daily.   . Magnesium Hydroxide (MILK OF MAGNESIA PO) Take 30 mLs by mouth daily as needed.  . Melatonin 3 MG TABS Take 1 tablet by mouth at bedtime.  . Multiple Vitamins-Minerals (EQL CENTURY MATURE) TABS Take 1 tablet by mouth daily.   . polyvinyl alcohol (LIQUIFILM TEARS) 1.4 % ophthalmic solution Place 1 drop into both eyes 2 (two) times daily.  . potassium chloride (K-DUR,KLOR-CON) 10 MEQ tablet Take 10 mEq by mouth daily.     Marland Kitchen nystatin-triamcinolone (MYCOLOG II) cream Apply 1 application topically 2 (two) times daily.   No facility-administered encounter medications on file as of 11/16/2017.     Review of Systems  Constitutional: Negative for activity change, appetite change, chills, diaphoresis, fatigue and fever.  HENT: Positive for hearing loss. Negative for trouble swallowing and voice change.   Eyes: Negative for visual disturbance.  Respiratory: Negative for cough, choking, shortness of breath and wheezing.   Cardiovascular: Positive for leg swelling. Negative for chest pain and palpitations.  Gastrointestinal: Negative for abdominal distention, abdominal pain, anal bleeding, constipation, diarrhea, nausea and vomiting.  Endocrine: Negative for cold intolerance and heat intolerance.  Genitourinary: Negative for difficulty urinating, dysuria, frequency and urgency.       Urinary leakage occasionally.   Musculoskeletal: Positive for arthralgias and gait problem.  Skin: Negative for color change, pallor, rash and wound.  Neurological: Negative for tremors, speech difficulty, weakness and headaches.  Psychiatric/Behavioral: Negative for agitation, behavioral problems, confusion, hallucinations and  sleep disturbance. The patient is not nervous/anxious.     Immunization History  Administered Date(s) Administered  . Influenza Whole 10/19/2012  . Influenza-Unspecified 10/18/2013, 10/17/2014, 09/02/2015, 09/29/2016, 10/03/2017  . PPD Test 01/22/2013, 11/28/2015  . Pneumococcal Polysaccharide-23 01/22/2013  . Td 02/21/2009  . Tdap 04/07/2015   Pertinent  Health Maintenance Due  Topic Date Due  . DEXA SCAN  03/25/1978  . PNA vac Low Risk Adult (2 of 2 - PCV13) 01/22/2014  . INFLUENZA VACCINE  Completed   Fall Risk  09/02/2017 11/14/2015 04/17/2015 09/26/2014 09/26/2014  Falls in the past year? No No Yes No Yes  Number falls in past yr: - - 1 - 2 or more  Injury with Fall? - - Yes - -  Comment - -  laceration forehead 04/07/15 - -  Risk Factor Category  - - High Fall Risk - -  Risk for fall due to : - Impaired mobility;Impaired vision - - -   Functional Status Survey:    Vitals:   11/16/17 1252  BP: (!) 142/80  Pulse: 72  Resp: (!) 22  Temp: 98.4 F (36.9 C)  Weight: 152 lb (68.9 kg)  Height: _0  (1.575 m)   Body mass index is 27.8 kg/m. Physical Exam  Constitutional: She appears well-developed and well-nourished.  HENT:  Head: Normocephalic and atraumatic.  Eyes: Conjunctivae are normal. Pupils are equal, round, and reactive to light.  Neck: Normal range of motion. Neck supple. No JVD present. No thyromegaly present.  Cardiovascular: Normal rate and regular rhythm.  No murmur heard. Pulmonary/Chest: Effort normal and breath sounds normal. She has no wheezes. She has no rales.  Abdominal: Soft. She exhibits no distension. There is no tenderness.  Musculoskeletal: Normal range of motion. She exhibits edema. She exhibits no tenderness.  One person transfer, ambulates with walker for short distance, wheelchair to go further.   Neurological: She is alert. She exhibits abnormal muscle tone. Coordination normal.  Oriented to person and place.   Skin: Skin is warm and dry. No rash noted. No erythema. No pallor.  Psychiatric: She has a normal mood and affect. Her behavior is normal. Judgment and thought content normal.    Labs reviewed: Recent Labs    04/19/17  NA 139  K 4.1  BUN 30*  CREATININE 0.9   Recent Labs    04/19/17  AST 16  ALT 14  ALKPHOS 55   Recent Labs    04/19/17  WBC 6.4  HGB 13.4  HCT 39  PLT 170   Lab Results  Component Value Date   TSH 0.52 04/19/2017   No results found for: HGBA1C No results found for: CHOL, HDL, LDLCALC, LDLDIRECT, TRIG, CHOLHDL  Significant Diagnostic Results in last 30 days:  No results found.  Assessment/Plan Hypothyroidism Hypothyroidism, continue Levothyroxine 131mg po daily, last TSH 0.52 04/19/17, update  CBC CMP TSH  Edema edema in BLE, trace, continue Furosemide 28mqd, Kcl 1048mpo daily.  Constipation constipation is stable, continue Colace 200m62m, daily prn MOM  Arthralgia multiple sites osteoarthritic pain is managed with Tylenol 650mg38mly, and q4h prn. She ambulates with walker for in her room, w/c to go further, she requires one person transfer.        Family/ staff Communication: plan of care reviewed with the patient and charge nurse. Prevnar 13 prescription given  Labs/tests ordered:  CBC CMP TSH  Time spend 25 minutes.

## 2017-11-16 NOTE — Assessment & Plan Note (Signed)
multiple sites osteoarthritic pain is managed with Tylenol 650mg  daily, and q4h prn. She ambulates with walker for in her room, w/c to go further, she requires one person transfer.

## 2017-11-16 NOTE — Assessment & Plan Note (Signed)
constipation is stable, continue Colace 200mg  qd, daily prn MOM

## 2017-11-16 NOTE — Assessment & Plan Note (Signed)
edema in BLE, trace, continue Furosemide 20mg  qd, Kcl 10meq po daily.

## 2017-11-17 ENCOUNTER — Encounter: Payer: Self-pay | Admitting: Nurse Practitioner

## 2017-11-17 LAB — BASIC METABOLIC PANEL
BUN: 40 — AB (ref 4–21)
Creatinine: 1.1 (ref 0.5–1.1)
GLUCOSE: 94
POTASSIUM: 4.5 (ref 3.4–5.3)
SODIUM: 139 (ref 137–147)

## 2017-11-17 LAB — HEPATIC FUNCTION PANEL
ALK PHOS: 51 (ref 25–125)
ALT: 17 (ref 7–35)
AST: 22 (ref 13–35)
BILIRUBIN, TOTAL: 0.3

## 2017-11-17 LAB — TSH: TSH: 1.23 (ref 0.41–5.90)

## 2017-11-17 LAB — CBC AND DIFFERENTIAL
HEMATOCRIT: 41 (ref 36–46)
HEMOGLOBIN: 14 (ref 12.0–16.0)
Platelets: 176 (ref 150–399)
WBC: 9

## 2017-12-08 ENCOUNTER — Encounter: Payer: Self-pay | Admitting: Internal Medicine

## 2017-12-08 ENCOUNTER — Emergency Department (HOSPITAL_COMMUNITY)
Admission: EM | Admit: 2017-12-08 | Discharge: 2017-12-09 | Disposition: A | Discharge status: Home / Self Care | Payer: Medicare Other | Attending: Emergency Medicine | Admitting: Emergency Medicine

## 2017-12-08 ENCOUNTER — Non-Acute Institutional Stay: Payer: Medicare Other | Admitting: Internal Medicine

## 2017-12-08 ENCOUNTER — Encounter (HOSPITAL_COMMUNITY): Payer: Self-pay

## 2017-12-08 DIAGNOSIS — I110 Hypertensive heart disease with heart failure: Secondary | ICD-10-CM | POA: Insufficient documentation

## 2017-12-08 DIAGNOSIS — S6992XA Unspecified injury of left wrist, hand and finger(s), initial encounter: Secondary | ICD-10-CM | POA: Diagnosis present

## 2017-12-08 DIAGNOSIS — W19XXXA Unspecified fall, initial encounter: Secondary | ICD-10-CM

## 2017-12-08 DIAGNOSIS — Z79899 Other long term (current) drug therapy: Secondary | ICD-10-CM | POA: Diagnosis not present

## 2017-12-08 DIAGNOSIS — H44811 Hemophthalmos, right eye: Secondary | ICD-10-CM

## 2017-12-08 DIAGNOSIS — I509 Heart failure, unspecified: Secondary | ICD-10-CM | POA: Insufficient documentation

## 2017-12-08 DIAGNOSIS — S0501XA Injury of conjunctiva and corneal abrasion without foreign body, right eye, initial encounter: Secondary | ICD-10-CM

## 2017-12-08 DIAGNOSIS — J449 Chronic obstructive pulmonary disease, unspecified: Secondary | ICD-10-CM | POA: Insufficient documentation

## 2017-12-08 DIAGNOSIS — S61012A Laceration without foreign body of left thumb without damage to nail, initial encounter: Secondary | ICD-10-CM | POA: Diagnosis not present

## 2017-12-08 DIAGNOSIS — E039 Hypothyroidism, unspecified: Secondary | ICD-10-CM | POA: Insufficient documentation

## 2017-12-08 DIAGNOSIS — Y92128 Other place in nursing home as the place of occurrence of the external cause: Secondary | ICD-10-CM | POA: Insufficient documentation

## 2017-12-08 DIAGNOSIS — Y9389 Activity, other specified: Secondary | ICD-10-CM | POA: Insufficient documentation

## 2017-12-08 DIAGNOSIS — Z7982 Long term (current) use of aspirin: Secondary | ICD-10-CM | POA: Diagnosis not present

## 2017-12-08 DIAGNOSIS — Y999 Unspecified external cause status: Secondary | ICD-10-CM | POA: Insufficient documentation

## 2017-12-08 DIAGNOSIS — W01190A Fall on same level from slipping, tripping and stumbling with subsequent striking against furniture, initial encounter: Secondary | ICD-10-CM | POA: Diagnosis not present

## 2017-12-08 MED ORDER — FLUORESCEIN SODIUM 1 MG OP STRP
1.0000 | ORAL_STRIP | Freq: Once | OPHTHALMIC | Status: AC
  Administered 2017-12-08: 1 via OPHTHALMIC
  Filled 2017-12-08: qty 1

## 2017-12-08 MED ORDER — ACETAMINOPHEN 500 MG PO TABS: 1000.0000 mg | ORAL_TABLET | Freq: Four times a day (QID) | ORAL | 0 refills | Status: DC | PRN

## 2017-12-08 MED ORDER — TOBRAMYCIN 0.3 % OP SOLN: 2.0000 [drp] | OPHTHALMIC | 0 refills | Status: DC

## 2017-12-08 MED ORDER — ACETAMINOPHEN 500 MG PO TABS
1000.0000 mg | ORAL_TABLET | Freq: Once | ORAL | Status: AC
  Administered 2017-12-08: 1000 mg via ORAL
  Filled 2017-12-08: qty 2

## 2017-12-08 MED ORDER — TETRACAINE HCL 0.5 % OP SOLN
2.0000 [drp] | Freq: Once | OPHTHALMIC | Status: AC
  Administered 2017-12-08: 2 [drp] via OPHTHALMIC
  Filled 2017-12-08: qty 4

## 2017-12-08 MED ORDER — TOBRAMYCIN 0.3 % OP SOLN
2.0000 [drp] | OPHTHALMIC | Status: DC
  Administered 2017-12-09: 2 [drp] via OPHTHALMIC
  Filled 2017-12-08: qty 5

## 2017-12-08 NOTE — ED Notes (Signed)
Asked pt if she would like something to eat, but she's not hungry.

## 2017-12-08 NOTE — ED Triage Notes (Signed)
Patient arrive via [facility] from [facility] . Patient had a unwitnessed fall while walking between recliner and bathroom. Sounds like a trip and fall. Denies LOC. Landed on her right side and hit her right eye. There is some blood in the eye under her cornea. Denies pain else where. Just eye pain. A/0 X4. Patient has a box with 2 headphones she uses to communicate.

## 2017-12-08 NOTE — Discharge Instructions (Signed)
1.  Leave the dressing and Steri-Strips in place on your thumb laceration.  Try to avoid getting this area wet.  If you must to change the outer layers, try not to pull off glue and Steri-Strips.  The wound will heal and the glue and strips will fall off by themselves.  Not apply any lotions or antibiotic ointments over these as it will remove them.  2.  Use the antibiotic drops, TobraDex as prescribed.  You had some mild drainage from your uninjured eye on the left therefore the drops are recommended for both eyes.  You should be checked by ophthalmology as soon as possible for recheck of your injured eye.  3.  Use acetaminophen 1000 mg every 6 hours as needed for pain.  Try to elevate any bruised areas and apply ice packs that are well wrapped for 20 minutes every 2 hours.

## 2017-12-08 NOTE — ED Notes (Signed)
Called [phone] Spoke w Vivien Rotaashston nurse Informed nurse of eye and thumb care  ptar called  Paper work ready

## 2017-12-08 NOTE — Progress Notes (Signed)
Location:  [facility] Room Number: 673 Place of Service:  ALF [phone]) Provider:  Blanchie Serve, MD  Blanchie Serve, MD  Patient Care Team: Blanchie Serve, MD as PCP - General (Internal Medicine) [name], MD (Internal Medicine) [place], Man X, NP as Nurse Practitioner (Nurse Practitioner) [place], Man X, NP as Nurse Practitioner (Nurse Practitioner)  Extended Emergency Contact Information Primary Emergency Contact: Ken Caryl of [place] Phone: [phone] Relation: Son  Code Status:  DNR  Goals of care: Advanced Directive information Advanced Directives 12/08/2017  Does Patient Have a Medical Advance Directive? Yes  Type of Paramedic of [place];Living will;Out of facility DNR (pink MOST or yellow form)  Does patient want to make changes to medical advance directive? No - Patient declined  Copy of [place] in Chart? Yes  Would patient like information on creating a medical advance directive? -  Pre-existing out of facility DNR order (yellow form or pink MOST form) Yellow form placed in chart (order not valid for inpatient use)     Chief Complaint  Patient presents with  . Acute Visit    Fall     HPI:  Pt is a 83 y.o. female seen today for an acute visit for fall. She had an unwitnessed fall this afternoon. She was found in the bathroom sitting on her buttock with face on the side of her walker. Patient was picked up from the floor. She was able to move all 4 extremities and was able to walk with her walker to the chair. Staff noticed bleeding from her right eye. She is seen in her room. She is sitting on her recliner chair. She has blood draining from the corner of her right eye. Her right eye is closed. She is very hard of hearing. She mentions hurting in her right eye.    Past Medical History:  Diagnosis Date  . Abnormal liver  function tests    11/11/15 AST 31, ALT 88, alk phos 96   . Acute kidney failure, unspecified ([place]) 01/24/2012  . Arthralgia 09/26/2014   Multiple joints: knees, shoulders, wrists, spine hips   . Arthritis   . Candidiasis of other urogenital sites 08/10/2012  . Cerebral embolism 05/23/2005  . CHF (congestive heart failure) ([place])   . Cholelithiasis 11/04/2015  . Closed fracture of sacrum and coccyx without mention of spinal cord injury 02/14/2012  . Constipation 05/03/2013  . Contusion of face, scalp, and neck except eye(s) 06/01/2012  . Contusion of wrist 06/01/2012  . COPD (chronic obstructive pulmonary disease) ([place])   . COPD, mild ([place]) 01/20/2013  . Edema 04/20/2012  . H/O: CVA (cerebrovascular accident)   . Hearing loss 09/26/2014  . History of cancer of uterus   . HTN (hypertension), benign   . Hyperglycemia 11/14/2015  . Hypothyroidism   . Insomnia, unspecified 08/10/2012  . Open wound of knee, leg (except thigh), and ankle, without mention of complication 02/14/3298  . Other and unspecified hyperlipidemia 06/01/2012  . Other disorder of coccyx 01/27/2012  . Pain in joint, ankle and foot 06/14/2012  . Peripheral vascular disease, unspecified ([place]) 01/27/2012  . Personal history of fall 01/27/2012  . Pneumonia, organism unspecified(486) 01/23/2005  . Rheumatic fever 09/26/1933   Age 22 10/01/14 ESR 13, RAF <10   . Seasonal allergies 05/03/2013  . Stasis dermatitis of both legs 09/26/2014  . Unspecified constipation 11/02/2012  . Unspecified hearing loss 02/08/2013  .  Unspecified hereditary and idiopathic peripheral neuropathy 01/27/2012  . Urinary frequency 02/24/2014  . Ventricular fibrillation ([place]) 01/24/2012   Past Surgical History:  Procedure Laterality Date  . ABDOMINAL HYSTERECTOMY  1990   for endometrial cancer  . CATARACT EXTRACTION W/ INTRAOCULAR LENS  IMPLANT, BILATERAL    . [place]  . GUM SURGERY  1932  . MASTOIDECTOMY  1920   bilateral  . THYROIDECTOMY   1960  . TONSILLECTOMY  1916    Allergies  Allergen Reactions  . Amoxicillin     Unknown, patient unable to answer questionnaire   . Avelox [Moxifloxacin]     Unknown: listed on MAR  . Erythromycin     Unknown: listed on MAR  . Monistat [Miconazole]     Unknown: listed on MAR  . Morphine And Related     Unknown: listed on MAR  . Orange Juice [Orange Oil]     Unknown: listed on [facility]    Outpatient Encounter Medications as of 12/08/2017  Medication Sig  . acetaminophen (TYLENOL) 325 MG tablet Take 650 mg by mouth daily.   Marland Kitchen acetaminophen (TYLENOL) 325 MG tablet Take 650 mg by mouth every 4 (four) hours as needed for headache (in addition to the scheduled dose).  Marland Kitchen alum & mag hydroxide-simeth (MAALOX/MYLANTA) 200-200-20 MG/5ML suspension Take 30 mLs by mouth every 6 (six) hours as needed for indigestion or heartburn.  Marland Kitchen aspirin 81 MG chewable tablet Chew 81 mg by mouth daily.  . B Complex-C (B-COMPLEX WITH VITAMIN C) tablet Take 1 tablet by mouth daily.  . calcium citrate-vitamin D (CITRACAL+D) 315-200 MG-UNIT per tablet Take 1 tablet by mouth daily.  . clotrimazole-betamethasone (LOTRISONE) cream Apply 1 application topically 2 (two) times daily as needed.   . docusate sodium (COLACE) 100 MG capsule Take 200 mg by mouth daily.   . fexofenadine (ALLEGRA) 60 MG tablet Take 60 mg by mouth daily.  . furosemide (LASIX) 20 MG tablet Take 1 tablet by mouth daily.  Marland Kitchen ipratropium-albuterol (DUONEB) 0.5-2.5 (3) MG/3ML SOLN Take 3 mLs by nebulization 2 (two) times daily.   Marland Kitchen levothyroxine (SYNTHROID, LEVOTHROID) 150 MCG tablet Take 150 mcg by mouth daily.   . Magnesium Hydroxide (MILK OF MAGNESIA PO) Take 30 mLs by mouth daily as needed.  . Melatonin 3 MG TABS Take 1 tablet by mouth at bedtime.  . Multiple Vitamins-Minerals (EQL CENTURY MATURE) TABS Take 1 tablet by mouth daily.   . polyvinyl alcohol (LIQUIFILM TEARS) 1.4 % ophthalmic solution Place 1 drop into both eyes 2 (two) times daily.  .  potassium chloride (K-DUR,KLOR-CON) 10 MEQ tablet Take 10 mEq by mouth daily.   . [DISCONTINUED] nystatin-triamcinolone (MYCOLOG II) cream Apply 1 application topically 2 (two) times daily.   No facility-administered encounter medications on file as of 12/08/2017.     Review of Systems  HENT: Positive for hearing loss.   Eyes: Positive for pain, redness and visual disturbance.  Respiratory: Negative for shortness of breath.   Cardiovascular: Negative for chest pain.  Gastrointestinal: Negative for abdominal pain and vomiting.  Musculoskeletal: Positive for arthralgias and gait problem.       Denies neck pain   Neurological: Negative for dizziness and headaches.  Hematological: Bruises/bleeds easily.  Psychiatric/Behavioral: Negative for behavioral problems.    Immunization History  Administered Date(s) Administered  . Influenza Whole 10/19/2012  . Influenza-Unspecified 10/18/2013, 10/17/2014, 09/02/2015, 09/29/2016, 10/03/2017  . PPD Test 01/22/2013, 11/28/2015  . Pneumococcal Polysaccharide-23 01/22/2013  . Td 02/21/2009  .  Tdap 04/07/2015   Pertinent  Health Maintenance Due  Topic Date Due  . DEXA SCAN  03/25/1978  . PNA vac Low Risk Adult (2 of 2 - PCV13) 01/22/2014  . INFLUENZA VACCINE  Completed   Fall Risk  09/02/2017 11/14/2015 04/17/2015 09/26/2014 09/26/2014  Falls in the past year? No No Yes No Yes  Number falls in past yr: - - 1 - 2 or more  Injury with Fall? - - Yes - -  Comment - - laceration forehead 04/07/15 - -  Risk Factor Category  - - High Fall Risk - -  Risk for fall due to : - Impaired mobility;Impaired vision - - -   Functional Status Survey:    Vitals:   12/08/17 1436  BP: 120/76  Pulse: 87  Resp: 20  Temp: 98.3 F (36.8 C)  TempSrc: Oral  SpO2: 97%  Weight: 151 lb 12.8 oz (68.9 kg)  Height: 5' 2"  (1.575 m)   Body mass index is 27.76 kg/m.   Wt Readings from Last 3 Encounters:  12/08/17 151 lb 12.8 oz (68.9 kg)  11/16/17 152 lb (68.9  kg)  09/02/17 152 lb (68.9 kg)   Physical Exam  Constitutional: She is oriented to person, place, and time. She appears well-developed and well-nourished. No distress.  HENT:  Head: Normocephalic and atraumatic.  Mouth/Throat: Oropharynx is clear and moist. No oropharyngeal exudate.  No blood clot noted  Eyes: Pupils are equal, round, and reactive to light. Right eye exhibits discharge. Left eye exhibits no discharge. Right conjunctiva has a hemorrhage.    Conjunctival erythema +  Neck: Normal range of motion. Neck supple.  Cardiovascular: Normal rate and regular rhythm.  Pulmonary/Chest: Effort normal and breath sounds normal.  Abdominal: Soft. Bowel sounds are normal. There is no tenderness.  Musculoskeletal: She exhibits deformity.  Able to move all 4 extremities, unsteady gait, uses walker  Lymphadenopathy:    She has no cervical adenopathy.  Neurological: She is alert and oriented to person, place, and time.  Skin: Skin is warm and dry. She is not diaphoretic.    Labs reviewed: Recent Labs    04/19/17  NA 139  K 4.1  BUN 30*  CREATININE 0.9   Recent Labs    04/19/17  AST 16  ALT 14  ALKPHOS 55   Recent Labs    04/19/17  WBC 6.4  HGB 13.4  HCT 39  PLT 170   Lab Results  Component Value Date   TSH 0.52 04/19/2017   No results found for: HGBA1C No results found for: CHOL, HDL, LDLCALC, LDLDIRECT, TRIG, CHOLHDL  Significant Diagnostic Results in last 30 days:  No results found.  Assessment/Plan  Fall initial encounter Trauma to eye with bleeding. No visible laceration on eyelid from outside. Able to move all 4 extremities. neurocheck stable. See below. No visible bruise elsewhere.   Right eye bleeding Post fall. Bleeding into right eye. Has pain and photophobia. No visible laceration to eyelid from outside. Concern from trauma from handle of walker during fall.  Will send pt to ED for evaluation to assess for traumatic hyphema, lid laceration and  conjunctival hemorrhage. Will need her vision checked as well. Patient will need to be seen by ophthalmology. On baby aspirin.   Family/ staff Communication: reviewed care plan with patient and charge nurse.    Labs/tests ordered: sending patient to ED.   Blanchie Serve, MD Internal Medicine [place],  [place] [address] Cell Phone (Monday-Friday 8 am - 5 pm): [phone] On Call: [phone] and follow prompts after 5 pm and on weekends Office Phone: [phone] Office Fax: [phone]

## 2017-12-08 NOTE — ED Provider Notes (Signed)
[facility] DEPT Provider Note   CSN: [id number] Arrival date & time: 12/08/17  1529     History   Chief Complaint Chief Complaint  Patient presents with  . Fall    HPI [name] is a 83 y.o. female.  HPI [facility] from [facility].  She had a fall while walking between recliner and bathroom.  Patient has denied loss of consciousness.  She reports pain in the right eye.  She also has a laceration to the left thumb. Past Medical History:  Diagnosis Date  . Abnormal liver function tests    11/11/15 AST 31, ALT 88, alk phos 96   . Acute kidney failure, unspecified ([place]) 01/24/2012  . Arthralgia 09/26/2014   Multiple joints: knees, shoulders, wrists, spine hips   . Arthritis   . Candidiasis of other urogenital sites 08/10/2012  . Cerebral embolism 05/23/2005  . CHF (congestive heart failure) ([place])   . Cholelithiasis 11/04/2015  . Closed fracture of sacrum and coccyx without mention of spinal cord injury 02/14/2012  . Constipation 05/03/2013  . Contusion of face, scalp, and neck except eye(s) 06/01/2012  . Contusion of wrist 06/01/2012  . COPD (chronic obstructive pulmonary disease) ([place])   . COPD, mild ([place]) 01/20/2013  . Edema 04/20/2012  . H/O: CVA (cerebrovascular accident)   . Hearing loss 09/26/2014  . History of cancer of uterus   . HTN (hypertension), benign   . Hyperglycemia 11/14/2015  . Hypothyroidism   . Insomnia, unspecified 08/10/2012  . Open wound of knee, leg (except thigh), and ankle, without mention of complication 07/19/7543  . Other and unspecified hyperlipidemia 06/01/2012  . Other disorder of coccyx 01/27/2012  . Pain in joint, ankle and foot 06/14/2012  . Peripheral vascular disease, unspecified ([place]) 01/27/2012  . Personal history of fall 01/27/2012  . Pneumonia, organism unspecified(486) 01/23/2005  . Rheumatic fever 09/26/1933   Age 76 10/01/14 ESR 13, RAF <10   . Seasonal allergies 05/03/2013  . Stasis  dermatitis of both legs 09/26/2014  . Unspecified constipation 11/02/2012  . Unspecified hearing loss 02/08/2013  . Unspecified hereditary and idiopathic peripheral neuropathy 01/27/2012  . Urinary frequency 02/24/2014  . Ventricular fibrillation ([place]) 01/24/2012    Patient Active Problem List   Diagnosis Date Noted  . Candidiasis, skin or nails 08/12/2017  . Pressure ulcer, stage 2 06/02/2016  . Compression fracture of lumbar vertebra ([place]) 11/14/2015  . Hyperglycemia 11/14/2015  . Cholelithiasis 11/04/2015  . Abnormal liver function tests   . Gait instability 09/26/2014  . Arthralgia 09/26/2014  . Stasis dermatitis of both legs 09/26/2014  . Hearing loss 09/26/2014  . Urinary frequency 02/24/2014  . Edema 08/09/2013  . Corn of toe 08/09/2013  . Constipation 05/03/2013  . Seasonal allergies 05/03/2013  . Neuropathic pain of both legs 05/03/2013  . Cramping of hands 05/03/2013  . COPD, mild ([place]) 01/20/2013  . HTN (hypertension) 01/20/2013  . Hypothyroidism 01/20/2013  . Rheumatic fever 09/26/1933    Past Surgical History:  Procedure Laterality Date  . ABDOMINAL HYSTERECTOMY  1990   for endometrial cancer  . CATARACT EXTRACTION W/ INTRAOCULAR LENS  IMPLANT, BILATERAL    . [place]  . GUM SURGERY  1932  . MASTOIDECTOMY  1920   bilateral  . THYROIDECTOMY  1960  . TONSILLECTOMY  1916    OB History    No data available       Home Medications    Prior to Admission  medications   Medication Sig Start Date End Date Taking? Authorizing Provider  acetaminophen (TYLENOL) 325 MG tablet Take 650 mg by mouth at bedtime.    Yes [provider]  acetaminophen (TYLENOL) 325 MG tablet Take 650 mg by mouth every 4 (four) hours as needed for headache (in addition to the scheduled dose).   Yes [provider]  aspirin 81 MG chewable tablet Chew 81 mg by mouth daily.   Yes [provider]  B Complex-C (B-COMPLEX WITH VITAMIN C) tablet  Take 1 tablet by mouth daily.   Yes [provider]  calcium citrate-vitamin D (CITRACAL+D) 315-200 MG-UNIT per tablet Take 1 tablet by mouth daily.   Yes [provider]  docusate sodium (COLACE) 100 MG capsule Take 200 mg by mouth daily.    Yes [provider]  fexofenadine (ALLEGRA) 60 MG tablet Take 60 mg by mouth daily.   Yes [provider]  furosemide (LASIX) 20 MG tablet Take 1 tablet by mouth daily. 10/10/15  Yes [provider]  ipratropium-albuterol (DUONEB) 0.5-2.5 (3) MG/3ML SOLN Take 3 mLs by nebulization 2 (two) times daily.    Yes [provider]  levothyroxine (SYNTHROID, LEVOTHROID) 150 MCG tablet Take 150 mcg by mouth daily.  10/14/15  Yes [provider]  Melatonin 3 MG TABS Take 1 tablet by mouth at bedtime.   Yes [provider]  Multiple Vitamins-Minerals (EQL CENTURY MATURE) TABS Take 1 tablet by mouth daily.    Yes [provider]  polyvinyl alcohol (LIQUIFILM TEARS) 1.4 % ophthalmic solution Place 1 drop into both eyes 2 (two) times daily.   Yes [provider]  potassium chloride (K-DUR,KLOR-CON) 10 MEQ tablet Take 10 mEq by mouth daily.  10/11/15  Yes [provider]  acetaminophen (TYLENOL) 500 MG tablet Take 2 tablets (1,000 mg total) by mouth every 6 (six) hours as needed. 12/08/17   Charlesetta Shanks, MD  alum & mag hydroxide-simeth (MAALOX/MYLANTA) 200-200-20 MG/5ML suspension Take 30 mLs by mouth every 6 (six) hours as needed for indigestion or heartburn.    [provider]  clotrimazole-betamethasone (LOTRISONE) cream Apply 1 application topically 2 (two) times daily as needed (skin irritation).     [provider]  Magnesium Hydroxide (MILK OF MAGNESIA PO) Take 30 mLs by mouth daily as needed (constipation).     [provider]  tobramycin (TOBREX) 0.3 % ophthalmic solution Place 2 drops into both eyes every 4 (four) hours. Apply 2 drops to the  eyes every 4 hours for 3 days, begin to taper by 1 dose per day until finished. 12/08/17   Charlesetta Shanks, MD    Family History Family History  Problem Relation Age of Onset  . Stroke Mother   . Heart disease Father     Social History Social History   Tobacco Use  . Smoking status: Never Smoker  . Smokeless tobacco: Never Used  Substance Use Topics  . Alcohol use: No  . Drug use: No     Allergies   Amoxicillin; Avelox [moxifloxacin]; Erythromycin; Monistat [miconazole]; Morphine and related; and Orange juice [orange oil]   Review of Systems Review of Systems 10 Systems reviewed and are negative for acute change except as noted in the HPI.   Physical Exam Updated Vital Signs BP (!) 152/63   Pulse 81   Temp 97.9 F (36.6 C) (Oral)   Resp 18   SpO2 97%   Physical Exam  Constitutional:  Patient is alert and interactive  and calm.  No respiratory distress.  HENT:  Patient does not have contusions abrasions or hematomas to the head.  She does have swelling of the right eyelids that is mild in conjunction with some sub-conjunctival hematoma and tearing on the right.  Otherwise nose is normal no bleeding or deformity.  No evidence of intraoral injury.  Eyes:  Right eye shows some mild swelling of the lids.  With examination there is an extremely small laceration to the lateral canthus of only a few millimeters.  Patient has some conjunctival hemorrhage and inflammation in the medial aspect of the eye and above the cornea.  Extraocular motions are intact.  Pupil is round and symmetric.  With fluorescein staining no local uptake over the cornea.  No streaming.  Neck: Neck supple.  No C-spine tenderness to palpation.  Cardiovascular: Normal rate, regular rhythm and intact distal pulses.  Pulmonary/Chest: Effort normal and breath sounds normal. She exhibits no tenderness.  I have compressed on the anterior chest and compressed laterally no response of pain from the patient.    Abdominal: Soft. She exhibits no distension. There is no tenderness. There is no guarding.  Musculoskeletal:  I have put the patient to range of motion of both upper extremities without pain.  Patient had mild discomfort over the right wrist but no deformity and once she was up and ambulated with a walker, she did not exhibit any weakness or difficulty using the hand or the wrist.  She also was able to use it spontaneously to move herself in the stretcher.  I have put both lower extremities through range of motion without apparent hip or knee pain.  Patient does have minor contusion to both knees but no lacerations or abrasions.  Patient stood and ambulated with a steady and strong gait with a walker. Patient has a minor laceration to the left thumb less than 1 cm without any active bleeding or deformity of the digit.  Neurological: She is alert.  Patient is alert and interactive she is very hard of hearing but communication through both her hearing device and written messages has been effective.  She does not show any signs of confusion.  She expresses herself appropriately without slurred speech. use of all extremities is coordinated.  Skin: Skin is warm and dry.  Psychiatric: She has a normal mood and affect.     ED Treatments / Results  Labs (all labs ordered are listed, but only abnormal results are displayed) Labs Reviewed - No data to display  EKG  EKG Interpretation None       Radiology No results found.  Procedures .Marland KitchenLaceration Repair Date/Time: 12/08/2017 7:36 PM Performed by: Charlesetta Shanks, MD Authorized by: Charlesetta Shanks, MD   Anesthesia (see MAR for exact dosages):    Anesthesia method:  None Laceration details:    Location:  Finger   Finger location:  L thumb   Length (cm):  1   Depth (mm):  3 Treatment:    Area cleansed with:  Saline   Amount of cleaning:  Standard Skin repair:    Repair method:  Tissue adhesive and Steri-Strips   Number of  Steri-Strips:  2 Approximation:    Approximation:  Close   [place] border: well-aligned   Post-procedure details:    Patient tolerance of procedure:  Tolerated well, no immediate complications   (including critical care time)  Medications Ordered in ED Medications  tobramycin (TOBREX) 0.3 % ophthalmic solution 2 drop (not administered)  tetracaine (PONTOCAINE) 0.5 %  ophthalmic solution 2 drop (2 drops Right Eye Given by Other 12/08/17 1843)  fluorescein ophthalmic strip 1 strip (1 strip Right Eye Given by Other 12/08/17 1843)  acetaminophen (TYLENOL) tablet 1,000 mg (1,000 mg Oral Given 12/08/17 1710)     Initial Impression / Assessment and Plan / ED Course  I have reviewed the triage vital signs and the nursing notes.  Pertinent labs & imaging results that were available during my care of the patient were reviewed by me and considered in my medical decision making (see chart for details).     Final Clinical Impressions(s) / ED Diagnoses   Final diagnoses:  Fall, initial encounter  Laceration of left thumb without foreign body without damage to nail, initial encounter  Abrasion of right conjunctiva, initial encounter   Patient is 89 and in excellent baseline physical condition for age.  She had unwitnessed fall at the nursing home.  Identified injuries were managed as outlined above.  Patient has clear mental status without evidence of intracranial injury.  Patient's ambulatory with her walker with steady and coordinated gait and no signs of pain limitation.  At this time, conservative management as outlined using acetaminophen for pain control ice for contusions and follow-up with ophthalmology and PCP. ED Discharge Orders        Ordered    tobramycin (TOBREX) 0.3 % ophthalmic solution  Every 4 hours     12/08/17 1923    acetaminophen (TYLENOL) 500 MG tablet  Every 6 hours PRN     12/08/17 1923       Charlesetta Shanks, MD 12/08/17 1939

## 2017-12-26 ENCOUNTER — Encounter (HOSPITAL_COMMUNITY): Payer: Self-pay | Admitting: Emergency Medicine

## 2017-12-26 ENCOUNTER — Emergency Department (HOSPITAL_COMMUNITY)
Admission: EM | Admit: 2017-12-26 | Discharge: 2017-12-26 | Disposition: A | Discharge status: Home / Self Care | Payer: Medicare Other | Attending: Emergency Medicine | Admitting: Emergency Medicine

## 2017-12-26 ENCOUNTER — Emergency Department (HOSPITAL_COMMUNITY): Payer: Medicare Other

## 2017-12-26 DIAGNOSIS — E039 Hypothyroidism, unspecified: Secondary | ICD-10-CM | POA: Diagnosis not present

## 2017-12-26 DIAGNOSIS — R4182 Altered mental status, unspecified: Secondary | ICD-10-CM | POA: Insufficient documentation

## 2017-12-26 DIAGNOSIS — I110 Hypertensive heart disease with heart failure: Secondary | ICD-10-CM | POA: Insufficient documentation

## 2017-12-26 DIAGNOSIS — I509 Heart failure, unspecified: Secondary | ICD-10-CM | POA: Diagnosis not present

## 2017-12-26 DIAGNOSIS — J449 Chronic obstructive pulmonary disease, unspecified: Secondary | ICD-10-CM | POA: Diagnosis not present

## 2017-12-26 DIAGNOSIS — Z79899 Other long term (current) drug therapy: Secondary | ICD-10-CM | POA: Diagnosis not present

## 2017-12-26 DIAGNOSIS — R4189 Other symptoms and signs involving cognitive functions and awareness: Secondary | ICD-10-CM

## 2017-12-26 LAB — CBC
HEMATOCRIT: 46.7 % — AB (ref 36.0–46.0)
Hemoglobin: 14.9 g/dL (ref 12.0–15.0)
MCH: 29.8 pg (ref 26.0–34.0)
MCHC: 31.9 g/dL (ref 30.0–36.0)
MCV: 93.4 fL (ref 78.0–100.0)
PLATELETS: 181 10*3/uL (ref 150–400)
RBC: 5 MIL/uL (ref 3.87–5.11)
RDW: 14.2 % (ref 11.5–15.5)
WBC: 8.2 10*3/uL (ref 4.0–10.5)

## 2017-12-26 LAB — PROTIME-INR
INR: 1.02
PROTHROMBIN TIME: 13.3 s (ref 11.4–15.2)

## 2017-12-26 LAB — I-STAT CHEM 8, ED
BUN: 34 mg/dL — ABNORMAL HIGH (ref 6–20)
Calcium, Ion: 1.16 mmol/L (ref 1.15–1.40)
Chloride: 101 mmol/L (ref 101–111)
Creatinine, Ser: 1 mg/dL (ref 0.44–1.00)
GLUCOSE: 103 mg/dL — AB (ref 65–99)
HCT: 46 % (ref 36.0–46.0)
Hemoglobin: 15.6 g/dL — ABNORMAL HIGH (ref 12.0–15.0)
Potassium: 4.5 mmol/L (ref 3.5–5.1)
SODIUM: 141 mmol/L (ref 135–145)
TCO2: 31 mmol/L (ref 22–32)

## 2017-12-26 LAB — URINALYSIS, ROUTINE W REFLEX MICROSCOPIC
BACTERIA UA: NONE SEEN
Bilirubin Urine: NEGATIVE
Glucose, UA: NEGATIVE mg/dL
HGB URINE DIPSTICK: NEGATIVE
Ketones, ur: NEGATIVE mg/dL
Nitrite: NEGATIVE
PROTEIN: NEGATIVE mg/dL
SPECIFIC GRAVITY, URINE: 1.02 (ref 1.005–1.030)
pH: 6 (ref 5.0–8.0)

## 2017-12-26 LAB — DIFFERENTIAL
Basophils Absolute: 0 10*3/uL (ref 0.0–0.1)
Basophils Relative: 1 %
EOS ABS: 0.1 10*3/uL (ref 0.0–0.7)
EOS PCT: 1 %
LYMPHS PCT: 37 %
Lymphs Abs: 3 10*3/uL (ref 0.7–4.0)
MONOS PCT: 7 %
Monocytes Absolute: 0.5 10*3/uL (ref 0.1–1.0)
Neutro Abs: 4.5 10*3/uL (ref 1.7–7.7)
Neutrophils Relative %: 54 %

## 2017-12-26 LAB — COMPREHENSIVE METABOLIC PANEL
ALT: 22 U/L (ref 14–54)
AST: 25 U/L (ref 15–41)
Albumin: 3.4 g/dL — ABNORMAL LOW (ref 3.5–5.0)
Alkaline Phosphatase: 59 U/L (ref 38–126)
Anion gap: 9 (ref 5–15)
BILIRUBIN TOTAL: 0.7 mg/dL (ref 0.3–1.2)
BUN: 30 mg/dL — AB (ref 6–20)
CHLORIDE: 104 mmol/L (ref 101–111)
CO2: 27 mmol/L (ref 22–32)
CREATININE: 1 mg/dL (ref 0.44–1.00)
Calcium: 9.1 mg/dL (ref 8.9–10.3)
GFR, EST AFRICAN AMERICAN: 50 mL/min — AB (ref >60)
GFR, EST NON AFRICAN AMERICAN: 43 mL/min — AB (ref >60)
Glucose, Bld: 108 mg/dL — ABNORMAL HIGH (ref 65–99)
POTASSIUM: 4.8 mmol/L (ref 3.5–5.1)
Sodium: 140 mmol/L (ref 135–145)
TOTAL PROTEIN: 6 g/dL — AB (ref 6.5–8.1)

## 2017-12-26 LAB — RAPID URINE DRUG SCREEN, HOSP PERFORMED
AMPHETAMINES: NOT DETECTED
BENZODIAZEPINES: NOT DETECTED
Barbiturates: NOT DETECTED
Cocaine: NOT DETECTED
OPIATES: NOT DETECTED
TETRAHYDROCANNABINOL: NOT DETECTED

## 2017-12-26 LAB — TSH: TSH: 1.378 u[IU]/mL (ref 0.350–4.500)

## 2017-12-26 LAB — TROPONIN I: TROPONIN I: 0.04 ng/mL — AB (ref <0.03)

## 2017-12-26 LAB — I-STAT TROPONIN, ED: Troponin i, poc: 0.04 ng/mL (ref 0.00–0.08)

## 2017-12-26 LAB — APTT: aPTT: 28 seconds (ref 24–36)

## 2017-12-26 LAB — ETHANOL

## 2017-12-26 NOTE — ED Triage Notes (Signed)
Pt BIB [facility] for Altered mental status. [facility]. Per staff patient was not responding appropriately. Normally talks and answers appropriately. During Triage patient A+Ox3 (baseline). Last known well time unknown

## 2017-12-26 NOTE — ED Notes (Signed)
Pt able to eat applesauce without difficulty.

## 2017-12-26 NOTE — ED Notes (Signed)
Patient transported to CT 

## 2017-12-26 NOTE — ED Provider Notes (Signed)
[facility] EMERGENCY DEPARTMENT Provider Note   CSN: [id number] Arrival date & time: 12/26/17  0430     History   Chief Complaint Chief Complaint  Patient presents with  . Altered Mental Status    HPI [name] is a 84 y.o. female.  Level 5 caveat for hearing impairment.  Patient does not know why she was in the hospital.  She states "they thought I was dead".  According to EMS, the patient was not responding appropriately to the staff.  She was awake and alert on EMS arrival.  Last seen normal was 11 PM during previous shift.  Phone call placed to friend's home [place] and discussed with Engineer, water.  The nurse states that when they went in to check the patient she was not responding to voice as she usually does and "would not wake up".  She would not open her eyes or follow commands but she was breathing and had a pulse.  She woke up after EMS arrived.  The patient does not have any complaints.   The history is provided by the patient. The history is limited by the condition of the patient.  Altered Mental Status   Pertinent negatives include no weakness.    Past Medical History:  Diagnosis Date  . Abnormal liver function tests    11/11/15 AST 31, ALT 88, alk phos 96   . Acute kidney failure, unspecified ([place]) 01/24/2012  . Arthralgia 09/26/2014   Multiple joints: knees, shoulders, wrists, spine hips   . Arthritis   . Candidiasis of other urogenital sites 08/10/2012  . Cerebral embolism 05/23/2005  . CHF (congestive heart failure) ([place])   . Cholelithiasis 11/04/2015  . Closed fracture of sacrum and coccyx without mention of spinal cord injury 02/14/2012  . Constipation 05/03/2013  . Contusion of face, scalp, and neck except eye(s) 06/01/2012  . Contusion of wrist 06/01/2012  . COPD (chronic obstructive pulmonary disease) ([place])   . COPD, mild ([place]) 01/20/2013  . Edema 04/20/2012  . H/O: CVA (cerebrovascular accident)   . Hearing loss 09/26/2014  . History  of cancer of uterus   . HTN (hypertension), benign   . Hyperglycemia 11/14/2015  . Hypothyroidism   . Insomnia, unspecified 08/10/2012  . Open wound of knee, leg (except thigh), and ankle, without mention of complication 01/14/9797  . Other and unspecified hyperlipidemia 06/01/2012  . Other disorder of coccyx 01/27/2012  . Pain in joint, ankle and foot 06/14/2012  . Peripheral vascular disease, unspecified ([place]) 01/27/2012  . Personal history of fall 01/27/2012  . Pneumonia, organism unspecified([place]) 01/23/2005  . Rheumatic fever 09/26/1933   Age 22 10/01/14 ESR 13, RAF <10   . Seasonal allergies 05/03/2013  . Stasis dermatitis of both legs 09/26/2014  . Unspecified constipation 11/02/2012  . Unspecified hearing loss 02/08/2013  . Unspecified hereditary and idiopathic peripheral neuropathy 01/27/2012  . Urinary frequency 02/24/2014  . Ventricular fibrillation ([place]) 01/24/2012    Patient Active Problem List   Diagnosis Date Noted  . Candidiasis, skin or nails 08/12/2017  . Pressure ulcer, stage 2 06/02/2016  . Compression fracture of lumbar vertebra ([place]) 11/14/2015  . Hyperglycemia 11/14/2015  . Cholelithiasis 11/04/2015  . Abnormal liver function tests   . Gait instability 09/26/2014  . Arthralgia 09/26/2014  . Stasis dermatitis of both legs 09/26/2014  . Hearing loss 09/26/2014  . Urinary frequency 02/24/2014  . Edema 08/09/2013  . Corn of toe 08/09/2013  . Constipation 05/03/2013  . Seasonal allergies  05/03/2013  . Neuropathic pain of both legs 05/03/2013  . Cramping of hands 05/03/2013  . COPD, mild ([place]) 01/20/2013  . HTN (hypertension) 01/20/2013  . Hypothyroidism 01/20/2013  . Rheumatic fever 09/26/1933    Past Surgical History:  Procedure Laterality Date  . ABDOMINAL HYSTERECTOMY  1990   for endometrial cancer  . CATARACT EXTRACTION W/ INTRAOCULAR LENS  IMPLANT, BILATERAL    . [place]  . GUM SURGERY  1932  . MASTOIDECTOMY  1920   bilateral  .  THYROIDECTOMY  1960  . TONSILLECTOMY  1916    OB History    No data available       Home Medications    Prior to Admission medications   Medication Sig Start Date End Date Taking? Authorizing Provider  acetaminophen (TYLENOL) 325 MG tablet Take 650 mg by mouth at bedtime.     [provider]  acetaminophen (TYLENOL) 325 MG tablet Take 650 mg by mouth every 4 (four) hours as needed for headache (in addition to the scheduled dose).    [provider]  acetaminophen (TYLENOL) 500 MG tablet Take 2 tablets (1,000 mg total) by mouth every 6 (six) hours as needed. 12/08/17   Charlesetta Shanks, MD  alum & mag hydroxide-simeth (MAALOX/MYLANTA) 200-200-20 MG/5ML suspension Take 30 mLs by mouth every 6 (six) hours as needed for indigestion or heartburn.    [provider]  aspirin 81 MG chewable tablet Chew 81 mg by mouth daily.    [provider]  B Complex-C (B-COMPLEX WITH VITAMIN C) tablet Take 1 tablet by mouth daily.    [provider]  calcium citrate-vitamin D (CITRACAL+D) 315-200 MG-UNIT per tablet Take 1 tablet by mouth daily.    [provider]  clotrimazole-betamethasone (LOTRISONE) cream Apply 1 application topically 2 (two) times daily as needed (skin irritation).     [provider]  docusate sodium (COLACE) 100 MG capsule Take 200 mg by mouth daily.     [provider]  fexofenadine (ALLEGRA) 60 MG tablet Take 60 mg by mouth daily.    [provider]  furosemide (LASIX) 20 MG tablet Take 1 tablet by mouth daily. 10/10/15   [provider]  ipratropium-albuterol (DUONEB) 0.5-2.5 (3) MG/3ML SOLN Take 3 mLs by nebulization 2 (two) times daily.     [provider]  levothyroxine (SYNTHROID, LEVOTHROID) 150 MCG tablet Take 150 mcg by mouth daily.  10/14/15   [provider]  Magnesium Hydroxide (MILK OF MAGNESIA PO) Take 30 mLs by mouth daily as needed (constipation).     [provider]  Melatonin 3 MG TABS Take 1 tablet by mouth at bedtime.    [provider]  Multiple Vitamins-Minerals (EQL CENTURY MATURE) TABS Take 1 tablet by mouth daily.     [provider]  polyvinyl alcohol (LIQUIFILM TEARS) 1.4 % ophthalmic solution Place 1 drop into both eyes 2 (two) times daily.    [provider]  potassium chloride (K-DUR,KLOR-CON) 10 MEQ tablet Take 10 mEq by mouth daily.  10/11/15   [provider]  tobramycin (TOBREX) 0.3 % ophthalmic solution Place 2 drops into both eyes every 4 (four) hours. Apply 2 drops to the eyes every 4 hours for 3 days, begin to taper by 1 dose per day until finished. 12/08/17   Charlesetta Shanks, MD    Family History Family History  Problem Relation Age of Onset  . Stroke Mother   . Heart disease Father  Social History Social History   Tobacco Use  . Smoking status: Never Smoker  . Smokeless tobacco: Never Used  Substance Use Topics  . Alcohol use: No  . Drug use: No     Allergies   Amoxicillin; Avelox [moxifloxacin]; Erythromycin; Monistat [miconazole]; Morphine and related; and Orange juice [orange oil]   Review of Systems Review of Systems  Constitutional: Positive for activity change. Negative for appetite change.  HENT: Negative for congestion.   Respiratory: Negative for cough, chest tightness and shortness of breath.   Cardiovascular: Negative for chest pain.  Gastrointestinal: Negative for abdominal pain, nausea and vomiting.  Genitourinary: Negative for dysuria, hematuria, vaginal bleeding and vaginal discharge.  Musculoskeletal: Negative for arthralgias and myalgias.  Skin: Negative for rash.  Neurological: Negative for dizziness, tremors, weakness and headaches.   all other systems are negative except as noted in the HPI and PMH.     Physical Exam Updated Vital Signs BP (!) 159/64   Pulse 73   Temp 97.6 F (36.4 C)   Ht _0  (1.575 m)   Wt 68.5 kg (151 lb)    SpO2 97%   BMI 27.62 kg/m   Physical Exam  Constitutional: She is oriented to person, place, and time. She appears well-developed and well-nourished. No distress.  Hard of hearing  HENT:  Head: Normocephalic and atraumatic.  Mouth/Throat: Oropharynx is clear and moist. No oropharyngeal exudate.  Eyes: Conjunctivae and EOM are normal. Pupils are equal, round, and reactive to light.  Neck: Normal range of motion. Neck supple.  Paraspinal C spine tenderness. No midline tenderness  Cardiovascular: Normal rate, regular rhythm, normal heart sounds and intact distal pulses.  No murmur heard. Pulmonary/Chest: Effort normal and breath sounds normal. No respiratory distress. She exhibits no tenderness.  Abdominal: Soft. There is no tenderness. There is no rebound and no guarding.  Musculoskeletal: Normal range of motion. She exhibits no edema or tenderness.  Neurological: She is alert and oriented to person, place, and time. No cranial nerve deficit. She exhibits normal muscle tone. Coordination normal.   Patient oriented to person and place.  Not situation or time.  5 out of 5 strength throughout, no facial droop, cranial nerves II to XII intact  Skin: Skin is warm. Capillary refill takes less than 2 seconds.  Psychiatric: She has a normal mood and affect. Her behavior is normal.  Nursing note and vitals reviewed.    ED Treatments / Results  Labs (all labs ordered are listed, but only abnormal results are displayed) Labs Reviewed  COMPREHENSIVE METABOLIC PANEL - Abnormal; Notable for the following components:      Result Value   Glucose, Bld 108 (*)    BUN 30 (*)    Total Protein 6.0 (*)    Albumin 3.4 (*)    GFR calc non Af Amer 43 (*)    GFR calc Af Amer 50 (*)    All other components within normal limits  CBC - Abnormal; Notable for the following components:   HCT 46.7 (*)    All other components within normal limits  URINALYSIS, ROUTINE W REFLEX MICROSCOPIC - Abnormal; Notable  for the following components:   Leukocytes, UA TRACE (*)    Squamous Epithelial / LPF 0-5 (*)    All other components within normal limits  TROPONIN I - Abnormal; Notable for the following components:   Troponin I 0.04 (*)    All other components within normal limits  I-STAT CHEM 8, ED - Abnormal; Notable  for the following components:   BUN 34 (*)    Glucose, Bld 103 (*)    Hemoglobin 15.6 (*)    All other components within normal limits  TSH  ETHANOL  PROTIME-INR  APTT  DIFFERENTIAL  RAPID URINE DRUG SCREEN, HOSP PERFORMED  I-STAT TROPONIN, ED    EKG  EKG Interpretation  Date/Time:  Monday December 26 2017 05:02:03 EST Ventricular Rate:  74 PR Interval:    QRS Duration: 104 QT Interval:  401 QTC Calculation: 445 R Axis:   40 Text Interpretation:  Sinus rhythm Borderline low voltage, extremity leads No significant change was found Confirmed by Ezequiel Essex [phone]) on 12/26/2017 5:09:39 AM       Radiology Ct Head Wo Contrast  Result Date: 12/26/2017 CLINICAL DATA:  Unwitnessed fall. LEFT altered level consciousness and neck pain. History of hypertension, stroke and uterine cancer. EXAM: CT HEAD WITHOUT CONTRAST CT CERVICAL SPINE WITHOUT CONTRAST TECHNIQUE: Multidetector CT imaging of the head and cervical spine was performed following the standard protocol without intravenous contrast. Multiplanar CT image reconstructions of the cervical spine were also generated. COMPARISON:  CT HEAD June 19, 2012 and CT cervical spine January 27, 2007 FINDINGS: CT HEAD FINDINGS BRAIN: No intraparenchymal hemorrhage, mass effect nor midline shift. The ventricles and sulci are normal for age. Patchy supratentorial white matter hypodensities within normal range for patient's age, though non-specific are most compatible with chronic small vessel ischemic disease. Old LEFT basal ganglia lacunar infarct. No acute large vascular territory infarcts. No abnormal extra-axial fluid collections. Basal  cisterns are patent. VASCULAR: Moderate calcific atherosclerosis of the carotid siphons. SKULL: No skull fracture. No significant scalp soft tissue swelling. SINUSES/ORBITS: Trace paranasal sinus mucosal thickening. Status post bilateral partial wall up mastoidectomy with soft tissue within the surgical bed. Bilateral middle ear effusions. The included ocular globes and orbital contents are non-suspicious. Status post bilateral ocular lens implants. OTHER: None. CT CERVICAL SPINE FINDINGS ALIGNMENT: Maintained lordosis. Vertebral bodies in alignment. SKULL BASE AND VERTEBRAE: Cervical vertebral bodies and posterior elements are intact. Moderate C3-4 through C6-7 disc height loss with endplate sclerosis and marginal spurring compatible with degenerative disc with mineralization. RIGHT C3-4 facets fused on degenerative basis. No destructive bony lesions. C1-2 articulation maintained with moderate arthropathy. SOFT TISSUES AND SPINAL CANAL: Nonacute. 10 mm ovoid probable lymph node LEFT parotid gland. Mild to moderate calcific atherosclerosis of the carotid bifurcations. Status post thyroidectomy. DISC LEVELS: No significant osseous canal stenosis. Severe bilateral C4-5, LEFT C5-6 moderate to severe C6-7 neural foraminal narrowing. UPPER CHEST: Lung apices are clear. OTHER: None. IMPRESSION: CT HEAD: 1. No acute intracranial process. 2. Stable examination including old LEFT basal ganglia lacunar infarct and moderate chronic small vessel ischemic disease. CT CERVICAL SPINE: 1. No acute fracture or malalignment. Electronically Signed   By: Elon Alas M.D.   On: 12/26/2017 05:46   Ct Cervical Spine Wo Contrast  Result Date: 12/26/2017 CLINICAL DATA:  Unwitnessed fall. LEFT altered level consciousness and neck pain. History of hypertension, stroke and uterine cancer. EXAM: CT HEAD WITHOUT CONTRAST CT CERVICAL SPINE WITHOUT CONTRAST TECHNIQUE: Multidetector CT imaging of the head and cervical spine was performed  following the standard protocol without intravenous contrast. Multiplanar CT image reconstructions of the cervical spine were also generated. COMPARISON:  CT HEAD June 19, 2012 and CT cervical spine January 27, 2007 FINDINGS: CT HEAD FINDINGS BRAIN: No intraparenchymal hemorrhage, mass effect nor midline shift. The ventricles and sulci are normal for age. Patchy supratentorial white matter hypodensities  within normal range for patient's age, though non-specific are most compatible with chronic small vessel ischemic disease. Old LEFT basal ganglia lacunar infarct. No acute large vascular territory infarcts. No abnormal extra-axial fluid collections. Basal cisterns are patent. VASCULAR: Moderate calcific atherosclerosis of the carotid siphons. SKULL: No skull fracture. No significant scalp soft tissue swelling. SINUSES/ORBITS: Trace paranasal sinus mucosal thickening. Status post bilateral partial wall up mastoidectomy with soft tissue within the surgical bed. Bilateral middle ear effusions. The included ocular globes and orbital contents are non-suspicious. Status post bilateral ocular lens implants. OTHER: None. CT CERVICAL SPINE FINDINGS ALIGNMENT: Maintained lordosis. Vertebral bodies in alignment. SKULL BASE AND VERTEBRAE: Cervical vertebral bodies and posterior elements are intact. Moderate C3-4 through C6-7 disc height loss with endplate sclerosis and marginal spurring compatible with degenerative disc with mineralization. RIGHT C3-4 facets fused on degenerative basis. No destructive bony lesions. C1-2 articulation maintained with moderate arthropathy. SOFT TISSUES AND SPINAL CANAL: Nonacute. 10 mm ovoid probable lymph node LEFT parotid gland. Mild to moderate calcific atherosclerosis of the carotid bifurcations. Status post thyroidectomy. DISC LEVELS: No significant osseous canal stenosis. Severe bilateral C4-5, LEFT C5-6 moderate to severe C6-7 neural foraminal narrowing. UPPER CHEST: Lung apices are clear.  OTHER: None. IMPRESSION: CT HEAD: 1. No acute intracranial process. 2. Stable examination including old LEFT basal ganglia lacunar infarct and moderate chronic small vessel ischemic disease. CT CERVICAL SPINE: 1. No acute fracture or malalignment. Electronically Signed   By: Elon Alas M.D.   On: 12/26/2017 05:46    Procedures Procedures (including critical care time)  Medications Ordered in ED Medications - No data to display   Initial Impression / Assessment and Plan / ED Course  I have reviewed the triage vital signs and the nursing notes.  Pertinent labs & imaging results that were available during my care of the patient were reviewed by me and considered in my medical decision making (see chart for details).    Patient from living facility with episode of difficult to wake up.  Now back to baseline.  No documented seizure activity.  She is hard of hearing at baseline  AMS workup performed.  CT head stable. CBG normal. UA negative. Labs reassuring. Minimal troponin elevation. EKG nonischemic. No CP or SOB.  Workup discussed with patient's granddaughter Xandria Gallaga at bedside.  She is her power of attorney as well as a Designer, jewellery.  Anderson Malta states that patient has had episodes of this (decreased responsiveness) in the past where she is difficult to arouse but episodes never lasted this long.  She is never had these episodes formally evaluated.   Unclear etiology of patient's episode of decreased responsiveness.  Discussed reassuring workup at this time.  Anderson Malta feels she is back to baseline.  Discussed possibility of TIA.  Given patient's age, Anderson Malta feels like inpatient workup would not add much to her long-term outcome. She agrees that the patient would not be a candidate for anticoagulation. She would prefer patient to return home. seems to be reasonable given patient's advanced age and return to baseline. The patient has no complaints and wishes to leave the  hospital.  BP (!) 142/57   Pulse 70   Temp (!) 97.5 F (36.4 C) (Oral)   Resp 17   Ht _0  (1.575 m)   Wt 68.5 kg (151 lb)   SpO2 97%   BMI 27.62 kg/m    Final Clinical Impressions(s) / ED Diagnoses   Final diagnoses:  Decreased responsiveness    ED  Discharge Orders    None       Ezequiel Essex, MD 12/26/17 2246

## 2017-12-26 NOTE — Discharge Instructions (Signed)
Follow-up with your doctor regarding these episodes.  Admission to the hospital was offered but you prefer to go home.  Return to the ED if you wish to be reevaluated.

## 2018-02-21 ENCOUNTER — Non-Acute Institutional Stay: Payer: Medicare Other | Admitting: Internal Medicine

## 2018-02-21 ENCOUNTER — Encounter: Payer: Self-pay | Admitting: Internal Medicine

## 2018-02-21 DIAGNOSIS — L03115 Cellulitis of right lower limb: Secondary | ICD-10-CM | POA: Diagnosis not present

## 2018-02-21 DIAGNOSIS — I10 Essential (primary) hypertension: Secondary | ICD-10-CM

## 2018-02-21 DIAGNOSIS — K5901 Slow transit constipation: Secondary | ICD-10-CM | POA: Diagnosis not present

## 2018-02-21 DIAGNOSIS — H9193 Unspecified hearing loss, bilateral: Secondary | ICD-10-CM

## 2018-02-21 DIAGNOSIS — I739 Peripheral vascular disease, unspecified: Secondary | ICD-10-CM | POA: Diagnosis not present

## 2018-02-21 DIAGNOSIS — J449 Chronic obstructive pulmonary disease, unspecified: Secondary | ICD-10-CM | POA: Diagnosis not present

## 2018-02-21 DIAGNOSIS — J302 Other seasonal allergic rhinitis: Secondary | ICD-10-CM | POA: Diagnosis not present

## 2018-02-21 DIAGNOSIS — E039 Hypothyroidism, unspecified: Secondary | ICD-10-CM

## 2018-02-21 NOTE — Progress Notes (Signed)
Location:  [place] Room Number: 735 Place of Service:  ALF [phone]) Provider:  Blanchie Serve MD  Blanchie Serve, MD  Patient Care Team: Blanchie Serve, MD as PCP - General (Internal Medicine) [name], MD (Internal Medicine) [place] Mast, Man X, NP as Nurse Practitioner (Nurse Practitioner) [place] Mast, Man X, NP as Nurse Practitioner (Nurse Practitioner)  Extended Emergency Contact Information Primary Emergency Contact: [name] States of [place] Phone: [phone] Relation: Son  Code Status:  DNR  Goals of care: Advanced Directive information Advanced Directives 02/21/2018  Does Patient Have a Medical Advance Directive? Yes  Type of Paramedic of [place];Living will;Out of facility DNR (pink MOST or yellow form)  Does patient want to make changes to medical advance directive? No - Patient declined  Copy of [place] in Chart? Yes  Would patient like information on creating a medical advance directive? -  Pre-existing out of facility DNR order (yellow form or pink MOST form) Yellow form placed in chart (order not valid for inpatient use)     Chief Complaint  Patient presents with  . Medical Management of Chronic Issues    Routine Visit     HPI:  Pt is a 84 y.o. female seen today for medical management of chronic diseases. She ambulates with wheelchair and self propels. She had a fall on 02/06/18. Uses a walker for short distance ambulation. she has some redness to her buttock area and gets zinc oxide. She had a fall in 12/08/17 with ED visit. Limited HPI and ROS with her hearing loss and memory loss. She complaints of pain to right leg. Per nursing, this is a new complaint.   Allergic rhinitis- currently on fexofenadine 60 mg daily and duoneb bid  Chronic constipation-  Taking colace 200 mg daily and milk of magnesia as needed.  Hypothyroidism-  currently on levothyroxine 150 mcg daily.  Mild COPD- no recent exacebration. Currently on duoneb bid  Hypertension- currently on lasix with kcl. Also on aspirin. Reviewed home BP readings 120-144/70-80  PVD- takes lasix and kcl.    Past Medical History:  Diagnosis Date  . Abnormal liver function tests    11/11/15 AST 31, ALT 88, alk phos 96   . Acute kidney failure, unspecified ([place]) 01/24/2012  . Arthralgia 09/26/2014   Multiple joints: knees, shoulders, wrists, spine hips   . Arthritis   . Candidiasis of other urogenital sites 08/10/2012  . Cerebral embolism 05/23/2005  . CHF (congestive heart failure) ([place])   . Cholelithiasis 11/04/2015  . Closed fracture of sacrum and coccyx without mention of spinal cord injury 02/14/2012  . Constipation 05/03/2013  . Contusion of face, scalp, and neck except eye(s) 06/01/2012  . Contusion of wrist 06/01/2012  . COPD (chronic obstructive pulmonary disease) ([place])   . COPD, mild ([place]) 01/20/2013  . Edema 04/20/2012  . H/O: CVA (cerebrovascular accident)   . Hearing loss 09/26/2014  . History of cancer of uterus   . HTN (hypertension), benign   . Hyperglycemia 11/14/2015  . Hypothyroidism   . Insomnia, unspecified 08/10/2012  . Open wound of knee, leg (except thigh), and ankle, without mention of complication 08/18/2228  . Other and unspecified hyperlipidemia 06/01/2012  . Other disorder of coccyx 01/27/2012  . Pain in joint, ankle and foot 06/14/2012  . Peripheral vascular disease, unspecified ([place]) 01/27/2012  . Personal history of fall 01/27/2012  . Pneumonia, organism unspecified(486) 01/23/2005  .  Rheumatic fever 09/26/1933   Age 89 10/01/14 ESR 13, RAF <10   . Seasonal allergies 05/03/2013  . Stasis dermatitis of both legs 09/26/2014  . Unspecified constipation 11/02/2012  . Unspecified hearing loss 02/08/2013  . Unspecified hereditary and idiopathic peripheral neuropathy 01/27/2012  . Urinary frequency 02/24/2014  . Ventricular fibrillation ([place])  01/24/2012   Past Surgical History:  Procedure Laterality Date  . ABDOMINAL HYSTERECTOMY  1990   for endometrial cancer  . CATARACT EXTRACTION W/ INTRAOCULAR LENS  IMPLANT, BILATERAL    . [place]  . GUM SURGERY  1932  . MASTOIDECTOMY  1920   bilateral  . THYROIDECTOMY  1960  . TONSILLECTOMY  1916    Allergies  Allergen Reactions  . Amoxicillin     Unknown, patient unable to answer questionnaire   . Aspirin Other (See Comments)    On MAR  . Avelox [Moxifloxacin]     Unknown: listed on MAR  . Erythromycin     Unknown: listed on MAR  . Monistat [Miconazole]     Unknown: listed on MAR  . Morphine And Related     Unknown: listed on MAR  . Orange Juice [Orange Oil]     Unknown: listed on [facility]    Outpatient Encounter Medications as of 02/21/2018  Medication Sig  . acetaminophen (TYLENOL) 325 MG tablet Take 650 mg by mouth at bedtime.   Marland Kitchen acetaminophen (TYLENOL) 500 MG tablet Take 2 tablets (1,000 mg total) by mouth every 6 (six) hours as needed.  Marland Kitchen alum & mag hydroxide-simeth (MAALOX/MYLANTA) 200-200-20 MG/5ML suspension Take 30 mLs by mouth every 6 (six) hours as needed for indigestion or heartburn.  Marland Kitchen aspirin 81 MG chewable tablet Chew 81 mg by mouth daily.  . B Complex-C (B-COMPLEX WITH VITAMIN C) tablet Take 1 tablet by mouth daily.  . calcium citrate-vitamin D (CITRACAL+D) 315-200 MG-UNIT per tablet Take 1 tablet by mouth daily.  . clotrimazole-betamethasone (LOTRISONE) cream Apply 1 application topically 2 (two) times daily as needed (skin irritation).   . Dextran 70-Hypromellose (ARTIFICIAL TEARS) 0.1-0.3 % SOLN Place 1 drop into both eyes 2 (two) times daily.  Marland Kitchen docusate sodium (COLACE) 100 MG capsule Take 200 mg by mouth daily.   . fexofenadine (ALLEGRA) 60 MG tablet Take 60 mg by mouth daily.  . furosemide (LASIX) 20 MG tablet Take 1 tablet by mouth daily.  Marland Kitchen ipratropium-albuterol (DUONEB) 0.5-2.5 (3) MG/3ML SOLN Take 3 mLs by nebulization 2 (two)  times daily.   Marland Kitchen levothyroxine (SYNTHROID, LEVOTHROID) 150 MCG tablet Take 150 mcg by mouth daily.   . magnesium hydroxide (MILK OF MAGNESIA) 400 MG/5ML suspension Take 30 mLs by mouth daily as needed for mild constipation.  . Melatonin 3 MG TABS Take 1 tablet by mouth at bedtime.  . Multiple Vitamin (MULTIVITAMIN WITH MINERALS) TABS tablet Take 1 tablet by mouth daily.  . potassium chloride (K-DUR,KLOR-CON) 10 MEQ tablet Take 10 mEq by mouth daily.   Marland Kitchen tobramycin (TOBREX) 0.3 % ophthalmic solution Place 1 drop into both eyes daily. Until the medication is finished.  . [DISCONTINUED] tobramycin (TOBREX) 0.3 % ophthalmic solution Place 2 drops into both eyes every 4 (four) hours. Apply 2 drops to the eyes every 4 hours for 3 days, begin to taper by 1 dose per day until finished. (Patient taking differently: Place 2 drops into both eyes daily. until finished.)   No facility-administered encounter medications on file as of 02/21/2018.     Review of Systems  Constitutional: Negative for appetite change and fever.  HENT: Positive for hearing loss. Negative for mouth sores and sore throat.   Respiratory: Negative for cough and shortness of breath.   Cardiovascular: Positive for leg swelling. Negative for chest pain.  Gastrointestinal: Positive for constipation. Negative for abdominal pain, nausea and vomiting.  Musculoskeletal: Positive for gait problem.  Neurological: Negative for dizziness and headaches.  Psychiatric/Behavioral: Positive for confusion.    Immunization History  Administered Date(s) Administered  . Influenza Whole 10/19/2012  . Influenza-Unspecified 10/18/2013, 10/17/2014, 09/02/2015, 09/29/2016, 10/03/2017  . PPD Test 01/22/2013, 11/28/2015  . Pneumococcal Polysaccharide-23 01/22/2013  . Td 02/21/2009  . Tdap 04/07/2015   Pertinent  Health Maintenance Due  Topic Date Due  . DEXA SCAN  03/25/1978  . PNA vac Low Risk Adult (2 of 2 - PCV13) 01/22/2014  . INFLUENZA VACCINE   Completed   Fall Risk  09/02/2017 11/14/2015 04/17/2015 09/26/2014 09/26/2014  Falls in the past year? No No Yes No Yes  Number falls in past yr: - - 1 - 2 or more  Injury with Fall? - - Yes - -  Comment - - laceration forehead 04/07/15 - -  Risk Factor Category  - - High Fall Risk - -  Risk for fall due to : - Impaired mobility;Impaired vision - - -   Functional Status Survey:    Vitals:   02/21/18 1136  BP: 124/70  Pulse: 74  Resp: 16  Temp: 98 F (36.7 C)  TempSrc: Oral  SpO2: 98%  Weight: 152 lb 3.2 oz (69 kg)  Height: 5' 2"  (1.575 m)   Body mass index is 27.84 kg/m.   Wt Readings from Last 3 Encounters:  02/21/18 152 lb 3.2 oz (69 kg)  12/26/17 151 lb (68.5 kg)  12/08/17 151 lb 12.8 oz (68.9 kg)   Physical Exam  Constitutional:  Overweight, elderly female in no acute distress   HENT:  Head: Normocephalic and atraumatic.  Nose: Nose normal.  Mouth/Throat: Oropharynx is clear and moist.  Eyes: Conjunctivae and EOM are normal. Pupils are equal, round, and reactive to light. Left eye exhibits no discharge.  Neck: Normal range of motion. Neck supple.  Cardiovascular: Normal rate and regular rhythm.  Pulmonary/Chest: Effort normal and breath sounds normal. No respiratory distress. She has no wheezes. She has no rales.  Abdominal: Soft. Bowel sounds are normal. There is no tenderness. There is no rebound.  Musculoskeletal: She exhibits edema and tenderness.  1+ edema to both leg, 2+ edema to right foot and 1+ to left foot. Right leg has erythema to anterior side with warmth and tenderness to touch. Few linear marking noted like scratch marks. No drainage. Weakly Palpable dorsalis pedis  Lymphadenopathy:    She has no cervical adenopathy.  Neurological: She is alert.  Oriented to person and place only  Skin: Skin is warm and dry. There is erythema.  Psychiatric: She has a normal mood and affect.    Labs reviewed: Recent Labs    11/17/17 12/26/17 0449 12/26/17 0509    NA 139 140 141  K 4.5 4.8 4.5  CL  --  104 101  CO2  --  27  --   GLUCOSE  --  108* 103*  BUN 40* 30* 34*  CREATININE 1.1 1.00 1.00  CALCIUM  --  9.1  --    Recent Labs    04/19/17 11/17/17 12/26/17 0449  AST 16 22 25   ALT 14 17 22   ALKPHOS 55 51 59  BILITOT  --   --  0.7  PROT  --   --  6.0*  ALBUMIN  --   --  3.4*   Recent Labs    04/19/17 11/17/17 12/26/17 0449 12/26/17 0509  WBC 6.4 9.0 8.2  --   NEUTROABS  --   --  4.5  --   HGB 13.4 14.0 14.9 15.6*  HCT 39 41 46.7* 46.0  MCV  --   --  93.4  --   PLT 170 176 181  --    Lab Results  Component Value Date   TSH 1.378 12/26/2017   No results found for: HGBA1C No results found for: CHOL, HDL, LDLCALC, LDLDIRECT, TRIG, CHOLHDL  Significant Diagnostic Results in last 30 days:  No results found.  Assessment/Plan  1. COPD, mild ([place]) Continue duoneb for now, monitor for signs of exacerbations  2. Hypothyroidism, unspecified type Lab Results  Component Value Date   TSH 1.378 12/26/2017   Continue levothyroxine  3. Slow transit constipation Continue colace, maintain hydration  4. Seasonal allergies Continue allegra for now  5. Essential hypertension Stable BP. Continue lasix.   6. Cellulitis of right leg New erythema with warmth, swelling and tenderness to right leg. Afebrile. Start doxycycline 100 mg q12h x 1 week with florastor 250 mg bid. Continue lasix. Check cbc with diff and bmp 02/28/18. Tylenol 650 mg bid for 1 week for pain, if no improvement reassess.   7. PVD (peripheral vascular disease) (HCC) Keep legs elevated at rest. Change lasix to 20 mg bid x 3 days, then 20 mg daily. kcl 10 meq bid x 3 days, then daily. BMP 3/19  8. Bilateral hearing loss, unspecified hearing loss type Supportive care    Family/ staff Communication: reviewed care plan with patient and charge nurse.    Labs/tests ordered:  Cbc with diff, bmp   Blanchie Serve, MD Internal Medicine [facility] [address] [place], [place] [address] Cell Phone (Monday-Friday 8 am - 5 pm): [phone] On Call: [phone] and follow prompts after 5 pm and on weekends Office Phone: [phone] Office Fax: [phone]

## 2018-02-28 LAB — BASIC METABOLIC PANEL
BUN: 45 — AB (ref 4–21)
CREATININE: 1 (ref <=1.1)
GLUCOSE: 105
POTASSIUM: 4.8 (ref 3.4–5.3)
Sodium: 142 (ref 137–147)

## 2018-02-28 LAB — CBC AND DIFFERENTIAL
HCT: 40 (ref 36–46)
Hemoglobin: 13.6 (ref 12.0–16.0)
Platelets: 186 (ref 150–399)
WBC: 7.6

## 2018-03-01 ENCOUNTER — Other Ambulatory Visit: Payer: Self-pay | Admitting: *Deleted

## 2018-05-26 ENCOUNTER — Encounter: Payer: Self-pay | Admitting: Nurse Practitioner

## 2018-05-26 ENCOUNTER — Non-Acute Institutional Stay: Payer: Medicare Other | Admitting: Nurse Practitioner

## 2018-05-26 DIAGNOSIS — R609 Edema, unspecified: Secondary | ICD-10-CM

## 2018-05-26 DIAGNOSIS — R5381 Other malaise: Secondary | ICD-10-CM | POA: Insufficient documentation

## 2018-05-26 DIAGNOSIS — R5383 Other fatigue: Secondary | ICD-10-CM

## 2018-05-26 DIAGNOSIS — L89302 Pressure ulcer of unspecified buttock, stage 2: Secondary | ICD-10-CM

## 2018-05-26 NOTE — Assessment & Plan Note (Signed)
non blanchable redness with several small superficial open areas R+L buttocks. Moist, heat, sedentary, limited mobility, sleeps in recliner are all risk factors contributing to pressure injuries. Will apply barrier oint/cream daily then cover with foam dressing until healed. Encourage the patient with frequent repositioning to reduce pressure on bone prominences.

## 2018-05-26 NOTE — Progress Notes (Signed)
Location:  [place] Room Number: 270 Place of Service:  ALF [phone]) Provider: Nikitha Mode, ManXie  NP  Blanchie Serve, MD  Patient Care Team: Blanchie Serve, MD as PCP - General (Internal Medicine) [name], MD (Internal Medicine) [place] Benz V[place] X, NP as Nurse Practitioner (Nurse Practitioner) [place] Evin [place] X, NP as Nurse Practitioner (Nurse Practitioner)  Extended Emergency Contact Information Primary Emergency Contact: [name] States of [place] Phone: [phone] Relation: Son  Code Status:  DNR Goals of care: Advanced Directive information Advanced Directives 05/26/2018  Does Patient Have a Medical Advance Directive? Yes  Type of Paramedic of [place];Living will;Out of facility DNR (pink MOST or yellow form)  Does patient want to make changes to medical advance directive? No - Patient declined  Copy of [place] in Chart? Yes  Would patient like information on creating a medical advance directive? -  Pre-existing out of facility DNR order (yellow form or pink MOST form) Yellow form placed in chart (order not valid for inpatient use)     Chief Complaint  Patient presents with  . Acute Visit    Open area on buttock, (L) heel fluid filled blister    HPI:  Pt is a 84 y.o. female seen today for an acute visit for non blanchable redness with several small superficial open areas R+L buttocks. Moist, heat, sedentary, limited mobility, sleeps in recliner are all risk factors contributing to pressure injuries. Chronic dependent edema, mostly in BLE, stable on Furosemide 48m daily.    Past Medical History:  Diagnosis Date  . Abnormal liver function tests    11/11/15 AST 31, ALT 88, alk phos 96   . Acute kidney failure, unspecified ([place] 01/24/2012  . Arthralgia 09/26/2014   Multiple joints: knees, shoulders, wrists, spine hips   . Arthritis   .  Candidiasis of other urogenital sites 08/10/2012  . Cerebral embolism 05/23/2005  . CHF (congestive heart failure) ([place]   . Cholelithiasis 11/04/2015  . Closed fracture of sacrum and coccyx without mention of spinal cord injury 02/14/2012  . Constipation 05/03/2013  . Contusion of face, scalp, and neck except eye(s) 06/01/2012  . Contusion of wrist 06/01/2012  . COPD (chronic obstructive pulmonary disease) ([place]   . COPD, mild ([facility] [facility] 01/20/2013  . Edema 04/20/2012  . H/O: CVA (cerebrovascular accident)   . Hearing loss 09/26/2014  . History of cancer of uterus   . HTN (hypertension), benign   . Hyperglycemia 11/14/2015  . Hypothyroidism   . Insomnia, unspecified 08/10/2012  . Open wound of knee, leg (except thigh), and ankle, without mention of complication 50/06/1218 . Other and unspecified hyperlipidemia 06/01/2012  . Other disorder of coccyx 01/27/2012  . Pain in joint, ankle and foot 06/14/2012  . Peripheral vascular disease, unspecified ([facility] 01/27/2012  . Personal history of fall 01/27/2012  . Pneumonia, organism unspecified(486) 01/23/2005  . Rheumatic fever 09/26/1933   Age 6 10/01/14 ESR 13, RAF <10   . Seasonal allergies 05/03/2013  . Stasis dermatitis of both legs 09/26/2014  . Unspecified constipation 11/02/2012  . Unspecified hearing loss 02/08/2013  . Unspecified hereditary and idiopathic peripheral neuropathy 01/27/2012  . Urinary frequency 02/24/2014  . Ventricular fibrillation ([place] 01/24/2012   Past Surgical History:  Procedure Laterality Date  . ABDOMINAL HYSTERECTOMY  1990   for endometrial cancer  . CATARACT EXTRACTION W/ INTRAOCULAR LENS  IMPLANT, BILATERAL    . ECTOPIC PREGNANCY SURGERY  [place]  . MASTOIDECTOMY  1920   bilateral  . THYROIDECTOMY  1960  . TONSILLECTOMY  1916    Allergies  Allergen Reactions  . Amoxicillin     Unknown, patient unable to answer questionnaire   . Aspirin Other (See Comments)    On MAR  . Avelox [Moxifloxacin]      Unknown: listed on MAR  . Erythromycin     Unknown: listed on MAR  . Monistat [Miconazole]     Unknown: listed on MAR  . Morphine And Related     Unknown: listed on MAR  . Orange Juice [Orange Oil]     Unknown: listed on [facility]    Outpatient Encounter Medications as of 05/26/2018  Medication Sig  . acetaminophen (TYLENOL) 325 MG tablet Take 650 mg by mouth at bedtime.   Marland Kitchen acetaminophen (TYLENOL) 500 MG tablet Take 2 tablets (1,000 mg total) by mouth every 6 (six) hours as needed.  Marland Kitchen alum & mag hydroxide-simeth (MAALOX/MYLANTA) 200-200-20 MG/5ML suspension Take 30 mLs by mouth every 6 (six) hours as needed for indigestion or heartburn.  Marland Kitchen aspirin 81 MG chewable tablet Chew 81 mg by mouth daily.  . B Complex-C (B-COMPLEX WITH VITAMIN C) tablet Take 1 tablet by mouth daily.  . calcium citrate-vitamin D (CITRACAL+D) 315-200 MG-UNIT per tablet Take 1 tablet by mouth daily.  . carboxymethylcellulose (REFRESH TEARS) 0.5 % SOLN Place 1 drop into both eyes 2 (two) times daily.  . clotrimazole-betamethasone (LOTRISONE) cream Apply 1 application topically 2 (two) times daily as needed (skin irritation).   Marland Kitchen docusate sodium (COLACE) 100 MG capsule Take 200 mg by mouth daily.   . fexofenadine (ALLEGRA) 60 MG tablet Take 60 mg by mouth daily.  . furosemide (LASIX) 20 MG tablet Take 1 tablet by mouth daily.  Marland Kitchen ipratropium-albuterol (DUONEB) 0.5-2.5 (3) MG/3ML SOLN Take 3 mLs by nebulization 2 (two) times daily.   Marland Kitchen levothyroxine (SYNTHROID, LEVOTHROID) 150 MCG tablet Take 150 mcg by mouth daily.   . magnesium hydroxide (MILK OF MAGNESIA) 400 MG/5ML suspension Take 30 mLs by mouth daily as needed for mild constipation.  . Melatonin 3 MG TABS Take 1 tablet by mouth at bedtime.  . Multiple Vitamin (MULTIVITAMIN WITH MINERALS) TABS tablet Take 1 tablet by mouth daily.  . potassium chloride (K-DUR,KLOR-CON) 10 MEQ tablet Take 10 mEq by mouth daily.   Marland Kitchen tobramycin (TOBREX) 0.3 % ophthalmic solution Place 1 drop  into both eyes daily. Until the medication is finished.  . [DISCONTINUED] Dextran 70-Hypromellose (ARTIFICIAL TEARS) 0.1-0.3 % SOLN Place 1 drop into both eyes 2 (two) times daily.   No facility-administered encounter medications on file as of 05/26/2018.     Review of Systems  Constitutional: Positive for fatigue. Negative for activity change, appetite change, chills, diaphoresis and fever.  HENT: Positive for hearing loss. Negative for congestion, trouble swallowing and voice change.   Respiratory: Negative for cough, shortness of breath and wheezing.   Cardiovascular: Positive for leg swelling. Negative for chest pain and palpitations.  Gastrointestinal: Negative for abdominal distention, abdominal pain, constipation, diarrhea, nausea and vomiting.  Genitourinary: Negative for difficulty urinating, dysuria and urgency.  Musculoskeletal: Positive for arthralgias and gait problem.  Skin: Positive for wound.       Buttocks, left leg.   Neurological: Negative for dizziness, speech difficulty, weakness and headaches.  Psychiatric/Behavioral: Negative for agitation, behavioral problems and sleep disturbance. The patient is not nervous/anxious.     Immunization History  Administered Date(s) Administered  . Influenza Whole 10/19/2012  . Influenza-Unspecified 10/18/2013, 10/17/2014, 09/02/2015, 09/29/2016, 10/03/2017  . PPD Test 01/22/2013, 11/28/2015  . Pneumococcal Polysaccharide-23 01/22/2013  . Td 02/21/2009  . Tdap 04/07/2015   Pertinent  Health Maintenance Due  Topic Date Due  . DEXA SCAN  03/25/1978  . PNA vac Low Risk Adult (2 of 2 - PCV13) 01/22/2014  . INFLUENZA VACCINE  07/13/2018   Fall Risk  09/02/2017 11/14/2015 04/17/2015 09/26/2014 09/26/2014  Falls in the past year? No No Yes No Yes  Number falls in past yr: - - 1 - 2 or more  Injury with Fall? - - Yes - -  Comment - - laceration forehead 04/07/15 - -  Risk Factor Category  - - High Fall Risk - -  Risk for fall due to :  - Impaired mobility;Impaired vision - - -   Functional Status Survey:    Vitals:   05/26/18 1117  BP: (!) 144/80  Pulse: 76  Resp: 20  Temp: (!) 96.7 F (35.9 C)  Weight: 157 lb 3.2 oz (71.3 kg)  Height: 5' 2" (1.575 m)   Body mass index is 28.75 kg/m. Physical Exam  Constitutional: She appears well-developed and well-nourished.  HENT:  Head: Normocephalic and atraumatic.  Eyes: Pupils are equal, round, and reactive to light. EOM are normal.  Neck: Normal range of motion. Neck supple. No JVD present. No thyromegaly present.  Cardiovascular: Normal rate and regular rhythm.  No murmur heard. Pulmonary/Chest: Effort normal. She has no wheezes. She has no rales.  Abdominal: Soft. Bowel sounds are normal. She exhibits no distension. There is no tenderness. There is no rebound.  Musculoskeletal: She exhibits edema.  1+ edema BLE  Lymphadenopathy:    She has no cervical adenopathy.  Neurological: She is alert. No cranial nerve deficit. She exhibits normal muscle tone. Coordination normal.  Oriented to person and place.   Skin: Skin is warm and dry.  Upper 1/3 of the R+L buttocks non blanchable redness, kind dark red, several small superficial open areas seen, no sign of infection. Left lateral leg abrasion about a 3x3cm, no sign of infection.   Psychiatric: She has a normal mood and affect. Her behavior is normal.    Labs reviewed: Recent Labs    12/26/17 0449 12/26/17 0509 02/28/18  NA 140 141 142  K 4.8 4.5 4.8  CL 104 101  --   CO2 27  --   --   GLUCOSE 108* 103*  --   BUN 30* 34* 45*  CREATININE 1.00 1.00 1.0  CALCIUM 9.1  --   --    Recent Labs    11/17/17 12/26/17 0449  AST 22 25  ALT 17 22  ALKPHOS 51 59  BILITOT  --  0.7  PROT  --  6.0*  ALBUMIN  --  3.4*   Recent Labs    11/17/17 12/26/17 0449 12/26/17 0509 02/28/18  WBC 9.0 8.2  --  7.6  NEUTROABS  --  4.5  --   --   HGB 14.0 14.9 15.6* 13.6  HCT 41 46.7* 46.0 40  MCV  --  93.4  --   --   PLT  176 181  --  186   Lab Results  Component Value Date   TSH 1.378 12/26/2017   No results found for: HGBA1C No results found for: CHOL, HDL, LDLCALC, LDLDIRECT, TRIG, CHOLHDL  Significant Diagnostic Results in last 30 days:  No results found.  Assessment/Plan  Pressure ulcer, stage 2 non blanchable redness with several small superficial open areas R+L buttocks. Moist, heat, sedentary, limited mobility, sleeps in recliner are all risk factors contributing to pressure injuries. Will apply barrier oint/cream daily then cover with foam dressing until healed. Encourage the patient with frequent repositioning to reduce pressure on bone prominences.   Edema Chronic dependent edema, mostly in BLE, stable, continue Furosemide 60m daily.    Malaise and fatigue Reported not feeling well, poor appetite 05/24/18, some generalized aches were improved after Tylenol. Better today. Will update CBC/diff, CMP, TSH      Family/ staff Communication: plan of care reviewed with the patient and charge nurse.   Labs/tests ordered: CBC/diff, CMP, TSH  Time spend 25 minutes.

## 2018-05-26 NOTE — Assessment & Plan Note (Signed)
Reported not feeling well, poor appetite 05/24/18, some generalized aches were improved after Tylenol. Better today. Will update CBC/diff, CMP, TSH

## 2018-05-26 NOTE — Assessment & Plan Note (Signed)
Chronic dependent edema, mostly in BLE, stable, continue Furosemide 20mg  daily.

## 2018-05-30 LAB — BASIC METABOLIC PANEL
BUN: 37 — AB (ref 4–21)
CREATININE: 0.9 (ref <=1.1)
Glucose: 86
POTASSIUM: 3.8 (ref 3.4–5.3)
Sodium: 141 (ref 137–147)

## 2018-05-30 LAB — CBC AND DIFFERENTIAL
HCT: 41 (ref 36–46)
Hemoglobin: 13.7 (ref 12.0–16.0)
PLATELETS: 171 (ref 150–399)
WBC: 6.5

## 2018-05-30 LAB — HEPATIC FUNCTION PANEL
ALT: 17 (ref 7–35)
AST: 18 (ref 13–35)
Alkaline Phosphatase: 54 (ref 25–125)
Bilirubin, Total: 0.4

## 2018-05-30 LAB — TSH: TSH: 1.19 (ref <=5.90)

## 2018-05-31 ENCOUNTER — Other Ambulatory Visit: Payer: Self-pay | Admitting: *Deleted

## 2018-05-31 LAB — COMPLETE METABOLIC PANEL WITH GFR
ALBUMIN: 3.4
CHLORIDE: 106
Calcium: 8.7
Carbon Dioxide, Total: 30
Globulin: 2.1
PROTEIN: 5.5

## 2018-07-17 ENCOUNTER — Non-Acute Institutional Stay: Payer: Medicare Other | Admitting: Nurse Practitioner

## 2018-07-17 ENCOUNTER — Encounter: Payer: Self-pay | Admitting: Nurse Practitioner

## 2018-07-17 DIAGNOSIS — M2550 Pain in unspecified joint: Secondary | ICD-10-CM

## 2018-07-17 DIAGNOSIS — J449 Chronic obstructive pulmonary disease, unspecified: Secondary | ICD-10-CM

## 2018-07-17 DIAGNOSIS — J181 Lobar pneumonia, unspecified organism: Secondary | ICD-10-CM | POA: Diagnosis not present

## 2018-07-17 DIAGNOSIS — I25119 Atherosclerotic heart disease of native coronary artery with unspecified angina pectoris: Secondary | ICD-10-CM | POA: Diagnosis not present

## 2018-07-17 DIAGNOSIS — J189 Pneumonia, unspecified organism: Secondary | ICD-10-CM | POA: Insufficient documentation

## 2018-07-17 DIAGNOSIS — I2510 Atherosclerotic heart disease of native coronary artery without angina pectoris: Secondary | ICD-10-CM | POA: Insufficient documentation

## 2018-07-17 NOTE — Assessment & Plan Note (Signed)
Stable, continue DuoNeb bid.  

## 2018-07-17 NOTE — Assessment & Plan Note (Signed)
CXR reviewed right lower lung patchy opacity 07/15/18, complete 7 day course of Doxycycline 100mg  bid. Will monitor VS daily x 7 days, then per facility protocol if stable.

## 2018-07-17 NOTE — Assessment & Plan Note (Signed)
Continue daily ASA, prn NTG, observe.

## 2018-07-17 NOTE — Assessment & Plan Note (Signed)
Complaining aches/pain in knees/ankles, no apparent redness, swelling, or fever in knees or ankles. Will increase Tylenol 650mg  q8h x 1week, then resume Tylenol 650mg  qhs. Observe.

## 2018-07-17 NOTE — Progress Notes (Signed)
Location:  [facility] Room Number: 295 Place of Service:  ALF [phone]) Provider:  Shahir Karen, ManXie  NP  Blanchie Serve, MD  Patient Care Team: Blanchie Serve, MD as PCP - General (Internal Medicine) [name], MD (Internal Medicine) [place] Nilza Eaker X, NP as Nurse Practitioner (Nurse Practitioner) [facility] Nasim Garofano X, NP as Nurse Practitioner (Nurse Practitioner)  Extended Emergency Contact Information Primary Emergency Contact: [name] [place] of [place] Phone: [phone] Relation: Son  Code Status:  DNR Goals of care: Advanced Directive information Advanced Directives 07/17/2018  Does Patient Have a Medical Advance Directive? Yes  Type of Paramedic of [place];Living will;Out of facility DNR (pink MOST or yellow form)  Does patient want to make changes to medical advance directive? No - Patient declined  Copy of [place] in Chart? Yes  Would patient like information on creating a medical advance directive? -  Pre-existing out of facility DNR order (yellow form or pink MOST form) Yellow form placed in chart (order not valid for inpatient use);Pink MOST form placed in chart (order not valid for inpatient use)     Chief Complaint  Patient presents with  . Acute Visit    pneumonia    HPI:  Pt is a 84 y.o. female seen today for an acute visit for right lower lung pneumonia.   The patient complained chest pain mid to right, NTG x1, Bp dropped 80/50 which resolved after lower her head/elevated BLE. Then the patient complained hurts all over. CXR reviewed right lower lung patchy opacity 07/15/18, 7 day course of Doxycycline '100mg'$  bid started subsequently. The patient has history of COPD, CAD, taking DuoNeb bid, ASA '81mg'$  qd, prn NTG. Chronic arthritic pain is managed with Tylenol '650mg'$  qhs, she c/o pain in knees and ankles today. The patient declined hospital or IVF  during the current acute episode.    Past Medical History:  Diagnosis Date  . Abnormal liver function tests    11/11/15 AST 31, ALT 88, alk phos 96   . Acute kidney failure, unspecified ([place]) 01/24/2012  . Arthralgia 09/26/2014   Multiple joints: knees, shoulders, wrists, spine hips   . Arthritis   . Candidiasis of other urogenital sites 08/10/2012  . Cerebral embolism 05/23/2005  . CHF (congestive heart failure) ([place])   . Cholelithiasis 11/04/2015  . Closed fracture of sacrum and coccyx without mention of spinal cord injury 02/14/2012  . Constipation 05/03/2013  . Contusion of face, scalp, and neck except eye(s) 06/01/2012  . Contusion of wrist 06/01/2012  . COPD (chronic obstructive pulmonary disease) ([place])   . COPD, mild ([place]) 01/20/2013  . Edema 04/20/2012  . H/O: CVA (cerebrovascular accident)   . Hearing loss 09/26/2014  . History of cancer of uterus   . HTN (hypertension), benign   . Hyperglycemia 11/14/2015  . Hypothyroidism   . Insomnia, unspecified 08/10/2012  . Open wound of knee, leg (except thigh), and ankle, without mention of complication 08/19/2951  . Other and unspecified hyperlipidemia 06/01/2012  . Other disorder of coccyx 01/27/2012  . Pain in joint, ankle and foot 06/14/2012  . Peripheral vascular disease, unspecified ([place]) 01/27/2012  . Personal history of fall 01/27/2012  . Pneumonia, organism unspecified(486) 01/23/2005  . Rheumatic fever 09/26/1933   Age 16 10/01/14 ESR 13, RAF <10   . Seasonal allergies 05/03/2013  . Stasis dermatitis of both legs 09/26/2014  . Unspecified constipation 11/02/2012  . Unspecified hearing  loss 02/08/2013  . Unspecified hereditary and idiopathic peripheral neuropathy 01/27/2012  . Urinary frequency 02/24/2014  . Ventricular fibrillation ([place]) 01/24/2012   Past Surgical History:  Procedure Laterality Date  . ABDOMINAL HYSTERECTOMY  1990   for endometrial cancer  . CATARACT EXTRACTION W/ INTRAOCULAR LENS  IMPLANT, BILATERAL    . [place]  . GUM SURGERY  1932  . MASTOIDECTOMY  1920   bilateral  . THYROIDECTOMY  1960  . TONSILLECTOMY  1916    Allergies  Allergen Reactions  . Amoxicillin     Unknown, patient unable to answer questionnaire   . Aspirin Other (See Comments)    On MAR  . Avelox [Moxifloxacin]     Unknown: listed on MAR  . Erythromycin     Unknown: listed on MAR  . Monistat [Miconazole]     Unknown: listed on MAR  . Morphine And Related     Unknown: listed on MAR  . Orange Juice [Orange Oil]     Unknown: listed on [facility]    Outpatient Encounter Medications as of 07/17/2018  Medication Sig  . acetaminophen (TYLENOL) 325 MG tablet Take 650 mg by mouth at bedtime.   Marland Kitchen acetaminophen (TYLENOL) 500 MG tablet Take 2 tablets (1,000 mg total) by mouth every 6 (six) hours as needed.  Marland Kitchen alum & mag hydroxide-simeth (MAALOX/MYLANTA) 200-200-20 MG/5ML suspension Take 30 mLs by mouth every 6 (six) hours as needed for indigestion or heartburn.  Marland Kitchen aspirin 81 MG chewable tablet Chew 81 mg by mouth daily.  . B Complex-C (B-COMPLEX WITH VITAMIN C) tablet Take 1 tablet by mouth daily.  . calcium citrate-vitamin D (CITRACAL+D) 315-200 MG-UNIT per tablet Take 1 tablet by mouth daily.  . carboxymethylcellulose (REFRESH TEARS) 0.5 % SOLN Place 1 drop into both eyes 2 (two) times daily.  Marland Kitchen docusate sodium (COLACE) 100 MG capsule Take 200 mg by mouth daily.   Marland Kitchen doxycycline (DORYX) 100 MG EC tablet Take 100 mg by mouth 2 (two) times daily.  . fexofenadine (ALLEGRA) 60 MG tablet Take 60 mg by mouth daily.  . furosemide (LASIX) 20 MG tablet Take 1 tablet by mouth daily.  Marland Kitchen ipratropium-albuterol (DUONEB) 0.5-2.5 (3) MG/3ML SOLN Take 3 mLs by nebulization 2 (two) times daily.   Marland Kitchen levothyroxine (SYNTHROID, LEVOTHROID) 150 MCG tablet Take 150 mcg by mouth daily.   . magnesium hydroxide (MILK OF MAGNESIA) 400 MG/5ML suspension Take 30 mLs by mouth daily as needed for mild constipation.  . Melatonin 3 MG TABS Take 1  tablet by mouth at bedtime.  . Multiple Vitamin (MULTIVITAMIN WITH MINERALS) TABS tablet Take 1 tablet by mouth daily.  . nitroGLYCERIN (NITROSTAT) 0.4 MG SL tablet Place 0.4 mg under the tongue every 5 (five) minutes as needed for chest pain.  . potassium chloride (K-DUR,KLOR-CON) 10 MEQ tablet Take 10 mEq by mouth daily.   Marland Kitchen saccharomyces boulardii (FLORASTOR) 250 MG capsule Take 250 mg by mouth 2 (two) times daily.  . clotrimazole-betamethasone (LOTRISONE) cream Apply 1 application topically 2 (two) times daily as needed (skin irritation).   . [DISCONTINUED] tobramycin (TOBREX) 0.3 % ophthalmic solution Place 1 drop into both eyes daily. Until the medication is finished.   No facility-administered encounter medications on file as of 07/17/2018.    ROS was provided with assistance of staff Review of Systems  Constitutional: Positive for activity change, appetite change and fatigue. Negative for chills, diaphoresis and fever.  HENT: Positive for hearing loss. Negative for congestion, trouble  swallowing and voice change.   Respiratory: Negative for apnea, cough, chest tightness, shortness of breath and wheezing.   Cardiovascular: Positive for chest pain and leg swelling. Negative for palpitations.  Gastrointestinal: Negative for abdominal distention, abdominal pain, constipation, diarrhea, nausea and vomiting.  Genitourinary: Negative for difficulty urinating, dysuria and urgency.  Musculoskeletal: Positive for arthralgias and gait problem. Negative for joint swelling.  Skin: Negative for color change and pallor.  Neurological: Negative for dizziness, facial asymmetry, speech difficulty and weakness.       Memory lapses.   Psychiatric/Behavioral: Negative for agitation, behavioral problems, hallucinations and sleep disturbance. The patient is not nervous/anxious.     Immunization History  Administered Date(s) Administered  . Influenza Whole 10/19/2012  . Influenza-Unspecified 10/18/2013,  10/17/2014, 09/02/2015, 09/29/2016, 10/03/2017  . PPD Test 01/22/2013, 11/28/2015  . Pneumococcal Polysaccharide-23 01/22/2013  . Td 02/21/2009  . Tdap 04/07/2015   Pertinent  Health Maintenance Due  Topic Date Due  . DEXA SCAN  03/25/1978  . PNA vac Low Risk Adult (2 of 2 - PCV13) 01/22/2014  . INFLUENZA VACCINE  07/13/2018   Fall Risk  09/02/2017 11/14/2015 04/17/2015 09/26/2014 09/26/2014  Falls in the past year? No No Yes No Yes  Number falls in past yr: - - 1 - 2 or more  Injury with Fall? - - Yes - -  Comment - - laceration forehead 04/07/15 - -  Risk Factor Category  - - High Fall Risk - -  Risk for fall due to : - Impaired mobility;Impaired vision - - -   Functional Status Survey:    Vitals:   07/17/18 1006  BP: 122/68  Pulse: 81  Resp: 18  Temp: 97.6 F (36.4 C)  SpO2: 91%  Weight: 157 lb (71.2 kg)  Height: _0  (1.575 m)   Body mass index is 28.72 kg/m. Physical Exam  Constitutional: She appears well-developed and well-nourished.  HENT:  Head: Normocephalic and atraumatic.  Eyes: Pupils are equal, round, and reactive to light. EOM are normal.  Neck: Normal range of motion. Neck supple. No JVD present. No thyromegaly present.  Cardiovascular: Normal rate and regular rhythm.  No murmur heard. Pulmonary/Chest: Effort normal. She has no wheezes. She has no rales. She exhibits no tenderness.  Decreased air entry.   Abdominal: Soft. Bowel sounds are normal. She exhibits no distension. There is no tenderness.  Musculoskeletal: She exhibits edema and tenderness.  1+ edema BLE. Needs assistance for transfer. W/c for mobility. Chronic aches/pains in knees and ankle.   Neurological: She is alert.  Oriented to person and place.   Skin: Skin is warm and dry.  Psychiatric: She has a normal mood and affect. Her behavior is normal.    Labs reviewed: Recent Labs    12/26/17 0449 12/26/17 0509 02/28/18 05/30/18  NA 140 141 142 141  K 4.8 4.5 4.8 3.8  CL 104 101  --   106  CO2 27  --   --  30  GLUCOSE 108* 103*  --   --   BUN 30* 34* 45* 37*  CREATININE 1.00 1.00 1.0 0.9  CALCIUM 9.1  --   --  8.7   Recent Labs    11/17/17 12/26/17 0449 05/30/18  AST _1 ALT _2 ALKPHOS 51 59 54  BILITOT  --  0.7  --   PROT  --  6.0*  --   ALBUMIN  --  3.4* 3.4   Recent Labs  12/26/17 0449 12/26/17 0509 02/28/18 05/30/18  WBC 8.2  --  7.6 6.5  NEUTROABS 4.5  --   --   --   HGB 14.9 15.6* 13.6 13.7  HCT 46.7* 46.0 40 41  MCV 93.4  --   --   --   PLT 181  --  186 171   Lab Results  Component Value Date   TSH 1.19 05/30/2018   No results found for: HGBA1C No results found for: CHOL, HDL, LDLCALC, LDLDIRECT, TRIG, CHOLHDL  Significant Diagnostic Results in last 30 days:  No results found.  Assessment/Plan Pneumonia involving right lung CXR reviewed right lower lung patchy opacity 07/15/18, complete 7 day course of Doxycycline 123m bid. Will monitor VS daily x 7 days, then per facility protocol if stable.   COPD, mild (HCC) Stable, continue DuoNeb bid.   CAD (coronary artery disease) Continue daily ASA, prn NTG, observe.   Arthralgia Complaining aches/pain in knees/ankles, no apparent redness, swelling, or fever in knees or ankles. Will increase Tylenol 6560mq8h x 1week, then resume Tylenol 65063mhs. Observe.      Family/ staff Communication: plan of care reviewed with the patient and charge nurse.   Labs/tests ordered:  None  Time spend 25 minutes.

## 2018-07-19 ENCOUNTER — Encounter: Payer: Self-pay | Admitting: Nurse Practitioner

## 2018-07-26 ENCOUNTER — Encounter: Payer: Self-pay | Admitting: Internal Medicine

## 2018-08-17 ENCOUNTER — Emergency Department (HOSPITAL_COMMUNITY)
Admission: EM | Admit: 2018-08-17 | Discharge: 2018-08-17 | Disposition: A | Discharge status: Home / Self Care | Payer: Medicare Other | Attending: Emergency Medicine | Admitting: Emergency Medicine

## 2018-08-17 ENCOUNTER — Encounter (HOSPITAL_COMMUNITY): Payer: Self-pay

## 2018-08-17 ENCOUNTER — Emergency Department (HOSPITAL_COMMUNITY): Payer: Medicare Other

## 2018-08-17 ENCOUNTER — Other Ambulatory Visit: Payer: Self-pay

## 2018-08-17 DIAGNOSIS — W1811XA Fall from or off toilet without subsequent striking against object, initial encounter: Secondary | ICD-10-CM | POA: Diagnosis not present

## 2018-08-17 DIAGNOSIS — Z79899 Other long term (current) drug therapy: Secondary | ICD-10-CM | POA: Insufficient documentation

## 2018-08-17 DIAGNOSIS — I2510 Atherosclerotic heart disease of native coronary artery without angina pectoris: Secondary | ICD-10-CM | POA: Insufficient documentation

## 2018-08-17 DIAGNOSIS — J449 Chronic obstructive pulmonary disease, unspecified: Secondary | ICD-10-CM | POA: Diagnosis not present

## 2018-08-17 DIAGNOSIS — Y999 Unspecified external cause status: Secondary | ICD-10-CM | POA: Diagnosis not present

## 2018-08-17 DIAGNOSIS — S0990XA Unspecified injury of head, initial encounter: Secondary | ICD-10-CM | POA: Diagnosis present

## 2018-08-17 DIAGNOSIS — Y929 Unspecified place or not applicable: Secondary | ICD-10-CM | POA: Insufficient documentation

## 2018-08-17 DIAGNOSIS — E039 Hypothyroidism, unspecified: Secondary | ICD-10-CM | POA: Diagnosis not present

## 2018-08-17 DIAGNOSIS — I509 Heart failure, unspecified: Secondary | ICD-10-CM | POA: Insufficient documentation

## 2018-08-17 DIAGNOSIS — I10 Essential (primary) hypertension: Secondary | ICD-10-CM | POA: Diagnosis not present

## 2018-08-17 DIAGNOSIS — Z87891 Personal history of nicotine dependence: Secondary | ICD-10-CM | POA: Insufficient documentation

## 2018-08-17 DIAGNOSIS — Y939 Activity, unspecified: Secondary | ICD-10-CM | POA: Diagnosis not present

## 2018-08-17 DIAGNOSIS — W19XXXA Unspecified fall, initial encounter: Secondary | ICD-10-CM

## 2018-08-17 DIAGNOSIS — S0083XA Contusion of other part of head, initial encounter: Secondary | ICD-10-CM | POA: Insufficient documentation

## 2018-08-17 LAB — COMPREHENSIVE METABOLIC PANEL
ALBUMIN: 3.4 g/dL — AB (ref 3.5–5.0)
ALT: 19 U/L (ref 0–44)
ANION GAP: 12 (ref 5–15)
AST: 26 U/L (ref 15–41)
Alkaline Phosphatase: 60 U/L (ref 38–126)
BUN: 27 mg/dL — ABNORMAL HIGH (ref 8–23)
CO2: 26 mmol/L (ref 22–32)
Calcium: 9.4 mg/dL (ref 8.9–10.3)
Chloride: 100 mmol/L (ref 98–111)
Creatinine, Ser: 0.98 mg/dL (ref 0.44–1.00)
GFR calc Af Amer: 51 mL/min — ABNORMAL LOW (ref >60)
GFR calc non Af Amer: 44 mL/min — ABNORMAL LOW (ref >60)
GLUCOSE: 112 mg/dL — AB (ref 70–99)
POTASSIUM: 4.2 mmol/L (ref 3.5–5.1)
SODIUM: 138 mmol/L (ref 135–145)
Total Bilirubin: 0.4 mg/dL (ref 0.3–1.2)
Total Protein: 6.4 g/dL — ABNORMAL LOW (ref 6.5–8.1)

## 2018-08-17 LAB — CBC WITH DIFFERENTIAL/PLATELET
Abs Immature Granulocytes: 0 10*3/uL (ref 0.0–0.1)
BASOS ABS: 0.1 10*3/uL (ref 0.0–0.1)
Basophils Relative: 1 %
EOS ABS: 0.2 10*3/uL (ref 0.0–0.7)
EOS PCT: 2 %
HCT: 46.3 % — ABNORMAL HIGH (ref 36.0–46.0)
Hemoglobin: 14.7 g/dL (ref 12.0–15.0)
Immature Granulocytes: 0 %
LYMPHS PCT: 18 %
Lymphs Abs: 1.6 10*3/uL (ref 0.7–4.0)
MCH: 29.3 pg (ref 26.0–34.0)
MCHC: 31.7 g/dL (ref 30.0–36.0)
MCV: 92.2 fL (ref 78.0–100.0)
Monocytes Absolute: 0.6 10*3/uL (ref 0.1–1.0)
Monocytes Relative: 7 %
Neutro Abs: 6.3 10*3/uL (ref 1.7–7.7)
Neutrophils Relative %: 72 %
PLATELETS: 185 10*3/uL (ref 150–400)
RBC: 5.02 MIL/uL (ref 3.87–5.11)
RDW: 13.2 % (ref 11.5–15.5)
WBC: 8.8 10*3/uL (ref 4.0–10.5)

## 2018-08-17 MED ORDER — ACETAMINOPHEN 500 MG PO TABS
500.0000 mg | ORAL_TABLET | Freq: Once | ORAL | Status: AC
  Administered 2018-08-17: 500 mg via ORAL
  Filled 2018-08-17: qty 1

## 2018-08-17 NOTE — ED Notes (Signed)
Pt returned from CT, phlebotomy at the bedside to collect labs.

## 2018-08-17 NOTE — Discharge Instructions (Addendum)
Has been evaluated after her fall.  She may take Tylenol as needed for pain.  500 mg by mouth every 6 hours as needed  CT is without any brain bleeding or any fracture.  Her CT of her neck does not show any fractures as well.  She does have a large size hematoma over her right eyebrow.  If she exhibits any of the following symptoms or complaints please return to the emergency room:   You have: A very bad (severe) headache that is not helped by medicine. Trouble walking or weakness in your arms and legs. Clear or bloody fluid coming from your nose or ears. Changes in your seeing (vision). Jerky movements that you cannot control (seizure). You throw up (vomit). Your symptoms get worse. You lose balance. Your speech is slurred. You pass out. You are sleepier and have trouble staying awake. our pain is worse or your pain is not controlled with medicine. Your skin over the hematoma breaks or starts bleeding. Your hematoma is in your chest or abdomen and you have weakness, shortness of breath, or a change in consciousness. You have a hematoma on your scalp caused by a fall or injury and you have a headache that gets worse, trouble speaking or understanding, weakness, or a change in alertness or consciousness.

## 2018-08-17 NOTE — ED Notes (Signed)
Patient Alert and oriented to baseline. Stable and ambulatory to baseline. Patient verbalized understanding of the discharge instructions.  Patient belongings were taken by the patient and family. Great grandaughter at bedside was given discharge instructions as well, refused transport and stated she could take her back to facility herself.

## 2018-08-17 NOTE — ED Provider Notes (Signed)
[facility] EMERGENCY DEPARTMENT Provider Note   CSN: [id number] Arrival date & time: 08/17/18  1438   History   Chief Complaint Chief Complaint  Patient presents with  . Fall    HPI [name] is a 84 y.o. female with history significant for thyroid, COPD, CVA, HTN presents for evaluation of head and neck pain after fall.  Patient states she was getting up off the toilet and fell forward.  States she hit her head.  Admits to frontal head pain above her right eyebrow.  Rates her pain a 5 out of 10.  Pain does not radiate.  States she has some neck pain as well. Denies chest pain, shortness of breath, fever, chills, lightheadedness, dizziness, back pain, abdominal pain, diarrhea, constipation, weakness, vision changes.  Not on anticoagulation.  She is not complaining of any extremity or lower back pain.  She is moving her extremities while speaking with her.  History is obtained from the patient  HPI  Past Medical History:  Diagnosis Date  . Abnormal liver function tests    11/11/15 AST 31, ALT 88, alk phos 96   . Acute kidney failure, unspecified (HCC) 01/24/2012  . Arthralgia 09/26/2014   Multiple joints: knees, shoulders, wrists, spine hips   . Arthritis   . Candidiasis of other urogenital sites 08/10/2012  . Cerebral embolism 05/23/2005  . CHF (congestive heart failure) (HCC)   . Cholelithiasis 11/04/2015  . Closed fracture of sacrum and coccyx without mention of spinal cord injury 02/14/2012  . Constipation 05/03/2013  . Contusion of face, scalp, and neck except eye(s) 06/01/2012  . Contusion of wrist 06/01/2012  . COPD (chronic obstructive pulmonary disease) (HCC)   . COPD, mild (HCC) 01/20/2013  . Edema 04/20/2012  . H/O: CVA (cerebrovascular accident)   . Hearing loss 09/26/2014  . History of cancer of uterus   . HTN (hypertension), benign   . Hyperglycemia 11/14/2015  . Hypothyroidism   . Insomnia, unspecified 08/10/2012  . Open wound of knee, leg  (except thigh), and ankle, without mention of complication 04/20/2012  . Other and unspecified hyperlipidemia 06/01/2012  . Other disorder of coccyx 01/27/2012  . Pain in joint, ankle and foot 06/14/2012  . Peripheral vascular disease, unspecified (HCC) 01/27/2012  . Personal history of fall 01/27/2012  . Pneumonia, organism unspecified(486) 01/23/2005  . Rheumatic fever 09/26/1933   Age 22 10/01/14 ESR 13, RAF <10   . Seasonal allergies 05/03/2013  . Stasis dermatitis of both legs 09/26/2014  . Unspecified constipation 11/02/2012  . Unspecified hearing loss 02/08/2013  . Unspecified hereditary and idiopathic peripheral neuropathy 01/27/2012  . Urinary frequency 02/24/2014  . Ventricular fibrillation (HCC) 01/24/2012    Patient Active Problem List   Diagnosis Date Noted  . Pneumonia involving right lung 07/17/2018  . CAD (coronary artery disease) 07/17/2018  . Malaise and fatigue 05/26/2018  . PVD (peripheral vascular disease) (HCC) 02/21/2018  . Pressure ulcer, stage 2 06/02/2016  . Compression fracture of lumbar vertebra (HCC) 11/14/2015  . Hyperglycemia 11/14/2015  . Cholelithiasis 11/04/2015  . Abnormal liver function tests   . Gait instability 09/26/2014  . Arthralgia 09/26/2014  . Stasis dermatitis of both legs 09/26/2014  . Hearing loss 09/26/2014  . Urinary frequency 02/24/2014  . Edema 08/09/2013  . Corn of toe 08/09/2013  . Constipation 05/03/2013  . Seasonal allergies 05/03/2013  . Neuropathic pain of both legs 05/03/2013  . Cramping of hands 05/03/2013  . COPD, mild (HCC) 01/20/2013  .   HTN (hypertension) 01/20/2013  . Hypothyroidism 01/20/2013  . Rheumatic fever 09/26/1933    Past Surgical History:  Procedure Laterality Date  . ABDOMINAL HYSTERECTOMY  1990   for endometrial cancer  . CATARACT EXTRACTION W/ INTRAOCULAR LENS  IMPLANT, BILATERAL    . ECTOPIC PREGNANCY SURGERY  1952  . GUM SURGERY  1932  . MASTOIDECTOMY  1920   bilateral  . THYROIDECTOMY  1960  .  TONSILLECTOMY  1916     OB History   None      Home Medications    Prior to Admission medications   Medication Sig Start Date End Date Taking? Authorizing Provider  acetaminophen (TYLENOL) 325 MG tablet Take 650 mg by mouth at bedtime.    Yes [provider]  acetaminophen (TYLENOL) 500 MG tablet Take 2 tablets (1,000 mg total) by mouth every 6 (six) hours as needed. 12/08/17  Yes Pfeiffer, Marcy, MD  alum & mag hydroxide-simeth (MAALOX/MYLANTA) 200-200-20 MG/5ML suspension Take 30 mLs by mouth every 6 (six) hours as needed for indigestion or heartburn.   Yes [provider]  aspirin 81 MG chewable tablet Chew 81 mg by mouth daily.   Yes [provider]  B Complex-C (B-COMPLEX WITH VITAMIN C) tablet Take 1 tablet by mouth daily.   Yes [provider]  calcium citrate-vitamin D (CITRACAL+D) 315-200 MG-UNIT per tablet Take 1 tablet by mouth daily.   Yes [provider]  carboxymethylcellulose (REFRESH TEARS) 0.5 % SOLN Place 1 drop into both eyes 2 (two) times daily. 04/20/18  Yes [provider]  docusate sodium (COLACE) 100 MG capsule Take 200 mg by mouth daily.    Yes [provider]  fexofenadine (ALLEGRA) 60 MG tablet Take 60 mg by mouth daily.   Yes [provider]  furosemide (LASIX) 20 MG tablet Take 1 tablet by mouth daily. 10/10/15  Yes [provider]  ipratropium-albuterol (DUONEB) 0.5-2.5 (3) MG/3ML SOLN Take 3 mLs by nebulization 2 (two) times daily.    Yes [provider]  levothyroxine (SYNTHROID, LEVOTHROID) 150 MCG tablet Take 150 mcg by mouth daily.  10/14/15  Yes [provider]  magnesium hydroxide (MILK OF MAGNESIA) 400 MG/5ML suspension Take 30 mLs by mouth daily as needed for mild constipation.   Yes [provider]  Melatonin 3 MG TABS Take 3 mg by mouth at bedtime.    Yes [provider]  Multiple Vitamin (MULTIVITAMIN WITH MINERALS) TABS tablet Take 1  tablet by mouth daily.   Yes [provider]  potassium chloride (K-DUR,KLOR-CON) 10 MEQ tablet Take 10 mEq by mouth daily.  10/11/15  Yes [provider]    Family History Family History  Problem Relation Age of Onset  . Stroke Mother   . Heart disease Father     Social History Social History   Tobacco Use  . Smoking status: Never Smoker  . Smokeless tobacco: Never Used  Substance Use Topics  . Alcohol use: No  . Drug use: No     Allergies   Amoxicillin; Aspirin; Avelox [moxifloxacin]; Erythromycin; Monistat [miconazole]; Morphine and related; and Orange juice [orange oil]   Review of Systems Review of Systems Review of systems negative unless otherwise stated in the HPI.  Physical Exam Updated Vital Signs BP (!) 184/80   Pulse 96   Resp 14   SpO2 96%   Physical Exam  Constitutional: She appears well-developed and well-nourished. No distress.  HENT:  Head: Atraumatic.  Swelling and echymosis to the   right eyebrow.  Full EOM. No signs of ocular intrapment. No lacerations. Moderately hard of hearing. Able to communicate with loud speaking and use of written messages.  Eyes: Pupils are equal, round, and reactive to light.  Neck: Normal range of motion. Neck supple.  Patient in C-spine collar via EMS. mild midline tenderness to palpation of the cervical spine.    Cardiovascular: Normal rate and regular rhythm.  Pulmonary/Chest: Effort normal. No respiratory distress.  Tenderness to palpation of the chest.  No crepitus no ecchymosis or erythema  Abdominal: Soft. She exhibits no distension.  Nontender to palpation. No rigidity, ecchymosis, or erythema. Moderate diastases recti.   Musculoskeletal: Normal range of motion.  No back pain. Able to move on bed and sit up without difficulty. Full ROM upper and lower extremities.  No tenderness to palpation to bilateral elbows, ulna, radius wrists, hands.  5/5 grip strength.  Neurological: She is alert.    Patient is alert and interactive. She does not show any signs of confusion.  She expresses herself appropriately without slurred speech. Able to use all extremities. Movement is coordinated.   Skin: Skin is warm and dry. She is not diaphoretic.  Psychiatric: She has a normal mood and affect.  Nursing note and vitals reviewed.    ED Treatments / Results  Labs (all labs ordered are listed, but only abnormal results are displayed) Labs Reviewed  CBC WITH DIFFERENTIAL/PLATELET - Abnormal; Notable for the following components:      Result Value   HCT 46.3 (*)    All other components within normal limits  COMPREHENSIVE METABOLIC PANEL - Abnormal; Notable for the following components:   Glucose, Bld 112 (*)    BUN 27 (*)    Total Protein 6.4 (*)    Albumin 3.4 (*)    GFR calc non Af Amer 44 (*)    GFR calc Af Amer 51 (*)    All other components within normal limits    EKG None  Radiology Ct Head Wo Contrast  Result Date: 08/17/2018 CLINICAL DATA:  84-year-old female status post fall with neck pain, initial evaluation EXAM: CT HEAD WITHOUT CONTRAST CT CERVICAL SPINE WITHOUT CONTRAST TECHNIQUE: Multidetector CT imaging of the head and cervical spine was performed following the standard protocol without intravenous contrast. Multiplanar CT image reconstructions of the cervical spine were also generated. COMPARISON:  Most recent prior CT scan of the head and cervical spine 12/26/2017; prior head CT 06/19/2012 FINDINGS: CT HEAD FINDINGS Brain: No evidence of acute infarction, hemorrhage, hydrocephalus, extra-axial collection or mass lesion/mass effect. Vascular: No hyperdense vessel sign. Atherosclerotic calcifications in the bilateral cavernous carotid arteries. Skull: No acute fracture or lytic or blastic osseous lesion. Hyperostosis frontalis interna present bilaterally. Sinuses/Orbits: No acute finding. Other: Right forehead hematoma. Small 8 mm soft tissue nodule in the left parotid gland  demonstrates no significant interval change compared to fairly remote prior imaging from 06/19/2012. Six year stability is highly suggestive of benignity. CT CERVICAL SPINE FINDINGS Alignment: Normal. Skull base and vertebrae: No acute fracture. No primary bone lesion or focal pathologic process. Soft tissues and spinal canal: No prevertebral fluid or swelling. No visible canal hematoma. Disc levels: Multilevel degenerative disc disease. Prominent calcified posterior disc osteophyte complex at C2-C3. Additional levels of posterior disc osteophyte complex include C4-C5, C5-C6 and C6-C7. Ankylosis of the right C3-C4 facets. Upper chest: Negative. Other: None. IMPRESSION: CT HEAD 1. No acute intracranial abnormality. 2. Moderately large right forehead hematoma without evidence of underlying calvarial fracture.   3. Small left parotid nodule remains stable dating back to 06/19/2012. CT CSPINE 1. No acute fracture or malalignment. 2. Multilevel degenerative disc disease and facet arthropathy. Electronically Signed   By: Heath  McCullough M.D.   On: 08/17/2018 16:19   Ct Cervical Spine Wo Contrast  Result Date: 08/17/2018 CLINICAL DATA:  84-year-old female status post fall with neck pain, initial evaluation EXAM: CT HEAD WITHOUT CONTRAST CT CERVICAL SPINE WITHOUT CONTRAST TECHNIQUE: Multidetector CT imaging of the head and cervical spine was performed following the standard protocol without intravenous contrast. Multiplanar CT image reconstructions of the cervical spine were also generated. COMPARISON:  Most recent prior CT scan of the head and cervical spine 12/26/2017; prior head CT 06/19/2012 FINDINGS: CT HEAD FINDINGS Brain: No evidence of acute infarction, hemorrhage, hydrocephalus, extra-axial collection or mass lesion/mass effect. Vascular: No hyperdense vessel sign. Atherosclerotic calcifications in the bilateral cavernous carotid arteries. Skull: No acute fracture or lytic or blastic osseous lesion.  Hyperostosis frontalis interna present bilaterally. Sinuses/Orbits: No acute finding. Other: Right forehead hematoma. Small 8 mm soft tissue nodule in the left parotid gland demonstrates no significant interval change compared to fairly remote prior imaging from 06/19/2012. Six year stability is highly suggestive of benignity. CT CERVICAL SPINE FINDINGS Alignment: Normal. Skull base and vertebrae: No acute fracture. No primary bone lesion or focal pathologic process. Soft tissues and spinal canal: No prevertebral fluid or swelling. No visible canal hematoma. Disc levels: Multilevel degenerative disc disease. Prominent calcified posterior disc osteophyte complex at C2-C3. Additional levels of posterior disc osteophyte complex include C4-C5, C5-C6 and C6-C7. Ankylosis of the right C3-C4 facets. Upper chest: Negative. Other: None. IMPRESSION: CT HEAD 1. No acute intracranial abnormality. 2. Moderately large right forehead hematoma without evidence of underlying calvarial fracture. 3. Small left parotid nodule remains stable dating back to 06/19/2012. CT CSPINE 1. No acute fracture or malalignment. 2. Multilevel degenerative disc disease and facet arthropathy. Electronically Signed   By: Heath  McCullough M.D.   On: 08/17/2018 16:19    Procedures Procedures (including critical care time)  Medications Ordered in ED Medications  acetaminophen (TYLENOL) tablet 500 mg (500 mg Oral Given 08/17/18 1716)     Initial Impression / Assessment and Plan / ED Course  I have reviewed the triage vital signs and the nursing notes as well as the past medical history.  Pertinent labs & imaging results that were available during my care of the patient were reviewed by me and considered in my medical decision making (see chart for details).  84-year-old female presents for evaluation of head and neck pain after mechanical fall. Clear mental status. Patient is alone in ED without family on she will exam. Will obtain CT head,  neck to r/o bleed and cervical spine injury.   CT cervical spine without any evidence of acute fracture.  CT head with moderate size hematoma over the right forehead without any signs of fracture. Neuro exam normal.  Spoke with power of attorney her granddaughter, Jennifer to is present at discharge.  States patient is at her baseline mental status.  Jennifer states patient lives in assisted living facility.  However her family and her will need to discuss if the feel she needs to have increased level of care.  Gust strict return precautions with Jennifer and listed them in the DC instructions for the facility.  At DC patient is able to move all extremities and is able to ambulate without difficulty with staff.  Family members who voiced understanding of discharge   instructions and return precautions.  She is stable for discharge.    Final Clinical Impressions(s) / ED Diagnoses   Final diagnoses:  Fall, initial encounter  Contusion of other part of head, initial encounter    ED Discharge Orders    None       Johnie Stadel A, PA-C 08/17/18 1822    Maudie Flakes, MD 08/17/18 2309

## 2018-08-17 NOTE — ED Notes (Signed)
Patient transported to CT 

## 2018-08-17 NOTE — ED Triage Notes (Signed)
Pt bib ems from [facility] with reports of fall off of toilet. Pt states she just fell forward. Pt with neck pain and L arm pain along with bump to R temple. Pt A&OX4. Arrives in [place]. 150/78, HR 80, RR18, 94% RA.

## 2018-08-18 ENCOUNTER — Non-Acute Institutional Stay: Payer: Medicare Other | Admitting: Nurse Practitioner

## 2018-08-18 ENCOUNTER — Encounter: Payer: Self-pay | Admitting: Nurse Practitioner

## 2018-08-18 DIAGNOSIS — R609 Edema, unspecified: Secondary | ICD-10-CM | POA: Diagnosis not present

## 2018-08-18 DIAGNOSIS — W19XXXA Unspecified fall, initial encounter: Secondary | ICD-10-CM | POA: Diagnosis not present

## 2018-08-18 DIAGNOSIS — S0083XD Contusion of other part of head, subsequent encounter: Secondary | ICD-10-CM

## 2018-08-18 DIAGNOSIS — I10 Essential (primary) hypertension: Secondary | ICD-10-CM | POA: Diagnosis not present

## 2018-08-18 DIAGNOSIS — M2550 Pain in unspecified joint: Secondary | ICD-10-CM

## 2018-08-18 DIAGNOSIS — S0083XA Contusion of other part of head, initial encounter: Secondary | ICD-10-CM | POA: Insufficient documentation

## 2018-08-18 DIAGNOSIS — R296 Repeated falls: Secondary | ICD-10-CM | POA: Insufficient documentation

## 2018-08-18 NOTE — Progress Notes (Signed)
Location:   [facility] Room Number: 706 Place of Service: [facility] Provider:  Lennie Odor Kirubel Aja NP  Blanchie Serve, MD  Patient Care Team: Blanchie Serve, MD as PCP - General (Internal Medicine) Nyoka Cowden Viviann Spare, MD (Internal Medicine) [place] Usha Slager X, NP as Nurse Practitioner (Nurse Practitioner) Kathleen Argue, [facility] Harshita Bernales X, NP as Nurse Practitioner (Nurse Practitioner)  Extended Emergency Contact Information Primary Emergency Contact: [name] Phone: [phone] Relation: Son Secondary Emergency Contact: [place] Phone: [phone] Mobile Phone: [phone] Relation: Daughter  Code Status:  DNR Goals of care: Advanced Directive information Advanced Directives 08/17/2018  Does Patient Have a Medical Advance Directive? Yes  Type of Advance Directive Out of facility DNR (pink MOST or yellow form)  Does patient want to make changes to medical advance directive? -  Copy of [place] in Chart? -  Would patient like information on creating a medical advance directive? No - Patient declined  Pre-existing out of facility DNR order (yellow form or pink MOST form) -     Chief Complaint  Patient presents with  . Acute Visit    facial contusion sustained from fall 08/17/18 [facility], s/p ED visit.     HPI:  Pt is a 84 y.o. female seen today for right forehead, periorbital, facial contusion sustained from fall in her bathroom and landed on her face when she getting up from the toilet to wipe with tissue paper. She denied headache, dizziness, change of vision, chest pain/pressure, palpitation, or focal weakness associated with the event. ED: CMP CBC CT head/cervical spine: no acute intracranial abnormality or acute fracture or malalignment.  The patient denied neck or arm pain upon my examination today. Hx of leg edema, minimal on Furosemide 58m qd. OA pain, ,multiple sites, on Tylenol 6554m qhs and prn. HTN, normalized after her traumatic event 120/6032m.  Past Medical History:  Diagnosis Date  . Abnormal liver function tests    11/11/15 AST 31, ALT 88, alk phos 96   . Acute kidney failure, unspecified ([place]/10/2012  . Arthralgia 09/26/2014   Multiple joints: knees, shoulders, wrists, spine hips   . Arthritis   . Candidiasis of other urogenital sites 08/10/2012  . Cerebral embolism 05/23/2005  . CHF (congestive heart failure) ([place] . Cholelithiasis 11/04/2015  . Closed fracture of sacrum and coccyx without mention of spinal cord injury 02/14/2012  . Constipation 05/03/2013  . Contusion of face, scalp, and neck except eye(s) 06/01/2012  . Contusion of wrist 06/01/2012  . COPD (chronic obstructive pulmonary disease) ([place] . COPD, mild ([place]/07/2013  . Edema 04/20/2012  . H/O: CVA (cerebrovascular accident)   . Hearing loss 09/26/2014  . History of cancer of uterus   . HTN (hypertension), benign   . Hyperglycemia 11/14/2015  . Hypothyroidism   . Insomnia, unspecified 08/10/2012  . Open wound of knee, leg (except thigh), and ankle, without mention of complication 5/97/0/3500 Other and unspecified hyperlipidemia 06/01/2012  . Other disorder of coccyx 01/27/2012  . Pain in joint, ankle and foot 06/14/2012  . Peripheral vascular disease, unspecified ([place]/14/2013  . Personal history of fall 01/27/2012  . Pneumonia, organism unspecified(486) 01/23/2005  . Rheumatic fever 09/26/1933   Age 74 10/01/14 ESR 13, RAF <10   . Seasonal allergies 05/03/2013  . Stasis dermatitis of both legs 09/26/2014  . Unspecified constipation 11/02/2012  . Unspecified hearing loss 02/08/2013  . Unspecified  hereditary and idiopathic peripheral neuropathy 01/27/2012  . Urinary frequency 02/24/2014  . Ventricular fibrillation ([place]) 01/24/2012   Past Surgical History:  Procedure Laterality Date  . ABDOMINAL HYSTERECTOMY  1990   for endometrial cancer  . CATARACT EXTRACTION W/ INTRAOCULAR LENS   IMPLANT, BILATERAL    . Saline  . GUM SURGERY  1932  . MASTOIDECTOMY  1920   bilateral  . THYROIDECTOMY  1960  . TONSILLECTOMY  1916    Allergies  Allergen Reactions  . Amoxicillin     Unknown, patient unable to answer questionnaire   . Aspirin Other (See Comments)    On MAR  . Avelox [Moxifloxacin]     Unknown: listed on MAR  . Erythromycin     Unknown: listed on MAR  . Monistat [Miconazole]     Unknown: listed on MAR  . Morphine And Related     Unknown: listed on MAR  . Orange Juice [Orange Oil]     Unknown: listed on MAR    Allergies as of 08/18/2018      Reactions   Amoxicillin    Unknown, patient unable to answer questionnaire    Aspirin Other (See Comments)   On MAR   Avelox [moxifloxacin]    Unknown: listed on MAR   Erythromycin    Unknown: listed on MAR   Monistat [miconazole]    Unknown: listed on MAR   Morphine And Related    Unknown: listed on MAR   Orange Juice [orange Oil]    Unknown: listed on [facility]      Medication List        Accurate as of 08/18/18 11:59 PM. Always use your most recent med list.          acetaminophen 500 MG tablet Commonly known as:  TYLENOL Take 2 tablets (1,000 mg total) by mouth every 6 (six) hours as needed.   acetaminophen 325 MG tablet Commonly known as:  TYLENOL Take 650 mg by mouth at bedtime.   alum & mag hydroxide-simeth 200-200-20 MG/5ML suspension Commonly known as:  MAALOX/MYLANTA Take 30 mLs by mouth every 6 (six) hours as needed for indigestion or heartburn.   aspirin 81 MG chewable tablet Chew 81 mg by mouth daily.   B-complex with vitamin C tablet Take 1 tablet by mouth daily.   calcium citrate-vitamin D 315-200 MG-UNIT tablet Commonly known as:  CITRACAL+D Take 1 tablet by mouth daily.   docusate sodium 100 MG capsule Commonly known as:  COLACE Take 200 mg by mouth daily.   fexofenadine 60 MG tablet Commonly known as:  ALLEGRA Take 60 mg by mouth daily.     furosemide 20 MG tablet Commonly known as:  LASIX Take 1 tablet by mouth daily.   ipratropium-albuterol 0.5-2.5 (3) MG/3ML Soln Commonly known as:  DUONEB Take 3 mLs by nebulization 2 (two) times daily.   levothyroxine 150 MCG tablet Commonly known as:  SYNTHROID, LEVOTHROID Take 150 mcg by mouth daily.   magnesium hydroxide 400 MG/5ML suspension Commonly known as:  MILK OF MAGNESIA Take 30 mLs by mouth daily as needed for mild constipation.   Melatonin 3 MG Tabs Take 3 mg by mouth at bedtime.   multivitamin with minerals Tabs tablet Take 1 tablet by mouth daily.   potassium chloride 10 MEQ tablet Commonly known as:  K-DUR,KLOR-CON Take 10 mEq by mouth daily.   REFRESH TEARS 0.5 % Soln Generic drug:  carboxymethylcellulose Place 1 drop into both eyes 2 (two)  times daily.      ROS was provided with assistance of staff Review of Systems  Constitutional: Positive for fatigue. Negative for activity change, appetite change, chills, diaphoresis and fever.  HENT: Positive for facial swelling and hearing loss. Negative for congestion, nosebleeds, sinus pain, sore throat and voice change.   Respiratory: Negative for cough, shortness of breath and wheezing.   Cardiovascular: Positive for leg swelling. Negative for chest pain and palpitations.  Gastrointestinal: Negative for abdominal distention, abdominal pain, constipation, diarrhea, nausea and vomiting.  Genitourinary: Negative for difficulty urinating, dysuria and urgency.  Musculoskeletal: Positive for arthralgias and gait problem.  Skin: Positive for color change and wound.       Right facial contusion.   Neurological: Negative for dizziness, seizures, syncope, facial asymmetry, speech difficulty, weakness and headaches.       Memory lapses.   Psychiatric/Behavioral: Negative for agitation, behavioral problems, hallucinations and sleep disturbance. The patient is not nervous/anxious.     Immunization History   Administered Date(s) Administered  . Influenza Whole 10/19/2012  . Influenza-Unspecified 10/18/2013, 10/17/2014, 09/02/2015, 09/29/2016, 10/03/2017  . PPD Test 01/22/2013, 11/28/2015  . Pneumococcal Polysaccharide-23 01/22/2013  . Td 02/21/2009  . Tdap 04/07/2015   Pertinent  Health Maintenance Due  Topic Date Due  . DEXA SCAN  03/25/1978  . PNA vac Low Risk Adult (2 of 2 - PCV13) 01/22/2014  . INFLUENZA VACCINE  07/13/2018   Fall Risk  09/02/2017 11/14/2015 04/17/2015 09/26/2014 09/26/2014  Falls in the past year? No No Yes No Yes  Number falls in past yr: - - 1 - 2 or more  Injury with Fall? - - Yes - -  Comment - - laceration forehead 04/07/15 - -  Risk Factor Category  - - High Fall Risk - -  Risk for fall due to : - Impaired mobility;Impaired vision - - -   Functional Status Survey:    Vitals:   08/18/18 1457  BP: 120/60  Pulse: 82  Resp: 20  Temp: (!) 97.5 F (36.4 C)   There is no height or weight on file to calculate BMI. Physical Exam  Constitutional: She appears well-developed and well-nourished. No distress.  HENT:  Head: Normocephalic.  Mouth/Throat: Oropharynx is clear and moist. No oropharyngeal exudate.  Right forehead hematoma.   Eyes: Pupils are equal, round, and reactive to light. EOM are normal. Right eye exhibits no discharge. Left eye exhibits no discharge. No scleral icterus.  Right periorbital contusion, difficulty open right eye  Neck: Normal range of motion. Neck supple. No JVD present. No thyromegaly present.  Cardiovascular: Normal rate and regular rhythm.  No murmur heard. Pulmonary/Chest: Effort normal. She has no wheezes. She has no rales.  Abdominal: Soft. Bowel sounds are normal. She exhibits no distension. There is no tenderness. There is no rebound.  Musculoskeletal: She exhibits edema.  Needs assistance for transfer, ambulates with walker in her room, w/c to go further. Trace edema BLE. Chronic knees and legs pain.   Neurological: She  is alert. No cranial nerve deficit. She exhibits normal muscle tone. Coordination normal.  Oriented to person and place.   Skin: Skin is warm and dry. She is not diaphoretic.  Right forehead hematoma, right periorbital contusion, right facial abrasion  Psychiatric: She has a normal mood and affect. Her behavior is normal.    Labs reviewed: Recent Labs    12/26/17 0449 12/26/17 0509 02/28/18 05/30/18 08/17/18 1539  NA 140 141 142 141 138  K 4.8 4.5 4.8 3.8  4.2  CL 104 101  --  106 100  CO2 27  --   --  30 26  GLUCOSE 108* 103*  --   --  112*  BUN 30* 34* 45* 37* 27*  CREATININE 1.00 1.00 1.0 0.9 0.98  CALCIUM 9.1  --   --  8.7 9.4   Recent Labs    12/26/17 0449 05/30/18 08/17/18 1539  AST _0 ALT _1 ALKPHOS 59 54 60  BILITOT 0.7  --  0.4  PROT 6.0*  --  6.4*  ALBUMIN 3.4* 3.4 3.4*   Recent Labs    12/26/17 0449  02/28/18 05/30/18 08/17/18 1539  WBC 8.2  --  7.6 6.5 8.8  NEUTROABS 4.5  --   --   --  6.3  HGB 14.9   < > 13.6 13.7 14.7  HCT 46.7*   < > 40 41 46.3*  MCV 93.4  --   --   --  92.2  PLT 181  --  186 171 185   < > = values in this interval not displayed.   Lab Results  Component Value Date   TSH 1.19 05/30/2018   No results found for: HGBA1C No results found for: CHOL, HDL, LDLCALC, LDLDIRECT, TRIG, CHOLHDL  Significant Diagnostic Results in last 30 days:  Ct Head Wo Contrast  Result Date: 08/17/2018 CLINICAL DATA:  84 year old female status post fall with neck pain, initial evaluation EXAM: CT HEAD WITHOUT CONTRAST CT CERVICAL SPINE WITHOUT CONTRAST TECHNIQUE: Multidetector CT imaging of the head and cervical spine was performed following the standard protocol without intravenous contrast. Multiplanar CT image reconstructions of the cervical spine were also generated. COMPARISON:  Most recent prior CT scan of the head and cervical spine 12/26/2017; prior head CT 06/19/2012 FINDINGS: CT HEAD FINDINGS Brain: No evidence of acute infarction,  hemorrhage, hydrocephalus, extra-axial collection or mass lesion/mass effect. Vascular: No hyperdense vessel sign. Atherosclerotic calcifications in the bilateral cavernous carotid arteries. Skull: No acute fracture or lytic or blastic osseous lesion. Hyperostosis frontalis interna present bilaterally. Sinuses/Orbits: No acute finding. Other: Right forehead hematoma. Small 8 mm soft tissue nodule in the left parotid gland demonstrates no significant interval change compared to fairly remote prior imaging from 06/19/2012. Six year stability is highly suggestive of benignity. CT CERVICAL SPINE FINDINGS Alignment: Normal. Skull base and vertebrae: No acute fracture. No primary bone lesion or focal pathologic process. Soft tissues and spinal canal: No prevertebral fluid or swelling. No visible canal hematoma. Disc levels: Multilevel degenerative disc disease. Prominent calcified posterior disc osteophyte complex at C2-C3. Additional levels of posterior disc osteophyte complex include C4-C5, C5-C6 and C6-C7. Ankylosis of the right C3-C4 facets. Upper chest: Negative. Other: None. IMPRESSION: CT HEAD 1. No acute intracranial abnormality. 2. Moderately large right forehead hematoma without evidence of underlying calvarial fracture. 3. Small left parotid nodule remains stable dating back to 06/19/2012. CT CSPINE 1. No acute fracture or malalignment. 2. Multilevel degenerative disc disease and facet arthropathy. Electronically Signed   By: Jacqulynn Cadet M.D.   On: 08/17/2018 16:19   Ct Cervical Spine Wo Contrast  Result Date: 08/17/2018 CLINICAL DATA:  84 year old female status post fall with neck pain, initial evaluation EXAM: CT HEAD WITHOUT CONTRAST CT CERVICAL SPINE WITHOUT CONTRAST TECHNIQUE: Multidetector CT imaging of the head and cervical spine was performed following the standard protocol without intravenous contrast. Multiplanar CT image reconstructions of the cervical spine were also generated. COMPARISON:   Most recent prior  CT scan of the head and cervical spine 12/26/2017; prior head CT 06/19/2012 FINDINGS: CT HEAD FINDINGS Brain: No evidence of acute infarction, hemorrhage, hydrocephalus, extra-axial collection or mass lesion/mass effect. Vascular: No hyperdense vessel sign. Atherosclerotic calcifications in the bilateral cavernous carotid arteries. Skull: No acute fracture or lytic or blastic osseous lesion. Hyperostosis frontalis interna present bilaterally. Sinuses/Orbits: No acute finding. Other: Right forehead hematoma. Small 8 mm soft tissue nodule in the left parotid gland demonstrates no significant interval change compared to fairly remote prior imaging from 06/19/2012. Six year stability is highly suggestive of benignity. CT CERVICAL SPINE FINDINGS Alignment: Normal. Skull base and vertebrae: No acute fracture. No primary bone lesion or focal pathologic process. Soft tissues and spinal canal: No prevertebral fluid or swelling. No visible canal hematoma. Disc levels: Multilevel degenerative disc disease. Prominent calcified posterior disc osteophyte complex at C2-C3. Additional levels of posterior disc osteophyte complex include C4-C5, C5-C6 and C6-C7. Ankylosis of the right C3-C4 facets. Upper chest: Negative. Other: None. IMPRESSION: CT HEAD 1. No acute intracranial abnormality. 2. Moderately large right forehead hematoma without evidence of underlying calvarial fracture. 3. Small left parotid nodule remains stable dating back to 06/19/2012. CT CSPINE 1. No acute fracture or malalignment. 2. Multilevel degenerative disc disease and facet arthropathy. Electronically Signed   By: Jacqulynn Cadet M.D.   On: 08/17/2018 16:19    Assessment/Plan  Facial contusion right forehead, periorbital, facial contusion sustained from fall in her bathroom and landed on her face when she getting up from the toilet to wipe with tissue paper. She denied headache, dizziness, change of vision, chest pain/pressure,  palpitation, or focal weakness associated with the event. ED: CMP CBC CT head/cervical spine: no acute intracranial abnormality or acute fracture or malalignment. Continue cool compress for comfort. Monitor the patient.    Fall Lack of safety awareness and increased frailty, close supervision for safety. Continue [facility] for safety and care assistance.   Edema Trace edema BLE, continue Furosemide 61m qd.   HTN (hypertension) Controlled.   Arthralgia Multiple sites, continue Tylenol qhs and prn.    Family/ staff Communication: plan of care reviewed with the patient and charge nurse.   Labs/tests ordered: none  Time spend 25 minutes.

## 2018-08-18 NOTE — Assessment & Plan Note (Signed)
Controlled.  

## 2018-08-18 NOTE — Assessment & Plan Note (Signed)
Trace edema BLE, continue Furosemide 20mg qd 

## 2018-08-18 NOTE — Assessment & Plan Note (Signed)
right forehead, periorbital, facial contusion sustained from fall in her bathroom and landed on her face when she getting up from the toilet to wipe with tissue paper. She denied headache, dizziness, change of vision, chest pain/pressure, palpitation, or focal weakness associated with the event. ED: CMP CBC CT head/cervical spine: no acute intracranial abnormality or acute fracture or malalignment. Continue cool compress for comfort. Monitor the patient.

## 2018-08-18 NOTE — Assessment & Plan Note (Signed)
Lack of safety awareness and increased frailty, close supervision for safety. Continue [facility] for safety and care assistance.

## 2018-08-18 NOTE — Assessment & Plan Note (Signed)
Multiple sites, continue Tylenol qhs and prn.

## 2018-09-01 ENCOUNTER — Encounter: Payer: Self-pay | Admitting: Nurse Practitioner

## 2018-09-01 ENCOUNTER — Non-Acute Institutional Stay: Payer: Medicare Other | Admitting: Nurse Practitioner

## 2018-09-01 DIAGNOSIS — R2681 Unsteadiness on feet: Secondary | ICD-10-CM

## 2018-09-01 DIAGNOSIS — R609 Edema, unspecified: Secondary | ICD-10-CM

## 2018-09-01 DIAGNOSIS — E039 Hypothyroidism, unspecified: Secondary | ICD-10-CM

## 2018-09-01 DIAGNOSIS — M2550 Pain in unspecified joint: Secondary | ICD-10-CM

## 2018-09-01 DIAGNOSIS — Z7189 Other specified counseling: Secondary | ICD-10-CM | POA: Diagnosis not present

## 2018-09-01 DIAGNOSIS — S0083XD Contusion of other part of head, subsequent encounter: Secondary | ICD-10-CM

## 2018-09-01 NOTE — Assessment & Plan Note (Signed)
sustained right forehead hematoma from fall 08/17/18, underwent ED evaluation, healing nicely, no new focal neurological symptoms. Healing nicely.

## 2018-09-01 NOTE — Assessment & Plan Note (Signed)
Hx of hypothyroidism, continue Levothyroxine 150mcg qd, last TSH 1.19 05/30/18.

## 2018-09-01 NOTE — Assessment & Plan Note (Signed)
Hx of BLE edema, stable, continue Furosemide 20mg  qd.

## 2018-09-01 NOTE — Assessment & Plan Note (Signed)
unsteady gait, last fall 08/17/18 in her room, resulted in right forehead hematoma, s/s ED evaluation, healing nicely. Fall risk, needs close supervision for safety due to lack of safety awareness and increased frailty.

## 2018-09-01 NOTE — Progress Notes (Signed)
Location:  [facility] Room Number: 540 Place of Service:  ALF [phone]) Provider:  Margorie Renner, Lennie Odor  NP  Blanchie Serve, MD  Patient Care Team: Blanchie Serve, MD as PCP - General (Internal Medicine) [name], MD (Internal Medicine) [facility] Neftaly Inzunza X, NP as Nurse Practitioner (Nurse Practitioner) [facility] Stillman Buenger X, NP as Nurse Practitioner (Nurse Practitioner)  Extended Emergency Contact Information Primary Emergency Contact: [name] [place] Phone: [phone] Relation: Son Secondary Emergency Contact: [place] Phone: [phone] Mobile Phone: [phone] Relation: Daughter  Code Status:  DNR Goals of care: Advanced Directive information Advanced Directives 08/17/2018  Does Patient Have a Medical Advance Directive? Yes  Type of Advance Directive Out of facility DNR (pink MOST or yellow form)  Does patient want to make changes to medical advance directive? -  Copy of [place] in Chart? -  Would patient like information on creating a medical advance directive? No - Patient declined  Pre-existing out of facility DNR order (yellow form or pink MOST form) -     Chief Complaint  Patient presents with  . Medical Management of Chronic Issues    F/u- HTN, edema, COPD, CAD    HPI:  Pt is a 84 y.o. female seen today for medical management of chronic diseases.      The patient has unsteady gait, sustained right forehead hematoma from fall 08/17/18, underwent ED evaluation, healing nicely, no new focal neurological symptoms. Hx of BLE edema, stable on Furosemide 42m qd, she takes Tylneol 6515mnight for multiple osteoarthritis pains. Hx of hypothyroidism, on Levothyroxine 15067mqd, last TSH 1.19 05/30/18.    The patient's HPOA granddaughter present today for goals of care discussion.  Past Medical History:  Diagnosis Date  . Abnormal liver function tests    11/11/15 AST 31, ALT 88, alk phos 96   . Acute kidney failure, unspecified ([facility]/10/2012  . Arthralgia 09/26/2014   Multiple joints: knees, shoulders, wrists, spine hips   . Arthritis   . Candidiasis of other urogenital sites 08/10/2012  . Cerebral embolism 05/23/2005  . CHF (congestive heart failure) ([facility] . Cholelithiasis 11/04/2015  . Closed fracture of sacrum and coccyx without mention of spinal cord injury 02/14/2012  . Constipation 05/03/2013  . Contusion of face, scalp, and neck except eye(s) 06/01/2012  . Contusion of wrist 06/01/2012  . COPD (chronic obstructive pulmonary disease) ([facility] . COPD, mild ([facility]CC[place]/07/2013  . Edema 04/20/2012  . H/O: CVA (cerebrovascular accident)   . Hearing loss 09/26/2014  . History of cancer of uterus   . HTN (hypertension), benign   . Hyperglycemia 11/14/2015  . Hypothyroidism   . Insomnia, unspecified 08/10/2012  . Open wound of knee, leg (except thigh), and ankle, without mention of complication 5/97/05/7340 Other and unspecified hyperlipidemia 06/01/2012  . Other disorder of coccyx 01/27/2012  . Pain in joint, ankle and foot 06/14/2012  . Peripheral vascular disease, unspecified (HCC[place]/14/2013  . Personal history of fall 01/27/2012  . Pneumonia, organism unspecified(486) 01/23/2005  . Rheumatic fever 09/26/1933   Age 53 10/01/14 ESR 13, RAF <10   . Seasonal allergies 05/03/2013  . Stasis dermatitis of both legs 09/26/2014  . Unspecified constipation 11/02/2012  . Unspecified hearing loss 02/08/2013  . Unspecified hereditary and idiopathic peripheral neuropathy 01/27/2012  . Urinary frequency 02/24/2014  . Ventricular fibrillation (HCCRacker[place]/10/2012   Past Surgical History:  Procedure Laterality Date  .  ABDOMINAL HYSTERECTOMY  1990   for endometrial cancer  . CATARACT EXTRACTION W/ INTRAOCULAR LENS  IMPLANT, BILATERAL    . [place]  . GUM SURGERY  1932  . MASTOIDECTOMY  1920   bilateral  . THYROIDECTOMY  1960  .  TONSILLECTOMY  1916    Allergies  Allergen Reactions  . Amoxicillin     Unknown, patient unable to answer questionnaire   . Aspirin Other (See Comments)    On MAR  . Avelox [Moxifloxacin]     Unknown: listed on MAR  . Erythromycin     Unknown: listed on MAR  . Monistat [Miconazole]     Unknown: listed on MAR  . Morphine And Related     Unknown: listed on MAR  . Orange Juice [Orange Oil]     Unknown: listed on [facility]    Outpatient Encounter Medications as of 09/01/2018  Medication Sig  . acetaminophen (TYLENOL) 325 MG tablet Take 650 mg by mouth at bedtime.   Marland Kitchen acetaminophen (TYLENOL) 500 MG tablet Take 2 tablets (1,000 mg total) by mouth every 6 (six) hours as needed.  Marland Kitchen alum & mag hydroxide-simeth (MAALOX/MYLANTA) 200-200-20 MG/5ML suspension Take 30 mLs by mouth every 6 (six) hours as needed for indigestion or heartburn.  Marland Kitchen aspirin 81 MG chewable tablet Chew 81 mg by mouth daily.  . B Complex-C (B-COMPLEX WITH VITAMIN C) tablet Take 1 tablet by mouth daily.  . calcium citrate-vitamin D (CITRACAL+D) 315-200 MG-UNIT per tablet Take 1 tablet by mouth daily.  . carboxymethylcellulose (REFRESH TEARS) 0.5 % SOLN Place 1 drop into both eyes 2 (two) times daily.  Marland Kitchen docusate sodium (COLACE) 100 MG capsule Take 200 mg by mouth daily.   . fexofenadine (ALLEGRA) 60 MG tablet Take 60 mg by mouth daily.  . furosemide (LASIX) 20 MG tablet Take 1 tablet by mouth daily.  Marland Kitchen ipratropium-albuterol (DUONEB) 0.5-2.5 (3) MG/3ML SOLN Take 3 mLs by nebulization 2 (two) times daily.   Marland Kitchen levothyroxine (SYNTHROID, LEVOTHROID) 150 MCG tablet Take 150 mcg by mouth daily.   . magnesium hydroxide (MILK OF MAGNESIA) 400 MG/5ML suspension Take 30 mLs by mouth daily as needed for mild constipation.  . Melatonin 3 MG TABS Take 3 mg by mouth at bedtime.   . Multiple Vitamin (MULTIVITAMIN WITH MINERALS) TABS tablet Take 1 tablet by mouth daily.  . potassium chloride (K-DUR,KLOR-CON) 10 MEQ tablet Take 10 mEq by mouth  daily.    No facility-administered encounter medications on file as of 09/01/2018.    ROS was provided with assistance of staff Review of Systems  Constitutional: Positive for fatigue. Negative for activity change, appetite change, chills, diaphoresis, fever and unexpected weight change.  HENT: Positive for facial swelling and hearing loss. Negative for congestion.   Respiratory: Negative for cough, shortness of breath and wheezing.   Cardiovascular: Positive for leg swelling. Negative for chest pain and palpitations.  Gastrointestinal: Negative for abdominal distention, constipation, diarrhea, nausea and vomiting.  Genitourinary: Negative for difficulty urinating, dysuria and urgency.  Musculoskeletal: Positive for arthralgias and gait problem.  Skin: Positive for wound.  Neurological: Negative for dizziness, speech difficulty, weakness and headaches.       Memory lapses.   Psychiatric/Behavioral: Negative for agitation and behavioral problems. The patient is not nervous/anxious.     Immunization History  Administered Date(s) Administered  . Influenza Whole 10/19/2012  . Influenza-Unspecified 10/18/2013, 10/17/2014, 09/02/2015, 09/29/2016, 10/03/2017  . PPD Test 01/22/2013, 11/28/2015  . Pneumococcal Polysaccharide-23 01/22/2013  .  Td 02/21/2009  . Tdap 04/07/2015   Pertinent  Health Maintenance Due  Topic Date Due  . DEXA SCAN  03/25/1978  . PNA vac Low Risk Adult (2 of 2 - PCV13) 01/22/2014  . INFLUENZA VACCINE  07/13/2018   Fall Risk  09/02/2017 11/14/2015 04/17/2015 09/26/2014 09/26/2014  Falls in the past year? No No Yes No Yes  Number falls in past yr: - - 1 - 2 or more  Injury with Fall? - - Yes - -  Comment - - laceration forehead 04/07/15 - -  Risk Factor Category  - - High Fall Risk - -  Risk for fall due to : - Impaired mobility;Impaired vision - - -   Functional Status Survey:    Vitals:   09/01/18 1058  BP: 132/68  Pulse: 78  Resp: 16  Temp: (!) 97.2 F (36.2  C)  SpO2: 93%  Weight: 153 lb 9.6 oz (69.7 kg)  Height: _0  (1.575 m)   Body mass index is 28.09 kg/m. Physical Exam  Constitutional: She appears well-developed and well-nourished.  HENT:  Head: Normocephalic and atraumatic.  Nose: Nose normal.  Eyes: Pupils are equal, round, and reactive to light. EOM are normal.  Neck: Normal range of motion. Neck supple. No JVD present. No thyromegaly present.  Cardiovascular: Normal rate and regular rhythm.  No murmur heard. Pulmonary/Chest: Effort normal. She has no wheezes. She has no rales.  Abdominal: Soft. She exhibits no distension. There is no rebound and no guarding.  Musculoskeletal: She exhibits edema.  Trace edema BLE. Needs assistance for transfer. Ambulates with walker for a few steps, otherwise w/c for mobility.   Neurological: She is alert. No cranial nerve deficit. She exhibits normal muscle tone. Coordination normal.  Oriented to person and place.   Skin: Skin is warm and dry.  Right forehead hematoma about a golf ball size, turning yellowish color, ecchymoses R peri orbital, cheek and under chin are resolving.   Psychiatric: She has a normal mood and affect. Her behavior is normal.    Labs reviewed: Recent Labs    12/26/17 0449 12/26/17 0509 02/28/18 05/30/18 08/17/18 1539  NA 140 141 142 141 138  K 4.8 4.5 4.8 3.8 4.2  CL 104 101  --  106 100  CO2 27  --   --  30 26  GLUCOSE 108* 103*  --   --  112*  BUN 30* 34* 45* 37* 27*  CREATININE 1.00 1.00 1.0 0.9 0.98  CALCIUM 9.1  --   --  8.7 9.4   Recent Labs    12/26/17 0449 05/30/18 08/17/18 1539  AST _1 ALT _2 ALKPHOS 59 54 60  BILITOT 0.7  --  0.4  PROT 6.0*  --  6.4*  ALBUMIN 3.4* 3.4 3.4*   Recent Labs    12/26/17 0449  02/28/18 05/30/18 08/17/18 1539  WBC 8.2  --  7.6 6.5 8.8  NEUTROABS 4.5  --   --   --  6.3  HGB 14.9   < > 13.6 13.7 14.7  HCT 46.7*   < > 40 41 46.3*  MCV 93.4  --   --   --  92.2  PLT 181  --  186 171 185   < > =  values in this interval not displayed.   Lab Results  Component Value Date   TSH 1.19 05/30/2018   No results found for: HGBA1C No results found for: CHOL, HDL, LDLCALC,  LDLDIRECT, TRIG, CHOLHDL  Significant Diagnostic Results in last 30 days:  Ct Head Wo Contrast  Result Date: 08/17/2018 CLINICAL DATA:  84 year old female status post fall with neck pain, initial evaluation EXAM: CT HEAD WITHOUT CONTRAST CT CERVICAL SPINE WITHOUT CONTRAST TECHNIQUE: Multidetector CT imaging of the head and cervical spine was performed following the standard protocol without intravenous contrast. Multiplanar CT image reconstructions of the cervical spine were also generated. COMPARISON:  Most recent prior CT scan of the head and cervical spine 12/26/2017; prior head CT 06/19/2012 FINDINGS: CT HEAD FINDINGS Brain: No evidence of acute infarction, hemorrhage, hydrocephalus, extra-axial collection or mass lesion/mass effect. Vascular: No hyperdense vessel sign. Atherosclerotic calcifications in the bilateral cavernous carotid arteries. Skull: No acute fracture or lytic or blastic osseous lesion. Hyperostosis frontalis interna present bilaterally. Sinuses/Orbits: No acute finding. Other: Right forehead hematoma. Small 8 mm soft tissue nodule in the left parotid gland demonstrates no significant interval change compared to fairly remote prior imaging from 06/19/2012. Six year stability is highly suggestive of benignity. CT CERVICAL SPINE FINDINGS Alignment: Normal. Skull base and vertebrae: No acute fracture. No primary bone lesion or focal pathologic process. Soft tissues and spinal canal: No prevertebral fluid or swelling. No visible canal hematoma. Disc levels: Multilevel degenerative disc disease. Prominent calcified posterior disc osteophyte complex at C2-C3. Additional levels of posterior disc osteophyte complex include C4-C5, C5-C6 and C6-C7. Ankylosis of the right C3-C4 facets. Upper chest: Negative. Other: None.  IMPRESSION: CT HEAD 1. No acute intracranial abnormality. 2. Moderately large right forehead hematoma without evidence of underlying calvarial fracture. 3. Small left parotid nodule remains stable dating back to 06/19/2012. CT CSPINE 1. No acute fracture or malalignment. 2. Multilevel degenerative disc disease and facet arthropathy. Electronically Signed   By: Jacqulynn Cadet M.D.   On: 08/17/2018 16:19   Ct Cervical Spine Wo Contrast  Result Date: 08/17/2018 CLINICAL DATA:  84 year old female status post fall with neck pain, initial evaluation EXAM: CT HEAD WITHOUT CONTRAST CT CERVICAL SPINE WITHOUT CONTRAST TECHNIQUE: Multidetector CT imaging of the head and cervical spine was performed following the standard protocol without intravenous contrast. Multiplanar CT image reconstructions of the cervical spine were also generated. COMPARISON:  Most recent prior CT scan of the head and cervical spine 12/26/2017; prior head CT 06/19/2012 FINDINGS: CT HEAD FINDINGS Brain: No evidence of acute infarction, hemorrhage, hydrocephalus, extra-axial collection or mass lesion/mass effect. Vascular: No hyperdense vessel sign. Atherosclerotic calcifications in the bilateral cavernous carotid arteries. Skull: No acute fracture or lytic or blastic osseous lesion. Hyperostosis frontalis interna present bilaterally. Sinuses/Orbits: No acute finding. Other: Right forehead hematoma. Small 8 mm soft tissue nodule in the left parotid gland demonstrates no significant interval change compared to fairly remote prior imaging from 06/19/2012. Six year stability is highly suggestive of benignity. CT CERVICAL SPINE FINDINGS Alignment: Normal. Skull base and vertebrae: No acute fracture. No primary bone lesion or focal pathologic process. Soft tissues and spinal canal: No prevertebral fluid or swelling. No visible canal hematoma. Disc levels: Multilevel degenerative disc disease. Prominent calcified posterior disc osteophyte complex at  C2-C3. Additional levels of posterior disc osteophyte complex include C4-C5, C5-C6 and C6-C7. Ankylosis of the right C3-C4 facets. Upper chest: Negative. Other: None. IMPRESSION: CT HEAD 1. No acute intracranial abnormality. 2. Moderately large right forehead hematoma without evidence of underlying calvarial fracture. 3. Small left parotid nodule remains stable dating back to 06/19/2012. CT CSPINE 1. No acute fracture or malalignment. 2. Multilevel degenerative disc disease and facet arthropathy. Electronically  Signed   By: Jacqulynn Cadet M.D.   On: 08/17/2018 16:19    Assessment/Plan Gait instability unsteady gait, last fall 08/17/18 in her room, resulted in right forehead hematoma, s/s ED evaluation, healing nicely. Fall risk, needs close supervision for safety due to lack of safety awareness and increased frailty.   Facial contusion sustained right forehead hematoma from fall 08/17/18, underwent ED evaluation, healing nicely, no new focal neurological symptoms. Healing nicely.   Edema Hx of BLE edema, stable, continue Furosemide 64m qd.   Arthralgia Continue  Tylneol 6565mnight for multiple osteoarthritis pains.   Hypothyroidism Hx of hypothyroidism, continue Levothyroxine 15068mqd, last TSH 1.19 05/30/18.    Advanced care planning/counseling discussion Reviewed goals of care with the patient's HCPOA-granddaughter. Social worker and myself present. Reviewed current documents. The patient has a living will and HCPOA paperwork. DNR form updated and signed. MOST form reviewed and filled out from 1:30 pm to 1:55 pm . The patient would like to remain a DNR in absence of pulse or breath. In case of medical illness, she  would like to be transferred to hospital if indicated, avoid intensive care, desires limited additional interventions. She agrees to determine use of antibiotics when infection occurs and IV fluids for a defined trial period. No feeding tube. Form singed by the patient's  representative-granddaughter, socEducation officer, museumnd myself. Copies made for chart and family.      Family/ staff Communication: plan of care reviewed with the patent, patient's POA, charge nurse.   Labs/tests ordered:  none  Time spend 25 minutes

## 2018-09-01 NOTE — Assessment & Plan Note (Signed)
Continue  Tylneol 650mg  night for multiple osteoarthritis pains.

## 2018-09-04 NOTE — Assessment & Plan Note (Addendum)
Reviewed goals of care with the patient's HCPOA-granddaughter. Social worker and myself present. Reviewed current documents. The patient has a living will and HCPOA paperwork. DNR form updated and signed. MOST form reviewed and filled out from 1:30 pm to 1:55 pm . The patient would like to remain a DNR in absence of pulse or breath. In case of medical illness, she  would like to be transferred to hospital if indicated, avoid intensive care, desires limited additional interventions. She agrees to determine use of antibiotics when infection occurs and IV fluids for a defined trial period. No feeding tube. Form singed by the patient's representative-granddaughter, Child psychotherapistsocial worker, and myself. Copies made for chart and family.

## 2018-09-05 ENCOUNTER — Non-Acute Institutional Stay: Payer: Medicare Other

## 2018-09-05 ENCOUNTER — Non-Acute Institutional Stay: Payer: Medicare Other | Admitting: Nurse Practitioner

## 2018-09-05 DIAGNOSIS — M2550 Pain in unspecified joint: Secondary | ICD-10-CM | POA: Diagnosis not present

## 2018-09-05 DIAGNOSIS — L02415 Cutaneous abscess of right lower limb: Secondary | ICD-10-CM | POA: Diagnosis not present

## 2018-09-05 DIAGNOSIS — R609 Edema, unspecified: Secondary | ICD-10-CM | POA: Diagnosis not present

## 2018-09-05 DIAGNOSIS — L03115 Cellulitis of right lower limb: Secondary | ICD-10-CM

## 2018-09-05 DIAGNOSIS — Z0000 Encounter for general adult medical examination without abnormal findings: Secondary | ICD-10-CM | POA: Diagnosis not present

## 2018-09-05 NOTE — Patient Instructions (Signed)
[name] , Thank you for taking time to come for your Medicare Wellness Visit. I appreciate your ongoing commitment to your health goals. Please review the following plan we discussed and let me know if I can assist you in the future.   Screening recommendations/referrals: Colonoscopy excluded, over age 82 Mammogram excluded, over age 82 Bone Density due, excluded due to age Recommended yearly ophthalmology/optometry visit for glaucoma screening and checkup Recommended yearly dental visit for hygiene and checkup  Vaccinations: Influenza vaccine due, [facility] Pneumococcal vaccine up to date, completed Tdap vaccine up to date, due 04/06/2025 Shingles vaccine not in past records    Advanced directives: in chart  Conditions/risks identified: none  Next appointment: Dr. Glade LloydPandey makes rounds   Preventive Care 65 Years and Older, Female Preventive care refers to lifestyle choices and visits with your health care provider that can promote health and wellness. What does preventive care include?  A yearly physical exam. This is also called an annual well check.  Dental exams once or twice a year.  Routine eye exams. Ask your health care provider how often you should have your eyes checked.  Personal lifestyle choices, including:  Daily care of your teeth and gums.  Regular physical activity.  Eating a healthy diet.  Avoiding tobacco and drug use.  Limiting alcohol use.  Practicing safe sex.  Taking low-dose aspirin every day.  Taking vitamin and mineral supplements as recommended by your health care provider. What happens during an annual well check? The services and screenings done by your health care provider during your annual well check will depend on your age, overall health, lifestyle risk factors, and family history of disease. Counseling  Your health care provider may ask you questions about your:  Alcohol use.  Tobacco use.  Drug use.  Emotional  well-being.  Home and relationship well-being.  Sexual activity.  Eating habits.  History of falls.  Memory and ability to understand (cognition).  Work and work Astronomerenvironment.  Reproductive health. Screening  You may have the following tests or measurements:  Height, weight, and BMI.  Blood pressure.  Lipid and cholesterol levels. These may be checked every 5 years, or more frequently if you are over 82 years old.  Skin check.  Lung cancer screening. You may have this screening every year starting at age 455 if you have a 30-pack-year history of smoking and currently smoke or have quit within the past 15 years.  Fecal occult blood test (FOBT) of the stool. You may have this test every year starting at age 82.  Flexible sigmoidoscopy or colonoscopy. You may have a sigmoidoscopy every 5 years or a colonoscopy every 10 years starting at age 82.  Hepatitis C blood test.  Hepatitis B blood test.  Sexually transmitted disease (STD) testing.  Diabetes screening. This is done by checking your blood sugar (glucose) after you have not eaten for a while (fasting). You may have this done every 1-3 years.  Bone density scan. This is done to screen for osteoporosis. You may have this done starting at age 82.  Mammogram. This may be done every 1-2 years. Talk to your health care provider about how often you should have regular mammograms. Talk with your health care provider about your test results, treatment options, and if necessary, the need for more tests. Vaccines  Your health care provider may recommend certain vaccines, such as:  Influenza vaccine. This is recommended every year.  Tetanus, diphtheria, and acellular pertussis (Tdap,  Td) vaccine. You may need a Td booster every 10 years.  Zoster vaccine. You may need this after age 20.  Pneumococcal 13-valent conjugate (PCV13) vaccine. One dose is recommended after age 70.  Pneumococcal polysaccharide (PPSV23) vaccine. One  dose is recommended after age 52. Talk to your health care provider about which screenings and vaccines you need and how often you need them. This information is not intended to replace advice given to you by your health care provider. Make sure you discuss any questions you have with your health care provider. Document Released: 12/26/2015 Document Revised: 08/18/2016 Document Reviewed: 09/30/2015 Elsevier Interactive Patient Education  2017 [employer] Prevention in the Home Falls can cause injuries. They can happen to people of all ages. There are many things you can do to make your home safe and to help prevent falls. What can I do on the outside of my home?  Regularly fix the edges of walkways and driveways and fix any cracks.  Remove anything that might make you trip as you walk through a door, such as a raised step or threshold.  Trim any bushes or trees on the path to your home.  Use bright outdoor lighting.  Clear any walking paths of anything that might make someone trip, such as rocks or tools.  Regularly check to see if handrails are loose or broken. Make sure that both sides of any steps have handrails.  Any raised decks and porches should have guardrails on the edges.  Have any leaves, snow, or ice cleared regularly.  Use sand or salt on walking paths during winter.  Clean up any spills in your garage right away. This includes oil or grease spills. What can I do in the bathroom?  Use night lights.  Install grab bars by the toilet and in the tub and shower. Do not use towel bars as grab bars.  Use non-skid mats or decals in the tub or shower.  If you need to sit down in the shower, use a plastic, non-slip stool.  Keep the floor dry. Clean up any water that spills on the floor as soon as it happens.  Remove soap buildup in the tub or shower regularly.  Attach bath mats securely with double-sided non-slip rug tape.  Do not have throw rugs and other  things on the floor that can make you trip. What can I do in the bedroom?  Use night lights.  Make sure that you have a light by your bed that is easy to reach.  Do not use any sheets or blankets that are too big for your bed. They should not hang down onto the floor.  Have a firm chair that has side arms. You can use this for support while you get dressed.  Do not have throw rugs and other things on the floor that can make you trip. What can I do in the kitchen?  Clean up any spills right away.  Avoid walking on wet floors.  Keep items that you use a lot in easy-to-reach places.  If you need to reach something above you, use a strong step stool that has a grab bar.  Keep electrical cords out of the way.  Do not use floor polish or wax that makes floors slippery. If you must use wax, use non-skid floor wax.  Do not have throw rugs and other things on the floor that can make you trip. What can I do with my stairs?  Do not  leave any items on the stairs.  Make sure that there are handrails on both sides of the stairs and use them. Fix handrails that are broken or loose. Make sure that handrails are as long as the stairways.  Check any carpeting to make sure that it is firmly attached to the stairs. Fix any carpet that is loose or worn.  Avoid having throw rugs at the top or bottom of the stairs. If you do have throw rugs, attach them to the floor with carpet tape.  Make sure that you have a light switch at the top of the stairs and the bottom of the stairs. If you do not have them, ask someone to add them for you. What else can I do to help prevent falls?  Wear shoes that:  Do not have high heels.  Have rubber bottoms.  Are comfortable and fit you well.  Are closed at the toe. Do not wear sandals.  If you use a stepladder:  Make sure that it is fully opened. Do not climb a closed stepladder.  Make sure that both sides of the stepladder are locked into place.  Ask  someone to hold it for you, if possible.  Clearly mark and make sure that you can see:  Any grab bars or handrails.  First and last steps.  Where the edge of each step is.  Use tools that help you move around (mobility aids) if they are needed. These include:  Canes.  Walkers.  Scooters.  Crutches.  Turn on the lights when you go into a dark area. Replace any light bulbs as soon as they burn out.  Set up your furniture so you have a clear path. Avoid moving your furniture around.  If any of your floors are uneven, fix them.  If there are any pets around you, be aware of where they are.  Review your medicines with your doctor. Some medicines can make you feel dizzy. This can increase your chance of falling. Ask your doctor what other things that you can do to help prevent falls. This information is not intended to replace advice given to you by your health care provider. Make sure you discuss any questions you have with your health care provider. Document Released: 09/25/2009 Document Revised: 05/06/2016 Document Reviewed: 01/03/2015 Elsevier Interactive Patient Education  2017 [employer].

## 2018-09-05 NOTE — Progress Notes (Signed)
Location:   [facility] Room Number:923 Place of Service:   ALF (13) Provider:  Briggs Edelen, ManXie  NP  Blanchie Serve, MD  Patient Care Team: Blanchie Serve, MD as PCP - General (Internal Medicine) [name], MD (Internal Medicine) [place] Cailyn Houdek X, NP as Nurse Practitioner (Nurse Practitioner) [place] Leandrea Ackley X, NP as Nurse Practitioner (Nurse Practitioner)  Extended Emergency Contact Information Primary Emergency Contact: [name] [place] Phone: [phone] Relation: Son Secondary Emergency Contact: [place] Phone: [phone] Mobile Phone: [phone] Relation: Daughter  Code Status:  DNR Goals of care: Advanced Directive information Advanced Directives 09/07/2018  Does Patient Have a Medical Advance Directive? Yes  Type of Paramedic of [place];Living will;Out of facility DNR (pink MOST or yellow form)  Does patient want to make changes to medical advance directive? -  Copy of [place] in Chart? Yes  Would patient like information on creating a medical advance directive? -  Pre-existing out of facility DNR order (yellow form or pink MOST form) Yellow form placed in chart (order not valid for inpatient use)     Chief Complaint  Patient presents with  . Acute Visit    (R) lower leg edema    HPI:  Pt is a 84 y.o. female seen today for an acute visit for right lower leg swelling, redness, warmth, superficial skin opens in linear fashion, and c/o painful when touched for x1 day. Hx of edema BLE, on Furosemide 70m qd, she denied chest pain/pressure, palpitation, cough, sputum production, or SOB. OA pain is managed on Tylenol 6532mqhs and prn.    Past Medical History:  Diagnosis Date  . Abnormal liver function tests    11/11/15 AST 31, ALT 88, alk phos 96   . Acute kidney failure, unspecified ([facility]2/10/2012  .  Arthralgia 09/26/2014   Multiple joints: knees, shoulders, wrists, spine hips   . Arthritis   . Candidiasis of other urogenital sites 08/10/2012  . Cerebral embolism 05/23/2005  . CHF (congestive heart failure) ([facility]  . Cholelithiasis 11/04/2015  . Closed fracture of sacrum and coccyx without mention of spinal cord injury 02/14/2012  . Constipation 05/03/2013  . Contusion of face, scalp, and neck except eye(s) 06/01/2012  . Contusion of wrist 06/01/2012  . COPD (chronic obstructive pulmonary disease) ([facility]  . COPD, mild ([facility]/07/2013  . Edema 04/20/2012  . H/O: CVA (cerebrovascular accident)   . Hearing loss 09/26/2014  . History of cancer of uterus   . HTN (hypertension), benign   . Hyperglycemia 11/14/2015  . Hypothyroidism   . Insomnia, unspecified 08/10/2012  . Open wound of knee, leg (except thigh), and ankle, without mention of complication 5/05/21/4853. Other and unspecified hyperlipidemia 06/01/2012  . Other disorder of coccyx 01/27/2012  . Pain in joint, ankle and foot 06/14/2012  . Peripheral vascular disease, unspecified ([facility]2/14/2013  . Personal history of fall 01/27/2012  . Pneumonia, organism unspecified(486) 01/23/2005  . Rheumatic fever 09/26/1933   Age 33 10/01/14 ESR 13, RAF <10   . Seasonal allergies 05/03/2013  . Stasis dermatitis of both legs 09/26/2014  . Unspecified constipation 11/02/2012  . Unspecified hearing loss 02/08/2013  . Unspecified hereditary and idiopathic peripheral neuropathy 01/27/2012  . Urinary frequency 02/24/2014  . Ventricular fibrillation ([facility]2/10/2012   Past Surgical History:  Procedure Laterality Date  . ABDOMINAL HYSTERECTOMY  1990   for endometrial cancer  .  CATARACT EXTRACTION W/ INTRAOCULAR LENS  IMPLANT, BILATERAL    . [place]  . GUM SURGERY  1932  . MASTOIDECTOMY  1920   bilateral  . THYROIDECTOMY  1960  . TONSILLECTOMY  1916    Allergies  Allergen Reactions  . Amoxicillin     Unknown, patient unable to  answer questionnaire   . Aspirin Other (See Comments)    On MAR  . Avelox [Moxifloxacin]     Unknown: listed on MAR  . Erythromycin     Unknown: listed on MAR  . Monistat [Miconazole]     Unknown: listed on MAR  . Morphine And Related     Unknown: listed on MAR  . Orange Juice [Orange Oil]     [facility]    Outpatient Encounter Medications as of 09/05/2018  Medication Sig  . acetaminophen (TYLENOL) 325 MG tablet Take 650 mg by mouth at bedtime.   Marland Kitchen acetaminophen (TYLENOL) 500 MG tablet Take 2 tablets (1,000 mg total) by mouth every 6 (six) hours as needed.  Marland Kitchen alum & mag hydroxide-simeth (MAALOX/MYLANTA) 200-200-20 MG/5ML suspension Take 30 mLs by mouth every 6 (six) hours as needed for indigestion or heartburn.  Marland Kitchen aspirin 81 MG chewable tablet Chew 81 mg by mouth daily.  . B Complex-C (B-COMPLEX WITH VITAMIN C) tablet Take 1 tablet by mouth daily.  . calcium citrate-vitamin D (CITRACAL+D) 315-200 MG-UNIT per tablet Take 1 tablet by mouth daily.  . carboxymethylcellulose (REFRESH TEARS) 0.5 % SOLN Place 1 drop into both eyes 2 (two) times daily.  Marland Kitchen docusate sodium (COLACE) 100 MG capsule Take 200 mg by mouth daily.   . fexofenadine (ALLEGRA) 60 MG tablet Take 60 mg by mouth daily.  . furosemide (LASIX) 20 MG tablet Take 1 tablet by mouth daily.  Marland Kitchen ipratropium-albuterol (DUONEB) 0.5-2.5 (3) MG/3ML SOLN Take 3 mLs by nebulization 2 (two) times daily.   Marland Kitchen levothyroxine (SYNTHROID, LEVOTHROID) 150 MCG tablet Take 150 mcg by mouth daily.   . magnesium hydroxide (MILK OF MAGNESIA) 400 MG/5ML suspension Take 30 mLs by mouth daily as needed for mild constipation.  . Melatonin 3 MG TABS Take 3 mg by mouth at bedtime.   . Multiple Vitamin (MULTIVITAMIN WITH MINERALS) TABS tablet Take 1 tablet by mouth daily.  . potassium chloride (K-DUR,KLOR-CON) 10 MEQ tablet Take 10 mEq by mouth daily.    No facility-administered encounter medications on file as of 09/05/2018.     Review of  Systems  Constitutional: Positive for fatigue. Negative for activity change, appetite change, chills, diaphoresis, fever and unexpected weight change.  HENT: Positive for hearing loss. Negative for congestion and voice change.   Respiratory: Negative for cough, shortness of breath and wheezing.   Cardiovascular: Positive for leg swelling. Negative for chest pain and palpitations.  Gastrointestinal: Negative for abdominal distention and abdominal pain.  Genitourinary: Negative for difficulty urinating, dysuria and urgency.  Musculoskeletal: Positive for arthralgias and gait problem.  Skin: Positive for color change and wound.       Right forehead hematoma. Anterior right lowe leg about ankle a area about her hand size redness, warmth, swelling, superficial linear skin opens, painful when touched.   Neurological: Negative for dizziness, facial asymmetry, speech difficulty and weakness.       Memory lapses.   Psychiatric/Behavioral: Negative for agitation, behavioral problems, hallucinations and sleep disturbance. The patient is not nervous/anxious.     Immunization History  Administered Date(s) Administered  . Influenza Whole 10/19/2012  .  Influenza-Unspecified 10/18/2013, 10/17/2014, 09/02/2015, 09/29/2016, 10/03/2017  . PPD Test 01/22/2013, 11/28/2015  . Pneumococcal Conjugate-13 11/18/2017  . Pneumococcal Polysaccharide-23 01/22/2013  . Td 02/21/2009  . Tdap 04/07/2015   Pertinent  Health Maintenance Due  Topic Date Due  . DEXA SCAN  03/25/1978  . INFLUENZA VACCINE  10/12/2018 (Originally 07/13/2018)  . PNA vac Low Risk Adult  Completed   Fall Risk  09/05/2018 09/02/2017 11/14/2015 04/17/2015 09/26/2014  Falls in the past year? Yes No No Yes No  Number falls in past yr: 2 or more - - 1 -  Injury with Fall? Yes - - Yes -  Comment - - - laceration forehead 04/07/15 -  Risk Factor Category  - - - High Fall Risk -  Risk for fall due to : - - Impaired mobility;Impaired vision - -    Functional Status Survey:    Vitals:   09/07/18 1137  BP: 128/72  Pulse: 64  Resp: 18  Temp: 98.2 F (36.8 C)  SpO2: 95%  Weight: 153 lb 9.6 oz (69.7 kg)  Height: 5' 2"  (1.575 m)   Body mass index is 28.09 kg/m. Physical Exam  Constitutional: She appears well-developed and well-nourished.  HENT:  Head: Normocephalic and atraumatic.  Eyes: Pupils are equal, round, and reactive to light. EOM are normal.  Neck: Normal range of motion. Neck supple. No JVD present. No thyromegaly present.  Cardiovascular: Normal rate and regular rhythm.  No murmur heard. Pulmonary/Chest: Effort normal. She has no wheezes. She has no rales.  Abdominal: Soft. Bowel sounds are normal. She exhibits no distension. There is no tenderness. There is no rebound and no guarding.  Musculoskeletal: She exhibits edema.  Needs assistance for transfer. W/c for mobility. Edema BLE 1+, R>L.   Neurological: She is alert. No cranial nerve deficit. She exhibits normal muscle tone. Coordination normal.  Oriented to person and place.   Skin: Skin is warm and dry. There is erythema.  Right forehead hematoma. Anterior right lowe leg about ankle a area about her hand size redness, warmth, swelling, superficial linear skin opens, painful when touched.    Psychiatric: She has a normal mood and affect. Her behavior is normal.    Labs reviewed: Recent Labs    12/26/17 0449 12/26/17 0509 02/28/18 05/30/18 08/17/18 1539  NA 140 141 142 141 138  K 4.8 4.5 4.8 3.8 4.2  CL 104 101  --  106 100  CO2 27  --   --  30 26  GLUCOSE 108* 103*  --   --  112*  BUN 30* 34* 45* 37* 27*  CREATININE 1.00 1.00 1.0 0.9 0.98  CALCIUM 9.1  --   --  8.7 9.4   Recent Labs    12/26/17 0449 05/30/18 08/17/18 1539  AST 25 18 26   ALT 22 17 19   ALKPHOS 59 54 60  BILITOT 0.7  --  0.4  PROT 6.0*  --  6.4*  ALBUMIN 3.4* 3.4 3.4*   Recent Labs    12/26/17 0449  02/28/18 05/30/18 08/17/18 1539  WBC 8.2  --  7.6 6.5 8.8  NEUTROABS  4.5  --   --   --  6.3  HGB 14.9   < > 13.6 13.7 14.7  HCT 46.7*   < > 40 41 46.3*  MCV 93.4  --   --   --  92.2  PLT 181  --  186 171 185   < > = values in this interval not displayed.  Lab Results  Component Value Date   TSH 1.19 05/30/2018   No results found for: HGBA1C No results found for: CHOL, HDL, LDLCALC, LDLDIRECT, TRIG, CHOLHDL  Significant Diagnostic Results in last 30 days:  Ct Head Wo Contrast  Result Date: 08/17/2018 CLINICAL DATA:  84 year old female status post fall with neck pain, initial evaluation EXAM: CT HEAD WITHOUT CONTRAST CT CERVICAL SPINE WITHOUT CONTRAST TECHNIQUE: Multidetector CT imaging of the head and cervical spine was performed following the standard protocol without intravenous contrast. Multiplanar CT image reconstructions of the cervical spine were also generated. COMPARISON:  Most recent prior CT scan of the head and cervical spine 12/26/2017; prior head CT 06/19/2012 FINDINGS: CT HEAD FINDINGS Brain: No evidence of acute infarction, hemorrhage, hydrocephalus, extra-axial collection or mass lesion/mass effect. Vascular: No hyperdense vessel sign. Atherosclerotic calcifications in the bilateral cavernous carotid arteries. Skull: No acute fracture or lytic or blastic osseous lesion. Hyperostosis frontalis interna present bilaterally. Sinuses/Orbits: No acute finding. Other: Right forehead hematoma. Small 8 mm soft tissue nodule in the left parotid gland demonstrates no significant interval change compared to fairly remote prior imaging from 06/19/2012. Six year stability is highly suggestive of benignity. CT CERVICAL SPINE FINDINGS Alignment: Normal. Skull base and vertebrae: No acute fracture. No primary bone lesion or focal pathologic process. Soft tissues and spinal canal: No prevertebral fluid or swelling. No visible canal hematoma. Disc levels: Multilevel degenerative disc disease. Prominent calcified posterior disc osteophyte complex at C2-C3. Additional  levels of posterior disc osteophyte complex include C4-C5, C5-C6 and C6-C7. Ankylosis of the right C3-C4 facets. Upper chest: Negative. Other: None. IMPRESSION: CT HEAD 1. No acute intracranial abnormality. 2. Moderately large right forehead hematoma without evidence of underlying calvarial fracture. 3. Small left parotid nodule remains stable dating back to 06/19/2012. CT CSPINE 1. No acute fracture or malalignment. 2. Multilevel degenerative disc disease and facet arthropathy. Electronically Signed   By: Jacqulynn Cadet M.D.   On: 08/17/2018 16:19   Ct Cervical Spine Wo Contrast  Result Date: 08/17/2018 CLINICAL DATA:  84 year old female status post fall with neck pain, initial evaluation EXAM: CT HEAD WITHOUT CONTRAST CT CERVICAL SPINE WITHOUT CONTRAST TECHNIQUE: Multidetector CT imaging of the head and cervical spine was performed following the standard protocol without intravenous contrast. Multiplanar CT image reconstructions of the cervical spine were also generated. COMPARISON:  Most recent prior CT scan of the head and cervical spine 12/26/2017; prior head CT 06/19/2012 FINDINGS: CT HEAD FINDINGS Brain: No evidence of acute infarction, hemorrhage, hydrocephalus, extra-axial collection or mass lesion/mass effect. Vascular: No hyperdense vessel sign. Atherosclerotic calcifications in the bilateral cavernous carotid arteries. Skull: No acute fracture or lytic or blastic osseous lesion. Hyperostosis frontalis interna present bilaterally. Sinuses/Orbits: No acute finding. Other: Right forehead hematoma. Small 8 mm soft tissue nodule in the left parotid gland demonstrates no significant interval change compared to fairly remote prior imaging from 06/19/2012. Six year stability is highly suggestive of benignity. CT CERVICAL SPINE FINDINGS Alignment: Normal. Skull base and vertebrae: No acute fracture. No primary bone lesion or focal pathologic process. Soft tissues and spinal canal: No prevertebral fluid or  swelling. No visible canal hematoma. Disc levels: Multilevel degenerative disc disease. Prominent calcified posterior disc osteophyte complex at C2-C3. Additional levels of posterior disc osteophyte complex include C4-C5, C5-C6 and C6-C7. Ankylosis of the right C3-C4 facets. Upper chest: Negative. Other: None. IMPRESSION: CT HEAD 1. No acute intracranial abnormality. 2. Moderately large right forehead hematoma without evidence of underlying calvarial fracture. 3. Small left  parotid nodule remains stable dating back to 06/19/2012. CT CSPINE 1. No acute fracture or malalignment. 2. Multilevel degenerative disc disease and facet arthropathy. Electronically Signed   By: Jacqulynn Cadet M.D.   On: 08/17/2018 16:19    Assessment/Plan Cellulitis and abscess of right leg right lower leg swelling, redness, warmth, superficial skin opens in linear fashion, and c/o painful when touched for x1 day. Will Doxycycline 155m bid x 10days. Apply Bactroban daily to affected area x 10 days. Observe.   Edema Hx of edema BLE, continue  Furosemide 226mqd, she denied chest pain/pressure, palpitation, cough, sputum production, or SOB. Weights #[id number] 08/13/18, #154 Ibs 04/12/18. Will monitor weight closely, 2 x/week. Observe.   Arthralgia OA pain is managed, continue  Tylenol 65013mhs and prn.     Family/ staff Communication: plan of care reviewed with the patient and charge nurse.   Labs/tests ordered: none  Time spend 25 minutes.

## 2018-09-05 NOTE — Progress Notes (Signed)
Subjective:   [name] is a 84 y.o. female who presents for Medicare Annual (Subsequent) preventive examination at [facility]-09/02/2017    Objective:     Vitals: BP 132/74 (BP Location: Left Arm, Patient Position: Sitting)   Pulse 78   Temp 97.9 F (36.6 C) (Oral)   Ht _0  (1.575 m)   Wt 153 lb (69.4 kg)   BMI 27.98 kg/m   Body mass index is 27.98 kg/m.  Advanced Directives 09/05/2018 08/17/2018 07/17/2018 05/26/2018 02/21/2018 12/08/2017 12/08/2017  Does Patient Have a Medical Advance Directive? _1  Yes Yes  Type of Advance Directive Out of facility DNR (pink MOST or yellow form) Out of facility DNR (pink MOST or yellow form) [place];Living will;Out of facility DNR (pink MOST or yellow form) [place];Living will;Out of facility DNR (pink MOST or yellow form) [place];Living will;Out of facility DNR (pink MOST or yellow form) [place];Living will;Out of facility DNR (pink MOST or yellow form) [place];Living will;Out of facility DNR (pink MOST or yellow form)  Does patient want to make changes to medical advance directive? No - Patient declined - No - Patient declined No - Patient declined No - Patient declined - No - Patient declined  Copy of [place] in Chart? Yes - _2   Would patient like information on creating a medical advance directive? No - Patient declined No - Patient declined - - - - -  Pre-existing out of facility DNR order (yellow form or pink MOST form) Yellow form placed in chart (order not valid for inpatient use);Pink MOST form placed in chart (order not valid for inpatient use) - Yellow form placed in chart (order not valid for inpatient use);Pink MOST form placed in chart (order not valid for inpatient use) Yellow form placed in chart (order not valid for inpatient use) Yellow  form placed in chart (order not valid for inpatient use) Yellow form placed in chart (order not valid for inpatient use) Yellow form placed in chart (order not valid for inpatient use)    Tobacco Social History   Tobacco Use  Smoking Status Never Smoker  Smokeless Tobacco Never Used     Counseling given: Not Answered   Clinical Intake:  Pre-visit preparation completed: No  Pain : No/denies pain     Diabetes: No  How often do you need to have someone help you when you read instructions, pamphlets, or other written materials from your doctor or pharmacy?: 3 - Sometimes  Interpreter Needed?: No  Information entered by :: Tyson Dense, RN  Past Medical History:  Diagnosis Date  . Abnormal liver function tests    11/11/15 AST 31, ALT 88, alk phos 96   . Acute kidney failure, unspecified ([place]) 01/24/2012  . Arthralgia 09/26/2014   Multiple joints: knees, shoulders, wrists, spine hips   . Arthritis   . Candidiasis of other urogenital sites 08/10/2012  . Cerebral embolism 05/23/2005  . CHF (congestive heart failure) ([place])   . Cholelithiasis 11/04/2015  . Closed fracture of sacrum and coccyx without mention of spinal cord injury 02/14/2012  . Constipation 05/03/2013  . Contusion of face, scalp, and neck except eye(s) 06/01/2012  . Contusion of wrist 06/01/2012  . COPD (chronic obstructive pulmonary disease) ([place])   . COPD, mild ([place]) 01/20/2013  . Edema 04/20/2012  . H/O: CVA (cerebrovascular  accident)   . Hearing loss 09/26/2014  . History of cancer of uterus   . HTN (hypertension), benign   . Hyperglycemia 11/14/2015  . Hypothyroidism   . Insomnia, unspecified 08/10/2012  . Open wound of knee, leg (except thigh), and ankle, without mention of complication 3/0/1601  . Other and unspecified hyperlipidemia 06/01/2012  . Other disorder of coccyx 01/27/2012  . Pain in joint, ankle and foot 06/14/2012  . Peripheral vascular disease, unspecified ([place]) 01/27/2012  . Personal history of  fall 01/27/2012  . Pneumonia, organism unspecified(486) 01/23/2005  . Rheumatic fever 09/26/1933   Age 34 10/01/14 ESR 13, RAF <10   . Seasonal allergies 05/03/2013  . Stasis dermatitis of both legs 09/26/2014  . Unspecified constipation 11/02/2012  . Unspecified hearing loss 02/08/2013  . Unspecified hereditary and idiopathic peripheral neuropathy 01/27/2012  . Urinary frequency 02/24/2014  . Ventricular fibrillation ([place]) 01/24/2012   Past Surgical History:  Procedure Laterality Date  . ABDOMINAL HYSTERECTOMY  1990   for endometrial cancer  . CATARACT EXTRACTION W/ INTRAOCULAR LENS  IMPLANT, BILATERAL    . [place]  . GUM SURGERY  1932  . MASTOIDECTOMY  1920   bilateral  . THYROIDECTOMY  1960  . TONSILLECTOMY  1916   Family History  Problem Relation Age of Onset  . Stroke Mother   . Heart disease Father    Social History   Socioeconomic History  . Marital status: Widowed    Spouse name: Not on file  . Number of children: Not on file  . Years of education: Not on file  . Highest education level: Not on file  Occupational History  . Occupation: retired Tour manager  . Financial resource strain: Not hard at all  . Food insecurity:    Worry: Never true    Inability: Never true  . Transportation needs:    Medical: No    Non-medical: No  Tobacco Use  . Smoking status: Never Smoker  . Smokeless tobacco: Never Used  Substance and Sexual Activity  . Alcohol use: No  . Drug use: No  . Sexual activity: Never  Lifestyle  . Physical activity:    Days per week: 0 days    Minutes per session: 0 min  . Stress: To some extent  Relationships  . Social connections:    Talks on phone: Not on file    Gets together: Not on file    Attends religious service: Never    Active member of club or organization: No    Attends meetings of clubs or organizations: Never    Relationship status: Widowed  Other Topics Concern  . Not on file  Social History  Narrative   Lives at [facility] since2/07/2009   Widowed   Never smoked   Alcohol none   Exercise chair exercise   Walks with walker   DNR, POA, Living Will    Outpatient Encounter Medications as of 09/05/2018  Medication Sig  . acetaminophen (TYLENOL) 325 MG tablet Take 650 mg by mouth at bedtime.   Marland Kitchen acetaminophen (TYLENOL) 500 MG tablet Take 2 tablets (1,000 mg total) by mouth every 6 (six) hours as needed.  Marland Kitchen alum & mag hydroxide-simeth (MAALOX/MYLANTA) 200-200-20 MG/5ML suspension Take 30 mLs by mouth every 6 (six) hours as needed for indigestion or heartburn.  Marland Kitchen aspirin 81 MG chewable tablet Chew 81 mg by mouth daily.  . B Complex-C (B-COMPLEX WITH VITAMIN C) tablet Take 1 tablet  by mouth daily.  . calcium citrate-vitamin D (CITRACAL+D) 315-200 MG-UNIT per tablet Take 1 tablet by mouth daily.  . carboxymethylcellulose (REFRESH TEARS) 0.5 % SOLN Place 1 drop into both eyes 2 (two) times daily.  Marland Kitchen docusate sodium (COLACE) 100 MG capsule Take 200 mg by mouth daily.   . fexofenadine (ALLEGRA) 60 MG tablet Take 60 mg by mouth daily.  . furosemide (LASIX) 20 MG tablet Take 1 tablet by mouth daily.  Marland Kitchen ipratropium-albuterol (DUONEB) 0.5-2.5 (3) MG/3ML SOLN Take 3 mLs by nebulization 2 (two) times daily.   Marland Kitchen levothyroxine (SYNTHROID, LEVOTHROID) 150 MCG tablet Take 150 mcg by mouth daily.   . magnesium hydroxide (MILK OF MAGNESIA) 400 MG/5ML suspension Take 30 mLs by mouth daily as needed for mild constipation.  . Melatonin 3 MG TABS Take 3 mg by mouth at bedtime.   . Multiple Vitamin (MULTIVITAMIN WITH MINERALS) TABS tablet Take 1 tablet by mouth daily.  . potassium chloride (K-DUR,KLOR-CON) 10 MEQ tablet Take 10 mEq by mouth daily.    No facility-administered encounter medications on file as of 09/05/2018.     Activities of Daily Living In your present state of health, do you have any difficulty performing the following activities: 09/05/2018  Hearing? Y  Vision? Y    Difficulty concentrating or making decisions? Y  Walking or climbing stairs? Y  Dressing or bathing? N  Doing errands, shopping? Y  Preparing Food and eating ? Y  Using the Toilet? N  In the past six months, have you accidently leaked urine? Y  Do you have problems with loss of bowel control? Y  Managing your Medications? Y  Managing your Finances? Y  Housekeeping or managing your Housekeeping? Y  Some recent data might be hidden    Patient Care Team: Blanchie Serve, MD as PCP - General (Internal Medicine) Nyoka Cowden Viviann Spare, MD (Internal Medicine) [place], Man X, NP as Nurse Practitioner (Nurse Practitioner) [place], Man X, NP as Nurse Practitioner (Nurse Practitioner)    Assessment:   This is a routine wellness examination for [place].  Exercise Activities and Dietary recommendations Current Exercise Habits: The patient does not participate in regular exercise at present, Exercise limited by: orthopedic condition(s)  Goals   None     Fall Risk Fall Risk  09/05/2018 09/02/2017 11/14/2015 04/17/2015 09/26/2014  Falls in the past year? Yes No No Yes No  Number falls in past yr: 2 or more - - 1 -  Injury with Fall? Yes - - Yes -  Comment - - - laceration forehead 04/07/15 -  Risk Factor Category  - - - High Fall Risk -  Risk for fall due to : - - Impaired mobility;Impaired vision - -   Is the patient's home free of loose throw rugs in walkways, pet beds, electrical cords, etc?   yes      Grab bars in the bathroom? yes      Handrails on the stairs?   yes      Adequate lighting?   yes  Depression Screen PHQ 2/9 Scores 09/05/2018 09/02/2017 11/14/2015 09/26/2014  PHQ - 2 Score 0 0 0 0     Cognitive Function MMSE - Mini Mental State Exam 09/05/2018  Not completed: Refused     6CIT Screen 09/02/2017  What Year? 0 points  What month? 0 points  What time? 0 points  Count back from 20 0 points  Months in reverse 0 points  Repeat phrase  2 points  Total Score 2    Immunization History  Administered Date(s) Administered  . Influenza Whole 10/19/2012  . Influenza-Unspecified 10/18/2013, 10/17/2014, 09/02/2015, 09/29/2016, 10/03/2017  . PPD Test 01/22/2013, 11/28/2015  . Pneumococcal Conjugate-13 11/18/2017  . Pneumococcal Polysaccharide-23 01/22/2013  . Td 02/21/2009  . Tdap 04/07/2015    Qualifies for Shingles Vaccine? Not in past records  Screening Tests Health Maintenance  Topic Date Due  . DEXA SCAN  03/25/1978  . INFLUENZA VACCINE  07/13/2018  . TETANUS/TDAP  04/06/2025  . PNA vac Low Risk Adult  Completed    Cancer Screenings: Lung: Low Dose CT Chest recommended if Age 107-80 years, 30 pack-year currently smoking OR have quit w/in 15years. Patient does not qualify. Breast:  Up to date on Mammogram? Excluded due to age Up to date of Bone Density/Dexa? Excluded due to age Colorectal: excluded due to age  Additional Screenings:  Hepatitis C Screening: declined Flu vaccine due: will receive at [facility]:  I have personally reviewed and addressed the Medicare Annual Wellness questionnaire and have noted the following in the patient's chart:  A. Medical and social history B. Use of alcohol, tobacco or illicit drugs  C. Current medications and supplements D. Functional ability and status E.  Nutritional status F.  Physical activity G. Advance directives H. List of other physicians I.  Hospitalizations, surgeries, and ER visits in previous 12 months J.  [place] to include hearing, vision, cognitive, depression L. Referrals and appointments - none  In addition, I have reviewed and discussed with patient certain preventive protocols, quality metrics, and best practice recommendations. A written personalized care plan for preventive services as well as general preventive health recommendations were provided to patient.     See attached scanned questionnaire for additional information.    Signed,   Tyson Dense, RN Nurse Health Advisor  Patient Concerns: None

## 2018-09-06 DIAGNOSIS — L03115 Cellulitis of right lower limb: Principal | ICD-10-CM

## 2018-09-06 DIAGNOSIS — L02415 Cutaneous abscess of right lower limb: Secondary | ICD-10-CM | POA: Insufficient documentation

## 2018-09-06 NOTE — Assessment & Plan Note (Signed)
OA pain is managed, continue  Tylenol 650mg  qhs and prn.

## 2018-09-06 NOTE — Assessment & Plan Note (Signed)
right lower leg swelling, redness, warmth, superficial skin opens in linear fashion, and c/o painful when touched for x1 day. Will Doxycycline 100mg  bid x 10days. Apply Bactroban daily to affected area x 10 days. Observe.

## 2018-09-06 NOTE — Assessment & Plan Note (Signed)
Hx of edema BLE, continue  Furosemide 20mg  qd, she denied chest pain/pressure, palpitation, cough, sputum production, or SOB. Weights #[id number] 08/13/18, #154 Ibs 04/12/18. Will monitor weight closely, 2 x/week. Observe.

## 2018-09-07 ENCOUNTER — Non-Acute Institutional Stay: Payer: Medicare Other | Admitting: Internal Medicine

## 2018-09-07 ENCOUNTER — Encounter: Payer: Self-pay | Admitting: Nurse Practitioner

## 2018-09-07 ENCOUNTER — Encounter: Payer: Self-pay | Admitting: Internal Medicine

## 2018-09-07 DIAGNOSIS — K5901 Slow transit constipation: Secondary | ICD-10-CM

## 2018-09-07 DIAGNOSIS — J449 Chronic obstructive pulmonary disease, unspecified: Secondary | ICD-10-CM

## 2018-09-07 DIAGNOSIS — I10 Essential (primary) hypertension: Secondary | ICD-10-CM

## 2018-09-07 DIAGNOSIS — L03115 Cellulitis of right lower limb: Secondary | ICD-10-CM

## 2018-09-07 DIAGNOSIS — E039 Hypothyroidism, unspecified: Secondary | ICD-10-CM

## 2018-09-07 DIAGNOSIS — M79604 Pain in right leg: Secondary | ICD-10-CM | POA: Diagnosis not present

## 2018-09-07 NOTE — Progress Notes (Signed)
Location:  [facility] Number: [place] 923 Place of Service:  ALF [phone]) Provider:  Blanchie Serve MD  Blanchie Serve, MD  Patient Care Team: Blanchie Serve, MD as PCP - General (Internal Medicine) [name], MD (Internal Medicine) [place], Man X, NP as Nurse Practitioner (Nurse Practitioner) [place], Man X, NP as Nurse Practitioner (Nurse Practitioner)  Extended Emergency Contact Information Primary Emergency Contact: [name] [place] of [place] Phone: [phone] Relation: Son Secondary Emergency Contact: [place] Phone: [phone] Mobile Phone: [phone] Relation: Daughter  Code Status:  DNR  Goals of care: Advanced Directive information Advanced Directives 09/07/2018  Does Patient Have a Medical Advance Directive? Yes  Type of Paramedic of [place];Living will;Out of facility DNR (pink MOST or yellow form)  Does patient want to make changes to medical advance directive? -  Copy of [place] in Chart? Yes  Would patient like information on creating a medical advance directive? -  Pre-existing out of facility DNR order (yellow form or pink MOST form) Yellow form placed in chart (order not valid for inpatient use)     Chief Complaint  Patient presents with  . Medical Management of Chronic Issues    Resident is being seen for a routine visit.     HPI:  Pt is a 84 y.o. female seen today for medical management of chronic diseases. She is very hard of hearing. She complaints of pain to right leg. Denies any other acute concern. She is currently on antibiotic for cellulitis to RLE. She is afebrile per nursing. She gets around in her wheelchair. She went to beauty shop this am for her hair. She had a fall on 08/17/18 and was seen in the ED for head and neck pain. CT head and C-spine were obtained showing right forehead hematoma with  DJD and ankylosis. She was sent back to her ALF. No further fall reported. Her breathing has been stable. She takes her breathing treatment and allegra. She is sleeping well at night. She is taking low dose diuretic and tolerating levothyroxine well.    Past Medical History:  Diagnosis Date  . Abnormal liver function tests    11/11/15 AST 31, ALT 88, alk phos 96   . Acute kidney failure, unspecified ([place]) 01/24/2012  . Arthralgia 09/26/2014   Multiple joints: knees, shoulders, wrists, spine hips   . Arthritis   . Candidiasis of other urogenital sites 08/10/2012  . Cerebral embolism 05/23/2005  . CHF (congestive heart failure) ([place])   . Cholelithiasis 11/04/2015  . Closed fracture of sacrum and coccyx without mention of spinal cord injury 02/14/2012  . Constipation 05/03/2013  . Contusion of face, scalp, and neck except eye(s) 06/01/2012  . Contusion of wrist 06/01/2012  . COPD (chronic obstructive pulmonary disease) ([place])   . COPD, mild ([place]) 01/20/2013  . Edema 04/20/2012  . H/O: CVA (cerebrovascular accident)   . Hearing loss 09/26/2014  . History of cancer of uterus   . HTN (hypertension), benign   . Hyperglycemia 11/14/2015  . Hypothyroidism   . Insomnia, unspecified 08/10/2012  . Open wound of knee, leg (except thigh), and ankle, without mention of complication 0/08/6282  . Other and unspecified hyperlipidemia 06/01/2012  . Other disorder of coccyx 01/27/2012  . Pain in joint, ankle and foot 06/14/2012  . Peripheral vascular disease, unspecified ([place]) 01/27/2012  . Personal history of fall 01/27/2012  . Pneumonia, organism unspecified(486) 01/23/2005  .  Rheumatic fever 09/26/1933   Age 17 10/01/14 ESR 13, RAF <10   . Seasonal allergies 05/03/2013  . Stasis dermatitis of both legs 09/26/2014  . Unspecified constipation 11/02/2012  . Unspecified hearing loss 02/08/2013  . Unspecified hereditary and idiopathic peripheral neuropathy 01/27/2012  . Urinary frequency 02/24/2014  . Ventricular  fibrillation ([place]) 01/24/2012   Past Surgical History:  Procedure Laterality Date  . ABDOMINAL HYSTERECTOMY  1990   for endometrial cancer  . CATARACT EXTRACTION W/ INTRAOCULAR LENS  IMPLANT, BILATERAL    . [place]  . GUM SURGERY  1932  . MASTOIDECTOMY  1920   bilateral  . THYROIDECTOMY  1960  . TONSILLECTOMY  1916    Allergies  Allergen Reactions  . Amoxicillin     Unknown, patient unable to answer questionnaire   . Aspirin Other (See Comments)    On MAR  . Avelox [Moxifloxacin]     Unknown: listed on MAR  . Erythromycin     Unknown: listed on MAR  . Monistat [Miconazole]     Unknown: listed on MAR  . Morphine And Related     Unknown: listed on MAR  . Orange Juice [Orange Oil]     Unknown: listed on [facility]    Outpatient Encounter Medications as of 09/07/2018  Medication Sig  . acetaminophen (TYLENOL) 325 MG tablet Take 650 mg by mouth at bedtime.   Marland Kitchen acetaminophen (TYLENOL) 500 MG tablet Take 2 tablets (1,000 mg total) by mouth every 6 (six) hours as needed.  Marland Kitchen alum & mag hydroxide-simeth (MAALOX/MYLANTA) 200-200-20 MG/5ML suspension Take 30 mLs by mouth every 6 (six) hours as needed for indigestion or heartburn.  Marland Kitchen aspirin 81 MG chewable tablet Chew 81 mg by mouth daily.  . B Complex-C (B-COMPLEX WITH VITAMIN C) tablet Take 1 tablet by mouth daily.  . calcium citrate-vitamin D (CITRACAL+D) 315-200 MG-UNIT per tablet Take 1 tablet by mouth daily.  . carboxymethylcellulose (REFRESH TEARS) 0.5 % SOLN Place 1 drop into both eyes 2 (two) times daily.  Marland Kitchen docusate sodium (COLACE) 100 MG capsule Take 200 mg by mouth daily.   Marland Kitchen doxycycline (VIBRAMYCIN) 100 MG capsule Take 100 mg by mouth 2 (two) times daily.  . fexofenadine (ALLEGRA) 60 MG tablet Take 60 mg by mouth daily.  . furosemide (LASIX) 20 MG tablet Take 1 tablet by mouth daily.  Marland Kitchen ipratropium-albuterol (DUONEB) 0.5-2.5 (3) MG/3ML SOLN Take 3 mLs by nebulization 2 (two) times daily.   Marland Kitchen levothyroxine  (SYNTHROID, LEVOTHROID) 150 MCG tablet Take 150 mcg by mouth daily.   . magnesium hydroxide (MILK OF MAGNESIA) 400 MG/5ML suspension Take 30 mLs by mouth daily as needed for mild constipation.  . Melatonin 3 MG TABS Take 3 mg by mouth at bedtime.   . Multiple Vitamin (MULTIVITAMIN WITH MINERALS) TABS tablet Take 1 tablet by mouth daily.  . mupirocin ointment (BACTROBAN) 2 % Apply to anterior 1/3 lower right leg reddened area daily for 10 days.  . potassium chloride (K-DUR,KLOR-CON) 10 MEQ tablet Take 10 mEq by mouth daily.   Marland Kitchen saccharomyces boulardii (FLORASTOR) 250 MG capsule Take 250 mg by mouth 2 (two) times daily.   No facility-administered encounter medications on file as of 09/07/2018.     Review of Systems  Reason unable to perform ROS: limited with cognitive impairment and hearing impairment.  Constitutional: Negative for appetite change, chills and fever.  HENT: Positive for hearing loss. Negative for congestion, rhinorrhea and trouble swallowing.   Eyes: Positive  for visual disturbance.  Respiratory: Negative for cough and shortness of breath.   Cardiovascular: Positive for leg swelling. Negative for chest pain and palpitations.  Gastrointestinal: Negative for abdominal pain, diarrhea and vomiting.       Had a bowel movement today  Genitourinary: Negative for dysuria and hematuria.       Has episodes of urinary incontinence  Musculoskeletal: Positive for arthralgias and gait problem.  Neurological: Negative for dizziness and headaches.  Hematological: Bruises/bleeds easily.  Psychiatric/Behavioral: Positive for confusion. Negative for behavioral problems.    Immunization History  Administered Date(s) Administered  . Influenza Whole 10/19/2012  . Influenza-Unspecified 10/18/2013, 10/17/2014, 09/02/2015, 09/29/2016, 10/03/2017  . PPD Test 01/22/2013, 11/28/2015  . Pneumococcal Conjugate-13 11/18/2017  . Pneumococcal Polysaccharide-23 01/22/2013  . Td 02/21/2009  . Tdap  04/07/2015   Pertinent  Health Maintenance Due  Topic Date Due  . DEXA SCAN  03/25/1978  . INFLUENZA VACCINE  10/12/2018 (Originally 07/13/2018)  . PNA vac Low Risk Adult  Completed   Fall Risk  09/05/2018 09/02/2017 11/14/2015 04/17/2015 09/26/2014  Falls in the past year? Yes No No Yes No  Number falls in past yr: 2 or more - - 1 -  Injury with Fall? Yes - - Yes -  Comment - - - laceration forehead 04/07/15 -  Risk Factor Category  - - - High Fall Risk -  Risk for fall due to : - - Impaired mobility;Impaired vision - -   Functional Status Survey:    Vitals:   09/07/18 1106  BP: 128/72  Pulse: 64  Resp: 18  Temp: 98.2 F (36.8 C)  TempSrc: Oral  SpO2: 95%  Weight: 156 lb 6.4 oz (70.9 kg)  Height: '5\' 2"'$  (1.575 m)   Body mass index is 28.61 kg/m.   Wt Readings from Last 3 Encounters:  09/07/18 156 lb 6.4 oz (70.9 kg)  09/05/18 153 lb (69.4 kg)  09/07/18 153 lb 9.6 oz (69.7 kg)   Physical Exam  Constitutional:  Overweight elderly female in no distress  HENT:  Head: Normocephalic and atraumatic.  Mouth/Throat: Oropharynx is clear and moist. No oropharyngeal exudate.  Eyes: Pupils are equal, round, and reactive to light. Conjunctivae and EOM are normal. Right eye exhibits no discharge. Left eye exhibits no discharge.  Neck: Normal range of motion. Neck supple.  Cardiovascular: Normal rate and regular rhythm.  Pulmonary/Chest: Effort normal. She has no wheezes. She has no rales. She exhibits no tenderness.  Poor air movement to lung bases  Abdominal: Soft. Bowel sounds are normal. There is no tenderness. There is no guarding.  Musculoskeletal: She exhibits edema.  Pitting edema to feet and ankle area, erythema to skin of lower leg just above the ankle, normal temperature but tender to touch, no drainage, can move all 4 extremities, unsteady gait and uses wheelchair, needs assistance with transfer  Lymphadenopathy:    She has no cervical adenopathy.  Neurological: She is  alert. She exhibits normal muscle tone.  Oriented to self, place and month.   Skin: Skin is warm and dry. She is not diaphoretic. There is erythema.  Right forehead hematoma, resolving bruise to her anterior neck and under right orbit. Erythema to right lower leg with few liner marks with opening, no drainage  Psychiatric: She has a normal mood and affect.    Labs reviewed: Recent Labs    12/26/17 0449 12/26/17 0509 02/28/18 05/30/18 08/17/18 1539  NA 140 141 142 141 138  K 4.8 4.5 4.8 3.8 4.2  CL 104 101  --  106 100  CO2 27  --   --  30 26  GLUCOSE 108* 103*  --   --  112*  BUN 30* 34* 45* 37* 27*  CREATININE 1.00 1.00 1.0 0.9 0.98  CALCIUM 9.1  --   --  8.7 9.4   Recent Labs    12/26/17 0449 05/30/18 08/17/18 1539  AST '25 18 26  '$ ALT '22 17 19  '$ ALKPHOS 59 54 60  BILITOT 0.7  --  0.4  PROT 6.0*  --  6.4*  ALBUMIN 3.4* 3.4 3.4*   Recent Labs    12/26/17 0449  02/28/18 05/30/18 08/17/18 1539  WBC 8.2  --  7.6 6.5 8.8  NEUTROABS 4.5  --   --   --  6.3  HGB 14.9   < > 13.6 13.7 14.7  HCT 46.7*   < > 40 41 46.3*  MCV 93.4  --   --   --  92.2  PLT 181  --  186 171 185   < > = values in this interval not displayed.   Lab Results  Component Value Date   TSH 1.19 05/30/2018   No results found for: HGBA1C No results found for: CHOL, HDL, LDLCALC, LDLDIRECT, TRIG, CHOLHDL  Significant Diagnostic Results in last 30 days:  Ct Head Wo Contrast  Result Date: 08/17/2018 CLINICAL DATA:  84 year old female status post fall with neck pain, initial evaluation EXAM: CT HEAD WITHOUT CONTRAST CT CERVICAL SPINE WITHOUT CONTRAST TECHNIQUE: Multidetector CT imaging of the head and cervical spine was performed following the standard protocol without intravenous contrast. Multiplanar CT image reconstructions of the cervical spine were also generated. COMPARISON:  Most recent prior CT scan of the head and cervical spine 12/26/2017; prior head CT 06/19/2012 FINDINGS: CT HEAD FINDINGS Brain:  No evidence of acute infarction, hemorrhage, hydrocephalus, extra-axial collection or mass lesion/mass effect. Vascular: No hyperdense vessel sign. Atherosclerotic calcifications in the bilateral cavernous carotid arteries. Skull: No acute fracture or lytic or blastic osseous lesion. Hyperostosis frontalis interna present bilaterally. Sinuses/Orbits: No acute finding. Other: Right forehead hematoma. Small 8 mm soft tissue nodule in the left parotid gland demonstrates no significant interval change compared to fairly remote prior imaging from 06/19/2012. Six year stability is highly suggestive of benignity. CT CERVICAL SPINE FINDINGS Alignment: Normal. Skull base and vertebrae: No acute fracture. No primary bone lesion or focal pathologic process. Soft tissues and spinal canal: No prevertebral fluid or swelling. No visible canal hematoma. Disc levels: Multilevel degenerative disc disease. Prominent calcified posterior disc osteophyte complex at C2-C3. Additional levels of posterior disc osteophyte complex include C4-C5, C5-C6 and C6-C7. Ankylosis of the right C3-C4 facets. Upper chest: Negative. Other: None. IMPRESSION: CT HEAD 1. No acute intracranial abnormality. 2. Moderately large right forehead hematoma without evidence of underlying calvarial fracture. 3. Small left parotid nodule remains stable dating back to 06/19/2012. CT CSPINE 1. No acute fracture or malalignment. 2. Multilevel degenerative disc disease and facet arthropathy. Electronically Signed   By: Jacqulynn Cadet M.D.   On: 08/17/2018 16:19   Ct Cervical Spine Wo Contrast  Result Date: 08/17/2018 CLINICAL DATA:  84 year old female status post fall with neck pain, initial evaluation EXAM: CT HEAD WITHOUT CONTRAST CT CERVICAL SPINE WITHOUT CONTRAST TECHNIQUE: Multidetector CT imaging of the head and cervical spine was performed following the standard protocol without intravenous contrast. Multiplanar CT image reconstructions of the cervical spine  were also generated. COMPARISON:  Most recent prior CT scan  of the head and cervical spine 12/26/2017; prior head CT 06/19/2012 FINDINGS: CT HEAD FINDINGS Brain: No evidence of acute infarction, hemorrhage, hydrocephalus, extra-axial collection or mass lesion/mass effect. Vascular: No hyperdense vessel sign. Atherosclerotic calcifications in the bilateral cavernous carotid arteries. Skull: No acute fracture or lytic or blastic osseous lesion. Hyperostosis frontalis interna present bilaterally. Sinuses/Orbits: No acute finding. Other: Right forehead hematoma. Small 8 mm soft tissue nodule in the left parotid gland demonstrates no significant interval change compared to fairly remote prior imaging from 06/19/2012. Six year stability is highly suggestive of benignity. CT CERVICAL SPINE FINDINGS Alignment: Normal. Skull base and vertebrae: No acute fracture. No primary bone lesion or focal pathologic process. Soft tissues and spinal canal: No prevertebral fluid or swelling. No visible canal hematoma. Disc levels: Multilevel degenerative disc disease. Prominent calcified posterior disc osteophyte complex at C2-C3. Additional levels of posterior disc osteophyte complex include C4-C5, C5-C6 and C6-C7. Ankylosis of the right C3-C4 facets. Upper chest: Negative. Other: None. IMPRESSION: CT HEAD 1. No acute intracranial abnormality. 2. Moderately large right forehead hematoma without evidence of underlying calvarial fracture. 3. Small left parotid nodule remains stable dating back to 06/19/2012. CT CSPINE 1. No acute fracture or malalignment. 2. Multilevel degenerative disc disease and facet arthropathy. Electronically Signed   By: Jacqulynn Cadet M.D.   On: 08/17/2018 16:19    Assessment/Plan  1. Cellulitis of right leg Continue and complete doxycycline 100 mg q12h 10 days course, check cbc with diff, skin care. Advised to keep legs elevated at rest.   2. Right leg pain Currently on tylenol 650 mg qhs and 1000 mg  q6h prn pain. D/c current order. Will have her on tylenol 1000 mg bid and 500 mg q 8h prn but not to exceed 3 g in 24 hrs.   3. Essential hypertension Continue furosemide and kcl, reviewed bmp, continue aspirin, SBP 120-140.  4. COPD, mild (HCC) Continue duoneb bid and monitor. Assistance with ADLs as needed.   5. Hypothyroidism, unspecified type Lab Results  Component Value Date   TSH 1.19 05/30/2018   Check TSH. continue levothyroxine 150 mcg daily  6. Slow transit constipation Maintain hydration, continue colace 200 mg daily, monitor for loose stool   Family/ staff Communication: reviewed care plan with patient and charge nurse.    Labs/tests ordered:  Cbc with diff, TSH 09/12/18   Blanchie Serve, MD Internal Medicine [facility] [address] [place], [place] [address] Cell Phone (Monday-Friday 8 am - 5 pm): [phone] On Call: [phone] and follow prompts after 5 pm and on weekends Office Phone: [phone] Office Fax: [phone]

## 2018-09-12 LAB — CBC AND DIFFERENTIAL
HEMATOCRIT: 41 (ref 36–46)
Hemoglobin: 13.5 (ref 12.0–16.0)
PLATELETS: 198 (ref 150–399)
WBC: 7.7

## 2018-09-12 LAB — TSH: TSH: 1.54 (ref <=5.90)

## 2018-09-13 ENCOUNTER — Other Ambulatory Visit: Payer: Self-pay | Admitting: *Deleted

## 2018-09-20 ENCOUNTER — Non-Acute Institutional Stay: Payer: Medicare Other | Admitting: Nurse Practitioner

## 2018-09-20 DIAGNOSIS — R2681 Unsteadiness on feet: Secondary | ICD-10-CM | POA: Diagnosis not present

## 2018-09-20 DIAGNOSIS — S80212A Abrasion, left knee, initial encounter: Secondary | ICD-10-CM

## 2018-09-20 DIAGNOSIS — R410 Disorientation, unspecified: Secondary | ICD-10-CM | POA: Diagnosis not present

## 2018-09-20 DIAGNOSIS — S0083XD Contusion of other part of head, subsequent encounter: Secondary | ICD-10-CM

## 2018-09-20 DIAGNOSIS — W19XXXA Unspecified fall, initial encounter: Secondary | ICD-10-CM | POA: Diagnosis not present

## 2018-09-20 NOTE — Assessment & Plan Note (Signed)
increased confusion, falling 09/19/18 when she stepped  Backward when she was  redirected to go back to her room after pulled down her brief standing in the hallway by her room with walker.will obtain CBC/diff, CMP, VS q shift x 72 hours.

## 2018-09-20 NOTE — Progress Notes (Signed)
Location:  [facility] Room Number: 921 Place of Service:  ALF (13) Provider:  Marlana Latus  NP  No primary care provider on file.  Patient Care Team: [name], MD (Internal Medicine) [place], [facility] Yanuel Tagg X, NP as Nurse Practitioner (Nurse Practitioner) Kathleen Argue, [facility] Annelle Behrendt X, NP as Nurse Practitioner (Nurse Practitioner)  Extended Emergency Contact Information Primary Emergency Contact: [name] [place] of [place] Phone: [phone] Relation: Son Secondary Emergency Contact: [place] Phone: [phone] Mobile Phone: [phone] Relation: Daughter  Code Status:  DNR Goals of care: Advanced Directive information Advanced Directives 09/20/2018  Does Patient Have a Medical Advance Directive? Yes  Type of Paramedic of [place];Living will;Out of facility DNR (pink MOST or yellow form)  Does patient want to make changes to medical advance directive? No - Patient declined  Copy of [place] in Chart? Yes  Would patient like information on creating a medical advance directive? -  Pre-existing out of facility DNR order (yellow form or pink MOST form) Yellow form placed in chart (order not valid for inpatient use)     Chief Complaint  Patient presents with  . Acute Visit    [place] on10/8/19    HPI:  Pt is a 84 y.o. female seen today for an acute visit for increased confusion, falling 09/19/18 when she stepped  Backward when she was  redirected to go back to her room after pulled down her brief standing in the hallway by her room with walker. Sustained left knee abrasion, no s/s of infection. She did hit her head, no apparent hematoma noted, neuro check has been wnl. She denied headache, dizziness, change of vision, chest pain/pressures, palpitation, nausea, or vomiting. She is afebrile.     Past Medical History:  Diagnosis Date  . Abnormal liver  function tests    11/11/15 AST 31, ALT 88, alk phos 96   . Acute kidney failure, unspecified ([place]) 01/24/2012  . Arthralgia 09/26/2014   Multiple joints: knees, shoulders, wrists, spine hips   . Arthritis   . Candidiasis of other urogenital sites 08/10/2012  . Cerebral embolism 05/23/2005  . CHF (congestive heart failure) ([place])   . Cholelithiasis 11/04/2015  . Closed fracture of sacrum and coccyx without mention of spinal cord injury 02/14/2012  . Constipation 05/03/2013  . Contusion of face, scalp, and neck except eye(s) 06/01/2012  . Contusion of wrist 06/01/2012  . COPD (chronic obstructive pulmonary disease) ([place])   . COPD, mild ([place]) 01/20/2013  . Edema 04/20/2012  . H/O: CVA (cerebrovascular accident)   . Hearing loss 09/26/2014  . History of cancer of uterus   . HTN (hypertension), benign   . Hyperglycemia 11/14/2015  . Hypothyroidism   . Insomnia, unspecified 08/10/2012  . Open wound of knee, leg (except thigh), and ankle, without mention of complication 5/0/2774  . Other and unspecified hyperlipidemia 06/01/2012  . Other disorder of coccyx 01/27/2012  . Pain in joint, ankle and foot 06/14/2012  . Peripheral vascular disease, unspecified ([place]) 01/27/2012  . Personal history of fall 01/27/2012  . Pneumonia, organism unspecified(486) 01/23/2005  . Rheumatic fever 09/26/1933   Age 74 10/01/14 ESR 13, RAF <10   . Seasonal allergies 05/03/2013  . Stasis dermatitis of both legs 09/26/2014  . Unspecified constipation 11/02/2012  . Unspecified hearing loss 02/08/2013  . Unspecified hereditary and idiopathic peripheral neuropathy 01/27/2012  . Urinary frequency 02/24/2014  . Ventricular fibrillation ([place]) 01/24/2012  Past Surgical History:  Procedure Laterality Date  . ABDOMINAL HYSTERECTOMY  1990   for endometrial cancer  . CATARACT EXTRACTION W/ INTRAOCULAR LENS  IMPLANT, BILATERAL    . [place]  . GUM SURGERY  1932  . MASTOIDECTOMY  1920   bilateral  . THYROIDECTOMY   1960  . TONSILLECTOMY  1916    Allergies  Allergen Reactions  . Amoxicillin     Unknown, patient unable to answer questionnaire   . Aspirin Other (See Comments)    On MAR  . Avelox [Moxifloxacin]     Unknown: listed on MAR  . Erythromycin     Unknown: listed on MAR  . Monistat [Miconazole]     Unknown: listed on MAR  . Morphine And Related     Unknown: listed on MAR  . Orange Juice [Orange Oil]     Unknown: listed on [facility]    Outpatient Encounter Medications as of 09/20/2018  Medication Sig  . acetaminophen (TYLENOL) 325 MG tablet Take 650 mg by mouth every 8 (eight) hours.   Marland Kitchen acetaminophen (TYLENOL) 500 MG tablet Take 2 tablets (1,000 mg total) by mouth every 6 (six) hours as needed. (Patient taking differently: Take 1,000 mg by mouth 2 (two) times daily. )  . alum & mag hydroxide-simeth (MAALOX/MYLANTA) 200-200-20 MG/5ML suspension Take 30 mLs by mouth every 6 (six) hours as needed for indigestion or heartburn.  Marland Kitchen aspirin 81 MG chewable tablet Chew 81 mg by mouth daily.  . B Complex-C (B-COMPLEX WITH VITAMIN C) tablet Take 1 tablet by mouth daily.  . calcium citrate-vitamin D (CITRACAL+D) 315-200 MG-UNIT per tablet Take 1 tablet by mouth daily.  . carboxymethylcellulose (REFRESH TEARS) 0.5 % SOLN Place 1 drop into both eyes 2 (two) times daily.  Marland Kitchen docusate sodium (COLACE) 100 MG capsule Take 200 mg by mouth daily.   . fexofenadine (ALLEGRA) 60 MG tablet Take 60 mg by mouth daily.  . furosemide (LASIX) 20 MG tablet Take 1 tablet by mouth daily.  Marland Kitchen ipratropium-albuterol (DUONEB) 0.5-2.5 (3) MG/3ML SOLN Take 3 mLs by nebulization 2 (two) times daily.   Marland Kitchen levothyroxine (SYNTHROID, LEVOTHROID) 150 MCG tablet Take 150 mcg by mouth daily.   . magnesium hydroxide (MILK OF MAGNESIA) 400 MG/5ML suspension Take 30 mLs by mouth daily as needed for mild constipation.  . Melatonin 3 MG TABS Take 3 mg by mouth at bedtime.   . Multiple Vitamin (MULTIVITAMIN WITH MINERALS) TABS tablet Take 1  tablet by mouth daily.  . potassium chloride (K-DUR,KLOR-CON) 10 MEQ tablet Take 10 mEq by mouth daily.    No facility-administered encounter medications on file as of 09/20/2018.    ROS was provided with assistance of staff Review of Systems  Constitutional: Positive for fatigue. Negative for activity change, appetite change, chills, diaphoresis and fever.  HENT: Negative for congestion and voice change.   Respiratory: Negative for cough, shortness of breath and wheezing.   Cardiovascular: Positive for leg swelling. Negative for chest pain and palpitations.  Gastrointestinal: Negative for abdominal distention, abdominal pain, constipation, diarrhea, nausea and vomiting.  Genitourinary: Negative for difficulty urinating, dysuria and urgency.       Urinary leakage.   Musculoskeletal: Positive for arthralgias and gait problem.  Skin: Positive for wound.  Neurological: Negative for dizziness, tremors, facial asymmetry, speech difficulty, weakness and headaches.       Confusion, memory lapses.   Psychiatric/Behavioral: Positive for confusion. Negative for agitation, behavioral problems, hallucinations and sleep disturbance. The patient  is not nervous/anxious.     Immunization History  Administered Date(s) Administered  . Influenza Whole 10/19/2012  . Influenza-Unspecified 10/18/2013, 10/17/2014, 09/02/2015, 09/29/2016, 10/03/2017  . PPD Test 01/22/2013, 11/28/2015  . Pneumococcal Conjugate-13 11/18/2017  . Pneumococcal Polysaccharide-23 01/22/2013  . Td 02/21/2009  . Tdap 04/07/2015   Pertinent  Health Maintenance Due  Topic Date Due  . DEXA SCAN  03/25/1978  . INFLUENZA VACCINE  10/12/2018 (Originally 07/13/2018)  . PNA vac Low Risk Adult  Completed   Fall Risk  09/05/2018 09/02/2017 11/14/2015 04/17/2015 09/26/2014  Falls in the past year? Yes No No Yes No  Number falls in past yr: 2 or more - - 1 -  Injury with Fall? Yes - - Yes -  Comment - - - laceration forehead 04/07/15 -  Risk  Factor Category  - - - High Fall Risk -  Risk for fall due to : - - Impaired mobility;Impaired vision - -   Functional Status Survey:    Vitals:   09/20/18 1205  BP: 132/74  Pulse: 78  Resp: 20  Temp: 97.6 F (36.4 C)  SpO2: 95%  Weight: 155 lb 3.2 oz (70.4 kg)  Height: _0  (1.575 m)   Body mass index is 28.39 kg/m. Physical Exam  Constitutional: She appears well-developed and well-nourished.  HENT:  Head: Normocephalic.  Eyes: Pupils are equal, round, and reactive to light. EOM are normal.  Neck: Normal range of motion. Neck supple. No JVD present. No thyromegaly present.  Cardiovascular: Normal rate and regular rhythm.  Pulmonary/Chest: Effort normal. She has no wheezes. She has no rales.  Abdominal: Soft. She exhibits no distension. There is no tenderness. There is no rebound and no guarding.  Musculoskeletal: She exhibits edema.  1+ edema BLE  Neurological: She is alert. No cranial nerve deficit. She exhibits normal muscle tone. Coordination normal.  Oriented to person and place.   Skin: Skin is warm and dry.  Right forehead hematoma is healing nicely. New abrasion left knee has no s/s of infection.   Psychiatric: She has a normal mood and affect. Her behavior is normal.    Labs reviewed: Recent Labs    12/26/17 0449 12/26/17 0509 02/28/18 05/30/18 08/17/18 1539  NA 140 141 142 141 138  K 4.8 4.5 4.8 3.8 4.2  CL 104 101  --  106 100  CO2 27  --   --  30 26  GLUCOSE 108* 103*  --   --  112*  BUN 30* 34* 45* 37* 27*  CREATININE 1.00 1.00 1.0 0.9 0.98  CALCIUM 9.1  --   --  8.7 9.4   Recent Labs    12/26/17 0449 05/30/18 08/17/18 1539  AST _1 ALT _2 ALKPHOS 59 54 60  BILITOT 0.7  --  0.4  PROT 6.0*  --  6.4*  ALBUMIN 3.4* 3.4 3.4*   Recent Labs    12/26/17 0449  05/30/18 08/17/18 1539 09/12/18  WBC 8.2   < > 6.5 8.8 7.7  NEUTROABS 4.5  --   --  6.3  --   HGB 14.9   < > 13.7 14.7 13.5  HCT 46.7*   < > 41 46.3* 41  MCV 93.4  --    --  92.2  --   PLT 181   < > 171 185 198   < > = values in this interval not displayed.   Lab Results  Component Value Date   TSH  1.54 09/12/2018   No results found for: HGBA1C No results found for: CHOL, HDL, LDLCALC, LDLDIRECT, TRIG, CHOLHDL  Significant Diagnostic Results in last 30 days:  No results found.  Assessment/Plan Confusion increased confusion, falling 09/19/18 when she stepped  Backward when she was  redirected to go back to her room after pulled down her brief standing in the hallway by her room with walker.will obtain CBC/diff, CMP, VS q shift x 72 hours.   Fall falling 09/19/18 when she stepped  Backward when she was  redirected to go back to her room after pulled down her brief standing in the hallway by her room with walker. Close supervision for safety.  Gait instability Chronic, ambulates with walker  Abrasion, left knee, initial encounter No s/s of infection, able to bear weight, no change of ROM. Observe.   Facial contusion Right side, healing nicely.     Family/ staff Communication: plan of care reviewed with the patient and charge nurse.   Labs/tests ordered: CBC/diff, CMP  Time spend 25 minutes.

## 2018-09-20 NOTE — Assessment & Plan Note (Signed)
No s/s of infection, able to bear weight, no change of ROM. Observe.

## 2018-09-20 NOTE — Assessment & Plan Note (Signed)
Chronic, ambulates with walker

## 2018-09-20 NOTE — Assessment & Plan Note (Signed)
falling 09/19/18 when she stepped  Backward when she was  redirected to go back to her room after pulled down her brief standing in the hallway by her room with walker. Close supervision for safety.

## 2018-09-20 NOTE — Assessment & Plan Note (Signed)
Right side, healing nicely.

## 2018-09-21 ENCOUNTER — Encounter: Payer: Self-pay | Admitting: Nurse Practitioner

## 2018-09-21 LAB — BASIC METABOLIC PANEL
BUN: 33 — AB (ref 4–21)
Creatinine: 1 (ref <=1.1)
Glucose: 107
Potassium: 4.1 (ref 3.4–5.3)
Sodium: 137 (ref 137–147)

## 2018-09-21 LAB — HEPATIC FUNCTION PANEL
ALK PHOS: 60 (ref 25–125)
ALT: 30 (ref 7–35)
AST: 27 (ref 13–35)
BILIRUBIN, TOTAL: 0.6

## 2018-09-21 LAB — CBC AND DIFFERENTIAL
HEMATOCRIT: 42 (ref 36–46)
Hemoglobin: 13.8 (ref 12.0–16.0)
Platelets: 187 (ref 150–399)
WBC: 6.1

## 2018-09-22 ENCOUNTER — Other Ambulatory Visit: Payer: Self-pay | Admitting: *Deleted

## 2018-09-22 LAB — COMPLETE METABOLIC PANEL WITH GFR
CHLORIDE: 100
CO2: 30
Calcium: 9
Globulin: 2.3
Total Protein: 5.8 g/dL

## 2018-10-11 ENCOUNTER — Encounter: Payer: Self-pay | Admitting: Nurse Practitioner

## 2018-10-11 ENCOUNTER — Non-Acute Institutional Stay: Payer: Medicare Other | Admitting: Nurse Practitioner

## 2018-10-11 DIAGNOSIS — M2550 Pain in unspecified joint: Secondary | ICD-10-CM

## 2018-10-11 DIAGNOSIS — K5901 Slow transit constipation: Secondary | ICD-10-CM | POA: Diagnosis not present

## 2018-10-11 DIAGNOSIS — H9193 Unspecified hearing loss, bilateral: Secondary | ICD-10-CM

## 2018-10-11 DIAGNOSIS — E039 Hypothyroidism, unspecified: Secondary | ICD-10-CM | POA: Diagnosis not present

## 2018-10-11 DIAGNOSIS — I739 Peripheral vascular disease, unspecified: Secondary | ICD-10-CM | POA: Diagnosis not present

## 2018-10-11 NOTE — Assessment & Plan Note (Signed)
Multiple sites, continue Tylenol 1000mg  bid, 650mg  q8h prn, w/c for mobility.

## 2018-10-11 NOTE — Assessment & Plan Note (Signed)
Speaker needs to adjust tone of voice with eye contact to be heard and understood.

## 2018-10-11 NOTE — Progress Notes (Signed)
Location:  [place] Room Number: 836 Place of Service:  ALF (13) Provider:  Marlana Latus  NP  Teoman Giraud X, NP  Patient Care Team: Eleonora Peeler X, NP as PCP - General (Internal Medicine) [place], [place], [facility] Alphonsus Doyel X, NP as Nurse Practitioner (Nurse Practitioner)  Extended Emergency Contact Information Primary Emergency Contact: [name] [place] of [place] Phone: [phone] Relation: Son Secondary Emergency Contact: [place] Phone: [phone] Mobile Phone: [phone] Relation: Daughter  Code Status:  DNR Goals of care: Advanced Directive information Advanced Directives 09/20/2018  Does Patient Have a Medical Advance Directive? Yes  Type of Paramedic of [place];Living will;Out of facility DNR (pink MOST or yellow form)  Does patient want to make changes to medical advance directive? No - Patient declined  Copy of [place] in Chart? Yes  Would patient like information on creating a medical advance directive? -  Pre-existing out of facility DNR order (yellow form or pink MOST form) Yellow form placed in chart (order not valid for inpatient use)     Chief Complaint  Patient presents with  . Medical Management of Chronic Issues    HPI:  Pt is a 84 y.o. female seen today for medical management of chronic diseases.     The patient resides in [place] for safety and care assistance, she transfers self, w/c for mobility. Multiple sites of OA pain is managed with Tylenol 1090m bid, prn [id number]. No constipation while on Colace 20054md, MOM prn. Hypothyroidism, on Levothyroxine 150m3md, last TSH 1.54 09/12/18. PVD, chronic edema BLE, on Furosemide 20mg34m  Past Medical History:  Diagnosis Date  . Abnormal liver function tests    11/11/15 AST 31, ALT 88, alk phos 96   . Acute kidney failure, unspecified (HCC) [place]1/2013  . Arthralgia 09/26/2014     Multiple joints: knees, shoulders, wrists, spine hips   . Arthritis   . Candidiasis of other urogenital sites 08/10/2012  . Cerebral embolism 05/23/2005  . CHF (congestive heart failure) (HCC) [place] Cholelithiasis 11/04/2015  . Closed fracture of sacrum and coccyx without mention of spinal cord injury 02/14/2012  . Constipation 05/03/2013  . Contusion of face, scalp, and neck except eye(s) 06/01/2012  . Contusion of wrist 06/01/2012  . COPD (chronic obstructive pulmonary disease) (HCC) [place] COPD, mild (HCC) [place]/2014  . Edema 04/20/2012  . H/O: CVA (cerebrovascular accident)   . Hearing loss 09/26/2014  . History of cancer of uterus   . HTN (hypertension), benign   . Hyperglycemia 11/14/2015  . Hypothyroidism   . Insomnia, unspecified 08/10/2012  . Open wound of knee, leg (except thigh), and ankle, without mention of complication 04/21/23/4/9675ther and unspecified hyperlipidemia 06/01/2012  . Other disorder of coccyx 01/27/2012  . Pain in joint, ankle and foot 06/14/2012  . Peripheral vascular disease, unspecified (HCC) [place]/2013  . Personal history of fall 01/27/2012  . Pneumonia, organism unspecified([place]) 01/23/2005  . Rheumatic fever 09/26/1933   Age 29 10/01/14 ESR 13, RAF <10   . Seasonal allergies 05/03/2013  . Stasis dermatitis of both legs 09/26/2014  . Unspecified constipation 11/02/2012  . Unspecified hearing loss 02/08/2013  . Unspecified hereditary and idiopathic peripheral neuropathy 01/27/2012  . Urinary frequency 02/24/2014  . Ventricular fibrillation (HCC) [place]/2013   Past Surgical History:  Procedure Laterality Date  . ABDOMINAL HYSTERECTOMY  1990   for endometrial cancer  . CATARACT  EXTRACTION W/ INTRAOCULAR LENS  IMPLANT, BILATERAL    . [place]  . GUM SURGERY  1932  . MASTOIDECTOMY  1920   bilateral  . THYROIDECTOMY  1960  . TONSILLECTOMY  1916    Allergies  Allergen Reactions  . Amoxicillin     Unknown, patient unable to answer questionnaire    . Aspirin Other (See Comments)    On MAR  . Avelox [Moxifloxacin]     Unknown: listed on MAR  . Erythromycin     Unknown: listed on MAR  . Monistat [Miconazole]     Unknown: listed on MAR  . Morphine And Related     Unknown: listed on MAR  . Orange Juice [Orange Oil]     Unknown: listed on [facility]    Outpatient Encounter Medications as of 10/11/2018  Medication Sig  . acetaminophen (TYLENOL) 325 MG tablet Take 650 mg by mouth every 8 (eight) hours.   Marland Kitchen acetaminophen (TYLENOL) 500 MG tablet Take 1,000 mg by mouth 2 (two) times daily.   Marland Kitchen alum & mag hydroxide-simeth (MAALOX/MYLANTA) 200-200-20 MG/5ML suspension Take 30 mLs by mouth every 6 (six) hours as needed for indigestion or heartburn.  Marland Kitchen aspirin 81 MG chewable tablet Chew 81 mg by mouth daily.  . B Complex-C (B-COMPLEX WITH VITAMIN C) tablet Take 1 tablet by mouth daily.  . calcium citrate-vitamin D (CITRACAL+D) 315-200 MG-UNIT per tablet Take 1 tablet by mouth daily.  . carboxymethylcellulose (REFRESH TEARS) 0.5 % SOLN Place 1 drop into both eyes 2 (two) times daily.  Marland Kitchen docusate sodium (COLACE) 100 MG capsule Take 200 mg by mouth daily.   . fexofenadine (ALLEGRA) 60 MG tablet Take 60 mg by mouth daily.  . furosemide (LASIX) 20 MG tablet Take 1 tablet by mouth daily.  Marland Kitchen ipratropium-albuterol (DUONEB) 0.5-2.5 (3) MG/3ML SOLN Take 3 mLs by nebulization 2 (two) times daily.   Marland Kitchen levothyroxine (SYNTHROID, LEVOTHROID) 150 MCG tablet Take 150 mcg by mouth daily.   . magnesium hydroxide (MILK OF MAGNESIA) 400 MG/5ML suspension Take 30 mLs by mouth daily as needed for mild constipation.  . Melatonin 3 MG TABS Take 3 mg by mouth at bedtime.   . Multiple Vitamin (MULTIVITAMIN WITH MINERALS) TABS tablet Take 1 tablet by mouth daily.  . potassium chloride (K-DUR,KLOR-CON) 10 MEQ tablet Take 10 mEq by mouth daily.   . [DISCONTINUED] acetaminophen (TYLENOL) 500 MG tablet Take 2 tablets (1,000 mg total) by mouth every 6 (six) hours as needed.  (Patient taking differently: Take 1,000 mg by mouth 2 (two) times daily. )   No facility-administered encounter medications on file as of 10/11/2018.    ROS was provided with assistance of staff Review of Systems  Constitutional: Negative for activity change, appetite change, chills, diaphoresis, fatigue, fever and unexpected weight change.  HENT: Positive for hearing loss. Negative for congestion and voice change.   Respiratory: Negative for cough, shortness of breath and wheezing.   Cardiovascular: Positive for leg swelling. Negative for chest pain and palpitations.  Gastrointestinal: Negative for abdominal distention, abdominal pain, constipation, diarrhea, nausea and vomiting.  Genitourinary: Negative for difficulty urinating, dysuria and urgency.       Urinary leakage.   Musculoskeletal: Positive for arthralgias, back pain and gait problem.  Skin: Positive for color change.       Chronic mild erythema BLE  Neurological: Negative for dizziness, facial asymmetry, speech difficulty, weakness and headaches.       Memory lapses.   Psychiatric/Behavioral: Negative  for agitation, behavioral problems, hallucinations and sleep disturbance. The patient is not nervous/anxious.     Immunization History  Administered Date(s) Administered  . Influenza Whole 10/19/2012, 09/15/2018  . Influenza-Unspecified 10/18/2013, 10/17/2014, 09/02/2015, 09/29/2016, 10/03/2017  . PPD Test 01/22/2013, 11/28/2015  . Pneumococcal Conjugate-13 11/18/2017  . Pneumococcal Polysaccharide-23 01/22/2013  . Td 02/21/2009  . Tdap 04/07/2015   Pertinent  Health Maintenance Due  Topic Date Due  . INFLUENZA VACCINE  Completed  . PNA vac Low Risk Adult  Completed   Fall Risk  09/05/2018 09/02/2017 11/14/2015 04/17/2015 09/26/2014  Falls in the past year? Yes No No Yes No  Number falls in past yr: 2 or more - - 1 -  Injury with Fall? Yes - - Yes -  Comment - - - laceration forehead 04/07/15 -  Risk Factor Category  - - -  High Fall Risk -  Risk for fall due to : - - Impaired mobility;Impaired vision - -   Functional Status Survey:    Vitals:   10/11/18 1026  BP: 126/70  Pulse: 84  Resp: 18  Temp: 97.9 F (36.6 C)  SpO2: 90%  Weight: 155 lb 3.2 oz (70.4 kg)  Height: 5' 2"  (1.575 m)   Body mass index is 28.39 kg/m. Physical Exam  Constitutional: She appears well-developed and well-nourished.  HENT:  Head: Normocephalic and atraumatic.  Eyes: Pupils are equal, round, and reactive to light. EOM are normal.  Neck: Normal range of motion. Neck supple. No JVD present. No thyromegaly present.  Cardiovascular: Normal rate and regular rhythm.  No murmur heard. Pulmonary/Chest: Effort normal. She has no wheezes. She has no rales.  Abdominal: Soft. She exhibits no distension. There is no tenderness. There is no rebound and no guarding.  Musculoskeletal: She exhibits edema.  1+ edema BLE. Self transfer. W/c for mobility.   Neurological: She is alert. No cranial nerve deficit. She exhibits normal muscle tone. Coordination normal.  Oriented to person and place.   Skin: Skin is warm and dry. There is erythema.  Mild erythema BLE  Psychiatric: She has a normal mood and affect. Her behavior is normal.    Labs reviewed: Recent Labs    12/26/17 0449 12/26/17 0509  05/30/18 08/17/18 1539 09/21/18  NA 140 141   < > 141 138 137  K 4.8 4.5   < > 3.8 4.2 4.1  CL 104 101  --  106 100 100  CO2 27  --   --  30 26 30   GLUCOSE 108* 103*  --   --  112*  --   BUN 30* 34*   < > 37* 27* 33*  CREATININE 1.00 1.00   < > 0.9 0.98 1.0  CALCIUM 9.1  --   --  8.7 9.4 9.0   < > = values in this interval not displayed.   Recent Labs    12/26/17 0449 05/30/18 08/17/18 1539 09/21/18  AST 25 18 26 27   ALT 22 17 19 30   ALKPHOS 59 54 60 60  BILITOT 0.7  --  0.4  --   PROT 6.0*  --  6.4* 5.8  ALBUMIN 3.4* 3.4 3.4* .3.5   Recent Labs    12/26/17 0449  08/17/18 1539 09/12/18 09/21/18  WBC 8.2   < > 8.8 7.7 6.1    NEUTROABS 4.5  --  6.3  --   --   HGB 14.9   < > 14.7 13.5 13.8  HCT 46.7*   < >  46.3* 41 42  MCV 93.4  --  92.2  --   --   PLT 181   < > 185 198 187   < > = values in this interval not displayed.   Lab Results  Component Value Date   TSH 1.54 09/12/2018   No results found for: HGBA1C No results found for: CHOL, HDL, LDLCALC, LDLDIRECT, TRIG, CHOLHDL  Significant Diagnostic Results in last 30 days:  No results found.  Assessment/Plan PVD (peripheral vascular disease) (HCC) Chronic edema 1+ BLE, continue Furosemide 40m daily.   Hypothyroidism Stable, last TSH 1.54 09/12/18, continue Levothyroxine 1526m qd.   Hearing loss Speaker needs to adjust tone of voice with eye contact to be heard and understood.   Constipation Stable, continue Colace 20021md, prn MOM  Arthralgia Multiple sites, continue Tylenol 1000m25md, 650mg74m prn, w/c for mobility.      Family/ staff Communication: Plan of care reviewed with the patient and charge nurse.    Labs/tests ordered:  None  Time spend 25 minutes.

## 2018-10-11 NOTE — Assessment & Plan Note (Signed)
Chronic edema 1+ BLE, continue Furosemide 20mg  daily.

## 2018-10-11 NOTE — Assessment & Plan Note (Signed)
Stable, continue Colace 200mg  qd, prn MOM

## 2018-10-11 NOTE — Assessment & Plan Note (Signed)
Stable, last TSH 1.54 09/12/18, continue Levothyroxine qd.

## 2018-12-01 ENCOUNTER — Telehealth: Payer: Self-pay | Admitting: *Deleted

## 2018-12-01 NOTE — Telephone Encounter (Signed)
Patient son, Onalee HuaDavid called and wanted to know if we received paperwork from Social Security Dept to fill out to make him Representative Payee for patient.   No paperwork received here at our office. [facility]. He will give them a call.

## 2019-01-18 ENCOUNTER — Encounter: Payer: Self-pay | Admitting: Nurse Practitioner

## 2019-01-18 ENCOUNTER — Non-Acute Institutional Stay: Payer: Medicare Other | Admitting: Nurse Practitioner

## 2019-01-18 DIAGNOSIS — I739 Peripheral vascular disease, unspecified: Secondary | ICD-10-CM | POA: Diagnosis not present

## 2019-01-18 DIAGNOSIS — R5381 Other malaise: Secondary | ICD-10-CM | POA: Diagnosis not present

## 2019-01-18 DIAGNOSIS — R404 Transient alteration of awareness: Secondary | ICD-10-CM

## 2019-01-18 DIAGNOSIS — I10 Essential (primary) hypertension: Secondary | ICD-10-CM

## 2019-01-18 DIAGNOSIS — R5383 Other fatigue: Secondary | ICD-10-CM

## 2019-01-18 DIAGNOSIS — R4182 Altered mental status, unspecified: Secondary | ICD-10-CM | POA: Insufficient documentation

## 2019-01-18 LAB — BASIC METABOLIC PANEL
BUN: 36 — AB (ref 4–21)
Creatinine: 0.9 (ref <=1.1)
GLUCOSE: 90
POTASSIUM: 4 (ref 3.4–5.3)
SODIUM: 139 (ref 137–147)

## 2019-01-18 LAB — HEPATIC FUNCTION PANEL
ALT: 14 (ref 7–35)
AST: 17 (ref 13–35)
Alkaline Phosphatase: 54 (ref 25–125)
Bilirubin, Total: 0.6

## 2019-01-18 LAB — CBC AND DIFFERENTIAL
HEMATOCRIT: 41 (ref 36–46)
Hemoglobin: 13.6 (ref 12.0–16.0)
Platelets: 190 (ref 150–399)
WBC: 6.7

## 2019-01-18 NOTE — Assessment & Plan Note (Signed)
Blood pressure is normalized, continue Furosemide 20mg  qd.

## 2019-01-18 NOTE — Progress Notes (Signed)
Location:  [place] Room Number: 833 Place of Service:  ALF (13) Provider:  Marlana Latus  NP  Mast, Man X, NP  Patient Care Team: Mast, Man X, NP as PCP - General (Internal Medicine) [place], [place], [facility] Mast, Man X, NP as Nurse Practitioner (Nurse Practitioner)  Extended Emergency Contact Information Primary Emergency Contact: [name] [place] of [place] Phone: [phone] Relation: Son Secondary Emergency Contact: [place] Phone: [phone] Mobile Phone: [phone] Relation: Daughter  Code Status:  DNR Goals of care: Advanced Directive information Advanced Directives 01/18/2019  Does Patient Have a Medical Advance Directive? Yes  Type of Paramedic of [place];Out of facility DNR (pink MOST or yellow form);Living will  Does patient want to make changes to medical advance directive? No - Patient declined  Copy of [place] in Chart? Yes - validated most recent copy scanned in chart (See row information)  Would patient like information on creating a medical advance directive? -  Pre-existing out of facility DNR order (yellow form or pink MOST form) Yellow form placed in chart (order not valid for inpatient use);Pink MOST form placed in chart (order not valid for inpatient use)     Chief Complaint  Patient presents with  . Acute Visit    Lethargy    HPI:  Pt is a 85 y.o. female seen today for an acute visit for lethargy, decreased appetite x 1-2 days. Bp is normalized 122/70 today. She is able to follow command today. HPI was provided with assistance of staff. She denied headache, vision change, chest pain/pressure, SOB, cough, palpitation, or focal weakness. Pending CBC/diff, CMP. Hx of HTN/chronic PVD on Furosemide 62m qd. Kcl 17m qd.    Past Medical History:  Diagnosis Date  . Abnormal liver function tests    11/11/15 AST 31, ALT 88,  alk phos 96   . Acute kidney failure, unspecified ([facility]2/10/2012  . Arthralgia 09/26/2014   Multiple joints: knees, shoulders, wrists, spine hips   . Arthritis   . Candidiasis of other urogenital sites 08/10/2012  . Cerebral embolism 05/23/2005  . CHF (congestive heart failure) ([facility]  . Cholelithiasis 11/04/2015  . Closed fracture of sacrum and coccyx without mention of spinal cord injury 02/14/2012  . Constipation 05/03/2013  . Contusion of face, scalp, and neck except eye(s) 06/01/2012  . Contusion of wrist 06/01/2012  . COPD (chronic obstructive pulmonary disease) ([facility]  . COPD, mild (HCM[facility]2/07/2013  . Edema 04/20/2012  . H/O: CVA (cerebrovascular accident)   . Hearing loss 09/26/2014  . History of cancer of uterus   . HTN (hypertension), benign   . Hyperglycemia 11/14/2015  . Hypothyroidism   . Insomnia, unspecified 08/10/2012  . Open wound of knee, leg (except thigh), and ankle, without mention of complication 5/03/14/6833. Other and unspecified hyperlipidemia 06/01/2012  . Other disorder of coccyx 01/27/2012  . Pain in joint, ankle and foot 06/14/2012  . Peripheral vascular disease, unspecified (HCFriend[facility]2/14/2013  . Personal history of fall 01/27/2012  . Pneumonia, organism unspecified(486) 01/23/2005  . Rheumatic fever 09/26/1933   Age 53 10/01/14 ESR 13, RAF <10   . Seasonal allergies 05/03/2013  . Stasis dermatitis of both legs 09/26/2014  . Unspecified constipation 11/02/2012  . Unspecified hearing loss 02/08/2013  . Unspecified hereditary and idiopathic peripheral neuropathy 01/27/2012  . Urinary frequency 02/24/2014  . Ventricular fibrillation (HCChincote[facility]2/10/2012   Past Surgical History:  Procedure Laterality Date  .  ABDOMINAL HYSTERECTOMY  1990   for endometrial cancer  . CATARACT EXTRACTION W/ INTRAOCULAR LENS  IMPLANT, BILATERAL    . [place]  . GUM SURGERY  1932  . MASTOIDECTOMY  1920   bilateral  . THYROIDECTOMY  1960  . TONSILLECTOMY  1916     Allergies  Allergen Reactions  . Amoxicillin     Unknown, patient unable to answer questionnaire   . Aspirin Other (See Comments)    On MAR  . Avelox [Moxifloxacin]     Unknown: listed on MAR  . Erythromycin     Unknown: listed on MAR  . Monistat [Miconazole]     Unknown: listed on MAR  . Morphine And Related     Unknown: listed on MAR  . Orange Juice [Orange Oil]     Unknown: listed on [facility]    Outpatient Encounter Medications as of 01/18/2019  Medication Sig  . acetaminophen (TYLENOL) 325 MG tablet Take 650 mg by mouth every 8 (eight) hours.   Marland Kitchen acetaminophen (TYLENOL) 500 MG tablet Take 1,000 mg by mouth 2 (two) times daily.   Marland Kitchen aluminum-magnesium hydroxide-simethicone (MAALOX) 976-734-19 MG/5ML SUSP Take 30 mLs by mouth every 6 (six) hours as needed.  Marland Kitchen aspirin 81 MG chewable tablet Chew 81 mg by mouth daily.  . B Complex-C (B-COMPLEX WITH VITAMIN C) tablet Take 1 tablet by mouth daily.  . calcium citrate-vitamin D (CITRACAL+D) 315-200 MG-UNIT per tablet Take 1 tablet by mouth daily.  . carboxymethylcellulose (REFRESH TEARS) 0.5 % SOLN Place 1 drop into both eyes 2 (two) times daily.  . clotrimazole-betamethasone (LOTRISONE) cream Apply 1 application topically 2 (two) times daily.  Marland Kitchen docusate sodium (COLACE) 100 MG capsule Take 200 mg by mouth daily.   . fexofenadine (ALLEGRA) 60 MG tablet Take 60 mg by mouth daily.  . furosemide (LASIX) 20 MG tablet Take 1 tablet by mouth daily.  Marland Kitchen ipratropium-albuterol (DUONEB) 0.5-2.5 (3) MG/3ML SOLN Take 3 mLs by nebulization 2 (two) times daily.   Marland Kitchen levothyroxine (SYNTHROID, LEVOTHROID) 150 MCG tablet Take 150 mcg by mouth daily.   . magnesium hydroxide (MILK OF MAGNESIA) 400 MG/5ML suspension Take 30 mLs by mouth daily as needed for mild constipation.  . Melatonin 3 MG TABS Take 3 mg by mouth at bedtime.   . Multiple Vitamin (MULTIVITAMIN WITH MINERALS) TABS tablet Take 1 tablet by mouth daily.  . potassium chloride (K-DUR,KLOR-CON) 10  MEQ tablet Take 10 mEq by mouth daily.   . Sodium Fluoride (PREVIDENT 5000 BOOSTER) 1.1 % PSTE Place onto teeth at bedtime. Brush teeth with a toothbrush after PM care. Spit out excess and do not rinse.  . [DISCONTINUED] alum & mag hydroxide-simeth (MAALOX/MYLANTA) 200-200-20 MG/5ML suspension Take 30 mLs by mouth every 6 (six) hours as needed for indigestion or heartburn.   No facility-administered encounter medications on file as of 01/18/2019.     Review of Systems  Constitutional: Positive for activity change, appetite change and fatigue. Negative for diaphoresis and fever.  HENT: Positive for hearing loss. Negative for congestion and voice change.   Respiratory: Negative for cough, shortness of breath and wheezing.   Cardiovascular: Positive for leg swelling. Negative for chest pain and palpitations.  Gastrointestinal: Negative for abdominal distention, abdominal pain, constipation, diarrhea, nausea and vomiting.  Genitourinary: Negative for difficulty urinating, dysuria and urgency.       Incontinent of urine.   Musculoskeletal: Positive for arthralgias, back pain and gait problem.  Skin: Negative for color change  and pallor.       Chronic erythematous venous insufficiency changes BLE  Neurological: Negative for dizziness, speech difficulty, weakness and headaches.       Memory lapses.   Psychiatric/Behavioral: Negative for agitation, behavioral problems, hallucinations and sleep disturbance. The patient is not nervous/anxious.     Immunization History  Administered Date(s) Administered  . Influenza Whole 10/19/2012, 09/15/2018  . Influenza-Unspecified 10/18/2013, 10/17/2014, 09/02/2015, 09/29/2016, 10/03/2017  . PPD Test 01/22/2013, 11/28/2015  . Pneumococcal Conjugate-13 11/18/2017  . Pneumococcal Polysaccharide-23 01/22/2013  . Td 02/21/2009  . Tdap 04/07/2015   Pertinent  Health Maintenance Due  Topic Date Due  . INFLUENZA VACCINE  Completed  . PNA vac Low Risk Adult   Completed   Fall Risk  09/05/2018 09/02/2017 11/14/2015 04/17/2015 09/26/2014  Falls in the past year? Yes No No Yes No  Number falls in past yr: 2 or more - - 1 -  Injury with Fall? Yes - - Yes -  Comment - - - laceration forehead 04/07/15 -  Risk Factor Category  - - - High Fall Risk -  Risk for fall due to : - - Impaired mobility;Impaired vision - -   Functional Status Survey:    Vitals:   01/18/19 1009  BP: 122/70  Pulse: 86  Resp: 18  Temp: (!) 97.3 F (36.3 C)  SpO2: 98%  Weight: 158 lb (71.7 kg)   Body mass index is 28.9 kg/m. Physical Exam Constitutional:      General: She is not in acute distress.    Appearance: Normal appearance. She is not ill-appearing, toxic-appearing or diaphoretic.  HENT:     Head: Normocephalic and atraumatic.     Nose: Nose normal. No congestion.     Mouth/Throat:     Mouth: Mucous membranes are moist.  Eyes:     Extraocular Movements: Extraocular movements intact.     Conjunctiva/sclera: Conjunctivae normal.     Pupils: Pupils are equal, round, and reactive to light.  Neck:     Musculoskeletal: Normal range of motion and neck supple.  Cardiovascular:     Rate and Rhythm: Normal rate and regular rhythm.     Heart sounds: No murmur.  Pulmonary:     Effort: Pulmonary effort is normal.     Breath sounds: Normal breath sounds. No wheezing, rhonchi or rales.  Abdominal:     General: There is no distension.     Palpations: Abdomen is soft.     Tenderness: There is no abdominal tenderness. There is no guarding or rebound.  Musculoskeletal:     Right lower leg: Edema present.     Left lower leg: Edema present.     Comments: 1+ edema BLE. W/c for mobility.   Skin:    General: Skin is warm and dry.     Findings: Erythema present.     Comments: Chronic mild erythematous venous changes BLE  Neurological:     General: No focal deficit present.     Mental Status: She is alert. Mental status is at baseline.     Cranial Nerves: No cranial nerve  deficit.     Motor: No weakness.     Coordination: Coordination normal.     Gait: Gait abnormal.     Comments: Oriented to person and place.   Psychiatric:        Mood and Affect: Mood normal.        Behavior: Behavior normal.     Labs reviewed: Recent Labs  05/30/18 08/17/18 1539 09/21/18  NA 141 138 137  K 3.8 4.2 4.1  CL 106 100 100  CO2 30 26 30   GLUCOSE  --  112*  --   BUN 37* 27* 33*  CREATININE 0.9 0.98 1.0  CALCIUM 8.7 9.4 9.0   Recent Labs    05/30/18 08/17/18 1539 09/21/18  AST 18 26 27   ALT 17 19 30   ALKPHOS 54 60 60  BILITOT  --  0.4  --   PROT  --  6.4* 5.8  ALBUMIN 3.4 3.4* .3.5   Recent Labs    08/17/18 1539 09/12/18 09/21/18  WBC 8.8 7.7 6.1  NEUTROABS 6.3  --   --   HGB 14.7 13.5 13.8  HCT 46.3* 41 42  MCV 92.2  --   --   PLT 185 198 187   Lab Results  Component Value Date   TSH 1.54 09/12/2018   No results found for: HGBA1C No results found for: CHOL, HDL, LDLCALC, LDLDIRECT, TRIG, CHOLHDL  Significant Diagnostic Results in last 30 days:  No results found.  Assessment/Plan Altered mental status Improved from yesterday, no focal neurological symptoms, pending CBC/diff, CMP. Observe.   HTN (hypertension) Blood pressure is normalized, continue Furosemide 30m qd.   PVD (peripheral vascular disease) (HCC) Chronic edema BLE. No s/s of developing CHF  Malaise and fatigue 01/18/19 wbc 6.7, Hgb 13.6, plt 190, neutrophils 60.6, Na 139, K 4.0, Bun 36, creat 0.92, TP 5.9, albumin 3.6     Family/ staff Communication: plan of care reviewed with the patient and charge nurse.   Labs/tests ordered:  CBC/diff, CMP  Time spend 25 minutes.

## 2019-01-18 NOTE — Assessment & Plan Note (Signed)
Chronic edema BLE. No s/s of developing CHF

## 2019-01-18 NOTE — Assessment & Plan Note (Signed)
Improved from yesterday, no focal neurological symptoms, pending CBC/diff, CMP. Observe.

## 2019-01-18 NOTE — Assessment & Plan Note (Signed)
01/18/19 wbc 6.7, Hgb 13.6, plt 190, neutrophils 60.6, Na 139, K 4.0, Bun 36, creat 0.92, TP 5.9, albumin 3.6

## 2019-01-23 ENCOUNTER — Other Ambulatory Visit: Payer: Self-pay | Admitting: *Deleted

## 2019-01-23 LAB — COMPLETE METABOLIC PANEL WITH GFR
Albumin: 3.6
CALCIUM: 8.8
CO2: 29
Chloride: 104
EGFR (Non-African Amer.): 49
GLOBULIN: 2.3
Total Protein: 5.9 g/dL

## 2019-02-02 ENCOUNTER — Encounter: Payer: Self-pay | Admitting: Nurse Practitioner

## 2019-02-02 ENCOUNTER — Non-Acute Institutional Stay: Payer: Medicare Other | Admitting: Nurse Practitioner

## 2019-02-02 DIAGNOSIS — R609 Edema, unspecified: Secondary | ICD-10-CM

## 2019-02-02 DIAGNOSIS — K5901 Slow transit constipation: Secondary | ICD-10-CM | POA: Diagnosis not present

## 2019-02-02 DIAGNOSIS — E039 Hypothyroidism, unspecified: Secondary | ICD-10-CM | POA: Diagnosis not present

## 2019-02-02 DIAGNOSIS — J449 Chronic obstructive pulmonary disease, unspecified: Secondary | ICD-10-CM

## 2019-02-02 DIAGNOSIS — M2550 Pain in unspecified joint: Secondary | ICD-10-CM

## 2019-02-02 NOTE — Assessment & Plan Note (Signed)
Pain mainly in lower back and knees, controlled, continue Tylenol 1000mg  bid, w/c for mobility.

## 2019-02-02 NOTE — Assessment & Plan Note (Signed)
Stable, continue DuoNeb bid.  

## 2019-02-02 NOTE — Progress Notes (Signed)
Location:  [place] Room Number: 474 Place of Service:  ALF (13) Provider:  Marlana Latus  NP  Khadeeja Elden X, NP  Patient Care Team: Legrand Lasser X, NP as PCP - General (Internal Medicine) [place], [place], [facility] Tayson Schnelle X, NP as Nurse Practitioner (Nurse Practitioner)  Extended Emergency Contact Information Primary Emergency Contact: [name] States of [place] Phone: [phone] Relation: Son Secondary Emergency Contact: [place] Phone: [phone] Mobile Phone: [phone] Relation: Daughter  Code Status:  DNR Goals of care: Advanced Directive information Advanced Directives 01/18/2019  Does Patient Have a Medical Advance Directive? Yes  Type of Paramedic of [place];Out of facility DNR (pink MOST or yellow form);Living will  Does patient want to make changes to medical advance directive? No - Patient declined  Copy of [place] in Chart? Yes - validated most recent copy scanned in chart (See row information)  Would patient like information on creating a medical advance directive? -  Pre-existing out of facility DNR order (yellow form or pink MOST form) Yellow form placed in chart (order not valid for inpatient use);Pink MOST form placed in chart (order not valid for inpatient use)     Chief Complaint  Patient presents with  . Medical Management of Chronic Issues    HPI:  Pt is a 85 y.o. female seen today for medical management of chronic diseases.     The patient resides in [place] for safety and care assistance, ambulates with walker, w/c to go further. Hx of edema BLE, chronic, on Furosemide 19m qd. Hypothyroidism, on Levothyroxine 1565m qd, last TSH 1.54 09/12/18. COPD, stable, on DuoNeb bid. No constipation while on Colace 20062md, prn MOM. OA pain, mainly in lower back and knees, stable on Tylenol 1000m40md.    Past Medical History:    Diagnosis Date  . Abnormal liver function tests    11/11/15 AST 31, ALT 88, alk phos 96   . Acute kidney failure, unspecified (HCC)[place]11/2013  . Arthralgia 09/26/2014   Multiple joints: knees, shoulders, wrists, spine hips   . Arthritis   . Candidiasis of other urogenital sites 08/10/2012  . Cerebral embolism 05/23/2005  . CHF (congestive heart failure) (HCC)[place]. Cholelithiasis 11/04/2015  . Closed fracture of sacrum and coccyx without mention of spinal cord injury 02/14/2012  . Constipation 05/03/2013  . Contusion of face, scalp, and neck except eye(s) 06/01/2012  . Contusion of wrist 06/01/2012  . COPD (chronic obstructive pulmonary disease) (HCC)[place]. COPD, mild (HCC)[place]8/2014  . Edema 04/20/2012  . H/O: CVA (cerebrovascular accident)   . Hearing loss 09/26/2014  . History of cancer of uterus   . HTN (hypertension), benign   . Hyperglycemia 11/14/2015  . Hypothyroidism   . Insomnia, unspecified 08/10/2012  . Open wound of knee, leg (except thigh), and ankle, without mention of complication 5/9/1/0/9323Other and unspecified hyperlipidemia 06/01/2012  . Other disorder of coccyx 01/27/2012  . Pain in joint, ankle and foot 06/14/2012  . Peripheral vascular disease, unspecified (HCC)[place]14/2013  . Personal history of fall 01/27/2012  . Pneumonia, organism unspecified(486) 01/23/2005  . Rheumatic fever 09/26/1933   Age 15 10/01/14 ESR 13, RAF <10   . Seasonal allergies 05/03/2013  . Stasis dermatitis of both legs 09/26/2014  . Unspecified constipation 11/02/2012  . Unspecified hearing loss 02/08/2013  . Unspecified hereditary and idiopathic peripheral neuropathy 01/27/2012  . Urinary frequency  02/24/2014  . Ventricular fibrillation ([place]) 01/24/2012   Past Surgical History:  Procedure Laterality Date  . ABDOMINAL HYSTERECTOMY  1990   for endometrial cancer  . CATARACT EXTRACTION W/ INTRAOCULAR LENS  IMPLANT, BILATERAL    . [place]  . GUM SURGERY  1932  . MASTOIDECTOMY   1920   bilateral  . THYROIDECTOMY  1960  . TONSILLECTOMY  1916    Allergies  Allergen Reactions  . Amoxicillin     Unknown, patient unable to answer questionnaire   . Aspirin Other (See Comments)    On MAR  . Avelox [Moxifloxacin]     Unknown: listed on MAR  . Erythromycin     Unknown: listed on MAR  . Monistat [Miconazole]     Unknown: listed on MAR  . Morphine And Related     Unknown: listed on MAR  . Orange Juice [Orange Oil]     Unknown: [facility] on [facility]    Outpatient Encounter Medications as of 02/02/2019  Medication Sig  . acetaminophen (TYLENOL) 325 MG tablet Take 650 mg by mouth every 8 (eight) hours.   Marland Kitchen acetaminophen (TYLENOL) 500 MG tablet Take 1,000 mg by mouth 2 (two) times daily.   Marland Kitchen aluminum-magnesium hydroxide-simethicone (MAALOX) 812-751-70 MG/5ML SUSP Take 30 mLs by mouth every 6 (six) hours as needed.  Marland Kitchen aspirin 81 MG chewable tablet Chew 81 mg by mouth daily.  . B Complex-C (B-COMPLEX WITH VITAMIN C) tablet Take 1 tablet by mouth daily.  . calcium citrate-vitamin D (CITRACAL+D) 315-200 MG-UNIT per tablet Take 1 tablet by mouth daily.  . carboxymethylcellulose (REFRESH TEARS) 0.5 % SOLN Place 1 drop into both eyes 2 (two) times daily.  . clindamycin (CLEOCIN) 300 MG capsule Take 60 mg by mouth as needed.  . clotrimazole-betamethasone (LOTRISONE) cream Apply 1 application topically 2 (two) times daily.  Marland Kitchen docusate sodium (COLACE) 100 MG capsule Take 200 mg by mouth daily.   . fexofenadine (ALLEGRA) 60 MG tablet Take 60 mg by mouth daily.  . furosemide (LASIX) 20 MG tablet Take 1 tablet by mouth daily.  Marland Kitchen ipratropium-albuterol (DUONEB) 0.5-2.5 (3) MG/3ML SOLN Take 3 mLs by nebulization 2 (two) times daily.   Marland Kitchen levothyroxine (SYNTHROID, LEVOTHROID) 150 MCG tablet Take 150 mcg by mouth daily.   . magnesium hydroxide (MILK OF MAGNESIA) 400 MG/5ML suspension Take 30 mLs by mouth daily as needed for mild constipation.  . Melatonin 3 MG TABS Take 3 mg by mouth at  bedtime.   . Multiple Vitamin (MULTIVITAMIN WITH MINERALS) TABS tablet Take 1 tablet by mouth daily.  . potassium chloride (K-DUR,KLOR-CON) 10 MEQ tablet Take 10 mEq by mouth daily.   . Sodium Fluoride (PREVIDENT 5000 BOOSTER) 1.1 % PSTE Place onto teeth at bedtime. Brush teeth with a toothbrush after PM care. Spit out excess and do not rinse.   No facility-administered encounter medications on file as of 02/02/2019.    ROS was provided with assistance of staff.  Review of Systems  Constitutional: Negative for activity change, appetite change, chills, diaphoresis, fatigue, fever and unexpected weight change.  HENT: Positive for hearing loss. Negative for congestion and voice change.   Eyes: Positive for visual disturbance.  Respiratory: Negative for cough, shortness of breath and wheezing.   Cardiovascular: Positive for leg swelling. Negative for chest pain and palpitations.  Gastrointestinal: Negative for abdominal distention, abdominal pain, constipation, diarrhea, nausea and vomiting.  Genitourinary: Negative for difficulty urinating, dysuria and urgency.  Musculoskeletal: Positive for arthralgias, back  pain and gait problem.  Skin: Positive for color change. Negative for pallor.       BLE venous insufficiency skin changes.   Neurological: Negative for dizziness, speech difficulty, weakness and headaches.       Memory lapses.   Psychiatric/Behavioral: Negative for behavioral problems, hallucinations and sleep disturbance. The patient is not nervous/anxious.     Immunization History  Administered Date(s) Administered  . Influenza Whole 10/19/2012, 09/15/2018  . Influenza-Unspecified 10/18/2013, 10/17/2014, 09/02/2015, 09/29/2016, 10/03/2017  . PPD Test 01/22/2013, 11/28/2015  . Pneumococcal Conjugate-13 11/18/2017  . Pneumococcal Polysaccharide-23 01/22/2013  . Td 02/21/2009  . Tdap 04/07/2015   Pertinent  Health Maintenance Due  Topic Date Due  . INFLUENZA VACCINE  Completed  .  PNA vac Low Risk Adult  Completed   Fall Risk  09/05/2018 09/02/2017 11/14/2015 04/17/2015 09/26/2014  Falls in the past year? Yes No No Yes No  Number falls in past yr: 2 or more - - 1 -  Injury with Fall? Yes - - Yes -  Comment - - - laceration forehead 04/07/15 -  Risk Factor Category  - - - High Fall Risk -  Risk for fall due to : - - Impaired mobility;Impaired vision - -   Functional Status Survey:    Vitals:   02/02/19 1156  BP: 136/74  Pulse: 81  Resp: 19  Temp: (!) 97.5 F (36.4 C)  SpO2: 99%  Weight: 158 lb (71.7 kg)   Body mass index is 28.9 kg/m. Physical Exam Constitutional:      General: She is not in acute distress.    Appearance: Normal appearance. She is not ill-appearing, toxic-appearing or diaphoretic.     Comments: Over weight.   HENT:     Head: Normocephalic and atraumatic.     Nose: Nose normal.     Mouth/Throat:     Mouth: Mucous membranes are moist.  Eyes:     Extraocular Movements: Extraocular movements intact.     Pupils: Pupils are equal, round, and reactive to light.  Cardiovascular:     Rate and Rhythm: Normal rate and regular rhythm.     Heart sounds: No murmur.  Pulmonary:     Effort: Pulmonary effort is normal.     Breath sounds: No wheezing, rhonchi or rales.  Abdominal:     General: Bowel sounds are normal. There is no distension.     Palpations: Abdomen is soft.     Tenderness: There is no abdominal tenderness. There is no guarding or rebound.  Musculoskeletal:     Right lower leg: Edema present.     Left lower leg: Edema present.     Comments: Ambulates with walker, w/c to go further.   Skin:    General: Skin is warm and dry.     Comments: Mild erythematous BLE form chronic venous insufficiency skin changes.   Neurological:     General: No focal deficit present.     Mental Status: She is alert. Mental status is at baseline.     Cranial Nerves: No cranial nerve deficit.     Motor: No weakness.     Coordination: Coordination  normal.     Gait: Gait abnormal.     Comments: Oriented to person and place.   Psychiatric:        Mood and Affect: Mood normal.        Behavior: Behavior normal.     Labs reviewed: Recent Labs    08/17/18 1539 09/21/18 01/18/19  NA  138 137 139  K 4.2 4.1 4.0  CL 100 100 104  CO2 26 30 29   GLUCOSE 112*  --   --   BUN 27* 33* 36*  CREATININE 0.98 1.0 0.9  CALCIUM 9.4 9.0 8.8   Recent Labs    08/17/18 1539 09/21/18 01/18/19  AST 26 27 17   ALT 19 30 14   ALKPHOS 60 60 54  BILITOT 0.4  --   --   PROT 6.4* 5.8 5.9  ALBUMIN 3.4* .3.5 3.6   Recent Labs    08/17/18 1539 09/12/18 09/21/18 01/18/19  WBC 8.8 7.7 6.1 6.7  NEUTROABS 6.3  --   --   --   HGB 14.7 13.5 13.8 13.6  HCT 46.3* 41 42 41  MCV 92.2  --   --   --   PLT 185 198 187 190   Lab Results  Component Value Date   TSH 1.54 09/12/2018   No results found for: HGBA1C No results found for: CHOL, HDL, LDLCALC, LDLDIRECT, TRIG, CHOLHDL  Significant Diagnostic Results in last 30 days:  No results found.  Assessment/Plan COPD, mild (HCC) Stable, continue DuoNeb bid.   Hypothyroidism Stable, last TSH wnl 1.54 09/12/18, continue Levothyroxine 159mg qd.   Arthralgia Pain mainly in lower back and knees, controlled, continue Tylenol 10064mbid, w/c for mobility.   Constipation Stable, continue Colace 2006md, prn daily MOM 69m58mEdema Chronic, stable weights, continue Furosemide 20mg57m      Family/ staff Communication: plan of care reviewed with the patient and charge nurse.   Labs/tests ordered:  none  Time spend 25 minutes.

## 2019-02-02 NOTE — Assessment & Plan Note (Signed)
Chronic, stable weights, continue Furosemide 20mg qd.  

## 2019-02-02 NOTE — Assessment & Plan Note (Signed)
Stable, last TSH wnl 1.54 09/12/18, continue Levothyroxine qd.

## 2019-02-02 NOTE — Assessment & Plan Note (Signed)
Stable, continue Colace 200mg  qd, prn daily MOM 67ml

## 2019-03-21 ENCOUNTER — Encounter: Payer: Self-pay | Admitting: Internal Medicine

## 2019-03-21 ENCOUNTER — Non-Acute Institutional Stay (SKILLED_NURSING_FACILITY): Payer: Medicare Other | Admitting: Internal Medicine

## 2019-03-21 DIAGNOSIS — I872 Venous insufficiency (chronic) (peripheral): Secondary | ICD-10-CM | POA: Insufficient documentation

## 2019-03-21 DIAGNOSIS — J449 Chronic obstructive pulmonary disease, unspecified: Secondary | ICD-10-CM | POA: Diagnosis not present

## 2019-03-21 DIAGNOSIS — E039 Hypothyroidism, unspecified: Secondary | ICD-10-CM

## 2019-03-21 DIAGNOSIS — R2681 Unsteadiness on feet: Secondary | ICD-10-CM

## 2019-03-21 DIAGNOSIS — I10 Essential (primary) hypertension: Secondary | ICD-10-CM | POA: Diagnosis not present

## 2019-03-21 DIAGNOSIS — R600 Localized edema: Secondary | ICD-10-CM | POA: Diagnosis not present

## 2019-03-21 NOTE — Progress Notes (Signed)
Provider:  Veleta Miners  MD Location:  [facility] Room Number: 28 Place of Service:  SNF [phone])  PCP: [name], MD Patient Care Team: [name], MD as PCP - General (Internal Medicine) [place], [place], [facility] Mast, Man X, NP as Nurse Practitioner (Nurse Practitioner)  Extended Emergency Contact Information Primary Emergency Contact: [name] [place] Phone: [phone] Relation: Son Secondary Emergency Contact: [place] Phone: [phone] Mobile Phone: [phone] Relation: Daughter  Code Status: DNR Goals of Care: Advanced Directive information Advanced Directives 03/21/2019  Does Patient Have a Medical Advance Directive? Yes  Type of Paramedic of [place];Living will;Out of facility DNR (pink MOST or yellow form)  Does patient want to make changes to medical advance directive? No - Patient declined  Copy of [place] in Chart? Yes - validated most recent copy scanned in chart (See row information)  Would patient like information on creating a medical advance directive? -  Pre-existing out of facility DNR order (yellow form or pink MOST form) Yellow form placed in chart (order not valid for inpatient use);Pink MOST form placed in chart (order not valid for inpatient use)      Chief Complaint  Patient presents with   New Admit To SNF    HPI: Patient is a 85 y.o. female seen today for admission to SNF for Long term Care. Patient has h/o Bilateral LE edema , Hypertension, Hard of hearing, h/o Cellulitis, Hypothyroidism, COPD She is transferred from AL to Skilled facility for higher level of care Patient is not very happy in this new place. She says she has lived long enough now. She sleeps in Recliner all the time. Does not come out of her Room. Denies any Cough or SOB. Appetite seems to be good. Sleeps a lot per  nurses. .    Past Medical History:  Diagnosis Date   Abnormal liver function tests    11/11/15 AST 31, ALT 88, alk phos 96    Acute kidney failure, unspecified ([place]) 01/24/2012   Arthralgia 09/26/2014   Multiple joints: knees, shoulders, wrists, spine hips    Arthritis    Candidiasis of other urogenital sites 08/10/2012   Cerebral embolism 05/23/2005   CHF (congestive heart failure) ([place])    Cholelithiasis 11/04/2015   Closed fracture of sacrum and coccyx without mention of spinal cord injury 02/14/2012   Constipation 05/03/2013   Contusion of face, scalp, and neck except eye(s) 06/01/2012   Contusion of wrist 06/01/2012   COPD (chronic obstructive pulmonary disease) (HCC)    COPD, mild ([place]) 01/20/2013   Edema 04/20/2012   H/O: CVA (cerebrovascular accident)    Hearing loss 09/26/2014   History of cancer of uterus    HTN (hypertension), benign    Hyperglycemia 11/14/2015   Hypothyroidism    Insomnia, unspecified 08/10/2012   Open wound of knee, leg (except thigh), and ankle, without mention of complication 7/0/7867   Other and unspecified hyperlipidemia 06/01/2012   Other disorder of coccyx 01/27/2012   Pain in joint, ankle and foot 06/14/2012   Peripheral vascular disease, unspecified ([place]) 01/27/2012   Personal history of fall 01/27/2012   Pneumonia, organism unspecified(486) 01/23/2005   Rheumatic fever 09/26/1933   Age 27 10/01/14 ESR 13, RAF <10    Seasonal allergies 05/03/2013   Stasis dermatitis of both legs 09/26/2014   Unspecified constipation 11/02/2012   Unspecified hearing loss 02/08/2013   Unspecified hereditary and idiopathic  peripheral neuropathy 01/27/2012   Urinary frequency 02/24/2014   Ventricular fibrillation ([place]) 01/24/2012   Past Surgical History:  Procedure Laterality Date   ABDOMINAL HYSTERECTOMY  1990   for endometrial cancer   CATARACT EXTRACTION W/ INTRAOCULAR LENS  IMPLANT, BILATERAL     ECTOPIC PREGNANCY SURGERY  1952    GUM SURGERY  1932   MASTOIDECTOMY  1920   bilateral   THYROIDECTOMY  1960   TONSILLECTOMY  1916    reports that she has never smoked. She has never used smokeless tobacco. She reports that she does not drink alcohol or use drugs. Social History   Socioeconomic History   Marital status: Widowed    Spouse name: Not on file   Number of children: Not on file   Years of education: Not on file   Highest education level: Not on file  Occupational History   Occupation: retired Scientist, research (physical sciences) strain: Not hard at all   Food insecurity:    Worry: Never true    Inability: Never true   Transportation needs:    Medical: No    Non-medical: No  Tobacco Use   Smoking status: Never Smoker   Smokeless tobacco: Never Used  Substance and Sexual Activity   Alcohol use: No   Drug use: No   Sexual activity: Never  Lifestyle   Physical activity:    Days per week: 0 days    Minutes per session: 0 min   Stress: To some extent  Relationships   Social connections:    Talks on phone: Not on file    Gets together: Not on file    Attends religious service: Never    Active member of club or organization: No    Attends meetings of clubs or organizations: Never    Relationship status: Widowed   Intimate partner violence:    Fear of current or ex partner: No    Emotionally abused: No    Physically abused: No    Forced sexual activity: No  Other Topics Concern   Not on file  Social History Narrative   Lives at [facility] since2/07/2009   Widowed   Never smoked   Alcohol none   Exercise chair exercise   Walks with walker   DNR, POA, Living Will    Functional Status Survey:    Family History  Problem Relation Age of Onset   Stroke Mother    Heart disease Father     Health Maintenance  Topic Date Due   INFLUENZA VACCINE  07/14/2019   TETANUS/TDAP  04/06/2025   PNA vac Low Risk Adult  Completed    Allergies   Allergen Reactions   Amoxicillin     Unknown, patient unable to answer questionnaire    Aspirin Other (See Comments)    On MAR   Avelox [Moxifloxacin]     Unknown: listed on MAR   Erythromycin     Unknown: listed on MAR   Monistat [Miconazole]     Unknown: listed on MAR   Morphine And Related     Unknown: listed on MAR   Orange Juice [Orange Oil]     Unknown: listed on [facility]    Outpatient Encounter Medications as of 03/21/2019  Medication Sig   aluminum-magnesium hydroxide-simethicone (MAALOX) 595-638-75 MG/5ML SUSP Take 30 mLs by mouth every 6 (six) hours as needed.   aspirin 81 MG chewable tablet Chew 81 mg by mouth daily.   calcium citrate-vitamin D (  CITRACAL+D) 315-200 MG-UNIT per tablet Take 1 tablet by mouth daily.   carboxymethylcellulose (REFRESH TEARS) 0.5 % SOLN Place 1 drop into both eyes 2 (two) times daily.   docusate sodium (COLACE) 100 MG capsule Take 200 mg by mouth daily.    fexofenadine (ALLEGRA) 60 MG tablet Take 60 mg by mouth daily.   furosemide (LASIX) 20 MG tablet Take 1 tablet by mouth daily.   ipratropium-albuterol (DUONEB) 0.5-2.5 (3) MG/3ML SOLN Take 3 mLs by nebulization 2 (two) times daily.    levothyroxine (SYNTHROID, LEVOTHROID) 150 MCG tablet Take 150 mcg by mouth daily.    magnesium hydroxide (MILK OF MAGNESIA) 400 MG/5ML suspension Take 30 mLs by mouth daily as needed for mild constipation.   Melatonin 3 MG TABS Take 3 mg by mouth at bedtime.    Multiple Vitamin (MULTIVITAMIN WITH MINERALS) TABS tablet Take 1 tablet by mouth daily.   potassium chloride (K-DUR,KLOR-CON) 10 MEQ tablet Take 10 mEq by mouth daily.    white petrolatum (VASELINE) GEL Apply 1 application topically daily. Apply to bilateral feet for dry scaly skin.   zinc oxide 20 % ointment Apply 1 application topically as needed for irritation (To buttocks after every incontinent episode and for redness.).   [DISCONTINUED] acetaminophen (TYLENOL) 325 MG tablet Take  650 mg by mouth every 8 (eight) hours.    [DISCONTINUED] acetaminophen (TYLENOL) 500 MG tablet Take 1,000 mg by mouth 2 (two) times daily.    [DISCONTINUED] B Complex-C (B-COMPLEX WITH VITAMIN C) tablet Take 1 tablet by mouth daily.   [DISCONTINUED] clindamycin (CLEOCIN) 300 MG capsule Take 60 mg by mouth as needed.   [DISCONTINUED] clotrimazole-betamethasone (LOTRISONE) cream Apply 1 application topically 2 (two) times daily.   [DISCONTINUED] Sodium Fluoride (PREVIDENT 5000 BOOSTER) 1.1 % PSTE Place onto teeth at bedtime. Brush teeth with a toothbrush after PM care. Spit out excess and do not rinse.   No facility-administered encounter medications on file as of 03/21/2019.     Review of Systems  Constitutional: Positive for activity change. Negative for appetite change.  HENT: Negative.   Respiratory: Negative.   Cardiovascular: Positive for leg swelling.  Gastrointestinal: Negative.   Genitourinary: Negative.   Musculoskeletal: Negative.   Skin: Positive for color change.  Neurological: Negative.   Psychiatric/Behavioral: Positive for confusion and dysphoric mood.    Vitals:   03/21/19 0813  BP: 140/66  Pulse: 78  Resp: 19  Temp: (!) 96.8 F (36 C)  SpO2: 93%  Weight: 156 lb 3.2 oz (70.9 kg)  Height: 5' (1.524 m)   Body mass index is 30.51 kg/m. Physical Exam Vitals signs reviewed.  HENT:     Head: Normocephalic.     Nose: Nose normal.     Mouth/Throat:     Mouth: Mucous membranes are moist.     Pharynx: Oropharynx is clear.  Eyes:     Pupils: Pupils are equal, round, and reactive to light.  Neck:     Musculoskeletal: Neck supple.  Cardiovascular:     Rate and Rhythm: Normal rate and regular rhythm.  Pulmonary:     Effort: Pulmonary effort is normal. No respiratory distress.     Breath sounds: Normal breath sounds. No wheezing or rales.  Abdominal:     General: Abdomen is flat. Bowel sounds are normal. There is no distension.     Palpations: Abdomen is  soft.     Tenderness: There is no guarding.  Musculoskeletal:     Comments: Has moderate Edema Bilateral and Some  redness around her Ankles  Skin:    General: Skin is warm and dry.     Comments: Patient has Stage 1 pressure ulcer in her Buttocks  Neurological:     General: No focal deficit present.     Mental Status: She is alert and oriented to person, place, and time.     Comments: Very Hard of hearing. Per Nurses walks with the walker with Mild assist but mostly stays in her Recliner  Psychiatric:        Mood and Affect: Mood normal.        Thought Content: Thought content normal.        Judgment: Judgment normal.     Labs reviewed: Basic Metabolic Panel: Recent Labs    08/17/18 1539 09/21/18 01/18/19  NA 138 137 139  K 4.2 4.1 4.0  CL 100 100 104  CO2 _0 GLUCOSE 112*  --   --   BUN 27* 33* 36*  CREATININE 0.98 1.0 0.9  CALCIUM 9.4 9.0 8.8   Liver Function Tests: Recent Labs    08/17/18 1539 09/21/18 01/18/19  AST _1 ALT _2 ALKPHOS 60 60 54  BILITOT 0.4  --   --   PROT 6.4* 5.8 5.9  ALBUMIN 3.4* .3.5 3.6   No results for input(s): LIPASE, AMYLASE in the last 8760 hours. No results for input(s): AMMONIA in the last 8760 hours. CBC: Recent Labs    08/17/18 1539 09/12/18 09/21/18 01/18/19  WBC 8.8 7.7 6.1 6.7  NEUTROABS 6.3  --   --   --   HGB 14.7 13.5 13.8 13.6  HCT 46.3* 41 42 41  MCV 92.2  --   --   --   PLT 185 198 187 190   Cardiac Enzymes: No results for input(s): CKTOTAL, CKMB, CKMBINDEX, TROPONINI in the last 8760 hours. BNP: Invalid input(s): POCBNP No results found for: HGBA1C Lab Results  Component Value Date   TSH 1.54 09/12/2018   No results found for: VITAMINB12 No results found for: FOLATE No results found for: IRON, TIBC, FERRITIN  Imaging and Procedures obtained prior to SNF admission: Ct Head Wo Contrast  Result Date: 08/17/2018 CLINICAL DATA:  85 year old female status post fall with neck pain, initial  evaluation EXAM: CT HEAD WITHOUT CONTRAST CT CERVICAL SPINE WITHOUT CONTRAST TECHNIQUE: Multidetector CT imaging of the head and cervical spine was performed following the standard protocol without intravenous contrast. Multiplanar CT image reconstructions of the cervical spine were also generated. COMPARISON:  Most recent prior CT scan of the head and cervical spine 12/26/2017; prior head CT 06/19/2012 FINDINGS: CT HEAD FINDINGS Brain: No evidence of acute infarction, hemorrhage, hydrocephalus, extra-axial collection or mass lesion/mass effect. Vascular: No hyperdense vessel sign. Atherosclerotic calcifications in the bilateral cavernous carotid arteries. Skull: No acute fracture or lytic or blastic osseous lesion. Hyperostosis frontalis interna present bilaterally. Sinuses/Orbits: No acute finding. Other: Right forehead hematoma. Small 8 mm soft tissue nodule in the left parotid gland demonstrates no significant interval change compared to fairly remote prior imaging from 06/19/2012. Six year stability is highly suggestive of benignity. CT CERVICAL SPINE FINDINGS Alignment: Normal. Skull base and vertebrae: No acute fracture. No primary bone lesion or focal pathologic process. Soft tissues and spinal canal: No prevertebral fluid or swelling. No visible canal hematoma. Disc levels: Multilevel degenerative disc disease. Prominent calcified posterior disc osteophyte complex at C2-C3. Additional levels of posterior disc osteophyte complex include C4-C5, C5-C6 and C6-C7.  Ankylosis of the right C3-C4 facets. Upper chest: Negative. Other: None. IMPRESSION: CT HEAD 1. No acute intracranial abnormality. 2. Moderately large right forehead hematoma without evidence of underlying calvarial fracture. 3. Small left parotid nodule remains stable dating back to 06/19/2012. CT CSPINE 1. No acute fracture or malalignment. 2. Multilevel degenerative disc disease and facet arthropathy. Electronically Signed   By: Jacqulynn Cadet  M.D.   On: 08/17/2018 16:19   Ct Cervical Spine Wo Contrast  Result Date: 08/17/2018 CLINICAL DATA:  85 year old female status post fall with neck pain, initial evaluation EXAM: CT HEAD WITHOUT CONTRAST CT CERVICAL SPINE WITHOUT CONTRAST TECHNIQUE: Multidetector CT imaging of the head and cervical spine was performed following the standard protocol without intravenous contrast. Multiplanar CT image reconstructions of the cervical spine were also generated. COMPARISON:  Most recent prior CT scan of the head and cervical spine 12/26/2017; prior head CT 06/19/2012 FINDINGS: CT HEAD FINDINGS Brain: No evidence of acute infarction, hemorrhage, hydrocephalus, extra-axial collection or mass lesion/mass effect. Vascular: No hyperdense vessel sign. Atherosclerotic calcifications in the bilateral cavernous carotid arteries. Skull: No acute fracture or lytic or blastic osseous lesion. Hyperostosis frontalis interna present bilaterally. Sinuses/Orbits: No acute finding. Other: Right forehead hematoma. Small 8 mm soft tissue nodule in the left parotid gland demonstrates no significant interval change compared to fairly remote prior imaging from 06/19/2012. Six year stability is highly suggestive of benignity. CT CERVICAL SPINE FINDINGS Alignment: Normal. Skull base and vertebrae: No acute fracture. No primary bone lesion or focal pathologic process. Soft tissues and spinal canal: No prevertebral fluid or swelling. No visible canal hematoma. Disc levels: Multilevel degenerative disc disease. Prominent calcified posterior disc osteophyte complex at C2-C3. Additional levels of posterior disc osteophyte complex include C4-C5, C5-C6 and C6-C7. Ankylosis of the right C3-C4 facets. Upper chest: Negative. Other: None. IMPRESSION: CT HEAD 1. No acute intracranial abnormality. 2. Moderately large right forehead hematoma without evidence of underlying calvarial fracture. 3. Small left parotid nodule remains stable dating back to  06/19/2012. CT CSPINE 1. No acute fracture or malalignment. 2. Multilevel degenerative disc disease and facet arthropathy. Electronically Signed   By: Jacqulynn Cadet M.D.   On: 08/17/2018 16:19    Assessment/Plan  Essential hypertension BP stable on Lasix only  Bilateral leg edema Continue on lasix Will follow [facility] facility Consider increasing diuretics BUN and Creat are stable COPD, mild  No Symptoms Stable Hypothyroidism Last TSH in 09/19  Gait instability She is now requiring Higher level of care. Patient sleeps in her recliner most of the time. She told me she is tired. Though right now she is eating and stable.     Family/ staff Communication:   Labs/tests ordered: Total time spent in this patient care encounter was 45_ minutes; greater than 50% of the visit spent counseling patient, reviewing records , Labs and coordinating care for problems addressed at this encounter.

## 2019-04-06 ENCOUNTER — Encounter: Payer: Self-pay | Admitting: Internal Medicine

## 2019-04-06 ENCOUNTER — Non-Acute Institutional Stay (SKILLED_NURSING_FACILITY): Payer: Medicare Other | Admitting: Internal Medicine

## 2019-04-06 DIAGNOSIS — L89151 Pressure ulcer of sacral region, stage 1: Secondary | ICD-10-CM | POA: Diagnosis not present

## 2019-04-06 DIAGNOSIS — H9193 Unspecified hearing loss, bilateral: Secondary | ICD-10-CM

## 2019-04-06 DIAGNOSIS — R600 Localized edema: Secondary | ICD-10-CM

## 2019-04-06 NOTE — Progress Notes (Signed)
Location:  [facility] Room Number: 28 Place of Service:  SNF [phone]) Provider:  Veleta Miners  MD   [name], MD  Patient Care Team: [name], MD as PCP - General (Internal Medicine) [place] [place] Mast, Man X, NP as Nurse Practitioner (Nurse Practitioner)  Extended Emergency Contact Information Primary Emergency Contact: [name] States of [place] Phone: [phone] Relation: Son Secondary Emergency Contact: [place] Phone: [phone] Mobile Phone: [phone] Relation: Daughter  Code Status: DNR Goals of care: Advanced Directive information Advanced Directives 03/21/2019  Does Patient Have a Medical Advance Directive? Yes  Type of Paramedic of [place];Living will;Out of facility DNR (pink MOST or yellow form)  Does patient want to make changes to medical advance directive? No - Patient declined  Copy of [place] in Chart? Yes - validated most recent copy scanned in chart (See row information)  Would patient like information on creating a medical advance directive? -  Pre-existing out of facility DNR order (yellow form or pink MOST form) Yellow form placed in chart (order not valid for inpatient use);Pink MOST form placed in chart (order not valid for inpatient use)     Chief Complaint  Patient presents with  . Acute Visit    C/o- pressure wound    HPI:  Pt is a 85 y.o. female seen today for an acute visit for Pressure wound Patient has h/o Bilateral LE edema , Hypertension, Hard of hearing, h/o Cellulitis, Hypothyroidism, COPD She is Recent admit to SNF Nurses wanted me to see her for pressure wound. Patient sits on her recliner all the time. Does not  come out of her room Has no bed in her room. Very hard of hearing. Denied any pain in her bottom.  Was more alert today   Past Medical History:  Diagnosis Date  .  Abnormal liver function tests    11/11/15 AST 31, ALT 88, alk phos 96   . Acute kidney failure, unspecified ([place]) 01/24/2012  . Arthralgia 09/26/2014   Multiple joints: knees, shoulders, wrists, spine hips   . Arthritis   . Candidiasis of other urogenital sites 08/10/2012  . Cerebral embolism 05/23/2005  . CHF (congestive heart failure) ([place])   . Cholelithiasis 11/04/2015  . Closed fracture of sacrum and coccyx without mention of spinal cord injury 02/14/2012  . Constipation 05/03/2013  . Contusion of face, scalp, and neck except eye(s) 06/01/2012  . Contusion of wrist 06/01/2012  . COPD (chronic obstructive pulmonary disease) ([place])   . COPD, mild ([place]) 01/20/2013  . Edema 04/20/2012  . H/O: CVA (cerebrovascular accident)   . Hearing loss 09/26/2014  . History of cancer of uterus   . HTN (hypertension), benign   . Hyperglycemia 11/14/2015  . Hypothyroidism   . Insomnia, unspecified 08/10/2012  . Open wound of knee, leg (except thigh), and ankle, without mention of complication 05/15/7857  . Other and unspecified hyperlipidemia 06/01/2012  . Other disorder of coccyx 01/27/2012  . Pain in joint, ankle and foot 06/14/2012  . Peripheral vascular disease, unspecified ([place]) 01/27/2012  . Personal history of fall 01/27/2012  . Pneumonia, organism unspecified(486) 01/23/2005  . Rheumatic fever 09/26/1933   Age 59 10/01/14 ESR 13, RAF <10   . Seasonal allergies 05/03/2013  . Stasis dermatitis of both legs 09/26/2014  . Unspecified constipation 11/02/2012  . Unspecified hearing loss 02/08/2013  . Unspecified hereditary and idiopathic peripheral neuropathy 01/27/2012  .  Urinary frequency 02/24/2014  . Ventricular fibrillation ([place]) 01/24/2012   Past Surgical History:  Procedure Laterality Date  . ABDOMINAL HYSTERECTOMY  1990   for endometrial cancer  . CATARACT EXTRACTION W/ INTRAOCULAR LENS  IMPLANT, BILATERAL    . [place]  . GUM SURGERY  1932  . MASTOIDECTOMY  1920   bilateral  .  THYROIDECTOMY  1960  . TONSILLECTOMY  1916    Allergies  Allergen Reactions  . Amoxicillin     Unknown, patient unable to answer questionnaire   . Aspirin Other (See Comments)    On MAR  . Avelox [Moxifloxacin]     Unknown: listed on MAR  . Erythromycin     Unknown: listed on MAR  . Monistat [Miconazole]     Unknown: listed on MAR  . Morphine And Related     Unknown: listed on MAR  . Orange Juice [Orange Oil]     Unknown: listed on [facility]    Outpatient Encounter Medications as of 04/06/2019  Medication Sig  . acetaminophen (TYLENOL) 325 MG tablet Take 650 mg by mouth every 4 (four) hours as needed.  Marland Kitchen aluminum-magnesium hydroxide-simethicone (MAALOX) 427-062-37 MG/5ML SUSP Take 30 mLs by mouth every 6 (six) hours as needed.  Marland Kitchen aspirin 81 MG chewable tablet Chew 81 mg by mouth daily.  . calcium citrate-vitamin D (CITRACAL+D) 315-200 MG-UNIT per tablet Take 1 tablet by mouth daily.  . carboxymethylcellulose (REFRESH TEARS) 0.5 % SOLN Place 1 drop into both eyes 2 (two) times daily.  Marland Kitchen docusate sodium (COLACE) 100 MG capsule Take 100 mg by mouth daily.   . fexofenadine (ALLEGRA) 60 MG tablet Take 60 mg by mouth daily.  . furosemide (LASIX) 20 MG tablet Take 1 tablet by mouth daily.  Marland Kitchen ipratropium-albuterol (DUONEB) 0.5-2.5 (3) MG/3ML SOLN Take 3 mLs by nebulization 2 (two) times daily.   Marland Kitchen levothyroxine (SYNTHROID, LEVOTHROID) 150 MCG tablet Take 150 mcg by mouth daily.   . magnesium hydroxide (MILK OF MAGNESIA) 400 MG/5ML suspension Take 30 mLs by mouth daily as needed for mild constipation.  . Melatonin 3 MG TABS Take 3 mg by mouth at bedtime.   . Multiple Vitamin (MULTIVITAMIN WITH MINERALS) TABS tablet Take 1 tablet by mouth daily.  . potassium chloride (K-DUR,KLOR-CON) 10 MEQ tablet Take 10 mEq by mouth daily.   . white petrolatum (VASELINE) GEL Apply 1 application topically daily. Apply to bilateral feet for dry scaly skin.  Marland Kitchen zinc oxide 20 % ointment Apply 1 application  topically as needed for irritation (To buttocks after every incontinent episode and for redness.).   No facility-administered encounter medications on file as of 04/06/2019.     Review of Systems  Respiratory: Negative for cough and shortness of breath.   Cardiovascular: Positive for leg swelling.  Gastrointestinal: Negative.   Genitourinary: Negative.   Musculoskeletal: Negative.   Skin: Positive for wound.  Neurological: Negative.     Immunization History  Administered Date(s) Administered  . Influenza Whole 10/19/2012, 09/15/2018  . Influenza-Unspecified 10/18/2013, 10/17/2014, 09/02/2015, 09/29/2016, 10/03/2017  . PPD Test 01/22/2013, 11/28/2015  . Pneumococcal Conjugate-13 11/18/2017  . Pneumococcal Polysaccharide-23 01/22/2013  . Td 02/21/2009  . Tdap 04/07/2015   Pertinent  Health Maintenance Due  Topic Date Due  . INFLUENZA VACCINE  07/14/2019  . PNA vac Low Risk Adult  Completed   Fall Risk  09/05/2018 09/02/2017 11/14/2015 04/17/2015 09/26/2014  Falls in the past year? Yes No No Yes No  Number falls in past  yr: 2 or more - - 1 -  Injury with Fall? Yes - - Yes -  Comment - - - laceration forehead 04/07/15 -  Risk Factor Category  - - - High Fall Risk -  Risk for fall due to : - - Impaired mobility;Impaired vision - -   Functional Status Survey:    Vitals:   04/06/19 1453  BP: 130/80  Pulse: 76  Resp: 18  Temp: (!) 97.2 F (36.2 C)  SpO2: 92%  Weight: 153 lb 11.2 oz (69.7 kg)  Height: 5' (1.524 m)   Body mass index is 30.02 kg/m. Physical Exam Constitutional:      Appearance: Normal appearance.  HENT:     Head: Normocephalic.     Nose: Nose normal.     Mouth/Throat:     Mouth: Mucous membranes are moist.     Pharynx: Oropharynx is clear.  Eyes:     Pupils: Pupils are equal, round, and reactive to light.  Cardiovascular:     Pulses: Normal pulses.     Heart sounds: Normal heart sounds.  Pulmonary:     Effort: Pulmonary effort is normal.     Breath  sounds: Normal breath sounds.  Abdominal:     General: Abdomen is flat. Bowel sounds are normal.     Palpations: Abdomen is soft.  Musculoskeletal:        General: Swelling present.  Skin:    Comments: Has redness in both Side of her Cheeks. No Opening just discoloration   Neurological:     Mental Status: She is alert.     Labs reviewed: Recent Labs    08/17/18 1539 09/21/18 01/18/19  NA 138 137 139  K 4.2 4.1 4.0  CL 100 100 104  CO2 _0 GLUCOSE 112*  --   --   BUN 27* 33* 36*  CREATININE 0.98 1.0 0.9  CALCIUM 9.4 9.0 8.8   Recent Labs    08/17/18 1539 09/21/18 01/18/19  AST _1 ALT _2 ALKPHOS 60 60 54  BILITOT 0.4  --   --   PROT 6.4* 5.8 5.9  ALBUMIN 3.4* .3.5 3.6   Recent Labs    08/17/18 1539 09/12/18 09/21/18 01/18/19  WBC 8.8 7.7 6.1 6.7  NEUTROABS 6.3  --   --   --   HGB 14.7 13.5 13.8 13.6  HCT 46.3* 41 42 41  MCV 92.2  --   --   --   PLT 185 198 187 190   Lab Results  Component Value Date   TSH 1.54 09/12/2018   No results found for: HGBA1C No results found for: CHOL, HDL, LDLCALC, LDLDIRECT, TRIG, CHOLHDL  Significant Diagnostic Results in last 30 days:  No results found.  Assessment/Plan Pressure Ulcer stage 1-2 D/W the Nurses. It is hard as patient sits on her recliner all day and sleep. She does not get up. Will try to see Cushion for her bottom and see if patient aggress to use it  Zinc for now. Will use Hydrocolloid if there is any Skin tear   Family/ staff Communication:   Labs/tests ordered:   Total time spent in this patient care encounter was  25_  minutes; greater than 50% of the visit spent counseling patient and staff, reviewing records , Labs and coordinating care for problems addressed at this encounter.

## 2019-04-07 DIAGNOSIS — L89151 Pressure ulcer of sacral region, stage 1: Secondary | ICD-10-CM | POA: Insufficient documentation

## 2019-04-16 ENCOUNTER — Non-Acute Institutional Stay (SKILLED_NURSING_FACILITY): Payer: Medicare Other | Admitting: Nurse Practitioner

## 2019-04-16 ENCOUNTER — Encounter: Payer: Self-pay | Admitting: Nurse Practitioner

## 2019-04-16 DIAGNOSIS — R2681 Unsteadiness on feet: Secondary | ICD-10-CM | POA: Diagnosis not present

## 2019-04-16 DIAGNOSIS — W19XXXA Unspecified fall, initial encounter: Secondary | ICD-10-CM | POA: Diagnosis not present

## 2019-04-16 DIAGNOSIS — R600 Localized edema: Secondary | ICD-10-CM

## 2019-04-16 DIAGNOSIS — E039 Hypothyroidism, unspecified: Secondary | ICD-10-CM | POA: Diagnosis not present

## 2019-04-16 NOTE — Assessment & Plan Note (Signed)
Risk for falling, continue close supervision for safety and care assistance, ambulates with walker for a short distance, w/c to go further.

## 2019-04-16 NOTE — Assessment & Plan Note (Signed)
Risk for falling, continue close supervision for safety.

## 2019-04-16 NOTE — Assessment & Plan Note (Signed)
Last TSH wnl, 1.54 10/19, continue Levothyroxine qd.

## 2019-04-16 NOTE — Assessment & Plan Note (Signed)
1+ edema BLE, continue Furosemide 20mg  qd.

## 2019-04-16 NOTE — Progress Notes (Signed)
Location:  [facility] Room Number: 28 Place of Service:  SNF (31) Provider:  Marlana Latus  NP  [name], MD  Patient Care Team: [name], MD as PCP - General (Internal Medicine) [place] [place] Harvie Morua X, NP as Nurse Practitioner (Nurse Practitioner)  Extended Emergency Contact Information Primary Emergency Contact: [name] States of [place] Phone: [phone] Relation: Son Secondary Emergency Contact: [place] Phone: [phone] Mobile Phone: [phone] Relation: Daughter  Code Status:  DNR Goals of care: Advanced Directive information Advanced Directives 04/16/2019  Does Patient Have a Medical Advance Directive? Yes  Type of Paramedic of [place];Living will;Out of facility DNR (pink MOST or yellow form)  Does patient want to make changes to medical advance directive? No - Patient declined  Copy of [place] in Chart? Yes - validated most recent copy scanned in chart (See row information)  Would patient like information on creating a medical advance directive? -  Pre-existing out of facility DNR order (yellow form or pink MOST form) Yellow form placed in chart (order not valid for inpatient use);Pink MOST form placed in chart (order not valid for inpatient use)     Chief Complaint  Patient presents with  . Medical Management of Chronic Issues    HPI:  Pt is a 85 y.o. female seen today for medical management of chronic diseases.     The patient resides in SNF [facility] for safety and care assistance, ambulates with walker, unsteady gait, last fall 04/15/19 when she pushed wrong lift chair button and lid to floor, no apparent injury. Hx of hypothyroidism, on Levothyroxine 182mg qd, last TSH 1.54 09/12/18..Marland KitchenBLE edema, 1+ edema in BLE, on Furosemide 245mqd.    Past Medical History:  Diagnosis Date  . Abnormal liver function tests     11/11/15 AST 31, ALT 88, alk phos 96   . Acute kidney failure, unspecified ([place]2/10/2012  . Arthralgia 09/26/2014   Multiple joints: knees, shoulders, wrists, spine hips   . Arthritis   . Candidiasis of other urogenital sites 08/10/2012  . Cerebral embolism 05/23/2005  . CHF (congestive heart failure) ([place]  . Cholelithiasis 11/04/2015  . Closed fracture of sacrum and coccyx without mention of spinal cord injury 02/14/2012  . Constipation 05/03/2013  . Contusion of face, scalp, and neck except eye(s) 06/01/2012  . Contusion of wrist 06/01/2012  . COPD (chronic obstructive pulmonary disease) ([place]  . COPD, mild ([facility]/07/2013  . Edema 04/20/2012  . H/O: CVA (cerebrovascular accident)   . Hearing loss 09/26/2014  . History of cancer of uterus   . HTN (hypertension), benign   . Hyperglycemia 11/14/2015  . Hypothyroidism   . Insomnia, unspecified 08/10/2012  . Open wound of knee, leg (except thigh), and ankle, without mention of complication 5/12/17/5206. Other and unspecified hyperlipidemia 06/01/2012  . Other disorder of coccyx 01/27/2012  . Pain in joint, ankle and foot 06/14/2012  . Peripheral vascular disease, unspecified ([facility]2/14/2013  . Personal history of fall 01/27/2012  . Pneumonia, organism unspecified(486) 01/23/2005  . Rheumatic fever 09/26/1933   Age 59 10/01/14 ESR 13, RAF <10   . Seasonal allergies 05/03/2013  . Stasis dermatitis of both legs 09/26/2014  . Unspecified constipation 11/02/2012  . Unspecified hearing loss 02/08/2013  . Unspecified hereditary and idiopathic peripheral neuropathy 01/27/2012  . Urinary frequency 02/24/2014  . Ventricular fibrillation ([facility]2/10/2012   Past  Surgical History:  Procedure Laterality Date  . ABDOMINAL HYSTERECTOMY  1990   for endometrial cancer  . CATARACT EXTRACTION W/ INTRAOCULAR LENS  IMPLANT, BILATERAL    . [place]  . GUM SURGERY  1932  . MASTOIDECTOMY  1920   bilateral  . THYROIDECTOMY  1960  .  TONSILLECTOMY  1916    Allergies  Allergen Reactions  . Amoxicillin     Unknown, patient unable to answer questionnaire   . Aspirin Other (See Comments)    On MAR  . Avelox [Moxifloxacin]     Unknown: listed on MAR  . Erythromycin     Unknown: listed on MAR  . Monistat [Miconazole]     Unknown: listed on MAR  . Morphine And Related     Unknown: listed on MAR  . Orange Juice [Orange Oil]     Unknown: [facility] on [facility]    Outpatient Encounter Medications as of 04/16/2019  Medication Sig  . aluminum-magnesium hydroxide-simethicone (MAALOX) 160-109-32 MG/5ML SUSP Take 30 mLs by mouth every 6 (six) hours as needed.  Marland Kitchen aspirin 81 MG chewable tablet Chew 81 mg by mouth daily.  . calcium citrate-vitamin D (CITRACAL+D) 315-200 MG-UNIT per tablet Take 1 tablet by mouth daily.  . carboxymethylcellulose (REFRESH TEARS) 0.5 % SOLN Place 1 drop into both eyes 2 (two) times daily.  Marland Kitchen docusate sodium (COLACE) 100 MG capsule Take 200 mg by mouth daily.   . fexofenadine (ALLEGRA) 60 MG tablet Take 60 mg by mouth daily.  . furosemide (LASIX) 20 MG tablet Take 1 tablet by mouth daily.  Marland Kitchen ipratropium-albuterol (DUONEB) 0.5-2.5 (3) MG/3ML SOLN Take 3 mLs by nebulization 2 (two) times daily.   Marland Kitchen levothyroxine (SYNTHROID, LEVOTHROID) 150 MCG tablet Take 150 mcg by mouth daily.   . magnesium hydroxide (MILK OF MAGNESIA) 400 MG/5ML suspension Take 30 mLs by mouth daily as needed for mild constipation.  . Melatonin 3 MG TABS Take 3 mg by mouth at bedtime.   . Multiple Vitamin (MULTIVITAMIN WITH MINERALS) TABS tablet Take 1 tablet by mouth daily.  . potassium chloride (K-DUR,KLOR-CON) 10 MEQ tablet Take 10 mEq by mouth daily.   . white petrolatum (VASELINE) GEL Apply 1 application topically daily. Apply to bilateral feet for dry scaly skin.  Marland Kitchen zinc oxide 20 % ointment Apply 1 application topically as needed for irritation (To buttocks after every incontinent episode and for redness.).   No facility-administered  encounter medications on file as of 04/16/2019.    ROS was provided with assistance of staff Review of Systems  Constitutional: Negative for appetite change, chills, diaphoresis, fatigue and fever.  HENT: Positive for hearing loss. Negative for congestion and voice change.   Eyes: Negative for visual disturbance.  Respiratory: Negative for cough and shortness of breath.   Cardiovascular: Positive for leg swelling. Negative for chest pain and palpitations.  Gastrointestinal: Negative for abdominal distention, abdominal pain, constipation, diarrhea, nausea and vomiting.  Genitourinary: Negative for difficulty urinating, dysuria and urgency.  Musculoskeletal: Positive for arthralgias and gait problem.  Skin: Negative for color change and pallor.  Neurological: Negative for dizziness, speech difficulty, weakness and headaches.       Memory lapses.   Psychiatric/Behavioral: Negative for agitation, behavioral problems, hallucinations and sleep disturbance. The patient is not nervous/anxious.     Immunization History  Administered Date(s) Administered  . Influenza Whole 10/19/2012, 09/15/2018  . Influenza-Unspecified 10/18/2013, 10/17/2014, 09/02/2015, 09/29/2016, 10/03/2017  . PPD Test 01/22/2013, 11/28/2015  . Pneumococcal Conjugate-13 11/18/2017  .  Pneumococcal Polysaccharide-23 01/22/2013  . Td 02/21/2009  . Tdap 04/07/2015   Pertinent  Health Maintenance Due  Topic Date Due  . INFLUENZA VACCINE  07/14/2019  . PNA vac Low Risk Adult  Completed   Fall Risk  09/05/2018 09/02/2017 11/14/2015 04/17/2015 09/26/2014  Falls in the past year? Yes No No Yes No  Number falls in past yr: 2 or more - - 1 -  Injury with Fall? Yes - - Yes -  Comment - - - laceration forehead 04/07/15 -  Risk Factor Category  - - - High Fall Risk -  Risk for fall due to : - - Impaired mobility;Impaired vision - -   Functional Status Survey:    Vitals:   04/16/19 1029  BP: 128/60  Pulse: 94  Resp: 20  Temp:  97.8 F (36.6 C)  SpO2: 90%  Weight: 153 lb 11.2 oz (69.7 kg)  Height: 5' (1.524 m)   Body mass index is 30.02 kg/m. Physical Exam Constitutional:      General: She is not in acute distress.    Appearance: Normal appearance. She is obese. She is not ill-appearing, toxic-appearing or diaphoretic.  HENT:     Head: Normocephalic and atraumatic.     Nose: Nose normal.     Mouth/Throat:     Mouth: Mucous membranes are moist.  Eyes:     Extraocular Movements: Extraocular movements intact.     Conjunctiva/sclera: Conjunctivae normal.     Pupils: Pupils are equal, round, and reactive to light.  Neck:     Musculoskeletal: Normal range of motion and neck supple.  Cardiovascular:     Rate and Rhythm: Normal rate and regular rhythm.     Heart sounds: No murmur.  Pulmonary:     Breath sounds: Rales present.     Comments: Bibasilar rales Abdominal:     General: There is no distension.     Palpations: Abdomen is soft.     Tenderness: There is no abdominal tenderness. There is no right CVA tenderness or left CVA tenderness.  Musculoskeletal:     Right lower leg: Edema present.     Left lower leg: Edema present.     Comments: 1+edema BLE, ambulates with walker.  Skin:    General: Skin is warm and dry.  Neurological:     General: No focal deficit present.     Mental Status: She is alert. Mental status is at baseline.     Cranial Nerves: No cranial nerve deficit.     Motor: No weakness.     Coordination: Coordination normal.     Gait: Gait abnormal.     Comments: Oriented to person and place.   Psychiatric:        Mood and Affect: Mood normal.        Behavior: Behavior normal.        Thought Content: Thought content normal.     Labs reviewed: Recent Labs    08/17/18 1539 09/21/18 01/18/19  NA 138 137 139  K 4.2 4.1 4.0  CL 100 100 104  CO2 26 30 29   GLUCOSE 112*  --   --   BUN 27* 33* 36*  CREATININE 0.98 1.0 0.9  CALCIUM 9.4 9.0 8.8   Recent Labs    08/17/18 1539  09/21/18 01/18/19  AST 26 27 17   ALT 19 30 14   ALKPHOS 60 60 54  BILITOT 0.4  --   --   PROT 6.4* 5.8 5.9  ALBUMIN 3.4* .3.5  3.6   Recent Labs    08/17/18 1539 09/12/18 09/21/18 01/18/19  WBC 8.8 7.7 6.1 6.7  NEUTROABS 6.3  --   --   --   HGB 14.7 13.5 13.8 13.6  HCT 46.3* 41 42 41  MCV 92.2  --   --   --   PLT 185 198 187 190   Lab Results  Component Value Date   TSH 1.54 09/12/2018   No results found for: HGBA1C No results found for: CHOL, HDL, LDLCALC, LDLDIRECT, TRIG, CHOLHDL  Significant Diagnostic Results in last 30 days:  No results found.  Assessment/Plan Hypothyroidism Last TSH wnl, 1.54 10/19, continue Levothyroxine 134mg qd.   Bilateral leg edema 1+ edema BLE, continue Furosemide 237mqd.   Gait instability Risk for falling, continue close supervision for safety and care assistance, ambulates with walker for a short distance, w/c to go further.   Fall Risk for falling, continue close supervision for safety.      Family/ staff Communication: plan of care reviewed with the patient and charge nurse.   Labs/tests ordered:  none  Time spend 25 minutes.

## 2019-04-30 ENCOUNTER — Encounter: Payer: Self-pay | Admitting: Nurse Practitioner

## 2019-04-30 ENCOUNTER — Non-Acute Institutional Stay (SKILLED_NURSING_FACILITY): Payer: Medicare Other | Admitting: Nurse Practitioner

## 2019-04-30 DIAGNOSIS — I739 Peripheral vascular disease, unspecified: Secondary | ICD-10-CM | POA: Diagnosis not present

## 2019-04-30 DIAGNOSIS — G5793 Unspecified mononeuropathy of bilateral lower limbs: Secondary | ICD-10-CM | POA: Diagnosis not present

## 2019-04-30 DIAGNOSIS — R600 Localized edema: Secondary | ICD-10-CM

## 2019-04-30 DIAGNOSIS — I872 Venous insufficiency (chronic) (peripheral): Secondary | ICD-10-CM | POA: Diagnosis not present

## 2019-04-30 NOTE — Assessment & Plan Note (Signed)
Chronic, red and scaly skin changes, worsened in the RLE, will apply 1% Hydrocortisone lotion nightly BLE x 2 weeks. Observe.

## 2019-04-30 NOTE — Progress Notes (Signed)
Location:   SNF [place] Room Number: 28/A Place of Service:  SNF (31) Provider: [facility] Aften Lipsey NP  [name], MD  Patient Care Team: [name], MD as PCP - General (Internal Medicine) [place] [place] Esbeydi Manago X, NP as Nurse Practitioner (Nurse Practitioner)  Extended Emergency Contact Information Primary Emergency Contact: [name] [place] Phone: [phone] Relation: Son Secondary Emergency Contact: [place] Phone: [phone] Mobile Phone: [phone] Relation: Daughter  Code Status: DNR Goals of care: Advanced Directive information Advanced Directives 04/30/2019  Does Patient Have a Medical Advance Directive? Yes  Type of Paramedic of [place];Out of facility DNR (pink MOST or yellow form)  Does patient want to make changes to medical advance directive? No - Patient declined  Copy of [place] in Chart? Yes - validated most recent copy scanned in chart (See row information)  Would patient like information on creating a medical advance directive? -  Pre-existing out of facility DNR order (yellow form or pink MOST form) Yellow form placed in chart (order not valid for inpatient use);Pink MOST form placed in chart (order not valid for inpatient use)     Chief Complaint  Patient presents with  . Acute Visit    Increased redness in lower leg     HPI:  Pt is a 85 y.o. female seen today for an acute visit for reported increased redness to right lower leg, swelling to fingers 04/29/19. The patient has chronic edema BLE, venous insufficiency skin changes-scaly redness, Hx of peripheral neuropathic pain in BLE/feet. No warmth or increased pain noted. No noted swelling in fingers upon my visit today. She denied cough, SOB, chest pain/pressure or palpitation. No open wounds in BLE. She is on Furosemide 64m qd.    Past Medical History:   Diagnosis Date  . Abnormal liver function tests    11/11/15 AST 31, ALT 88, alk phos 96   . Acute kidney failure, unspecified ([facility] 01/24/2012  . Arthralgia 09/26/2014   Multiple joints: knees, shoulders, wrists, spine hips   . Arthritis   . Candidiasis of other urogenital sites 08/10/2012  . Cerebral embolism 05/23/2005  . CHF (congestive heart failure) ([facility]   . Cholelithiasis 11/04/2015  . Closed fracture of sacrum and coccyx without mention of spinal cord injury 02/14/2012  . Constipation 05/03/2013  . Contusion of face, scalp, and neck except eye(s) 06/01/2012  . Contusion of wrist 06/01/2012  . COPD (chronic obstructive pulmonary disease) ([facility]   . COPD, mild ([facility] 01/20/2013  . Edema 04/20/2012  . H/O: CVA (cerebrovascular accident)   . Hearing loss 09/26/2014  . History of cancer of uterus   . HTN (hypertension), benign   . Hyperglycemia 11/14/2015  . Hypothyroidism   . Insomnia, unspecified 08/10/2012  . Open wound of knee, leg (except thigh), and ankle, without mention of complication 51/06/4080 . Other and unspecified hyperlipidemia 06/01/2012  . Other disorder of coccyx 01/27/2012  . Pain in joint, ankle and foot 06/14/2012  . Peripheral vascular disease, unspecified ([facility] 01/27/2012  . Personal history of fall 01/27/2012  . Pneumonia, organism unspecified(486) 01/23/2005  . Rheumatic fever 09/26/1933   Age 56 10/01/14 ESR 13, RAF <10   . Seasonal allergies 05/03/2013  . Stasis dermatitis of both legs 09/26/2014  . Unspecified constipation 11/02/2012  . Unspecified hearing loss 02/08/2013  . Unspecified hereditary and idiopathic peripheral neuropathy 01/27/2012  . Urinary frequency 02/24/2014  . Ventricular  fibrillation ([place]) 01/24/2012   Past Surgical History:  Procedure Laterality Date  . ABDOMINAL HYSTERECTOMY  1990   for endometrial cancer  . CATARACT EXTRACTION W/ INTRAOCULAR LENS  IMPLANT, BILATERAL    . [place]  . GUM SURGERY  1932  . MASTOIDECTOMY   1920   bilateral  . THYROIDECTOMY  1960  . TONSILLECTOMY  1916    Allergies  Allergen Reactions  . Amoxicillin     Unknown, patient unable to answer questionnaire   . Aspirin Other (See Comments)    On MAR  . Avelox [Moxifloxacin]     Unknown: listed on MAR  . Erythromycin     Unknown: listed on MAR  . Monistat [Miconazole]     Unknown: listed on MAR  . Morphine And Related     Unknown: listed on MAR  . Orange Juice [Orange Oil]     Unknown: listed on MAR    Allergies as of 04/30/2019      Reactions   Amoxicillin    Unknown, patient unable to answer questionnaire    Aspirin Other (See Comments)   On MAR   Avelox [moxifloxacin]    Unknown: listed on MAR   Erythromycin    Unknown: listed on MAR   Monistat [miconazole]    Unknown: listed on MAR   Morphine And Related    Unknown: listed on MAR   Orange Juice [orange Oil]    Unknown: listed on [facility]      Medication List       Accurate as of Apr 30, 2019  4:53 PM. If you have any questions, ask your nurse or doctor.        aluminum-magnesium hydroxide-simethicone 528-413-24 MG/5ML Susp Commonly known as:  MAALOX Take 30 mLs by mouth every 6 (six) hours as needed.   aspirin 81 MG chewable tablet Chew 81 mg by mouth daily.   calcium citrate-vitamin D 315-200 MG-UNIT tablet Commonly known as:  CITRACAL+D Take 1 tablet by mouth daily.   docusate sodium 100 MG capsule Commonly known as:  COLACE Take 200 mg by mouth daily.   fexofenadine 60 MG tablet Commonly known as:  ALLEGRA Take 60 mg by mouth daily.   furosemide 20 MG tablet Commonly known as:  LASIX Take 1 tablet by mouth daily.   hydrocortisone 1 % lotion Apply 1 application topically at bedtime. Apply to BLE QHS X2weeks   ipratropium-albuterol 0.5-2.5 (3) MG/3ML Soln Commonly known as:  DUONEB Take 3 mLs by nebulization 2 (two) times daily.   levothyroxine 150 MCG tablet Commonly known as:  SYNTHROID Take 150 mcg by mouth daily.   magnesium  hydroxide 400 MG/5ML suspension Commonly known as:  MILK OF MAGNESIA Take 30 mLs by mouth daily as needed for mild constipation.   Melatonin 3 MG Tabs Take 3 mg by mouth at bedtime.   multivitamin with minerals Tabs tablet Take 1 tablet by mouth daily. Cerovite Senior   potassium chloride 10 MEQ tablet Commonly known as:  K-DUR Take 10 mEq by mouth daily.   Refresh Tears 0.5 % Soln Generic drug:  carboxymethylcellulose Place 1 drop into both eyes 2 (two) times daily.   white petrolatum Gel Commonly known as:  VASELINE Apply 1 application topically daily. Apply to bilateral feet for dry scaly skin.   zinc oxide 20 % ointment Apply 1 application topically as needed for irritation (To buttocks after every incontinent episode and for redness.).       Review  of Systems  Constitutional: Negative for activity change, appetite change, chills, diaphoresis, fatigue, fever and unexpected weight change.  HENT: Positive for hearing loss. Negative for congestion and voice change.   Respiratory: Negative for cough, shortness of breath and wheezing.   Cardiovascular: Positive for leg swelling. Negative for chest pain and palpitations.  Gastrointestinal: Negative for abdominal distention, abdominal pain, constipation, diarrhea, nausea and vomiting.  Genitourinary: Negative for difficulty urinating, dysuria and urgency.  Musculoskeletal: Positive for arthralgias and gait problem.  Skin: Positive for color change. Negative for pallor, rash and wound.  Neurological: Negative for dizziness, speech difficulty, weakness and headaches.       Memory lapses. Chronic neuropathic pain in BLE  Psychiatric/Behavioral: Negative for agitation, behavioral problems, hallucinations and sleep disturbance. The patient is not nervous/anxious.     Immunization History  Administered Date(s) Administered  . Influenza Whole 10/19/2012, 09/15/2018  . Influenza-Unspecified 10/18/2013, 10/17/2014, 09/02/2015,  09/29/2016, 10/03/2017  . PPD Test 01/22/2013, 11/28/2015  . Pneumococcal Conjugate-13 11/18/2017  . Pneumococcal Polysaccharide-23 01/22/2013  . Td 02/21/2009  . Tdap 04/07/2015   Pertinent  Health Maintenance Due  Topic Date Due  . INFLUENZA VACCINE  07/14/2019  . PNA vac Low Risk Adult  Completed   Fall Risk  09/05/2018 09/02/2017 11/14/2015 04/17/2015 09/26/2014  Falls in the past year? Yes No No Yes No  Number falls in past yr: 2 or more - - 1 -  Injury with Fall? Yes - - Yes -  Comment - - - laceration forehead 04/07/15 -  Risk Factor Category  - - - High Fall Risk -  Risk for fall due to : - - Impaired mobility;Impaired vision - -   Functional Status Survey:    Vitals:   04/30/19 1425  BP: 138/66  Pulse: 74  Resp: (!) 22  Temp: (!) 97.2 F (36.2 C)  SpO2: 90%  Weight: 154 lb 11.2 oz (70.2 kg)  Height: 5' (1.524 m)   Body mass index is 30.21 kg/m. Physical Exam Vitals signs and nursing note reviewed.  Constitutional:      General: She is not in acute distress.    Appearance: Normal appearance. She is not ill-appearing, toxic-appearing or diaphoretic.  HENT:     Head: Normocephalic and atraumatic.     Nose: Nose normal.     Mouth/Throat:     Mouth: Mucous membranes are moist.  Eyes:     Extraocular Movements: Extraocular movements intact.     Conjunctiva/sclera: Conjunctivae normal.     Pupils: Pupils are equal, round, and reactive to light.  Neck:     Musculoskeletal: Normal range of motion and neck supple.  Cardiovascular:     Rate and Rhythm: Normal rate and regular rhythm.     Heart sounds: No murmur.  Pulmonary:     Effort: Pulmonary effort is normal.     Breath sounds: No wheezing, rhonchi or rales.  Abdominal:     General: There is no distension.     Palpations: Abdomen is soft.     Tenderness: There is no abdominal tenderness. There is no right CVA tenderness, left CVA tenderness, guarding or rebound.  Musculoskeletal:     Right lower leg: Edema  present.     Left lower leg: Edema present.     Comments: 1+ edema BLE. Ambulates with walker.  Skin:    General: Skin is warm and dry.     Coloration: Skin is not pale.     Findings: Erythema present. No lesion or  rash.     Comments: Scaly redness noted in BLE, R>L, no warmth  Neurological:     General: No focal deficit present.     Mental Status: She is alert. Mental status is at baseline.     Cranial Nerves: No cranial nerve deficit.     Motor: No weakness.     Coordination: Coordination normal.     Gait: Gait abnormal.     Comments: Oriented to person and place.   Psychiatric:        Mood and Affect: Mood normal.        Behavior: Behavior normal.        Thought Content: Thought content normal.     Labs reviewed: Recent Labs    08/17/18 1539 09/21/18 01/18/19  NA 138 137 139  K 4.2 4.1 4.0  CL 100 100 104  CO2 26 30 29   GLUCOSE 112*  --   --   BUN 27* 33* 36*  CREATININE 0.98 1.0 0.9  CALCIUM 9.4 9.0 8.8   Recent Labs    08/17/18 1539 09/21/18 01/18/19  AST 26 27 17   ALT 19 30 14   ALKPHOS 60 60 54  BILITOT 0.4  --   --   PROT 6.4* 5.8 5.9  ALBUMIN 3.4* .3.5 3.6   Recent Labs    08/17/18 1539 09/12/18 09/21/18 01/18/19  WBC 8.8 7.7 6.1 6.7  NEUTROABS 6.3  --   --   --   HGB 14.7 13.5 13.8 13.6  HCT 46.3* 41 42 41  MCV 92.2  --   --   --   PLT 185 198 187 190   Lab Results  Component Value Date   TSH 1.54 09/12/2018   No results found for: HGBA1C No results found for: CHOL, HDL, LDLCALC, LDLDIRECT, TRIG, CHOLHDL  Significant Diagnostic Results in last 30 days:  No results found.  Assessment/Plan: PVD (peripheral vascular disease) (HCC) Chronic red and scaly skin changes BLE, chronic swelling in BLE. Staff noted it worsened. No open wounds. No warmth noted. Will apply 1% Hydrocortisone lotion nightly to BLE. Continue Furosemide 38m qd fro edema. Observe for developing s/s of cellulitis.   Neuropathic pain of both legs Pain is at her baseline,  livable, pain when touched. Didn't tolerated analgesics well in the past.   Bilateral leg edema Chronic, at her baseline, continue Furosemide 272mqd.   Stasis dermatitis of both legs Chronic, red and scaly skin changes, worsened in the RLE, will apply 1% Hydrocortisone lotion nightly BLE x 2 weeks. Observe.     Family/ staff Communication: plan of care reviewed with the patient and charge nurse.   Labs/tests ordered: none  Time spend 25 minutes

## 2019-04-30 NOTE — Assessment & Plan Note (Signed)
Pain is at her baseline, livable, pain when touched. Didn't tolerated analgesics well in the past.

## 2019-04-30 NOTE — Assessment & Plan Note (Addendum)
Chronic red and scaly skin changes BLE, chronic swelling in BLE. Staff noted it worsened. No open wounds. No warmth noted. Will apply 1% Hydrocortisone lotion nightly to BLE. Continue Furosemide 20mg  qd fro edema. Observe for developing s/s of cellulitis.

## 2019-04-30 NOTE — Assessment & Plan Note (Signed)
Chronic, at her baseline, continue Furosemide 20mg  qd.

## 2019-05-14 ENCOUNTER — Encounter: Payer: Self-pay | Admitting: Nurse Practitioner

## 2019-05-14 ENCOUNTER — Non-Acute Institutional Stay (SKILLED_NURSING_FACILITY): Payer: Medicare Other | Admitting: Nurse Practitioner

## 2019-05-14 DIAGNOSIS — I739 Peripheral vascular disease, unspecified: Secondary | ICD-10-CM

## 2019-05-14 DIAGNOSIS — K5901 Slow transit constipation: Secondary | ICD-10-CM

## 2019-05-14 DIAGNOSIS — R600 Localized edema: Secondary | ICD-10-CM

## 2019-05-14 DIAGNOSIS — H01003 Unspecified blepharitis right eye, unspecified eyelid: Secondary | ICD-10-CM | POA: Insufficient documentation

## 2019-05-14 DIAGNOSIS — H01005 Unspecified blepharitis left lower eyelid: Secondary | ICD-10-CM

## 2019-05-14 DIAGNOSIS — J449 Chronic obstructive pulmonary disease, unspecified: Secondary | ICD-10-CM | POA: Diagnosis not present

## 2019-05-14 DIAGNOSIS — E039 Hypothyroidism, unspecified: Secondary | ICD-10-CM

## 2019-05-14 DIAGNOSIS — H01002 Unspecified blepharitis right lower eyelid: Secondary | ICD-10-CM

## 2019-05-14 NOTE — Assessment & Plan Note (Signed)
05/14/19 Bacitracin ophthalmic oint 500u/gm, 0.25 inch ribbon to the R+L lower conjunctival sac daily x 10 days.

## 2019-05-14 NOTE — Assessment & Plan Note (Signed)
Stable, continue Coalce 200mg  qhs.

## 2019-05-14 NOTE — Assessment & Plan Note (Signed)
Stable, continue Furosemide 20mg qd.  

## 2019-05-14 NOTE — Assessment & Plan Note (Signed)
Stable, continue Levothyroxine qd, last TSH wnl 09/2018.

## 2019-05-14 NOTE — Progress Notes (Signed)
Location:   SNF [place] Room Number: 28/A Place of Service:  SNF (31) Provider: [facility] Santana Gosdin NP  [name], MD  Patient Care Team: [name], MD as PCP - General (Internal Medicine) [place] [place] Jaden Abreu X, NP as Nurse Practitioner (Nurse Practitioner)  Extended Emergency Contact Information Primary Emergency Contact: [name] States of [place] Phone: [phone] Relation: Son Secondary Emergency Contact: [place] Phone: [phone] Mobile Phone: [phone] Relation: Daughter  Code Status:  DNR Goals of care: Advanced Directive information Advanced Directives 05/14/2019  Does Patient Have a Medical Advance Directive? Yes  Type of Advance Directive Out of facility DNR (pink MOST or yellow form);Healthcare Power of Attorney  Does patient want to make changes to medical advance directive? No - Patient declined  Copy of [place] in Chart? Yes - validated most recent copy scanned in chart (See row information)  Would patient like information on creating a medical advance directive? -  Pre-existing out of facility DNR order (yellow form or pink MOST form) Pink MOST form placed in chart (order not valid for inpatient use);Yellow form placed in chart (order not valid for inpatient use)     Chief Complaint  Patient presents with  . Medical Management of Chronic Issues    Routine visit     HPI:  Pt is a 85 y.o. female seen today for medical management of chronic diseases.    The patient resides in SNF [facility] for safety and care assistance. Hx of Hypothyroidism, on Levothyroxine 172mg qd, last TSH 1.54 09/12/18.  Peripheral edema BLE, stable, on Furosemide 225mqd. Constipation, stable, on Colace 20058mhs. COPD stable, on DuoNeb bid.   Staff reported small amount of yellow drainage from both eyes. There is crusted eye lashes in the lower eyelids noted, the patient stated a mild  irritation in eyes. She denied change of vision, headache, or facial pressure today.    Past Medical History:  Diagnosis Date  . Abnormal liver function tests    11/11/15 AST 31, ALT 88, alk phos 96   . Acute kidney failure, unspecified ([place]CC[place]/10/2012  . Arthralgia 09/26/2014   Multiple joints: knees, shoulders, wrists, spine hips   . Arthritis   . Candidiasis of other urogenital sites 08/10/2012  . Cerebral embolism 05/23/2005  . CHF (congestive heart failure) ([place]CC[place] . Cholelithiasis 11/04/2015  . Closed fracture of sacrum and coccyx without mention of spinal cord injury 02/14/2012  . Constipation 05/03/2013  . Contusion of face, scalp, and neck except eye(s) 06/01/2012  . Contusion of wrist 06/01/2012  . COPD (chronic obstructive pulmonary disease) (HCCPrest[place] . COPD, mild (HCC[place]/07/2013  . Edema 04/20/2012  . H/O: CVA (cerebrovascular accident)   . Hearing loss 09/26/2014  . History of cancer of uterus   . HTN (hypertension), benign   . Hyperglycemia 11/14/2015  . Hypothyroidism   . Insomnia, unspecified 08/10/2012  . Open wound of knee, leg (except thigh), and ankle, without mention of complication 5/90/02/4916 Other and unspecified hyperlipidemia 06/01/2012  . Other disorder of coccyx 01/27/2012  . Pain in joint, ankle and foot 06/14/2012  . Peripheral vascular disease, unspecified (HCC[place]/14/2013  . Personal history of fall 01/27/2012  . Pneumonia, organism unspecified(486) 01/23/2005  . Rheumatic fever 09/26/1933   Age 25 10/01/14 ESR 13, RAF <10   . Seasonal allergies 05/03/2013  . Stasis dermatitis of both legs 09/26/2014  . Unspecified constipation  11/02/2012  . Unspecified hearing loss 02/08/2013  . Unspecified hereditary and idiopathic peripheral neuropathy 01/27/2012  . Urinary frequency 02/24/2014  . Ventricular fibrillation ([place]) 01/24/2012   Past Surgical History:  Procedure Laterality Date  . ABDOMINAL HYSTERECTOMY  1990   for endometrial cancer  . CATARACT EXTRACTION W/  INTRAOCULAR LENS  IMPLANT, BILATERAL    . [place]  . GUM SURGERY  1932  . MASTOIDECTOMY  1920   bilateral  . THYROIDECTOMY  1960  . TONSILLECTOMY  1916    Allergies  Allergen Reactions  . Amoxicillin     Unknown, patient unable to answer questionnaire   . Aspirin Other (See Comments)    On MAR  . Avelox [Moxifloxacin]     Unknown: listed on MAR  . Erythromycin     Unknown: listed on MAR  . Monistat [Miconazole]     Unknown: listed on MAR  . Morphine And Related     Unknown: listed on MAR  . Orange Juice [Orange Oil]     Unknown: listed on MAR    Allergies as of 05/14/2019      Reactions   Amoxicillin    Unknown, patient unable to answer questionnaire    Aspirin Other (See Comments)   On MAR   Avelox [moxifloxacin]    Unknown: listed on MAR   Erythromycin    Unknown: listed on MAR   Monistat [miconazole]    Unknown: listed on MAR   Morphine And Related    Unknown: listed on MAR   Orange Juice [orange Oil]    Unknown: listed on [facility]      Medication List       Accurate as of May 14, 2019  4:50 PM. If you have any questions, ask your nurse or doctor.        aluminum-magnesium hydroxide-simethicone 026-378-58 MG/5ML Susp Commonly known as:  MAALOX Take 30 mLs by mouth every 6 (six) hours as needed.   aspirin 81 MG chewable tablet Chew 81 mg by mouth daily.   calcium citrate-vitamin D 315-200 MG-UNIT tablet Commonly known as:  CITRACAL+D Take 1 tablet by mouth daily.   docusate sodium 100 MG capsule Commonly known as:  COLACE Take 200 mg by mouth at bedtime.   fexofenadine 60 MG tablet Commonly known as:  ALLEGRA Take 60 mg by mouth daily.   furosemide 20 MG tablet Commonly known as:  LASIX Take 1 tablet by mouth daily.   hydrocortisone 1 % lotion Apply 1 application topically at bedtime. Apply to BLE QHS X2weeks   ipratropium-albuterol 0.5-2.5 (3) MG/3ML Soln Commonly known as:  DUONEB Take 3 mLs by nebulization 2 (two)  times daily.   levothyroxine 150 MCG tablet Commonly known as:  SYNTHROID Take 150 mcg by mouth daily.   magnesium hydroxide 400 MG/5ML suspension Commonly known as:  MILK OF MAGNESIA Take 30 mLs by mouth daily as needed for mild constipation.   Melatonin 3 MG Tabs Take 3 mg by mouth at bedtime.   multivitamin with minerals Tabs tablet Take 1 tablet by mouth daily. [employer] Senior   potassium chloride 10 MEQ tablet Commonly known as:  K-DUR Take 10 mEq by mouth daily.   Refresh Tears 0.5 % Soln Generic drug:  carboxymethylcellulose Place 1 drop into both eyes 2 (two) times daily.   white petrolatum Gel Commonly known as:  VASELINE Apply 1 application topically daily. Apply to bilateral feet for dry scaly skin.   zinc oxide 20 %  ointment Apply 1 application topically as needed for irritation (To buttocks after every incontinent episode and for redness.).      ROS was provided with assistance of staff Review of Systems  Constitutional: Negative for activity change, appetite change, chills, diaphoresis, fatigue, fever and unexpected weight change.  HENT: Positive for hearing loss. Negative for congestion, facial swelling, sinus pressure, sinus pain, sore throat and voice change.   Eyes: Positive for pain, discharge and redness. Negative for photophobia, itching and visual disturbance.  Respiratory: Negative for cough, shortness of breath and wheezing.   Cardiovascular: Positive for leg swelling. Negative for chest pain and palpitations.  Gastrointestinal: Negative for abdominal distention, abdominal pain, constipation, diarrhea, nausea and vomiting.  Genitourinary: Negative for difficulty urinating, dysuria, hematuria and urgency.  Musculoskeletal: Positive for back pain and gait problem.  Skin: Negative for color change and pallor.  Neurological: Negative for dizziness, speech difficulty, weakness and headaches.       Memory lapses.   Psychiatric/Behavioral: Negative for  agitation, behavioral problems, hallucinations and sleep disturbance. The patient is not nervous/anxious.     Immunization History  Administered Date(s) Administered  . Influenza Whole 10/19/2012, 09/15/2018  . Influenza-Unspecified 10/18/2013, 10/17/2014, 09/02/2015, 09/29/2016, 10/03/2017  . PPD Test 01/22/2013, 11/28/2015  . Pneumococcal Conjugate-13 11/18/2017  . Pneumococcal Polysaccharide-23 01/22/2013  . Td 02/21/2009  . Tdap 04/07/2015   Pertinent  Health Maintenance Due  Topic Date Due  . INFLUENZA VACCINE  07/14/2019  . PNA vac Low Risk Adult  Completed   Fall Risk  09/05/2018 09/02/2017 11/14/2015 04/17/2015 09/26/2014  Falls in the past year? Yes No No Yes No  Number falls in past yr: 2 or more - - 1 -  Injury with Fall? Yes - - Yes -  Comment - - - laceration forehead 04/07/15 -  Risk Factor Category  - - - High Fall Risk -  Risk for fall due to : - - Impaired mobility;Impaired vision - -   Functional Status Survey:    Vitals:   05/14/19 0920  BP: 120/70  Pulse: 70  Resp: 20  Temp: (!) 97.3 F (36.3 C)  SpO2: 92%  Weight: 157 lb 1.6 oz (71.3 kg)  Height: 5' (1.524 m)   Body mass index is 30.68 kg/m. Physical Exam Constitutional:      General: She is not in acute distress.    Appearance: Normal appearance. She is obese. She is not ill-appearing.  HENT:     Head: Normocephalic and atraumatic.     Nose: Nose normal. No congestion.     Mouth/Throat:     Mouth: Mucous membranes are moist.  Eyes:     Extraocular Movements: Extraocular movements intact.     Conjunctiva/sclera: Conjunctivae normal.     Pupils: Pupils are equal, round, and reactive to light.     Comments: R+L lower eyelashes-crusted, margin of eyelids are irritated.   Neck:     Musculoskeletal: Normal range of motion and neck supple.  Cardiovascular:     Rate and Rhythm: Normal rate and regular rhythm.     Heart sounds: No murmur.  Pulmonary:     Effort: Pulmonary effort is normal.      Breath sounds: Normal breath sounds. No wheezing, rhonchi or rales.  Abdominal:     Palpations: Abdomen is soft.     Tenderness: There is no abdominal tenderness. There is no left CVA tenderness, guarding or rebound.  Musculoskeletal:     Right lower leg: Edema present.  Left lower leg: Edema present.     Comments: Trace edema BLE. Ambulates with walker.   Skin:    General: Skin is warm and dry.  Neurological:     General: No focal deficit present.     Mental Status: She is alert. Mental status is at baseline.     Motor: No weakness.     Coordination: Coordination normal.     Gait: Gait abnormal.     Comments: Oriented to person and place.   Psychiatric:        Mood and Affect: Mood normal.        Behavior: Behavior normal.        Thought Content: Thought content normal.     Labs reviewed: Recent Labs    08/17/18 1539 09/21/18 01/18/19  NA 138 137 139  K 4.2 4.1 4.0  CL 100 100 104  CO2 26 30 29   GLUCOSE 112*  --   --   BUN 27* 33* 36*  CREATININE 0.98 1.0 0.9  CALCIUM 9.4 9.0 8.8   Recent Labs    08/17/18 1539 09/21/18 01/18/19  AST 26 27 17   ALT 19 30 14   ALKPHOS 60 60 54  BILITOT 0.4  --   --   PROT 6.4* 5.8 5.9  ALBUMIN 3.4* .3.5 3.6   Recent Labs    08/17/18 1539 09/12/18 09/21/18 01/18/19  WBC 8.8 7.7 6.1 6.7  NEUTROABS 6.3  --   --   --   HGB 14.7 13.5 13.8 13.6  HCT 46.3* 41 42 41  MCV 92.2  --   --   --   PLT 185 198 187 190   Lab Results  Component Value Date   TSH 1.54 09/12/2018   No results found for: HGBA1C No results found for: CHOL, HDL, LDLCALC, LDLDIRECT, TRIG, CHOLHDL  Significant Diagnostic Results in last 30 days:  No results found.  Assessment/Plan  PVD (peripheral vascular disease) (HCC) Chronic edema BLE remains no change, continue Furosemide 21m qd.   COPD, mild (HCC) Stable, continue DuoNeb bid.   Hypothyroidism Stable, continue Levothyroxine 1527m qd, last TSH wnl 09/2018.   Bilateral leg edema Stable,  continue Furosemide 2067md.   Constipation Stable, continue Coalce 200m39ms.   Blepharitis of both eyes 05/14/19 Bacitracin ophthalmic oint 500u/gm, 0.25 inch ribbon to the R+L lower conjunctival sac daily x 10 days.      Family/ staff Communication: plan of care reviewed with the patient and charge nurse.   Labs/tests ordered: none  Time spend 25 minutes.

## 2019-05-14 NOTE — Assessment & Plan Note (Signed)
Chronic edema BLE remains no change, continue Furosemide 20mg  qd.

## 2019-05-14 NOTE — Assessment & Plan Note (Signed)
Stable, continue DuoNeb bid.  

## 2019-06-21 ENCOUNTER — Non-Acute Institutional Stay (SKILLED_NURSING_FACILITY): Payer: Medicare Other | Admitting: Nurse Practitioner

## 2019-06-21 ENCOUNTER — Encounter: Payer: Self-pay | Admitting: Nurse Practitioner

## 2019-06-21 DIAGNOSIS — H01005 Unspecified blepharitis left lower eyelid: Secondary | ICD-10-CM

## 2019-06-21 DIAGNOSIS — H01002 Unspecified blepharitis right lower eyelid: Secondary | ICD-10-CM | POA: Diagnosis not present

## 2019-06-21 DIAGNOSIS — T7840XA Allergy, unspecified, initial encounter: Secondary | ICD-10-CM | POA: Insufficient documentation

## 2019-06-21 DIAGNOSIS — T7840XD Allergy, unspecified, subsequent encounter: Secondary | ICD-10-CM | POA: Diagnosis not present

## 2019-06-21 NOTE — Assessment & Plan Note (Signed)
Stable, continue Fexofenadine 60mg  qd.

## 2019-06-21 NOTE — Assessment & Plan Note (Signed)
Recurrent, will apply Blephamide 10%/0.2% oint 0.5inch ribbon to the R+L lower conjunctival sac nightly x 2 week. Warm compress to OU q am. Observe.

## 2019-06-21 NOTE — Progress Notes (Signed)
Location:  [place] Room Number: 28A Place of Service:  SNF (31) Provider:   Otho Darner, NP   [name], MD  Patient Care Team: [name], MD as PCP - General (Internal Medicine) [place], [place] ,  X, NP as Nurse Practitioner (Nurse Practitioner)  Extended Emergency Contact Information Primary Emergency Contact: [name] States of [place] Phone: [phone] Relation: Son Secondary Emergency Contact: [place] Phone: [phone] Mobile Phone: [phone] Relation: Daughter  Code Status:  DNR Goals of care: Advanced Directive information Advanced Directives 06/21/2019  Does Patient Have a Medical Advance Directive? Yes  Type of Advance Directive Out of facility DNR (pink MOST or yellow form);Healthcare Power of Attorney  Does patient want to make changes to medical advance directive? No - Patient declined  Copy of [place] in Chart? Yes - validated most recent copy scanned in chart (See row information)  Would patient like information on creating a medical advance directive? -  Pre-existing out of facility DNR order (yellow form or pink MOST form) Yellow form placed in chart (order not valid for inpatient use);Pink MOST form placed in chart (order not valid for inpatient use)     Chief Complaint  Patient presents with  . Acute Visit    Left Eye Pain    HPI:  Pt is a 85 y.o. female seen today for an acute visit for reported pain in the left eye. The patient stated its irritation in both eyes, noted mild injected conjunctivae and crusted eye lashes. She denied headache, eyeball pain or pressure, or change of vision. Hx of seasonal allergy, stable, on Fexofenadine 9m qd.    Past Medical History:  Diagnosis Date  . Abnormal liver function tests    11/11/15 AST 31, ALT 88, alk phos 96   . Acute kidney failure, unspecified (HParis 01/24/2012  .  Arthralgia 09/26/2014   Multiple joints: knees, shoulders, wrists, spine hips   . Arthritis   . Candidiasis of other urogenital sites 08/10/2012  . Cerebral embolism 05/23/2005  . CHF (congestive heart failure) ([place]   . Cholelithiasis 11/04/2015  . Closed fracture of sacrum and coccyx without mention of spinal cord injury 02/14/2012  . Constipation 05/03/2013  . Contusion of face, scalp, and neck except eye(s) 06/01/2012  . Contusion of wrist 06/01/2012  . COPD (chronic obstructive pulmonary disease) ([place]   . COPD, mild (HQuinhagak 01/20/2013  . Edema 04/20/2012  . H/O: CVA (cerebrovascular accident)   . Hearing loss 09/26/2014  . History of cancer of uterus   . HTN (hypertension), benign   . Hyperglycemia 11/14/2015  . Hypothyroidism   . Insomnia, unspecified 08/10/2012  . Open wound of knee, leg (except thigh), and ankle, without mention of complication 59/06/6733 . Other and unspecified hyperlipidemia 06/01/2012  . Other disorder of coccyx 01/27/2012  . Pain in joint, ankle and foot 06/14/2012  . Peripheral vascular disease, unspecified (HGrove [place] 01/27/2012  . Personal history of fall 01/27/2012  . Pneumonia, organism unspecified(486) 01/23/2005  . Rheumatic fever 09/26/1933   Age 67 10/01/14 ESR 13, RAF <10   . Seasonal allergies 05/03/2013  . Stasis dermatitis of both legs 09/26/2014  . Unspecified constipation 11/02/2012  . Unspecified hearing loss 02/08/2013  . Unspecified hereditary and idiopathic peripheral neuropathy 01/27/2012  . Urinary frequency 02/24/2014  . Ventricular fibrillation (HPi[place] 01/24/2012   Past Surgical History:  Procedure Laterality Date  . ABDOMINAL HYSTERECTOMY  1990  for endometrial cancer  . CATARACT EXTRACTION W/ INTRAOCULAR LENS  IMPLANT, BILATERAL    . [place]  . GUM SURGERY  1932  . MASTOIDECTOMY  1920   bilateral  . THYROIDECTOMY  1960  . TONSILLECTOMY  1916    Allergies  Allergen Reactions  . Amoxicillin     Unknown, patient unable to  answer questionnaire   . Aspirin Other (See Comments)    On MAR  . Avelox [Moxifloxacin]     Unknown: listed on MAR  . Erythromycin     Unknown: listed on MAR  . Monistat [Miconazole]     Unknown: listed on MAR  . Morphine And Related     Unknown: listed on MAR  . Orange Juice [Orange Oil]     Unknown: listed on [facility]    Outpatient Encounter Medications as of 06/21/2019  Medication Sig  . aluminum-magnesium hydroxide-simethicone (MAALOX) 810-175-10 MG/5ML SUSP Take 30 mLs by mouth every 6 (six) hours as needed.  Marland Kitchen aspirin 81 MG chewable tablet Chew 81 mg by mouth daily.  . calcium citrate-vitamin D (CITRACAL+D) 315-200 MG-UNIT per tablet Take 1 tablet by mouth daily.  . carboxymethylcellulose (REFRESH TEARS) 0.5 % SOLN Place 1 drop into both eyes 2 (two) times daily.  Marland Kitchen docusate sodium (COLACE) 100 MG capsule Take 200 mg by mouth at bedtime.   . fexofenadine (ALLEGRA) 60 MG tablet Take 60 mg by mouth daily.  . furosemide (LASIX) 20 MG tablet Take 1 tablet by mouth daily.  . hydrocortisone 1 % lotion Apply 1 application topically at bedtime. Apply to BLE QHS X2weeks  . ipratropium-albuterol (DUONEB) 0.5-2.5 (3) MG/3ML SOLN Take 3 mLs by nebulization 2 (two) times daily.   Marland Kitchen levothyroxine (SYNTHROID, LEVOTHROID) 150 MCG tablet Take 150 mcg by mouth daily.   . magnesium hydroxide (MILK OF MAGNESIA) 400 MG/5ML suspension Take 30 mLs by mouth daily as needed for mild constipation.  . Melatonin 3 MG TABS Take 3 mg by mouth at bedtime.   . Multiple Vitamin (MULTIVITAMIN WITH MINERALS) TABS tablet Take 1 tablet by mouth daily. Radiation protection practitioner  . potassium chloride (K-DUR,KLOR-CON) 10 MEQ tablet Take 10 mEq by mouth daily.   . white petrolatum (VASELINE) GEL Apply 1 application topically daily. Apply to bilateral feet for dry scaly skin.  Marland Kitchen zinc oxide 20 % ointment Apply 1 application topically as needed for irritation (To buttocks after every incontinent episode and for redness.).   No  facility-administered encounter medications on file as of 06/21/2019.    ROS was provided with assistance of staff Review of Systems  Constitutional: Negative for activity change, appetite change, chills, diaphoresis, fatigue and fever.  HENT: Positive for hearing loss and postnasal drip. Negative for congestion, ear pain, facial swelling, mouth sores, rhinorrhea, sinus pressure, sinus pain, sneezing, sore throat, tinnitus, trouble swallowing and voice change.   Eyes: Positive for pain, discharge, redness and itching. Negative for photophobia and visual disturbance.  Respiratory: Negative for cough, shortness of breath and wheezing.   Cardiovascular: Positive for leg swelling. Negative for chest pain and palpitations.  Skin: Negative for color change and pallor.  Neurological: Negative for dizziness, facial asymmetry, speech difficulty, weakness, light-headedness and headaches.       Memory lapses.   Psychiatric/Behavioral: Negative for agitation, behavioral problems, hallucinations and sleep disturbance. The patient is not nervous/anxious.     Immunization History  Administered Date(s) Administered  . Influenza Whole 10/19/2012, 09/15/2018  . Influenza-Unspecified 10/18/2013, 10/17/2014, 09/02/2015, 09/29/2016, 10/03/2017  .  PPD Test 01/22/2013, 11/28/2015  . Pneumococcal Conjugate-13 11/18/2017  . Pneumococcal Polysaccharide-23 01/22/2013  . Td 02/21/2009  . Tdap 04/07/2015   Pertinent  Health Maintenance Due  Topic Date Due  . INFLUENZA VACCINE  07/14/2019  . PNA vac Low Risk Adult  Completed   Fall Risk  09/05/2018 09/02/2017 11/14/2015 04/17/2015 09/26/2014  Falls in the past year? Yes No No Yes No  Number falls in past yr: 2 or more - - 1 -  Injury with Fall? Yes - - Yes -  Comment - - - laceration forehead 04/07/15 -  Risk Factor Category  - - - High Fall Risk -  Risk for fall due to : - - Impaired mobility;Impaired vision - -   Functional Status Survey:    Vitals:   06/21/19  1101  BP: 140/70  Pulse: 70  Resp: 20  Temp: (!) 97.5 F (36.4 C)  TempSrc: Oral  SpO2: 98%  Weight: 162 lb 9.6 oz (73.8 kg)  Height: 5' (1.524 m)   Body mass index is 31.76 kg/m. Physical Exam Constitutional:      Appearance: She is obese.  HENT:     Head: Normocephalic and atraumatic.     Nose: Rhinorrhea present. No congestion.     Mouth/Throat:     Mouth: Mucous membranes are moist.  Eyes:     General: No scleral icterus.       Right eye: Discharge present.        Left eye: Discharge present.    Extraocular Movements: Extraocular movements intact.     Pupils: Pupils are equal, round, and reactive to light.     Comments: Mild injected R+L conjunctivae, crusted eyelashes, irritated lower margin of lower eyelids.   Cardiovascular:     Rate and Rhythm: Normal rate and regular rhythm.     Heart sounds: No murmur.  Pulmonary:     Effort: Pulmonary effort is normal.     Breath sounds: No wheezing, rhonchi or rales.  Chest:     Chest wall: No tenderness.  Musculoskeletal:     Right lower leg: Edema present.     Left lower leg: Edema present.     Comments: 1+ chronic edema BLE, w/c for mobility.   Skin:    General: Skin is warm and dry.  Neurological:     General: No focal deficit present.     Mental Status: Mental status is at baseline.     Comments: Oriented to person and place.   Psychiatric:        Mood and Affect: Mood normal.        Behavior: Behavior normal.        Thought Content: Thought content normal.        Judgment: Judgment normal.     Labs reviewed: Recent Labs    08/17/18 1539 09/21/18 01/18/19  NA 138 137 139  K 4.2 4.1 4.0  CL 100 100 104  CO2 26 30 29   GLUCOSE 112*  --   --   BUN 27* 33* 36*  CREATININE 0.98 1.0 0.9  CALCIUM 9.4 9.0 8.8   Recent Labs    08/17/18 1539 09/21/18 01/18/19  AST 26 27 17   ALT 19 30 14   ALKPHOS 60 60 54  BILITOT 0.4  --   --   PROT 6.4* 5.8 5.9  ALBUMIN 3.4* .3.5 3.6   Recent Labs    08/17/18 1539  09/12/18 09/21/18 01/18/19  WBC 8.8 7.7 6.1 6.7  NEUTROABS  6.3  --   --   --   HGB 14.7 13.5 13.8 13.6  HCT 46.3* 41 42 41  MCV 92.2  --   --   --   PLT 185 198 187 190   Lab Results  Component Value Date   TSH 1.54 09/12/2018    Significant Diagnostic Results in last 30 days:   Assessment/Plan Blepharitis of both eyes Recurrent, will apply Blephamide 10%/0.2% oint 0.5inch ribbon to the R+L lower conjunctival sac nightly x 2 week. Warm compress to OU q am. Observe.   Allergy Stable, continue Fexofenadine 21m qd.     Family/ staff Communication: plan of care reviewed with the patient and charge nurse.   Labs/tests ordered:  None  Time spend 25 minutes

## 2019-07-13 ENCOUNTER — Non-Acute Institutional Stay (SKILLED_NURSING_FACILITY): Payer: Medicare Other | Admitting: Nurse Practitioner

## 2019-07-13 ENCOUNTER — Encounter: Payer: Self-pay | Admitting: Nurse Practitioner

## 2019-07-13 DIAGNOSIS — E039 Hypothyroidism, unspecified: Secondary | ICD-10-CM | POA: Diagnosis not present

## 2019-07-13 DIAGNOSIS — I739 Peripheral vascular disease, unspecified: Secondary | ICD-10-CM | POA: Diagnosis not present

## 2019-07-13 DIAGNOSIS — K5901 Slow transit constipation: Secondary | ICD-10-CM

## 2019-07-13 NOTE — Progress Notes (Signed)
Location:  [place] Room Number: 28 Place of Service:  SNF (31) Provider:  Marlana Latus  NP  [name], MD  Patient Care Team: [name], MD as PCP - General (Internal Medicine) [place], [place], [facility] Crew Goren X, NP as Nurse Practitioner (Nurse Practitioner)  Extended Emergency Contact Information Primary Emergency Contact: [name] States of [place] Phone: [phone] Relation: Son Secondary Emergency Contact: [place] Phone: [phone] Mobile Phone: [phone] Relation: Daughter  Code Status:  DNR Goals of care: Advanced Directive information Advanced Directives 07/13/2019  Does Patient Have a Medical Advance Directive? Yes  Type of Advance Directive Out of facility DNR (pink MOST or yellow form)  Does patient want to make changes to medical advance directive? No - Patient declined  Copy of [place] in Chart? Yes - validated most recent copy scanned in chart (See row information)  Would patient like information on creating a medical advance directive? -  Pre-existing out of facility DNR order (yellow form or pink MOST form) Yellow form placed in chart (order not valid for inpatient use)     Chief Complaint  Patient presents with  . Medical Management of Chronic Issues    HPI:  Pt is a 85 y.o. female seen today for medical management of chronic diseases.     The patient resides in [facility] for safety and care assistance, w/c for mobility. Hx of chronic edema BLE, stable on Furosemide 41m qd. Hypothyroidism, on Levothyroxine 1562m qd, last TSH wnl 09/2018. Constipation, stable on MOM daily prn, Colace 20041md.   Past Medical History:  Diagnosis Date  . Abnormal liver function tests    11/11/15 AST 31, ALT 88, alk phos 96   . Acute kidney failure, unspecified (HCC[place]/10/2012  . Arthralgia 09/26/2014   Multiple joints: knees, shoulders, wrists,  spine hips   . Arthritis   . Candidiasis of other urogenital sites 08/10/2012  . Cerebral embolism 05/23/2005  . CHF (congestive heart failure) (HCC[place] . Cholelithiasis 11/04/2015  . Closed fracture of sacrum and coccyx without mention of spinal cord injury 02/14/2012  . Constipation 05/03/2013  . Contusion of face, scalp, and neck except eye(s) 06/01/2012  . Contusion of wrist 06/01/2012  . COPD (chronic obstructive pulmonary disease) (HCCSan[place] . COPD, mild (HCCBre[place]/07/2013  . Edema 04/20/2012  . H/O: CVA (cerebrovascular accident)   . Hearing loss 09/26/2014  . History of cancer of uterus   . HTN (hypertension), benign   . Hyperglycemia 11/14/2015  . Hypothyroidism   . Insomnia, unspecified 08/10/2012  . Open wound of knee, leg (except thigh), and ankle, without mention of complication 5/97/0/6237 Other and unspecified hyperlipidemia 06/01/2012  . Other disorder of coccyx 01/27/2012  . Pain in joint, ankle and foot 06/14/2012  . Peripheral vascular disease, unspecified (HCCShawmut/14/2013  . Personal history of fall 01/27/2012  . Pneumonia, organism unspecified(486) 01/23/2005  . Rheumatic fever 09/26/1933   Age 6 10/01/14 ESR 13, RAF <10   . Seasonal allergies 05/03/2013  . Stasis dermatitis of both legs 09/26/2014  . Unspecified constipation 11/02/2012  . Unspecified hearing loss 02/08/2013  . Unspecified hereditary and idiopathic peripheral neuropathy 01/27/2012  . Urinary frequency 02/24/2014  . Ventricular fibrillation (HCCGun Barrel City/10/2012   Past Surgical History:  Procedure Laterality Date  . ABDOMINAL HYSTERECTOMY  1990   for endometrial cancer  . CATARACT EXTRACTION W/ INTRAOCULAR LENS  IMPLANT, BILATERAL    .  [place]  . GUM SURGERY  1932  . MASTOIDECTOMY  1920   bilateral  . THYROIDECTOMY  1960  . TONSILLECTOMY  1916    Allergies  Allergen Reactions  . Amoxicillin     Unknown, patient unable to answer questionnaire   . Aspirin Other (See Comments)    On MAR   . Avelox [Moxifloxacin]     Unknown: listed on MAR  . Erythromycin     Unknown: listed on MAR  . Monistat [Miconazole]     Unknown: listed on MAR  . Morphine And Related     Unknown: listed on MAR  . Orange Juice [Orange Oil]     Unknown: listed on [facility]    Outpatient Encounter Medications as of 07/13/2019  Medication Sig  . aluminum-magnesium hydroxide-simethicone (MAALOX) [phone] MG/5ML SUSP Take 30 mLs by mouth every 6 (six) hours as needed.  Marland Kitchen aspirin 81 MG chewable tablet Chew 81 mg by mouth daily.  . calcium citrate-vitamin D (CITRACAL+D) 315-200 MG-UNIT per tablet Take 1 tablet by mouth daily.  . carboxymethylcellulose (REFRESH TEARS) 0.5 % SOLN Place 1 drop into both eyes 2 (two) times daily.  Marland Kitchen docusate sodium (COLACE) 100 MG capsule Take 200 mg by mouth at bedtime.   . fexofenadine (ALLEGRA) 60 MG tablet Take 60 mg by mouth daily.  . furosemide (LASIX) 20 MG tablet Take 1 tablet by mouth daily.  Marland Kitchen ipratropium-albuterol (DUONEB) 0.5-2.5 (3) MG/3ML SOLN Take 3 mLs by nebulization 2 (two) times daily.   Marland Kitchen levothyroxine (SYNTHROID, LEVOTHROID) 150 MCG tablet Take 150 mcg by mouth daily.   . magnesium hydroxide (MILK OF MAGNESIA) 400 MG/5ML suspension Take 30 mLs by mouth daily as needed for mild constipation.  . Melatonin 3 MG TABS Take 3 mg by mouth at bedtime.   . Multiple Vitamin (MULTIVITAMIN WITH MINERALS) TABS tablet Take 1 tablet by mouth daily. Radiation protection practitioner  . potassium chloride (K-DUR,KLOR-CON) 10 MEQ tablet Take 10 mEq by mouth daily.   . white petrolatum (VASELINE) GEL Apply 1 application topically daily. Apply to bilateral feet for dry scaly skin.  Marland Kitchen zinc oxide 20 % ointment Apply 1 application topically as needed for irritation (To buttocks after every incontinent episode and for redness.).  . [DISCONTINUED] hydrocortisone 1 % lotion Apply 1 application topically at bedtime. Apply to BLE QHS X2weeks   No facility-administered encounter medications on file as of  07/13/2019.    ROS was provided with assistance of staff Review of Systems  Constitutional: Negative for activity change, appetite change, chills, diaphoresis, fatigue, fever and unexpected weight change.  HENT: Positive for hearing loss. Negative for congestion and voice change.   Respiratory: Negative for cough, shortness of breath and wheezing.   Cardiovascular: Positive for leg swelling. Negative for chest pain and palpitations.  Gastrointestinal: Negative for abdominal distention, abdominal pain, constipation, diarrhea, nausea and vomiting.  Genitourinary: Negative for difficulty urinating, dysuria and urgency.  Musculoskeletal: Positive for arthralgias and gait problem.  Skin: Negative for color change and pallor.  Neurological: Negative for dizziness, speech difficulty, weakness and headaches.       Memory lapses.   Psychiatric/Behavioral: Negative for agitation, behavioral problems, hallucinations and sleep disturbance. The patient is not nervous/anxious.     Immunization History  Administered Date(s) Administered  . Influenza Whole 10/19/2012, 09/15/2018  . Influenza-Unspecified 10/18/2013, 10/17/2014, 09/02/2015, 09/29/2016, 10/03/2017  . PPD Test 01/22/2013, 11/28/2015  . Pneumococcal Conjugate-13 11/18/2017  . Pneumococcal Polysaccharide-23 01/22/2013  . Td 02/21/2009  .  Tdap 04/07/2015   Pertinent  Health Maintenance Due  Topic Date Due  . INFLUENZA VACCINE  07/14/2019  . PNA vac Low Risk Adult  Completed   Fall Risk  09/05/2018 09/02/2017 11/14/2015 04/17/2015 09/26/2014  Falls in the past year? Yes No No Yes No  Number falls in past yr: 2 or more - - 1 -  Injury with Fall? Yes - - Yes -  Comment - - - laceration forehead 04/07/15 -  Risk Factor Category  - - - High Fall Risk -  Risk for fall due to : - - Impaired mobility;Impaired vision - -   Functional Status Survey:    Vitals:   07/13/19 1043  BP: 120/60  Pulse: 72  Resp: 20  Temp: (!) 97.1 F (36.2 C)   SpO2: 93%  Weight: 162 lb 1.6 oz (73.5 kg)   Body mass index is 31.66 kg/m. Physical Exam Vitals signs and nursing note reviewed.  Constitutional:      General: She is not in acute distress.    Appearance: Normal appearance. She is obese. She is not ill-appearing, toxic-appearing or diaphoretic.  HENT:     Head: Normocephalic and atraumatic.     Nose: Nose normal.     Mouth/Throat:     Mouth: Mucous membranes are moist.  Eyes:     Extraocular Movements: Extraocular movements intact.     Conjunctiva/sclera: Conjunctivae normal.     Pupils: Pupils are equal, round, and reactive to light.  Neck:     Musculoskeletal: Normal range of motion and neck supple.  Cardiovascular:     Rate and Rhythm: Normal rate and regular rhythm.     Heart sounds: No murmur.  Pulmonary:     Effort: Pulmonary effort is normal.     Breath sounds: No wheezing, rhonchi or rales.  Abdominal:     General: Bowel sounds are normal. There is no distension.     Palpations: Abdomen is soft.     Tenderness: There is no abdominal tenderness. There is no right CVA tenderness, left CVA tenderness, guarding or rebound.  Musculoskeletal:     Right lower leg: Edema present.     Left lower leg: Edema present.     Comments: 1+ edema BLE  Skin:    General: Skin is warm and dry.  Neurological:     General: No focal deficit present.     Mental Status: She is alert. Mental status is at baseline.     Comments: Oriented to person and place.  Psychiatric:        Mood and Affect: Mood normal.        Behavior: Behavior normal.        Thought Content: Thought content normal.     Labs reviewed: Recent Labs    08/17/18 1539 09/21/18 01/18/19  NA 138 137 139  K 4.2 4.1 4.0  CL 100 100 104  CO2 _0 GLUCOSE 112*  --   --   BUN 27* 33* 36*  CREATININE 0.98 1.0 0.9  CALCIUM 9.4 9.0 8.8   Recent Labs    08/17/18 1539 09/21/18 01/18/19  AST _1 ALT _2 ALKPHOS 60 60 54  BILITOT 0.4  --   --    PROT 6.4* 5.8 5.9  ALBUMIN 3.4* .3.5 3.6   Recent Labs    08/17/18 1539 09/12/18 09/21/18 01/18/19  WBC 8.8 7.7 6.1 6.7  NEUTROABS 6.3  --   --   --  HGB 14.7 13.5 13.8 13.6  HCT 46.3* 41 42 41  MCV 92.2  --   --   --   PLT 185 198 187 190   Lab Results  Component Value Date   TSH 1.54 09/12/2018   No results found for: HGBA1C No results found for: CHOL, HDL, LDLCALC, LDLDIRECT, TRIG, CHOLHDL  Significant Diagnostic Results in last 30 days:  No results found.  Assessment/Plan PVD (peripheral vascular disease) (HCC) Chronic edema BLE, remains no change, continue Furosemide 83m qd.   Hypothyroidism Stable, continue Levothyroxine 1572m qd, last TSH 1.54 09/2018  Constipation Stable, continue prn MOM, continue Colace 20090md.      Family/ staff Communication: plan of care reviewed with the patient and charge nurse.   Labs/tests ordered:  none  Time spend 25 minutes.

## 2019-07-15 ENCOUNTER — Encounter: Payer: Self-pay | Admitting: Nurse Practitioner

## 2019-07-15 NOTE — Assessment & Plan Note (Signed)
Stable, continue prn MOM, continue Colace 200mg  qd.

## 2019-07-15 NOTE — Assessment & Plan Note (Signed)
Stable, continue Levothyroxine 16mcg qd, last TSH 1.54 09/2018

## 2019-07-15 NOTE — Assessment & Plan Note (Signed)
Chronic edema BLE, remains no change, continue Furosemide 20mg  qd.

## 2019-08-06 ENCOUNTER — Encounter: Payer: Self-pay | Admitting: Internal Medicine

## 2019-08-06 ENCOUNTER — Non-Acute Institutional Stay (SKILLED_NURSING_FACILITY): Payer: Medicare Other | Admitting: Internal Medicine

## 2019-08-06 DIAGNOSIS — R2681 Unsteadiness on feet: Secondary | ICD-10-CM | POA: Diagnosis not present

## 2019-08-06 DIAGNOSIS — I10 Essential (primary) hypertension: Secondary | ICD-10-CM | POA: Diagnosis not present

## 2019-08-06 DIAGNOSIS — R600 Localized edema: Secondary | ICD-10-CM | POA: Diagnosis not present

## 2019-08-06 DIAGNOSIS — E039 Hypothyroidism, unspecified: Secondary | ICD-10-CM | POA: Diagnosis not present

## 2019-08-06 NOTE — Progress Notes (Signed)
Location:  [place] Room Number: 28 Place of Service:  SNF [phone]) Provider:  Veleta Miners  MD  [name], MD  Patient Care Team: [name], MD as PCP - General (Internal Medicine) [place], [place], [facility] Mast, Man X, NP as Nurse Practitioner (Nurse Practitioner)  Extended Emergency Contact Information Primary Emergency Contact: [name] States of [place] Phone: [phone] Relation: Son Secondary Emergency Contact: [place] Phone: [phone] Mobile Phone: [phone] Relation: Daughter  Code Status:  DNR Goals of care: Advanced Directive information Advanced Directives 08/06/2019  Does Patient Have a Medical Advance Directive? Yes  Type of Paramedic of [place];Out of facility DNR (pink MOST or yellow form)  Does patient want to make changes to medical advance directive? No - Patient declined  Copy of [place] in Chart? Yes - validated most recent copy scanned in chart (See row information)  Would patient like information on creating a medical advance directive? -  Pre-existing out of facility DNR order (yellow form or pink MOST form) Yellow form placed in chart (order not valid for inpatient use)     Chief Complaint  Patient presents with   Medical Management of Chronic Issues    HPI:  Pt is a 85 y.o. female seen today for medical management of chronic diseases.    Patient has h/o Bilateral LE edema , Hypertension, Hard of hearing, h/o Cellulitis, Hypothyroidism, COPD Doing well. Had more swelling in her LE. NO cough or SOB Has gained 10 lbs she says she has good appetite No New Nursing issues. Did have 2 new Bruises on the Hands. Patient says it is due to Nurses holding too hard. She is very Hard of hearing Past Medical History:  Diagnosis Date   Abnormal liver function tests    11/11/15 AST 31, ALT 88, alk phos 96     Acute kidney failure, unspecified ([place]) 01/24/2012   Arthralgia 09/26/2014   Multiple joints: knees, shoulders, wrists, spine hips    Arthritis    Candidiasis of other urogenital sites 08/10/2012   Cerebral embolism 05/23/2005   CHF (congestive heart failure) ([place])    Cholelithiasis 11/04/2015   Closed fracture of sacrum and coccyx without mention of spinal cord injury 02/14/2012   Constipation 05/03/2013   Contusion of face, scalp, and neck except eye(s) 06/01/2012   Contusion of wrist 06/01/2012   COPD (chronic obstructive pulmonary disease) ([place])    COPD, mild ([place]) 01/20/2013   Edema 04/20/2012   H/O: CVA (cerebrovascular accident)    Hearing loss 09/26/2014   History of cancer of uterus    HTN (hypertension), benign    Hyperglycemia 11/14/2015   Hypothyroidism    Insomnia, unspecified 08/10/2012   Open wound of knee, leg (except thigh), and ankle, without mention of complication 12/16/4313   Other and unspecified hyperlipidemia 06/01/2012   Other disorder of coccyx 01/27/2012   Pain in joint, ankle and foot 06/14/2012   Peripheral vascular disease, unspecified ([place]) 01/27/2012   Personal history of fall 01/27/2012   Pneumonia, organism unspecified(486) 01/23/2005   Rheumatic fever 09/26/1933   Age 18 10/01/14 ESR 13, RAF <10    Seasonal allergies 05/03/2013   Stasis dermatitis of both legs 09/26/2014   Unspecified constipation 11/02/2012   Unspecified hearing loss 02/08/2013   Unspecified hereditary and idiopathic peripheral neuropathy 01/27/2012   Urinary frequency 02/24/2014   Ventricular fibrillation ([place]) 01/24/2012   Past Surgical History:  Procedure Laterality  Date   ABDOMINAL HYSTERECTOMY  1990   for endometrial cancer   CATARACT EXTRACTION W/ INTRAOCULAR LENS  IMPLANT, BILATERAL     ECTOPIC PREGNANCY SURGERY  1952   GUM SURGERY  1932   MASTOIDECTOMY  1920   bilateral   THYROIDECTOMY  1960   TONSILLECTOMY  1916    Allergies  Allergen  Reactions   Amoxicillin     Unknown, patient unable to answer questionnaire    Aspirin Other (See Comments)    On MAR   Avelox [Moxifloxacin]     Unknown: listed on MAR   Erythromycin     Unknown: listed on MAR   Monistat [Miconazole]     Unknown: listed on MAR   Morphine And Related     Unknown: listed on MAR   Orange Juice [Orange Oil]     Unknown: listed on MAR    Outpatient Encounter Medications as of 08/06/2019  Medication Sig   aluminum-magnesium hydroxide-simethicone (MAALOX) 854-627-03 MG/5ML SUSP Take 30 mLs by mouth every 6 (six) hours as needed.   aspirin 81 MG chewable tablet Chew 81 mg by mouth daily.   calcium citrate-vitamin D (CITRACAL+D) 315-200 MG-UNIT per tablet Take 1 tablet by mouth daily.   carboxymethylcellulose (REFRESH TEARS) 0.5 % SOLN Place 1 drop into both eyes 2 (two) times daily.   docusate sodium (COLACE) 100 MG capsule Take 200 mg by mouth at bedtime.    fexofenadine (ALLEGRA) 60 MG tablet Take 60 mg by mouth daily.   furosemide (LASIX) 20 MG tablet Take 1 tablet by mouth daily.   ipratropium-albuterol (DUONEB) 0.5-2.5 (3) MG/3ML SOLN Take 3 mLs by nebulization 2 (two) times daily.    levothyroxine (SYNTHROID, LEVOTHROID) 150 MCG tablet Take 150 mcg by mouth daily.    magnesium hydroxide (MILK OF MAGNESIA) 400 MG/5ML suspension Take 30 mLs by mouth daily as needed for mild constipation.   Melatonin 3 MG TABS Take 3 mg by mouth at bedtime.    Multiple Vitamin (MULTIVITAMIN WITH MINERALS) TABS tablet Take 1 tablet by mouth daily. [employer]ite Senior   potassium chloride (K-DUR,KLOR-CON) 10 MEQ tablet Take 10 mEq by mouth daily.    white petrolatum (VASELINE) GEL Apply 1 application topically daily. Apply to bilateral feet for dry scaly skin.   zinc oxide 20 % ointment Apply 1 application topically as needed for irritation (To buttocks after every incontinent episode and for redness.).   No facility-administered encounter medications on  file as of 08/06/2019.     Review of Systems  Constitutional: Negative.   HENT: Negative.   Respiratory: Negative.   Cardiovascular: Positive for leg swelling.  Gastrointestinal: Negative.   Genitourinary: Negative.   Musculoskeletal: Positive for arthralgias.  Skin: Positive for color change.  Neurological: Positive for weakness.  Psychiatric/Behavioral: Negative.   All other systems reviewed and are negative.   Immunization History  Administered Date(s) Administered   Influenza Whole 10/19/2012, 09/15/2018   Influenza-Unspecified 10/18/2013, 10/17/2014, 09/02/2015, 09/29/2016, 10/03/2017   PPD Test 01/22/2013, 11/28/2015   Pneumococcal Conjugate-13 11/18/2017   Pneumococcal Polysaccharide-23 01/22/2013   Td 02/21/2009   Tdap 04/07/2015   Pertinent  Health Maintenance Due  Topic Date Due   INFLUENZA VACCINE  07/14/2019   PNA vac Low Risk Adult  Completed   Fall Risk  09/05/2018 09/02/2017 11/14/2015 04/17/2015 09/26/2014  Falls in the past year? Yes No No Yes No  Number falls in past yr: 2 or more - - 1 -  Injury with Fall? Yes - - Yes -  Comment - - - laceration forehead 04/07/15 -  Risk Factor Category  - - - High Fall Risk -  Risk for fall due to : - - Impaired mobility;Impaired vision - -   Functional Status Survey:    Vitals:   08/06/19 1506  BP: 140/66  Pulse: 76  Resp: 20  Temp: 97.7 F (36.5 C)  SpO2: 94%  Weight: 165 lb (74.8 kg)  Height: 5' (1.524 m)   Body mass index is 32.22 kg/m. Constitutional: Oriented to person, place, and time. Well-developed and well-nourished.  HENT:  Head: Normocephalic.  Mouth/Throat: Oropharynx is clear and moist.  Eyes: Pupils are equal, round, and reactive to light.  Neck: Neck supple.  Cardiovascular: Normal rate and normal heart sounds.  No murmur heard. Pulmonary/Chest: Effort normal and breath sounds normal. No respiratory distress. No wheezes. She has no rales.  Abdominal: Soft. Bowel sounds are normal.  No distension. There is no tenderness. There is no rebound.  Musculoskeletal:Moderate Swelling Bilateral with some redness Also c/o Right Knee pain . Not Swollen has arhtritis Neurological: Alert and oriented to person, place, and time.  Skin: Skin is warm and dry. 2 Bruises on her top of her hand Psychiatric: Normal mood and affect. Behavior is normal. Thought content normal.    Labs reviewed: Recent Labs    08/17/18 1539 09/21/18 01/18/19  NA 138 137 139  K 4.2 4.1 4.0  CL 100 100 104  CO2 _0 GLUCOSE 112*  --   --   BUN 27* 33* 36*  CREATININE 0.98 1.0 0.9  CALCIUM 9.4 9.0 8.8   Recent Labs    08/17/18 1539 09/21/18 01/18/19  AST _1 ALT _2 ALKPHOS 60 60 54  BILITOT 0.4  --   --   PROT 6.4* 5.8 5.9  ALBUMIN 3.4* .3.5 3.6   Recent Labs    08/17/18 1539 09/12/18 09/21/18 01/18/19  WBC 8.8 7.7 6.1 6.7  NEUTROABS 6.3  --   --   --   HGB 14.7 13.5 13.8 13.6  HCT 46.3* 41 42 41  MCV 92.2  --   --   --   PLT 185 198 187 190   Lab Results  Component Value Date   TSH 1.54 09/12/2018   No results found for: HGBA1C No results found for: CHOL, HDL, LDLCALC, LDLDIRECT, TRIG, CHOLHDL  Significant Diagnostic Results in last 30 days:  No results found.  Assessment/Plan Bilateral leg edema with Weight Gain Has Increased swelling of Both Legs Will increase her Lasix to 40 mg QD Potassium increased Follow up BMP in 1 week  Hypothyroidism, unspecified type Due for repeat TSH  Essential hypertension Stable on Lasix  Bruises on the Wrist Patient concerned about Nurses D/W the DON    Family/ staff Communication:   Labs/tests ordered:  CBC,CMP,TSH in 1 week  Total time spent in this patient care encounter was  25_  minutes; greater than 50% of the visit spent counseling patient and staff, reviewing records , Labs and coordinating care for problems addressed at this encounter.

## 2019-08-13 ENCOUNTER — Encounter: Payer: Self-pay | Admitting: Nurse Practitioner

## 2019-08-13 ENCOUNTER — Non-Acute Institutional Stay (SKILLED_NURSING_FACILITY): Payer: Medicare Other | Admitting: Nurse Practitioner

## 2019-08-13 DIAGNOSIS — R410 Disorientation, unspecified: Secondary | ICD-10-CM | POA: Diagnosis not present

## 2019-08-13 DIAGNOSIS — W19XXXA Unspecified fall, initial encounter: Secondary | ICD-10-CM

## 2019-08-13 DIAGNOSIS — R2681 Unsteadiness on feet: Secondary | ICD-10-CM | POA: Diagnosis not present

## 2019-08-13 NOTE — Progress Notes (Signed)
Location:  [place] Room Number: 28A Place of Service:  SNF (31) Provider:  Kimora Stankovic Otho Darner , NP   [name], MD  Patient Care Team: [name], MD as PCP - General (Internal Medicine) [place], [place], [facility] Lacye Mccarn X, NP as Nurse Practitioner (Nurse Practitioner)  Extended Emergency Contact Information Primary Emergency Contact: [name] States of [place] Phone: [phone] Relation: Son Secondary Emergency Contact: [place] Phone: [phone] Mobile Phone: [phone] Relation: Daughter  Code Status:  DNR Goals of care: Advanced Directive information Advanced Directives 08/13/2019  Does Patient Have a Medical Advance Directive? Yes  Type of Paramedic of [place];Out of facility DNR (pink MOST or yellow form)  Does patient want to make changes to medical advance directive? No - Patient declined  Copy of [place] in Chart? Yes - validated most recent copy scanned in chart (See row information)  Would patient like information on creating a medical advance directive? -  Pre-existing out of facility DNR order (yellow form or pink MOST form) Yellow form placed in chart (order not valid for inpatient use)     Chief Complaint  Patient presents with  . Acute Visit    Fall     HPI:  Pt is a 85 y.o. female seen today for an acute visit for 08/11/19 fall w/o injury, observe on the floor sitting on right side of her recliner. She resides in [facility] for safety and care assistance, ambulates with walker, needs assistance for transfer.    Past Medical History:  Diagnosis Date  . Abnormal liver function tests    11/11/15 AST 31, ALT 88, alk phos 96   . Acute kidney failure, unspecified ([place]) 01/24/2012  . Arthralgia 09/26/2014   Multiple joints: knees, shoulders, wrists, spine hips   . Arthritis   . Candidiasis of other urogenital sites  08/10/2012  . Cerebral embolism 05/23/2005  . CHF (congestive heart failure) ([place])   . Cholelithiasis 11/04/2015  . Closed fracture of sacrum and coccyx without mention of spinal cord injury 02/14/2012  . Constipation 05/03/2013  . Contusion of face, scalp, and neck except eye(s) 06/01/2012  . Contusion of wrist 06/01/2012  . COPD (chronic obstructive pulmonary disease) ([place])   . COPD, mild ([place]) 01/20/2013  . Edema 04/20/2012  . H/O: CVA (cerebrovascular accident)   . Hearing loss 09/26/2014  . History of cancer of uterus   . HTN (hypertension), benign   . Hyperglycemia 11/14/2015  . Hypothyroidism   . Insomnia, unspecified 08/10/2012  . Open wound of knee, leg (except thigh), and ankle, without mention of complication 03/13/2877  . Other and unspecified hyperlipidemia 06/01/2012  . Other disorder of coccyx 01/27/2012  . Pain in joint, ankle and foot 06/14/2012  . Peripheral vascular disease, unspecified ([place]) 01/27/2012  . Personal history of fall 01/27/2012  . Pneumonia, organism unspecified(486) 01/23/2005  . Rheumatic fever 09/26/1933   Age 75 10/01/14 ESR 13, RAF <10   . Seasonal allergies 05/03/2013  . Stasis dermatitis of both legs 09/26/2014  . Unspecified constipation 11/02/2012  . Unspecified hearing loss 02/08/2013  . Unspecified hereditary and idiopathic peripheral neuropathy 01/27/2012  . Urinary frequency 02/24/2014  . Ventricular fibrillation ([place]) 01/24/2012   Past Surgical History:  Procedure Laterality Date  . ABDOMINAL HYSTERECTOMY  1990   for endometrial cancer  . CATARACT EXTRACTION W/ INTRAOCULAR LENS  IMPLANT, BILATERAL    . ECTOPIC PREGNANCY SURGERY  [place]  . MASTOIDECTOMY  1920   bilateral  . THYROIDECTOMY  1960  . TONSILLECTOMY  1916    Allergies  Allergen Reactions  . Amoxicillin     Unknown, patient unable to answer questionnaire   . Aspirin Other (See Comments)    On MAR  . Avelox [Moxifloxacin]     Unknown: listed on MAR  . Erythromycin      Unknown: listed on MAR  . Monistat [Miconazole]     Unknown: listed on MAR  . Morphine And Related     Unknown: listed on MAR  . Orange Juice [Orange Oil]     Unknown: listed on [facility]    Outpatient Encounter Medications as of 08/13/2019  Medication Sig  . aluminum-magnesium hydroxide-simethicone (MAALOX) 798-921-19 MG/5ML SUSP Take 30 mLs by mouth every 6 (six) hours as needed.  Marland Kitchen aspirin 81 MG chewable tablet Chew 81 mg by mouth daily.  . calcium citrate-vitamin D (CITRACAL+D) 315-200 MG-UNIT per tablet Take 1 tablet by mouth daily.  . carboxymethylcellulose (REFRESH TEARS) 0.5 % SOLN Place 1 drop into both eyes 2 (two) times daily.  Marland Kitchen docusate sodium (COLACE) 100 MG capsule Take 200 mg by mouth at bedtime.   . fexofenadine (ALLEGRA) 60 MG tablet Take 60 mg by mouth daily.  . furosemide (LASIX) 20 MG tablet Take 40 mg by mouth daily.   Marland Kitchen ipratropium-albuterol (DUONEB) 0.5-2.5 (3) MG/3ML SOLN Take 3 mLs by nebulization 2 (two) times daily.   Marland Kitchen levothyroxine (SYNTHROID, LEVOTHROID) 150 MCG tablet Take 150 mcg by mouth daily.   . magnesium hydroxide (MILK OF MAGNESIA) 400 MG/5ML suspension Take 30 mLs by mouth daily as needed for mild constipation.  . Melatonin 3 MG TABS Take 3 mg by mouth at bedtime.   . Multiple Vitamin (MULTIVITAMIN WITH MINERALS) TABS tablet Take 1 tablet by mouth daily. Radiation protection practitioner  . potassium chloride SA (K-DUR) 20 MEQ tablet Take 20 mEq by mouth daily.  . white petrolatum (VASELINE) GEL Apply 1 application topically daily. Apply to bilateral feet for dry scaly skin.  Marland Kitchen zinc oxide 20 % ointment Apply 1 application topically as needed for irritation (To buttocks after every incontinent episode and for redness.).  . [DISCONTINUED] potassium chloride (K-DUR,KLOR-CON) 10 MEQ tablet Take 10 mEq by mouth daily.    No facility-administered encounter medications on file as of 08/13/2019.    ROS was provided with assistance of staff.  Review of Systems   Constitutional: Negative for activity change, appetite change, chills, diaphoresis and fever.  HENT: Positive for hearing loss. Negative for congestion and voice change.   Eyes: Negative for visual disturbance.  Respiratory: Negative for cough, shortness of breath and wheezing.   Cardiovascular: Positive for leg swelling. Negative for chest pain and palpitations.  Gastrointestinal: Negative for abdominal distention, constipation, diarrhea, nausea and vomiting.  Genitourinary: Negative for difficulty urinating and urgency.  Musculoskeletal: Positive for arthralgias and gait problem.  Skin: Positive for color change.       Left hand bruise.   Neurological: Negative for dizziness, facial asymmetry, speech difficulty, weakness and headaches.       Memory lapses.   Psychiatric/Behavioral: Positive for dysphoric mood. Negative for agitation, behavioral problems, hallucinations and sleep disturbance. The patient is not nervous/anxious.     Immunization History  Administered Date(s) Administered  . Influenza Whole 10/19/2012, 09/15/2018  . Influenza-Unspecified 10/18/2013, 10/17/2014, 09/02/2015, 09/29/2016, 10/03/2017  . PPD Test 01/22/2013, 11/28/2015  . Pneumococcal Conjugate-13 11/18/2017  .  Pneumococcal Polysaccharide-23 01/22/2013  . Td 02/21/2009  . Tdap 04/07/2015   Pertinent  Health Maintenance Due  Topic Date Due  . INFLUENZA VACCINE  07/14/2019  . PNA vac Low Risk Adult  Completed   Fall Risk  09/05/2018 09/02/2017 11/14/2015 04/17/2015 09/26/2014  Falls in the past year? Yes No No Yes No  Number falls in past yr: 2 or more - - 1 -  Injury with Fall? Yes - - Yes -  Comment - - - laceration forehead 04/07/15 -  Risk Factor Category  - - - High Fall Risk -  Risk for fall due to : - - Impaired mobility;Impaired vision - -   Functional Status Survey:    Vitals:   08/13/19 1716  BP: 120/80  Pulse: 76  Resp: 18  Temp: (!) 97.4 F (36.3 C)  TempSrc: Oral  SpO2: 94%  Weight:  165 lb (74.8 kg)  Height: 5' (1.524 m)   Body mass index is 32.22 kg/m. Physical Exam Vitals signs and nursing note reviewed.  Constitutional:      General: She is not in acute distress.    Appearance: Normal appearance. She is not ill-appearing, toxic-appearing or diaphoretic.  HENT:     Head: Normocephalic and atraumatic.     Nose: Nose normal.     Mouth/Throat:     Mouth: Mucous membranes are moist.  Eyes:     Extraocular Movements: Extraocular movements intact.     Conjunctiva/sclera: Conjunctivae normal.     Pupils: Pupils are equal, round, and reactive to light.  Neck:     Musculoskeletal: Normal range of motion and neck supple.  Cardiovascular:     Rate and Rhythm: Normal rate.     Heart sounds: No murmur.  Pulmonary:     Breath sounds: No wheezing, rhonchi or rales.  Chest:     Chest wall: No tenderness.  Abdominal:     General: There is no distension.     Palpations: Abdomen is soft.     Tenderness: There is no abdominal tenderness. There is no right CVA tenderness, guarding or rebound.  Musculoskeletal:     Right lower leg: Edema present.     Left lower leg: Edema present.     Comments: 1+ edema, needs assistance/supervision for transfer, ambulation with walker.   Skin:    Findings: Bruising present.     Comments: Left hand bruise.   Neurological:     General: No focal deficit present.     Mental Status: She is alert. Mental status is at baseline.     Cranial Nerves: No cranial nerve deficit.     Motor: No weakness.     Coordination: Coordination normal.     Gait: Gait abnormal.     Comments: Oriented to person, place.   Psychiatric:        Mood and Affect: Mood normal.        Behavior: Behavior normal.        Thought Content: Thought content normal.     Labs reviewed: Recent Labs    08/17/18 1539 09/21/18 01/18/19  NA 138 137 139  K 4.2 4.1 4.0  CL 100 100 104  CO2 _0 GLUCOSE 112*  --   --   BUN 27* 33* 36*  CREATININE 0.98 1.0 0.9   CALCIUM 9.4 9.0 8.8   Recent Labs    08/17/18 1539 09/21/18 01/18/19  AST _1 ALT _2 ALKPHOS 60 60  54  BILITOT 0.4  --   --   PROT 6.4* 5.8 5.9  ALBUMIN 3.4* .3.5 3.6   Recent Labs    08/17/18 1539 09/12/18 09/21/18 01/18/19  WBC 8.8 7.7 6.1 6.7  NEUTROABS 6.3  --   --   --   HGB 14.7 13.5 13.8 13.6  HCT 46.3* 41 42 41  MCV 92.2  --   --   --   PLT 185 198 187 190   Lab Results  Component Value Date   TSH 1.54 09/12/2018   Significant Diagnostic Results in last 30 days:   Assessment/Plan Fall 08/11/19 fall w/o injury, observe on the floor sitting on right side of her recliner. Lack of safety awareness, unsteady gait, increased frailty are contributory, close supervision needed.   Gait instability Needs assistance, supervision for transfer, ambulates with walker.   Confusion Periodically, needs close supervision for safety, continue SNF [facility] for safety, care needs.      Family/ staff Communication: plan of care reviewed with the patient and Engineer, site.   Labs/tests ordered:  None  Time spend 25 minutes.

## 2019-08-14 ENCOUNTER — Encounter: Payer: Self-pay | Admitting: Nurse Practitioner

## 2019-08-14 NOTE — Assessment & Plan Note (Signed)
08/11/19 fall w/o injury, observe on the floor sitting on right side of her recliner. Lack of safety awareness, unsteady gait, increased frailty are contributory, close supervision needed.

## 2019-08-14 NOTE — Assessment & Plan Note (Signed)
Needs assistance, supervision for transfer, ambulates with walker.

## 2019-08-14 NOTE — Assessment & Plan Note (Signed)
Periodically, needs close supervision for safety, continue SNF [facility] for safety, care needs.

## 2019-08-15 ENCOUNTER — Encounter: Payer: Self-pay | Admitting: Nurse Practitioner

## 2019-08-15 DIAGNOSIS — N183 Chronic kidney disease, stage 3 unspecified: Secondary | ICD-10-CM | POA: Insufficient documentation

## 2019-08-15 LAB — BASIC METABOLIC PANEL
BUN: 38 — AB (ref 4–21)
Creatinine: 1.1 (ref 0.5–1.1)
Glucose: 100
Potassium: 4.4 (ref 3.4–5.3)
Sodium: 137 (ref 137–147)

## 2019-08-15 LAB — CBC AND DIFFERENTIAL
HCT: 46 (ref 36–46)
Hemoglobin: 14.9 (ref 12.0–16.0)
Platelets: 232 (ref 150–399)
WBC: 10.6

## 2019-08-15 LAB — TSH: TSH: 2.72 (ref 0.41–5.90)

## 2019-08-15 LAB — HEPATIC FUNCTION PANEL
ALT: 17 (ref 7–35)
AST: 25 (ref 13–35)
Alkaline Phosphatase: 64 (ref 25–125)
Bilirubin, Total: 0.4

## 2019-08-27 ENCOUNTER — Non-Acute Institutional Stay (SKILLED_NURSING_FACILITY): Payer: Medicare Other | Admitting: Nurse Practitioner

## 2019-08-27 ENCOUNTER — Encounter: Payer: Self-pay | Admitting: Nurse Practitioner

## 2019-08-27 DIAGNOSIS — R600 Localized edema: Secondary | ICD-10-CM

## 2019-08-27 DIAGNOSIS — H01005 Unspecified blepharitis left lower eyelid: Secondary | ICD-10-CM | POA: Diagnosis not present

## 2019-08-27 DIAGNOSIS — H01002 Unspecified blepharitis right lower eyelid: Secondary | ICD-10-CM

## 2019-08-27 NOTE — Assessment & Plan Note (Signed)
Will treat with Maxitrol ointment qhs R+L eyes x 2wks, OcuSoft lid Scrub daily. Observe .

## 2019-08-27 NOTE — Progress Notes (Signed)
Location:  [place] Room Number: 28A Place of Service:  SNF (31) Provider:  Capricia Serda Otho Darner, NP  [name], MD  Patient Care Team: [name], MD as PCP - General (Internal Medicine) [place], [place], [facility] Jerauld Bostwick X, NP as Nurse Practitioner (Nurse Practitioner)  Extended Emergency Contact Information Primary Emergency Contact: [name] States of [place] Phone: [phone] Relation: Son Secondary Emergency Contact: [place] Phone: [phone] Mobile Phone: [phone] Relation: Daughter  Code Status:  DNR Goals of care: Advanced Directive information Advanced Directives 08/27/2019  Does Patient Have a Medical Advance Directive? Yes  Type of Paramedic of [place];Living will;Out of facility DNR (pink MOST or yellow form)  Does patient want to make changes to medical advance directive? No - Patient declined  Copy of [place] in Chart? Yes - validated most recent copy scanned in chart (See row information)  Would patient like information on creating a medical advance directive? -  Pre-existing out of facility DNR order (yellow form or pink MOST form) Yellow form placed in chart (order not valid for inpatient use)     Chief Complaint  Patient presents with  . Acute Visit    Drainage from both eyes    HPI:  Pt is a 85 y.o. female seen today for an acute visit for bil eye drainage, mattered eyelashes, denied eye pain/irritation, change of vision, no noted injected conjunctivae. Hx of blepharitis, Maxitrol was effective. Chronic edema BLE, stable, on Furosemide 48m qd.    Past Medical History:  Diagnosis Date  . Abnormal liver function tests    11/11/15 AST 31, ALT 88, alk phos 96   . Acute kidney failure, unspecified ([place] 01/24/2012  . Arthralgia 09/26/2014   Multiple joints: knees, shoulders, wrists, spine hips   . Arthritis   .  Candidiasis of other urogenital sites 08/10/2012  . Cerebral embolism 05/23/2005  . CHF (congestive heart failure) ([place]   . Cholelithiasis 11/04/2015  . Closed fracture of sacrum and coccyx without mention of spinal cord injury 02/14/2012  . Constipation 05/03/2013  . Contusion of face, scalp, and neck except eye(s) 06/01/2012  . Contusion of wrist 06/01/2012  . COPD (chronic obstructive pulmonary disease) ([place]   . COPD, mild ([place] 01/20/2013  . Edema 04/20/2012  . H/O: CVA (cerebrovascular accident)   . Hearing loss 09/26/2014  . History of cancer of uterus   . HTN (hypertension), benign   . Hyperglycemia 11/14/2015  . Hypothyroidism   . Insomnia, unspecified 08/10/2012  . Open wound of knee, leg (except thigh), and ankle, without mention of complication 54/01/5955 . Other and unspecified hyperlipidemia 06/01/2012  . Other disorder of coccyx 01/27/2012  . Pain in joint, ankle and foot 06/14/2012  . Peripheral vascular disease, unspecified ([place] 01/27/2012  . Personal history of fall 01/27/2012  . Pneumonia, organism unspecified(486) 01/23/2005  . Rheumatic fever 09/26/1933   Age 53 10/01/14 ESR 13, RAF <10   . Seasonal allergies 05/03/2013  . Stasis dermatitis of both legs 09/26/2014  . Unspecified constipation 11/02/2012  . Unspecified hearing loss 02/08/2013  . Unspecified hereditary and idiopathic peripheral neuropathy 01/27/2012  . Urinary frequency 02/24/2014  . Ventricular fibrillation ([place] 01/24/2012   Past Surgical History:  Procedure Laterality Date  . ABDOMINAL HYSTERECTOMY  1990   for endometrial cancer  . CATARACT EXTRACTION W/ INTRAOCULAR LENS  IMPLANT, BILATERAL    [place]  GUM SURGERY  1932  . MASTOIDECTOMY  1920   bilateral  . THYROIDECTOMY  1960  . TONSILLECTOMY  1916    Allergies  Allergen Reactions  . Amoxicillin     Unknown, patient unable to answer questionnaire   . Aspirin Other (See Comments)    On MAR  . Avelox [Moxifloxacin]      Unknown: listed on MAR  . Erythromycin     Unknown: listed on MAR  . Monistat [Miconazole]     Unknown: listed on MAR  . Morphine And Related     Unknown: listed on MAR  . Orange Juice [Orange Oil]     Unknown: listed on [facility]    Outpatient Encounter Medications as of 08/27/2019  Medication Sig  . aluminum-magnesium hydroxide-simethicone (MAALOX) 517-001-74 MG/5ML SUSP Take 30 mLs by mouth every 6 (six) hours as needed.  Marland Kitchen aspirin 81 MG chewable tablet Chew 81 mg by mouth daily.  . calcium citrate-vitamin D (CITRACAL+D) 315-200 MG-UNIT per tablet Take 1 tablet by mouth daily.  . carboxymethylcellulose (REFRESH TEARS) 0.5 % SOLN Place 1 drop into both eyes 2 (two) times daily.  Marland Kitchen docusate sodium (COLACE) 100 MG capsule Take 200 mg by mouth at bedtime.   . fexofenadine (ALLEGRA) 60 MG tablet Take 60 mg by mouth daily.  . furosemide (LASIX) 20 MG tablet Take 40 mg by mouth daily.   Marland Kitchen ipratropium-albuterol (DUONEB) 0.5-2.5 (3) MG/3ML SOLN Take 3 mLs by nebulization 2 (two) times daily.   Marland Kitchen levothyroxine (SYNTHROID, LEVOTHROID) 150 MCG tablet Take 150 mcg by mouth daily.   . magnesium hydroxide (MILK OF MAGNESIA) 400 MG/5ML suspension Take 30 mLs by mouth daily as needed for mild constipation.  . Melatonin 3 MG TABS Take 3 mg by mouth at bedtime.   . Multiple Vitamin (MULTIVITAMIN WITH MINERALS) TABS tablet Take 1 tablet by mouth daily. Radiation protection practitioner  . potassium chloride SA (K-DUR) 20 MEQ tablet Take 20 mEq by mouth daily.  . white petrolatum (VASELINE) GEL Apply 1 application topically daily. Apply to bilateral feet for dry scaly skin.  Marland Kitchen zinc oxide 20 % ointment Apply 1 application topically as needed for irritation (To buttocks after every incontinent episode and for redness.).   No facility-administered encounter medications on file as of 08/27/2019.    ROS was provided with assistance of staff.  Review of Systems  Constitutional: Negative for activity change, appetite change, chills,  diaphoresis, fatigue and fever.  HENT: Positive for hearing loss. Negative for congestion and voice change.   Eyes: Positive for discharge. Negative for photophobia, pain, redness, itching and visual disturbance.       Mattered eyelashes.   Respiratory: Negative for cough, shortness of breath and wheezing.   Cardiovascular: Positive for leg swelling. Negative for chest pain and palpitations.  Gastrointestinal: Negative for abdominal distention, abdominal pain, constipation, diarrhea, nausea and vomiting.  Genitourinary: Negative for difficulty urinating, dysuria and urgency.  Musculoskeletal: Positive for arthralgias and gait problem.  Skin: Negative for color change and pallor.  Neurological: Negative for dizziness, facial asymmetry, speech difficulty, weakness and light-headedness.       Memory lapses.   Psychiatric/Behavioral: Positive for dysphoric mood. Negative for agitation, behavioral problems, hallucinations and sleep disturbance. The patient is not nervous/anxious.     Immunization History  Administered Date(s) Administered  . Influenza Whole 10/19/2012, 09/15/2018  . Influenza-Unspecified 10/18/2013, 10/17/2014, 09/02/2015, 09/29/2016, 10/03/2017  . PPD Test 01/22/2013, 11/28/2015  . Pneumococcal Conjugate-13 11/18/2017  . Pneumococcal Polysaccharide-23 01/22/2013  .  Td 02/21/2009  . Tdap 04/07/2015   Pertinent  Health Maintenance Due  Topic Date Due  . INFLUENZA VACCINE  07/14/2019  . PNA vac Low Risk Adult  Completed   Fall Risk  09/05/2018 09/02/2017 11/14/2015 04/17/2015 09/26/2014  Falls in the past year? Yes No No Yes No  Number falls in past yr: 2 or more - - 1 -  Injury with Fall? Yes - - Yes -  Comment - - - laceration forehead 04/07/15 -  Risk Factor Category  - - - High Fall Risk -  Risk for fall due to : - - Impaired mobility;Impaired vision - -   Functional Status Survey:    Vitals:   08/27/19 1444  BP: 120/70  Pulse: 74  Resp: 18  Temp: (!) 97.4 F  (36.3 C)  TempSrc: Oral  SpO2: 96%  Weight: 165 lb (74.8 kg)  Height: 5' (1.524 m)   Body mass index is 32.22 kg/m. Physical Exam Constitutional:      Appearance: Normal appearance.  HENT:     Head: Normocephalic and atraumatic.     Nose: Nose normal.     Mouth/Throat:     Mouth: Mucous membranes are moist.  Eyes:     General:        Right eye: Discharge present.        Left eye: Discharge present.    Extraocular Movements: Extraocular movements intact.     Conjunctiva/sclera: Conjunctivae normal.     Pupils: Pupils are equal, round, and reactive to light.     Comments: Yellow mattered eyelashes,   Neck:     Musculoskeletal: Normal range of motion and neck supple.  Cardiovascular:     Rate and Rhythm: Normal rate and regular rhythm.     Heart sounds: No murmur.  Pulmonary:     Breath sounds: No wheezing, rhonchi or rales.  Abdominal:     General: Bowel sounds are normal. There is no distension.     Palpations: Abdomen is soft.     Tenderness: There is no abdominal tenderness. There is no right CVA tenderness, left CVA tenderness, guarding or rebound.  Musculoskeletal:     Right lower leg: Edema present.     Left lower leg: Edema present.     Comments: Trace edema BLE  Skin:    General: Skin is warm and dry.  Neurological:     General: No focal deficit present.     Mental Status: She is alert. Mental status is at baseline.     Cranial Nerves: No cranial nerve deficit.     Motor: No weakness.     Coordination: Coordination normal.     Gait: Gait abnormal.     Comments: Oriented to person, place.   Psychiatric:        Mood and Affect: Mood normal.        Behavior: Behavior normal.        Thought Content: Thought content normal.     Labs reviewed: Recent Labs    09/21/18 01/18/19 08/15/19  NA 137 139 137  K 4.1 4.0 4.4  CL 100 104  --   CO2 30 29  --   BUN 33* 36* 38*  CREATININE 1.0 0.9 1.1  CALCIUM 9.0 8.8  --    Recent Labs    09/21/18 01/18/19  08/15/19  AST 27 17 25   ALT 30 14 17   ALKPHOS 60 54 64  PROT 5.8 5.9  --   ALBUMIN .3.5 3.6  --  Recent Labs    09/21/18 01/18/19 08/15/19  WBC 6.1 6.7 10.6  HGB 13.8 13.6 14.9  HCT 42 41 46  PLT 187 190 232   Lab Results  Component Value Date   TSH 2.72 08/15/2019   No results found for: HGBA1C No results found for: CHOL, HDL, LDLCALC, LDLDIRECT, TRIG, CHOLHDL  Significant Diagnostic Results in last 30 days:   Assessment/Plan Blepharitis of both eyes Will treat with Maxitrol ointment qhs R+L eyes x 2wks, OcuSoft lid Scrub daily. Observe .  Bilateral leg edema Trace edema BLE, continue Furosemide 16m qd.     Family/ staff Communication: plan of care reviewed with the patient and charge nurse.   Labs/tests ordered:  None  Time spend 25 minutes.

## 2019-08-27 NOTE — Assessment & Plan Note (Signed)
Trace edema BLE, continue Furosemide 40mg qd.  

## 2019-09-07 ENCOUNTER — Non-Acute Institutional Stay (SKILLED_NURSING_FACILITY): Payer: Medicare Other | Admitting: Nurse Practitioner

## 2019-09-07 ENCOUNTER — Encounter: Payer: Self-pay | Admitting: Nurse Practitioner

## 2019-09-07 DIAGNOSIS — E039 Hypothyroidism, unspecified: Secondary | ICD-10-CM | POA: Diagnosis not present

## 2019-09-07 DIAGNOSIS — I739 Peripheral vascular disease, unspecified: Secondary | ICD-10-CM

## 2019-09-07 DIAGNOSIS — K5901 Slow transit constipation: Secondary | ICD-10-CM | POA: Diagnosis not present

## 2019-09-07 DIAGNOSIS — J449 Chronic obstructive pulmonary disease, unspecified: Secondary | ICD-10-CM | POA: Diagnosis not present

## 2019-09-07 NOTE — Assessment & Plan Note (Signed)
Stable, continue DuoNeb bid.  

## 2019-09-07 NOTE — Progress Notes (Signed)
Location:  [place] Room Number: 28 Place of Service:  SNF (31) Provider:  Marlana Latus  NP  [name], MD  Patient Care Team: [name], MD as PCP - General (Internal Medicine) [place], [place], [facility] Mast, Man X, NP as Nurse Practitioner (Nurse Practitioner)  Extended Emergency Contact Information Primary Emergency Contact: [name] States of [place] Phone: [phone] Relation: Son Secondary Emergency Contact: [place] Phone: [phone] Mobile Phone: [phone] Relation: Daughter  Code Status:  DNR Goals of care: Advanced Directive information Advanced Directives 08/27/2019  Does Patient Have a Medical Advance Directive? Yes  Type of Paramedic of [place];Living will;Out of facility DNR (pink MOST or yellow form)  Does patient want to make changes to medical advance directive? No - Patient declined  Copy of [place] in Chart? Yes - validated most recent copy scanned in chart (See row information)  Would patient like information on creating a medical advance directive? -  Pre-existing out of facility DNR order (yellow form or pink MOST form) Yellow form placed in chart (order not valid for inpatient use)     Chief Complaint  Patient presents with  . Medical Management of Chronic Issues    HPI:  Pt is a 85 y.o. female seen today for medical management of chronic diseases.    The patient has Hx of hypothyroidism, last TSH 2.72 08/15/19, taking Levothyroxine 150mg qd. Hx of constipation, stable, on Colace 2020mqd. Edema BLE, stable, on Furosemide 4074md. COPD, stable, on DuoNeb bid.   Past Medical History:  Diagnosis Date  . Abnormal liver function tests    11/11/15 AST 31, ALT 88, alk phos 96   . Acute kidney failure, unspecified (HC[place]ed [place]/10/2012  . Arthralgia 09/26/2014   Multiple joints: knees, shoulders, wrists, spine hips    . Arthritis   . Candidiasis of other urogenital sites 08/10/2012  . Cerebral embolism 05/23/2005  . CHF (congestive heart failure) ([place]CC[place] . Cholelithiasis 11/04/2015  . Closed fracture of sacrum and coccyx without mention of spinal cord injury 02/14/2012  . Constipation 05/03/2013  . Contusion of face, scalp, and neck except eye(s) 06/01/2012  . Contusion of wrist 06/01/2012  . COPD (chronic obstructive pulmonary disease) ([place] . COPD, mild ([place]CC[place]/07/2013  . Edema 04/20/2012  . H/O: CVA (cerebrovascular accident)   . Hearing loss 09/26/2014  . History of cancer of uterus   . HTN (hypertension), benign   . Hyperglycemia 11/14/2015  . Hypothyroidism   . Insomnia, unspecified 08/10/2012  . Open wound of knee, leg (except thigh), and ankle, without mention of complication 5/92/04/6388 Other and unspecified hyperlipidemia 06/01/2012  . Other disorder of coccyx 01/27/2012  . Pain in joint, ankle and foot 06/14/2012  . Peripheral vascular disease, unspecified (HCC[place] [facility] [place]/14/2013  . Personal history of fall 01/27/2012  . Pneumonia, organism unspecified(486) 01/23/2005  . Rheumatic fever 09/26/1933   Age 55 10/01/14 ESR 13, RAF <10   . Seasonal allergies 05/03/2013  . Stasis dermatitis of both legs 09/26/2014  . Unspecified constipation 11/02/2012  . Unspecified hearing loss 02/08/2013  . Unspecified hereditary and idiopathic peripheral neuropathy 01/27/2012  . Urinary frequency 02/24/2014  . Ventricular fibrillation (HCC[place]ass[place]/10/2012   Past Surgical History:  Procedure Laterality Date  . ABDOMINAL HYSTERECTOMY  1990   for endometrial cancer  . CATARACT EXTRACTION W/ INTRAOCULAR LENS  IMPLANT, BILATERAL    . ECTOPIC PREGNANCY  SURGERY  1952  . GUM SURGERY  1932  . MASTOIDECTOMY  1920   bilateral  . THYROIDECTOMY  1960  . TONSILLECTOMY  1916    Allergies  Allergen Reactions  . Amoxicillin     Unknown, patient unable to answer questionnaire   . Aspirin Other (See Comments)    On MAR  . Avelox  [Moxifloxacin]     Unknown: listed on MAR  . Erythromycin     Unknown: listed on MAR  . Monistat [Miconazole]     Unknown: listed on MAR  . Morphine And Related     Unknown: listed on MAR  . Orange Juice [Orange Oil]     Unknown: listed on [facility]    Outpatient Encounter Medications as of 09/07/2019  Medication Sig  . acetaminophen (TYLENOL) 325 MG tablet Take 650 mg by mouth every 4 (four) hours as needed for mild pain.  Marland Kitchen aluminum-magnesium hydroxide-simethicone (MAALOX) 263-335-45 MG/5ML SUSP Take 30 mLs by mouth every 6 (six) hours as needed.  Marland Kitchen aspirin 81 MG chewable tablet Chew 81 mg by mouth daily.  . calcium citrate-vitamin D (CITRACAL+D) 315-200 MG-UNIT per tablet Take 1 tablet by mouth daily.  . carboxymethylcellulose (REFRESH TEARS) 0.5 % SOLN Place 1 drop into both eyes 2 (two) times daily.  Marland Kitchen docusate sodium (COLACE) 100 MG capsule Take 200 mg by mouth at bedtime.   . Eyelid Cleansers (OCUSOFT LID SCRUB ALLERGY) PADS 1 WIPE PER EYE,CLEANSE BILATERAL LIDS TWICE DAILY FOR EXCESS EYE SECRETIONS  . fexofenadine (ALLEGRA) 60 MG tablet Take 60 mg by mouth daily.  . furosemide (LASIX) 20 MG tablet Take 40 mg by mouth daily.   Marland Kitchen ipratropium-albuterol (DUONEB) 0.5-2.5 (3) MG/3ML SOLN Take 3 mLs by nebulization 2 (two) times daily.   Marland Kitchen levothyroxine (SYNTHROID, LEVOTHROID) 150 MCG tablet Take 150 mcg by mouth daily.   . magnesium hydroxide (MILK OF MAGNESIA) 400 MG/5ML suspension Take 30 mLs by mouth daily as needed for mild constipation.  . Melatonin 3 MG TABS Take 3 mg by mouth at bedtime.   . Multiple Vitamin (MULTIVITAMIN WITH MINERALS) TABS tablet Take 1 tablet by mouth daily. Radiation protection practitioner  . neomycin-polymyxin-dexameth (MAXITROL) 0.1 % OINT Place 1 application into both eyes at bedtime.  . potassium chloride SA (K-DUR) 20 MEQ tablet Take 20 mEq by mouth daily.  . white petrolatum (VASELINE) GEL Apply 1 application topically daily. Apply to bilateral feet for dry scaly skin.  Marland Kitchen  zinc oxide 20 % ointment Apply 1 application topically as needed for irritation (To buttocks after every incontinent episode and for redness.).   No facility-administered encounter medications on file as of 09/07/2019.    ROS was provided with assistance of staff.  Review of Systems  Constitutional: Negative for activity change, appetite change, chills, diaphoresis, fatigue, fever and unexpected weight change.       Same weight as in May 2020  HENT: Positive for hearing loss. Negative for congestion and voice change.   Respiratory: Negative for cough, shortness of breath and wheezing.   Cardiovascular: Positive for leg swelling. Negative for chest pain and palpitations.  Gastrointestinal: Negative for abdominal distention, abdominal pain, constipation, diarrhea, nausea and vomiting.  Genitourinary: Negative for difficulty urinating, dysuria and urgency.  Musculoskeletal: Positive for arthralgias and gait problem.  Skin: Negative for color change and pallor.  Neurological: Negative for dizziness, weakness and headaches.       Memory lapses.   Psychiatric/Behavioral: Negative for agitation, behavioral problems and hallucinations. The patient is  not nervous/anxious.     Immunization History  Administered Date(s) Administered  . Influenza Whole 10/19/2012, 09/15/2018  . Influenza-Unspecified 10/18/2013, 10/17/2014, 09/02/2015, 09/29/2016, 10/03/2017  . PPD Test 01/22/2013, 11/28/2015  . Pneumococcal Conjugate-13 11/18/2017  . Pneumococcal Polysaccharide-23 01/22/2013  . Td 02/21/2009  . Tdap 04/07/2015   Pertinent  Health Maintenance Due  Topic Date Due  . INFLUENZA VACCINE  07/14/2019  . PNA vac Low Risk Adult  Completed   Fall Risk  09/05/2018 09/02/2017 11/14/2015 04/17/2015 09/26/2014  Falls in the past year? Yes No No Yes No  Number falls in past yr: 2 or more - - 1 -  Injury with Fall? Yes - - Yes -  Comment - - - laceration forehead 04/07/15 -  Risk Factor Category  - - - High  Fall Risk -  Risk for fall due to : - - Impaired mobility;Impaired vision - -   Functional Status Survey:    Vitals:   09/07/19 0917  BP: 120/70  Pulse: 76  Resp: 18  Temp: (!) 97.1 F (36.2 C)  SpO2: 93%  Weight: 153 lb 3.2 oz (69.5 kg)  Height: 5' (1.524 m)   Body mass index is 29.92 kg/m. Physical Exam Vitals signs and nursing note reviewed.  Constitutional:      General: She is not in acute distress.    Appearance: Normal appearance. She is not ill-appearing, toxic-appearing or diaphoretic.     Comments: Over weight  HENT:     Head: Normocephalic and atraumatic.     Nose: Nose normal.     Mouth/Throat:     Mouth: Mucous membranes are moist.  Eyes:     Extraocular Movements: Extraocular movements intact.     Conjunctiva/sclera: Conjunctivae normal.     Pupils: Pupils are equal, round, and reactive to light.  Neck:     Musculoskeletal: Normal range of motion and neck supple.  Cardiovascular:     Rate and Rhythm: Normal rate and regular rhythm.  Pulmonary:     Breath sounds: No wheezing, rhonchi or rales.  Abdominal:     General: Bowel sounds are normal. There is no distension.     Palpations: Abdomen is soft.     Tenderness: There is no abdominal tenderness. There is no right CVA tenderness, left CVA tenderness, guarding or rebound.  Musculoskeletal:     Right lower leg: Edema present.     Left lower leg: Edema present.     Comments: Trace edema BLE  Skin:    General: Skin is warm and dry.  Neurological:     Mental Status: She is alert. Mental status is at baseline.     Cranial Nerves: No cranial nerve deficit.     Coordination: Coordination normal.     Gait: Gait abnormal.     Comments: Oriented to person, place.  Psychiatric:        Mood and Affect: Mood normal.        Behavior: Behavior normal.        Thought Content: Thought content normal.     Labs reviewed: Recent Labs    09/21/18 01/18/19 08/15/19  NA 137 139 137  K 4.1 4.0 4.4  CL 100 104   --   CO2 30 29  --   BUN 33* 36* 38*  CREATININE 1.0 0.9 1.1  CALCIUM 9.0 8.8  --    Recent Labs    09/21/18 01/18/19 08/15/19  AST 27 17 25   ALT 30 14 17   ALKPHOS  60 54 64  PROT 5.8 5.9  --   ALBUMIN .3.5 3.6  --    Recent Labs    09/21/18 01/18/19 08/15/19  WBC 6.1 6.7 10.6  HGB 13.8 13.6 14.9  HCT 42 41 46  PLT 187 190 232   Lab Results  Component Value Date   TSH 2.72 08/15/2019   No results found for: HGBA1C No results found for: CHOL, HDL, LDLCALC, LDLDIRECT, TRIG, CHOLHDL  Significant Diagnostic Results in last 30 days:  No results found.  Assessment/Plan PVD (peripheral vascular disease) (HCC) Chronic, remains no change, continue Furosemide 53m qd.   COPD, mild (HCC) Stable, continue DuoNeb bid.   Hypothyroidism Stable, last TSH 2.72 08/15/19, continue Levothyroxine 1536m qd.   Constipation Stable, continue Colace 20032md.      Family/ staff Communication: plan of care reviewed with the patient and charge nurse.   Labs/tests ordered: none  Time spend 25 minutes.

## 2019-09-07 NOTE — Assessment & Plan Note (Signed)
Stable, last TSH 2.72 08/15/19, continue Levothyroxine 163mcg qd.

## 2019-09-07 NOTE — Assessment & Plan Note (Signed)
Chronic, remains no change, continue Furosemide 40mg  qd.

## 2019-09-07 NOTE — Assessment & Plan Note (Signed)
Stable, continue Colace 200mg  qd.

## 2019-10-10 ENCOUNTER — Non-Acute Institutional Stay (SKILLED_NURSING_FACILITY): Payer: Medicare Other | Admitting: Nurse Practitioner

## 2019-10-10 ENCOUNTER — Encounter: Payer: Self-pay | Admitting: Nurse Practitioner

## 2019-10-10 DIAGNOSIS — E890 Postprocedural hypothyroidism: Secondary | ICD-10-CM

## 2019-10-10 DIAGNOSIS — K5900 Constipation, unspecified: Secondary | ICD-10-CM | POA: Diagnosis not present

## 2019-10-10 DIAGNOSIS — I872 Venous insufficiency (chronic) (peripheral): Secondary | ICD-10-CM | POA: Diagnosis not present

## 2019-10-10 DIAGNOSIS — J449 Chronic obstructive pulmonary disease, unspecified: Secondary | ICD-10-CM

## 2019-10-10 DIAGNOSIS — R609 Edema, unspecified: Secondary | ICD-10-CM

## 2019-10-10 NOTE — Progress Notes (Signed)
Location:   SNF [place] Room Number: 70 Place of Service:  SNF (31) Provider:  Sharay Bellissimo NP  [name], MD  Patient Care Team: [name], MD as PCP - General (Internal Medicine) [place], [place], [facility] [place] Jorgensen X, NP as Nurse Practitioner (Nurse Practitioner)  Extended Emergency Contact Information Primary Emergency Contact: [name] States of [place] Phone: [phone] Relation: Son Secondary Emergency Contact: [place] Phone: [phone] Mobile Phone: [phone] Relation: Daughter  Code Status:  DNR Goals of care: Advanced Directive information Advanced Directives 08/27/2019  Does Patient Have a Medical Advance Directive? Yes  Type of Paramedic of [place];Living will;Out of facility DNR (pink MOST or yellow form)  Does patient want to make changes to medical advance directive? No - Patient declined  Copy of [place] in Chart? Yes - validated most recent copy scanned in chart (See row information)  Would patient like information on creating a medical advance directive? -  Pre-existing out of facility DNR order (yellow form or pink MOST form) Yellow form placed in chart (order not valid for inpatient use)     Chief Complaint  Patient presents with  . Medical Management of Chronic Issues  . Health Maintenance    Influenza vaccine    HPI:  Pt is a 85 y.o. female seen today for medical management of chronic diseases.    The patient resides in SNF [facility] for safety, care assistance, ambulates with walker. Chronic edema BLE, on Furosemide 91m qd. Hypothyroidism, stable, on Levothyroxine 1589m qd. Last TSH wnl. COPD, stable, on DuoNeb bid. Constipation, stable, on Colace 20063md.    Past Medical History:  Diagnosis Date  . Abnormal liver function tests    11/11/15 AST 31, ALT 88, alk phos 96   . Acute kidney failure, unspecified ([place]CC[place]/10/2012   . Arthralgia 09/26/2014   Multiple joints: knees, shoulders, wrists, spine hips   . Arthritis   . Candidiasis of other urogenital sites 08/10/2012  . Cerebral embolism 05/23/2005  . CHF (congestive heart failure) ([place]CC[place] . Cholelithiasis 11/04/2015  . Closed fracture of sacrum and coccyx without mention of spinal cord injury 02/14/2012  . Constipation 05/03/2013  . Contusion of face, scalp, and neck except eye(s) 06/01/2012  . Contusion of wrist 06/01/2012  . COPD (chronic obstructive pulmonary disease) (HCC[place] . COPD, mild (HCCT[place]/07/2013  . Edema 04/20/2012  . H/O: CVA (cerebrovascular accident)   . Hearing loss 09/26/2014  . History of cancer of uterus   . HTN (hypertension), benign   . Hyperglycemia 11/14/2015  . Hypothyroidism   . Insomnia, unspecified 08/10/2012  . Open wound of knee, leg (except thigh), and ankle, without mention of complication 5/91/01/2481 Other and unspecified hyperlipidemia 06/01/2012  . Other disorder of coccyx 01/27/2012  . Pain in joint, ankle and foot 06/14/2012  . Peripheral vascular disease, unspecified ([place]CC[place]/14/2013  . Personal history of fall 01/27/2012  . Pneumonia, organism unspecified(486) 01/23/2005  . Rheumatic fever 09/26/1933   Age 65 10/01/14 ESR 13, RAF <10   . Seasonal allergies 05/03/2013  . Stasis dermatitis of both legs 09/26/2014  . Unspecified constipation 11/02/2012  . Unspecified hearing loss 02/08/2013  . Unspecified hereditary and idiopathic peripheral neuropathy 01/27/2012  . Urinary frequency 02/24/2014  . Ventricular fibrillation (HCCR[place]/10/2012   Past Surgical History:  Procedure Laterality Date  . ABDOMINAL HYSTERECTOMY  1990   for endometrial cancer  .  CATARACT EXTRACTION W/ INTRAOCULAR LENS  IMPLANT, BILATERAL    . [place]  . GUM SURGERY  1932  . MASTOIDECTOMY  1920   bilateral  . THYROIDECTOMY  1960  . TONSILLECTOMY  1916    Allergies  Allergen Reactions  . Amoxicillin     Unknown, patient unable  to answer questionnaire   . Aspirin Other (See Comments)    On MAR  . Avelox [Moxifloxacin]     Unknown: listed on MAR  . Erythromycin     Unknown: listed on MAR  . Monistat [Miconazole]     Unknown: listed on MAR  . Morphine And Related     Unknown: listed on MAR  . Orange Juice [Orange Oil]     Unknown: listed on MAR    Allergies as of 10/10/2019      Reactions   Amoxicillin    Unknown, patient unable to answer questionnaire    Aspirin Other (See Comments)   On MAR   Avelox [moxifloxacin]    Unknown: listed on MAR   Erythromycin    Unknown: listed on MAR   Monistat [miconazole]    Unknown: listed on MAR   Morphine And Related    Unknown: listed on MAR   Orange Juice [orange Oil]    Unknown: listed on [facility]      Medication List       Accurate as of October 10, 2019  3:32 PM. If you have any questions, ask your nurse or doctor.        STOP taking these medications   acetaminophen 325 MG tablet Commonly known as: TYLENOL Stopped by: Jared Cahn X Kalyna Paolella, NP     TAKE these medications   aluminum-magnesium hydroxide-simethicone 671-245-80 MG/5ML Susp Commonly known as: MAALOX Take 30 mLs by mouth every 6 (six) hours as needed.   aspirin 81 MG chewable tablet Chew 81 mg by mouth daily.   calcium citrate-vitamin D 315-200 MG-UNIT tablet Commonly known as: CITRACAL+D Take 1 tablet by mouth daily.   docusate sodium 100 MG capsule Commonly known as: COLACE Take 200 mg by mouth at bedtime.   fexofenadine 60 MG tablet Commonly known as: ALLEGRA Take 60 mg by mouth daily.   furosemide 20 MG tablet Commonly known as: LASIX Take 40 mg by mouth daily.   ipratropium-albuterol 0.5-2.5 (3) MG/3ML Soln Commonly known as: DUONEB Take 3 mLs by nebulization 2 (two) times daily.   levothyroxine 150 MCG tablet Commonly known as: SYNTHROID Take 150 mcg by mouth daily.   magnesium hydroxide 400 MG/5ML suspension Commonly known as: MILK OF MAGNESIA Take 30 mLs by mouth daily  as needed for mild constipation.   Melatonin 3 MG Tabs Take 3 mg by mouth at bedtime.   multivitamin with minerals Tabs tablet Take 1 tablet by mouth daily. Cerovite Senior   OcuSoft Lid Scrub Allergy Pads 1 WIPE PER EYE,CLEANSE BILATERAL LIDS TWICE DAILY FOR EXCESS EYE SECRETIONS   potassium chloride SA 20 MEQ tablet Commonly known as: KLOR-CON Take 20 mEq by mouth daily.   Refresh Tears 0.5 % Soln Generic drug: carboxymethylcellulose Place 1 drop into both eyes 2 (two) times daily.   white petrolatum Gel Commonly known as: VASELINE Apply 1 application topically daily. Apply to bilateral feet for dry scaly skin.   zinc oxide 20 % ointment Apply 1 application topically as needed for irritation (To buttocks after every incontinent episode and for redness.).      ROS was provided with assistance  of staff.  Review of Systems  Constitutional: Negative for activity change, appetite change, chills, diaphoresis, fatigue, fever and unexpected weight change.  HENT: Positive for hearing loss. Negative for congestion and voice change.   Respiratory: Negative for cough, shortness of breath and wheezing.   Cardiovascular: Positive for leg swelling. Negative for chest pain and palpitations.  Gastrointestinal: Negative for abdominal distention, abdominal pain, constipation, diarrhea, nausea and vomiting.  Genitourinary: Negative for difficulty urinating, dysuria and urgency.  Musculoskeletal: Positive for arthralgias and gait problem.  Skin: Negative for color change and pallor.  Neurological: Negative for dizziness, speech difficulty, weakness and headaches.       Memory lapses.   Psychiatric/Behavioral: Negative for agitation, behavioral problems, hallucinations and sleep disturbance. The patient is not nervous/anxious.     Immunization History  Administered Date(s) Administered  . Influenza Whole 10/19/2012, 09/15/2018  . Influenza-Unspecified 10/18/2013, 10/17/2014, 09/02/2015,  09/29/2016, 10/03/2017  . PPD Test 01/22/2013, 11/28/2015  . Pneumococcal Conjugate-13 11/18/2017  . Pneumococcal Polysaccharide-23 01/22/2013  . Td 02/21/2009  . Tdap 04/07/2015   Pertinent  Health Maintenance Due  Topic Date Due  . INFLUENZA VACCINE  07/14/2019  . PNA vac Low Risk Adult  Completed   Fall Risk  09/05/2018 09/02/2017 11/14/2015 04/17/2015 09/26/2014  Falls in the past year? Yes No No Yes No  Number falls in past yr: 2 or more - - 1 -  Injury with Fall? Yes - - Yes -  Comment - - - laceration forehead 04/07/15 -  Risk Factor Category  - - - High Fall Risk -  Risk for fall due to : - - Impaired mobility;Impaired vision - -   Functional Status Survey:    Vitals:   10/10/19 0924  BP: 120/70  Pulse: 74  Resp: 18  Temp: (!) 97.2 F (36.2 C)  SpO2: 92%  Weight: 154 lb 6.4 oz (70 kg)  Height: 5' (1.524 m)   Body mass index is 30.15 kg/m. Physical Exam Vitals signs and nursing note reviewed.  Constitutional:      General: She is not in acute distress.    Appearance: Normal appearance. She is obese. She is not ill-appearing, toxic-appearing or diaphoretic.  HENT:     Head: Normocephalic and atraumatic.     Nose: Nose normal.     Mouth/Throat:     Mouth: Mucous membranes are moist.  Eyes:     Extraocular Movements: Extraocular movements intact.     Conjunctiva/sclera: Conjunctivae normal.     Pupils: Pupils are equal, round, and reactive to light.  Neck:     Musculoskeletal: Normal range of motion and neck supple.  Cardiovascular:     Rate and Rhythm: Normal rate and regular rhythm.     Heart sounds: No murmur.  Pulmonary:     Breath sounds: No wheezing or rhonchi.  Abdominal:     General: Bowel sounds are normal. There is no distension.     Palpations: Abdomen is soft.     Tenderness: There is no abdominal tenderness. There is no right CVA tenderness, left CVA tenderness, guarding or rebound.  Musculoskeletal:     Right lower leg: Edema present.      Left lower leg: Edema present.     Comments: 1+ edema BLE  Skin:    General: Skin is warm and dry.  Neurological:     General: No focal deficit present.     Mental Status: She is alert. Mental status is at baseline.     Cranial  Nerves: No cranial nerve deficit.     Motor: No weakness.     Coordination: Coordination normal.     Gait: Gait abnormal.     Comments: Oriented to person, place.   Psychiatric:        Mood and Affect: Mood normal.        Behavior: Behavior normal.        Thought Content: Thought content normal.     Labs reviewed: Recent Labs    01/18/19 08/15/19  NA 139 137  K 4.0 4.4  CL 104  --   CO2 29  --   BUN 36* 38*  CREATININE 0.9 1.1  CALCIUM 8.8  --    Recent Labs    01/18/19 08/15/19  AST 17 25  ALT 14 17  ALKPHOS 54 64  PROT 5.9  --   ALBUMIN 3.6  --    Recent Labs    01/18/19 08/15/19  WBC 6.7 10.6  HGB 13.6 14.9  HCT 41 46  PLT 190 232   Lab Results  Component Value Date   TSH 2.72 08/15/2019   No results found for: HGBA1C No results found for: CHOL, HDL, LDLCALC, LDLDIRECT, TRIG, CHOLHDL  Significant Diagnostic Results in last 30 days:  No results found.  Assessment/Plan COPD, mild (HCC) Stable, continue DuoNeb bid.   Hypothyroidism Stable, last TSH wnl 2.72 08/15/19, continue Levothyroxine 153mg qd.   Constipation Stable, continue Colace 2075mqd.   Edema of both lower legs due to peripheral venous insufficiency Venous insufficiency etiology, continue Furosemide 4048md.      Family/ staff Communication: plan of care reviewed with the patient and charge nurse.   Labs/tests ordered:  none  Time spend 25 minutes.

## 2019-10-10 NOTE — Assessment & Plan Note (Addendum)
Stable, last TSH wnl 2.72 08/15/19, continue Levothyroxine 117mcg qd.

## 2019-10-10 NOTE — Assessment & Plan Note (Signed)
Venous insufficiency etiology, continue Furosemide 40mg  qd.

## 2019-10-10 NOTE — Assessment & Plan Note (Signed)
Stable, continue Colace 200mg qd.  

## 2019-10-10 NOTE — Assessment & Plan Note (Signed)
Stable, continue DuoNeb bid.  

## 2019-11-01 ENCOUNTER — Non-Acute Institutional Stay (SKILLED_NURSING_FACILITY): Payer: Medicare Other | Admitting: Internal Medicine

## 2019-11-01 ENCOUNTER — Encounter: Payer: Self-pay | Admitting: Internal Medicine

## 2019-11-01 DIAGNOSIS — H10403 Unspecified chronic conjunctivitis, bilateral: Secondary | ICD-10-CM

## 2019-11-01 DIAGNOSIS — I872 Venous insufficiency (chronic) (peripheral): Secondary | ICD-10-CM

## 2019-11-01 DIAGNOSIS — R2681 Unsteadiness on feet: Secondary | ICD-10-CM | POA: Diagnosis not present

## 2019-11-01 DIAGNOSIS — E039 Hypothyroidism, unspecified: Secondary | ICD-10-CM | POA: Diagnosis not present

## 2019-11-01 DIAGNOSIS — R600 Localized edema: Secondary | ICD-10-CM

## 2019-11-01 DIAGNOSIS — R609 Edema, unspecified: Secondary | ICD-10-CM

## 2019-11-01 NOTE — Progress Notes (Signed)
Location:    [facility] Home Room Number: 16 Place of Service:  SNF (31) Provider:  [name], MD  [name], MD  Patient Care Team: [name], MD as PCP - General (Internal Medicine) [place] [place], Man X, NP as Nurse Practitioner (Nurse Practitioner)  Extended Emergency Contact Information Primary Emergency Contact: [name] States of [place] Phone: [phone] Relation: Son Secondary Emergency Contact: [place] Phone: [phone] Mobile Phone: [phone] Relation: Daughter  Code Status:  DNR Goals of care: Advanced Directive information Advanced Directives 08/27/2019  Does Patient Have a Medical Advance Directive? Yes  Type of Paramedic of [place];Living will;Out of facility DNR (pink MOST or yellow form)  Does patient want to make changes to medical advance directive? No - Patient declined  Copy of [place] in Chart? Yes - validated most recent copy scanned in chart (See row information)  Would patient like information on creating a medical advance directive? -  Pre-existing out of facility DNR order (yellow form or pink MOST form) Yellow form placed in chart (order not valid for inpatient use)     Chief Complaint  Patient presents with  . Medical Management of Chronic Issues    HPI:  Pt is a 85 y.o. female seen today for medical management of chronic diseases.    Patient has h/o Bilateral LE edema , Hypertension, Hard of hearing, h/o Cellulitis, Hypothyroidism, COPD  Patient has h/o Bilateral LE edema , Hypertension, Hard of hearing, h/o Cellulitis, Hypothyroidism, COPD  Has lost almost 10 lbs on Lasix BUN and creatinine have been stable Her lower extremity edema is better.  Patient is very hard of hearing and difficult to communicate.  But did not have any new complaints.  I did notice some discharge redness from her eyes No new  nursing issues  Past Medical History:  Diagnosis Date  . Abnormal liver function tests    11/11/15 AST 31, ALT 88, alk phos 96   . Acute kidney failure, unspecified ([place]) 01/24/2012  . Arthralgia 09/26/2014   Multiple joints: knees, shoulders, wrists, spine hips   . Arthritis   . Candidiasis of other urogenital sites 08/10/2012  . Cerebral embolism 05/23/2005  . CHF (congestive heart failure) ([place])   . Cholelithiasis 11/04/2015  . Closed fracture of sacrum and coccyx without mention of spinal cord injury 02/14/2012  . Constipation 05/03/2013  . Contusion of face, scalp, and neck except eye(s) 06/01/2012  . Contusion of wrist 06/01/2012  . COPD (chronic obstructive pulmonary disease) ([place])   . COPD, mild ([place]) 01/20/2013  . Edema 04/20/2012  . H/O: CVA (cerebrovascular accident)   . Hearing loss 09/26/2014  . History of cancer of uterus   . HTN (hypertension), benign   . Hyperglycemia 11/14/2015  . Hypothyroidism   . Insomnia, unspecified 08/10/2012  . Open wound of knee, leg (except thigh), and ankle, without mention of complication 05/18/2946  . Other and unspecified hyperlipidemia 06/01/2012  . Other disorder of coccyx 01/27/2012  . Pain in joint, ankle and foot 06/14/2012  . Peripheral vascular disease, unspecified ([place]) 01/27/2012  . Personal history of fall 01/27/2012  . Pneumonia, organism unspecified(486) 01/23/2005  . Rheumatic fever 09/26/1933   Age 83 10/01/14 ESR 13, RAF <10   . Seasonal allergies 05/03/2013  . Stasis dermatitis of both legs 09/26/2014  . Unspecified constipation 11/02/2012  . Unspecified hearing loss 02/08/2013  . Unspecified hereditary and idiopathic peripheral neuropathy  01/27/2012  . Urinary frequency 02/24/2014  . Ventricular fibrillation ([place]) 01/24/2012   Past Surgical History:  Procedure Laterality Date  . ABDOMINAL HYSTERECTOMY  1990   for endometrial cancer  . CATARACT EXTRACTION W/ INTRAOCULAR LENS  IMPLANT, BILATERAL    . [place]   . GUM SURGERY  1932  . MASTOIDECTOMY  1920   bilateral  . THYROIDECTOMY  1960  . TONSILLECTOMY  1916    Allergies  Allergen Reactions  . Amoxicillin     Unknown, patient unable to answer questionnaire   . Aspirin Other (See Comments)    On MAR  . Avelox [Moxifloxacin]     Unknown: listed on MAR  . Erythromycin     Unknown: listed on MAR  . Monistat [Miconazole]     Unknown: listed on MAR  . Morphine And Related     Unknown: listed on MAR  . Orange Juice [Orange Oil]     Unknown: listed on MAR    Allergies as of 11/01/2019      Reactions   Amoxicillin    Unknown, patient unable to answer questionnaire    Aspirin Other (See Comments)   On MAR   Avelox [moxifloxacin]    Unknown: listed on MAR   Erythromycin    Unknown: listed on MAR   Monistat [miconazole]    Unknown: listed on MAR   Morphine And Related    Unknown: listed on MAR   Orange Juice [orange Oil]    Unknown: listed on [facility]      Medication List       Accurate as of November 01, 2019 10:17 AM. If you have any questions, ask your nurse or doctor.        aluminum-magnesium hydroxide-simethicone 416-606-30 MG/5ML Susp Commonly known as: MAALOX Take 30 mLs by mouth every 6 (six) hours as needed.   aspirin 81 MG chewable tablet Chew 81 mg by mouth daily.   calcium citrate-vitamin D 315-200 MG-UNIT tablet Commonly known as: CITRACAL+D Take 1 tablet by mouth daily.   docusate sodium 100 MG capsule Commonly known as: COLACE Take 200 mg by mouth at bedtime.   fexofenadine 60 MG tablet Commonly known as: ALLEGRA Take 60 mg by mouth daily.   furosemide 20 MG tablet Commonly known as: LASIX Take 40 mg by mouth daily.   ipratropium-albuterol 0.5-2.5 (3) MG/3ML Soln Commonly known as: DUONEB Take 3 mLs by nebulization 2 (two) times daily.   levothyroxine 150 MCG tablet Commonly known as: SYNTHROID Take 150 mcg by mouth daily.   magnesium hydroxide 400 MG/5ML suspension Commonly known as: MILK  OF MAGNESIA Take 30 mLs by mouth daily as needed for mild constipation.   Melatonin 3 MG Tabs Take 3 mg by mouth at bedtime.   multivitamin with minerals Tabs tablet Take 1 tablet by mouth daily. Cerovite Senior   OcuSoft Lid Scrub Allergy Pads 1 WIPE PER EYE,CLEANSE BILATERAL LIDS TWICE DAILY FOR EXCESS EYE SECRETIONS   potassium chloride SA 20 MEQ tablet Commonly known as: KLOR-CON Take 20 mEq by mouth daily.   Refresh Tears 0.5 % Soln Generic drug: carboxymethylcellulose Place 1 drop into both eyes 2 (two) times daily.   white petrolatum Gel Commonly known as: VASELINE Apply 1 application topically daily. Apply to bilateral feet for dry scaly skin.   zinc oxide 20 % ointment Apply 1 application topically as needed for irritation (To buttocks after every incontinent episode and for redness.).  Review of Systems  Constitutional: Negative.   HENT: Negative.   Eyes: Positive for discharge and redness.  Respiratory: Negative.   Cardiovascular: Positive for leg swelling.  Gastrointestinal: Negative.   Genitourinary: Negative.   Musculoskeletal: Negative.   Neurological: Positive for weakness.  Psychiatric/Behavioral: Negative.     Immunization History  Administered Date(s) Administered  . Influenza Whole 10/19/2012, 09/15/2018  . Influenza-Unspecified 10/18/2013, 10/17/2014, 09/02/2015, 09/29/2016, 10/03/2017  . PPD Test 01/22/2013, 11/28/2015  . Pneumococcal Conjugate-13 11/18/2017  . Pneumococcal Polysaccharide-23 01/22/2013  . Td 02/21/2009  . Tdap 04/07/2015   Pertinent  Health Maintenance Due  Topic Date Due  . INFLUENZA VACCINE  07/14/2019  . PNA vac Low Risk Adult  Completed   Fall Risk  09/05/2018 09/02/2017 11/14/2015 04/17/2015 09/26/2014  Falls in the past year? Yes No No Yes No  Number falls in past yr: 2 or more - - 1 -  Injury with Fall? Yes - - Yes -  Comment - - - laceration forehead 04/07/15 -  Risk Factor Category  - - - High Fall Risk -   Risk for fall due to : - - Impaired mobility;Impaired vision - -   Functional Status Survey:    Vitals:   11/01/19 1004  BP: (!) 168/74  Pulse: 96  Resp: 20  Temp: (!) 96.3 F (35.7 C)  SpO2: 92%  Weight: 151 lb 6.4 oz (68.7 kg)  Height: 5' (1.524 m)   Body mass index is 29.57 kg/m. Physical Exam Vitals signs reviewed.  Constitutional:      Appearance: Normal appearance.  HENT:     Head: Normocephalic.     Nose: Nose normal.     Mouth/Throat:     Mouth: Mucous membranes are moist.     Pharynx: Oropharynx is clear.  Eyes:     Pupils: Pupils are equal, round, and reactive to light.     Comments: Bilateral Red eye with Discharge  Neck:     Musculoskeletal: Neck supple.  Cardiovascular:     Rate and Rhythm: Normal rate and regular rhythm.     Pulses: Normal pulses.  Pulmonary:     Effort: Pulmonary effort is normal.     Breath sounds: Normal breath sounds.  Abdominal:     General: Abdomen is flat. Bowel sounds are normal.     Palpations: Abdomen is soft.  Musculoskeletal:     Comments: Mild edema bilateral  Skin:    General: Skin is warm.  Neurological:     General: No focal deficit present.     Mental Status: She is alert.     Comments: Very Hard of hearing  Psychiatric:        Mood and Affect: Mood normal.        Thought Content: Thought content normal.     Labs reviewed: Recent Labs    01/18/19 08/15/19  NA 139 137  K 4.0 4.4  CL 104  --   CO2 29  --   BUN 36* 38*  CREATININE 0.9 1.1  CALCIUM 8.8  --    Recent Labs    01/18/19 08/15/19  AST 17 25  ALT 14 17  ALKPHOS 54 64  PROT 5.9  --   ALBUMIN 3.6  --    Recent Labs    01/18/19 08/15/19  WBC 6.7 10.6  HGB 13.6 14.9  HCT 41 46  PLT 190 232   Lab Results  Component Value Date   TSH 2.72 08/15/2019   No results  found for: HGBA1C No results found for: CHOL, HDL, LDLCALC, LDLDIRECT, TRIG, CHOLHDL  Significant Diagnostic Results in last 30 days:  No results found.   Assessment/Plan  Conjunctivitis Will start on Ocuflox. Continue for 2 weeks Fall Patient did have recent Fall But did not sustain any injury Bilateral leg edema  Doing well on Lasix Would continue same dose BUN and Creat stable Hypothyroidism, unspecified type TSH was 2.72  Essential hypertension Was high today But usually has been stable No change on Lasix  Family/ staff Communication:   Labs/tests ordered:    Total time spent in this patient care encounter was  25_  minutes; greater than 50% of the visit spent counseling patient and staff, reviewing records , Labs and coordinating care for problems addressed at this encounter.

## 2019-11-02 ENCOUNTER — Non-Acute Institutional Stay (SKILLED_NURSING_FACILITY): Payer: Medicare Other | Admitting: Nurse Practitioner

## 2019-11-02 ENCOUNTER — Encounter: Payer: Self-pay | Admitting: Nurse Practitioner

## 2019-11-02 DIAGNOSIS — E890 Postprocedural hypothyroidism: Secondary | ICD-10-CM

## 2019-11-02 DIAGNOSIS — I872 Venous insufficiency (chronic) (peripheral): Secondary | ICD-10-CM | POA: Diagnosis not present

## 2019-11-02 DIAGNOSIS — R404 Transient alteration of awareness: Secondary | ICD-10-CM

## 2019-11-02 DIAGNOSIS — R609 Edema, unspecified: Secondary | ICD-10-CM

## 2019-11-02 DIAGNOSIS — T148XXA Other injury of unspecified body region, initial encounter: Secondary | ICD-10-CM | POA: Insufficient documentation

## 2019-11-02 NOTE — Assessment & Plan Note (Signed)
Persisted 1+ edema BLE, continue Furosemide 20mg  qd.

## 2019-11-02 NOTE — Assessment & Plan Note (Signed)
Continue last TSH 2.72 08/15/19, continue Levothyroxine 140mcg qd.

## 2019-11-02 NOTE — Progress Notes (Signed)
Location:   SNF [place] Room Number: 28 Place of Service:  SNF (31) Provider: [facility] Daronte Shostak NP  [name], MD  Patient Care Team: [name], MD as PCP - General (Internal Medicine) [place] [place] [place] G[place] X, NP as Nurse Practitioner (Nurse Practitioner)  Extended Emergency Contact Information Primary Emergency Contact: [name] States of [place] Phone: [phone] Relation: Son Secondary Emergency Contact: [place] Phone: [phone] Mobile Phone: [phone] Relation: Daughter  Code Status: DNR Goals of care: Advanced Directive information Advanced Directives 08/27/2019  Does Patient Have a Medical Advance Directive? Yes  Type of Paramedic of [place];Living will;Out of facility DNR (pink MOST or yellow form)  Does patient want to make changes to medical advance directive? No - Patient declined  Copy of [place] in Chart? Yes - validated most recent copy scanned in chart (See row information)  Would patient like information on creating a medical advance directive? -  Pre-existing out of facility DNR order (yellow form or pink MOST form) Yellow form placed in chart (order not valid for inpatient use)     Chief Complaint  Patient presents with   Acute Visit    unresponsive episode on commode last evening    HPI:  Pt is a 85 y.o. female seen today for an acute visit for an unresponsive episode while on commode. She was found on commode with eyes closed and would not open eyes or respond to tactile stimuli. The patient was transferred to the recliner  VS wnl, returned to her usual state of health in 30-45 minutes. HPOA: no hospitalization, no Labs except UA C/S, no diagnostic testing. Hx of edema BLE, persisted, on Furosmeide '20mg'$  qd. Hypothyroidism, on Levothyroxine 150mg qd, last TSH 2.72 9/2;/20   Past Medical History:  Diagnosis Date    Abnormal liver function tests    11/11/15 AST 31, ALT 88, alk phos 96    Acute kidney failure, unspecified ([place] 01/24/2012   Arthralgia 09/26/2014   Multiple joints: knees, shoulders, wrists, spine hips    Arthritis    Candidiasis of other urogenital sites 08/10/2012   Cerebral embolism 05/23/2005   CHF (congestive heart failure) ([place]    Cholelithiasis 11/04/2015   Closed fracture of sacrum and coccyx without mention of spinal cord injury 02/14/2012   Constipation 05/03/2013   Contusion of face, scalp, and neck except eye(s) 06/01/2012   Contusion of wrist 06/01/2012   COPD (chronic obstructive pulmonary disease) ([facility])    COPD, mild ([place] 01/20/2013   Edema 04/20/2012   H/O: CVA (cerebrovascular accident)    Hearing loss 09/26/2014   History of cancer of uterus    HTN (hypertension), benign    Hyperglycemia 11/14/2015   Hypothyroidism    Insomnia, unspecified 08/10/2012   Open wound of knee, leg (except thigh), and ankle, without mention of complication 59/05/2228  Other and unspecified hyperlipidemia 06/01/2012   Other disorder of coccyx 01/27/2012   Pain in joint, ankle and foot 06/14/2012   Peripheral vascular disease, unspecified ([place] 01/27/2012   Personal history of fall 01/27/2012   Pneumonia, organism unspecified(486) 01/23/2005   Rheumatic fever 09/26/1933   Age 26 10/01/14 ESR 13, RAF <10    Seasonal allergies 05/03/2013   Stasis dermatitis of both legs 09/26/2014   Unspecified constipation 11/02/2012   Unspecified hearing loss 02/08/2013   Unspecified hereditary and idiopathic peripheral neuropathy 01/27/2012   Urinary frequency 02/24/2014   Ventricular fibrillation (  [place]) 01/24/2012   Past Surgical History:  Procedure Laterality Date   ABDOMINAL HYSTERECTOMY  1990   for endometrial cancer   CATARACT EXTRACTION W/ INTRAOCULAR LENS  IMPLANT, BILATERAL     ECTOPIC PREGNANCY SURGERY  1952   GUM SURGERY  1932   MASTOIDECTOMY  1920   bilateral    THYROIDECTOMY  1960   TONSILLECTOMY  1916    Allergies  Allergen Reactions   Amoxicillin     Unknown, patient unable to answer questionnaire    Aspirin Other (See Comments)    On MAR   Avelox [Moxifloxacin]     Unknown: listed on MAR   Erythromycin     Unknown: listed on MAR   Monistat [Miconazole]     Unknown: listed on MAR   Morphine And Related     Unknown: listed on MAR   Orange Juice [Orange Oil]     Unknown: listed on MAR    Allergies as of 11/02/2019      Reactions   Amoxicillin    Unknown, patient unable to answer questionnaire    Aspirin Other (See Comments)   On MAR   Avelox [moxifloxacin]    Unknown: listed on MAR   Erythromycin    Unknown: listed on MAR   Monistat [miconazole]    Unknown: listed on MAR   Morphine And Related    Unknown: listed on MAR   Orange Juice [orange Oil]    Unknown: listed on [facility]      Medication List       Accurate as of November 02, 2019 11:00 AM. If you have any questions, ask your nurse or doctor.        aluminum-magnesium hydroxide-simethicone 409-811-91 MG/5ML Susp Commonly known as: MAALOX Take 30 mLs by mouth every 6 (six) hours as needed.   aspirin 81 MG chewable tablet Chew 81 mg by mouth daily.   calcium citrate-vitamin D 315-200 MG-UNIT tablet Commonly known as: CITRACAL+D Take 1 tablet by mouth daily.   docusate sodium 100 MG capsule Commonly known as: COLACE Take 200 mg by mouth at bedtime.   fexofenadine 60 MG tablet Commonly known as: ALLEGRA Take 60 mg by mouth daily.   furosemide 20 MG tablet Commonly known as: LASIX Take 40 mg by mouth daily.   ipratropium-albuterol 0.5-2.5 (3) MG/3ML Soln Commonly known as: DUONEB Take 3 mLs by nebulization 2 (two) times daily.   levothyroxine 150 MCG tablet Commonly known as: SYNTHROID Take 150 mcg by mouth daily.   magnesium hydroxide 400 MG/5ML suspension Commonly known as: MILK OF MAGNESIA Take 30 mLs by mouth daily as needed for mild  constipation.   Melatonin 3 MG Tabs Take 3 mg by mouth at bedtime.   multivitamin with minerals Tabs tablet Take 1 tablet by mouth daily. Cerovite Senior   OcuSoft Lid Scrub Allergy Pads 1 WIPE PER EYE,CLEANSE BILATERAL LIDS TWICE DAILY FOR EXCESS EYE SECRETIONS   potassium chloride SA 20 MEQ tablet Commonly known as: KLOR-CON Take 20 mEq by mouth daily.   Refresh Tears 0.5 % Soln Generic drug: carboxymethylcellulose Place 1 drop into both eyes 2 (two) times daily.   white petrolatum Gel Commonly known as: VASELINE Apply 1 application topically daily. Apply to bilateral feet for dry scaly skin.   zinc oxide 20 % ointment Apply 1 application topically as needed for irritation (To buttocks after every incontinent episode and for redness.).      ROS was provided with assistance of staff.  Review of Systems  Constitutional: Negative for activity change, appetite change, chills, diaphoresis, fatigue and fever.  HENT: Positive for hearing loss. Negative for congestion and voice change.   Respiratory: Negative for cough, shortness of breath and wheezing.   Cardiovascular: Positive for leg swelling. Negative for chest pain and palpitations.  Gastrointestinal: Negative for abdominal distention, abdominal pain, constipation, diarrhea, nausea and vomiting.  Genitourinary: Negative for difficulty urinating, dysuria and urgency.  Musculoskeletal: Positive for gait problem.  Skin: Negative for color change and pallor.  Neurological: Negative for dizziness, speech difficulty, weakness and headaches.       Memory lapses. Unresponsive episode last night.   Psychiatric/Behavioral: Negative for agitation, behavioral problems, hallucinations and sleep disturbance. The patient is not nervous/anxious.     Immunization History  Administered Date(s) Administered   Influenza Whole 10/19/2012, 09/15/2018   Influenza-Unspecified 10/18/2013, 10/17/2014, 09/02/2015, 09/29/2016, 10/03/2017   PPD  Test 01/22/2013, 11/28/2015   Pneumococcal Conjugate-13 11/18/2017   Pneumococcal Polysaccharide-23 01/22/2013   Td 02/21/2009   Tdap 04/07/2015   Pertinent  Health Maintenance Due  Topic Date Due   INFLUENZA VACCINE  07/14/2019   PNA vac Low Risk Adult  Completed   Fall Risk  09/05/2018 09/02/2017 11/14/2015 04/17/2015 09/26/2014  Falls in the past year? Yes No No Yes No  Number falls in past yr: 2 or more - - 1 -  Injury with Fall? Yes - - Yes -  Comment - - - laceration forehead 04/07/15 -  Risk Factor Category  - - - High Fall Risk -  Risk for fall due to : - - Impaired mobility;Impaired vision - -   Functional Status Survey:    Vitals:   11/02/19 1041  BP: 132/74  Pulse: 94  Resp: 16  Temp: (!) 97.4 F (36.3 C)  SpO2: 94%   There is no height or weight on file to calculate BMI. Physical Exam Vitals signs and nursing note reviewed.  Constitutional:      General: She is not in acute distress.    Appearance: Normal appearance. She is not ill-appearing, toxic-appearing or diaphoretic.  HENT:     Head: Normocephalic.     Nose: Nose normal.     Mouth/Throat:     Mouth: Mucous membranes are moist.  Eyes:     Extraocular Movements: Extraocular movements intact.     Conjunctiva/sclera: Conjunctivae normal.     Pupils: Pupils are equal, round, and reactive to light.  Neck:     Musculoskeletal: Normal range of motion.  Cardiovascular:     Rate and Rhythm: Normal rate and regular rhythm.     Heart sounds: Murmur present.  Pulmonary:     Breath sounds: Rales present.     Comments: Bibasilar rales.  Abdominal:     General: Bowel sounds are normal. There is no distension.     Palpations: Abdomen is soft.     Tenderness: There is no abdominal tenderness. There is no right CVA tenderness, left CVA tenderness, guarding or rebound.  Musculoskeletal:     Right lower leg: Edema present.     Left lower leg: Edema present.     Comments: 1+ edema BLE  Skin:    General:  Skin is warm and dry.     Comments: R+L knee abrasion, superficial skin missing.   Neurological:     General: No focal deficit present.     Mental Status: She is alert. Mental status is at baseline.     Gait: Gait abnormal.     Comments:  Oriented to person, place. Ambulated with walker today  Psychiatric:        Mood and Affect: Mood normal.        Behavior: Behavior normal.     Labs reviewed: Recent Labs    01/18/19 08/15/19  NA 139 137  K 4.0 4.4  CL 104  --   CO2 29  --   BUN 36* 38*  CREATININE 0.9 1.1  CALCIUM 8.8  --    Recent Labs    01/18/19 08/15/19  AST 17 25  ALT 14 17  ALKPHOS 54 64  PROT 5.9  --   ALBUMIN 3.6  --    Recent Labs    01/18/19 08/15/19  WBC 6.7 10.6  HGB 13.6 14.9  HCT 41 46  PLT 190 232   Lab Results  Component Value Date   TSH 2.72 08/15/2019   No results found for: HGBA1C No results found for: CHOL, HDL, LDLCALC, LDLDIRECT, TRIG, CHOLHDL  Significant Diagnostic Results in last 30 days:  No results found.  Assessment/Plan: Altered mental status  an unresponsive episode while on commode last evening. She was found on commode with eyes closed and would not open eyes or respond to tactile stimuli. The patient was transferred to the recliner  VS wnl, returned to her usual state of health in 30-45 minutes. HPOA: no hospitalization, no Labs except UA C/S, no diagnostic testing.   Edema of both lower legs due to peripheral venous insufficiency Persisted 1+ edema BLE, continue Furosemide 23m qd.   Hypothyroidism Continue last TSH 2.72 08/15/19, continue Levothyroxine 1571m qd.   Skin abrasion About a quarter sized abrasion with superficial skin missing, no s/s of infection, will apply non adhesive dressing daily until healed.     Family/ staff Communication: plan of care reviewed with the patient and charge nurse.   Labs/tests ordered: pending UA C/S  Time spend 25 minutes.

## 2019-11-02 NOTE — Assessment & Plan Note (Signed)
an unresponsive episode while on commode last evening. She was found on commode with eyes closed and would not open eyes or respond to tactile stimuli. The patient was transferred to the recliner  VS wnl, returned to her usual state of health in 30-45 minutes. HPOA: no hospitalization, no Labs except UA C/S, no diagnostic testing.

## 2019-11-02 NOTE — Assessment & Plan Note (Signed)
About a quarter sized abrasion with superficial skin missing, no s/s of infection, will apply non adhesive dressing daily until healed.

## 2019-11-30 ENCOUNTER — Non-Acute Institutional Stay (SKILLED_NURSING_FACILITY): Payer: Medicare Other | Admitting: Nurse Practitioner

## 2019-11-30 ENCOUNTER — Encounter: Payer: Self-pay | Admitting: Nurse Practitioner

## 2019-11-30 DIAGNOSIS — H01005 Unspecified blepharitis left lower eyelid: Secondary | ICD-10-CM

## 2019-11-30 DIAGNOSIS — R609 Edema, unspecified: Secondary | ICD-10-CM

## 2019-11-30 DIAGNOSIS — I872 Venous insufficiency (chronic) (peripheral): Secondary | ICD-10-CM | POA: Diagnosis not present

## 2019-11-30 DIAGNOSIS — H01002 Unspecified blepharitis right lower eyelid: Secondary | ICD-10-CM | POA: Diagnosis not present

## 2019-11-30 DIAGNOSIS — E890 Postprocedural hypothyroidism: Secondary | ICD-10-CM

## 2019-11-30 DIAGNOSIS — J449 Chronic obstructive pulmonary disease, unspecified: Secondary | ICD-10-CM | POA: Diagnosis not present

## 2019-11-30 NOTE — Progress Notes (Signed)
Location:   [place] Room Number: 28 Place of Service:  SNF (31) Provider:  Latunya Kissick NP  [name], MD  Patient Care Team: [name], MD as PCP - General (Internal Medicine) [facility] Jacque Byron X, NP as Nurse Practitioner (Nurse Practitioner)  Extended Emergency Contact Information Primary Emergency Contact: [name] [place] of [place] Phone: [phone] Relation: Son Secondary Emergency Contact: [place] Phone: [phone] Mobile Phone: [phone] Relation: Daughter  Code Status: DNR Goals of care: Advanced Directive information Advanced Directives 11/30/2019  Does Patient Have a Medical Advance Directive? Yes  Type of Advance Directive Out of facility DNR (pink MOST or yellow form)  Does patient want to make changes to medical advance directive? No - Patient declined  Copy of [place] in Chart? -  Would patient like information on creating a medical advance directive? -  Pre-existing out of facility DNR order (yellow form or pink MOST form) Yellow form placed in chart (order not valid for inpatient use)     Chief Complaint  Patient presents with  . Medical Management of Chronic Issues    HPI:  Pt is a 85 y.o. female seen today for medical management of chronic diseases.    The patient resides in [facility] for safety and care assistance, chronic edema BLE, on Furosemide 79m qd. Hx of Hypothyroidism, on Levothyroxine 1559m qd. Blepharitis, stable, on Ofloxacin qid. COPD, stable, taking DuoNeb    Past Medical History:  Diagnosis Date  . Abnormal liver function tests    11/11/15 AST 31, ALT 88, alk phos 96   . Acute kidney failure, unspecified (HCWinfall2/10/2012  . Arthralgia 09/26/2014   Multiple joints: knees, shoulders, wrists, spine hips   . Arthritis   . Candidiasis of other urogenital sites 08/10/2012  . Cerebral embolism 05/23/2005    . CHF (congestive heart failure) (HCMontz  . Cholelithiasis 11/04/2015  . Closed fracture of sacrum and coccyx without mention of spinal cord injury 02/14/2012  . Constipation 05/03/2013  . Contusion of face, scalp, and neck except eye(s) 06/01/2012  . Contusion of wrist 06/01/2012  . COPD (chronic obstructive pulmonary disease) (HCRedford  . COPD, mild (HCJersey2/07/2013  . Edema 04/20/2012  . H/O: CVA (cerebrovascular accident)   . Hearing loss 09/26/2014  . History of cancer of uterus   . HTN (hypertension), benign   . Hyperglycemia 11/14/2015  . Hypothyroidism   . Insomnia, unspecified 08/10/2012  . Open wound of knee, leg (except thigh), and ankle, without mention of complication 5/03/18/9628. Other and unspecified hyperlipidemia 06/01/2012  . Other disorder of coccyx 01/27/2012  . Pain in joint, ankle and foot 06/14/2012  . Peripheral vascular disease, unspecified (HCWhite Hall2/14/2013  . Personal history of fall 01/27/2012  . Pneumonia, organism unspecified(486) 01/23/2005  . Rheumatic fever 09/26/1933   Age 63 10/01/14 ESR 13, RAF <10   . Seasonal allergies 05/03/2013  . Stasis dermatitis of both legs 09/26/2014  . Unspecified constipation 11/02/2012  . Unspecified hearing loss 02/08/2013  . Unspecified hereditary and idiopathic peripheral neuropathy 01/27/2012  . Urinary frequency 02/24/2014  . Ventricular fibrillation (HCSummit2/10/2012   Past Surgical History:  Procedure Laterality Date  . ABDOMINAL HYSTERECTOMY  1990   for endometrial cancer  . CATARACT EXTRACTION W/ INTRAOCULAR LENS  IMPLANT, BILATERAL    [place]. GUM SURGERY  1932  . MASTOIDECTOMY  1920  bilateral  . THYROIDECTOMY  1960  . TONSILLECTOMY  1916    Allergies  Allergen Reactions  . Amoxicillin     Unknown, patient unable to answer questionnaire   . Aspirin Other (See Comments)    On MAR  . Avelox [Moxifloxacin]     Unknown: listed on MAR  . Erythromycin     Unknown: listed on MAR  . Monistat  [Miconazole]     Unknown: listed on MAR  . Morphine And Related     Unknown: listed on MAR  . Orange Juice [Orange Oil]     Unknown: listed on MAR    Allergies as of 11/30/2019      Reactions   Amoxicillin    Unknown, patient unable to answer questionnaire    Aspirin Other (See Comments)   On MAR   Avelox [moxifloxacin]    Unknown: listed on MAR   Erythromycin    Unknown: listed on MAR   Monistat [miconazole]    Unknown: listed on MAR   Morphine And Related    Unknown: listed on MAR   Orange Juice [orange Oil]    Unknown: listed on [facility]      Medication List       Accurate as of November 30, 2019 11:59 PM. If you have any questions, ask your nurse or doctor.        aluminum-magnesium hydroxide-simethicone 259-563-87 MG/5ML Susp Commonly known as: MAALOX Take 30 mLs by mouth every 6 (six) hours as needed.   aspirin 81 MG chewable tablet Chew 81 mg by mouth daily.   calcium citrate-vitamin D 315-200 MG-UNIT tablet Commonly known as: CITRACAL+D Take 1 tablet by mouth daily.   docusate sodium 100 MG capsule Commonly known as: COLACE Take 200 mg by mouth at bedtime.   fexofenadine 60 MG tablet Commonly known as: ALLEGRA Take 60 mg by mouth daily.   furosemide 20 MG tablet Commonly known as: LASIX Take 40 mg by mouth daily.   ipratropium-albuterol 0.5-2.5 (3) MG/3ML Soln Commonly known as: DUONEB Take 3 mLs by nebulization 2 (two) times daily.   levothyroxine 150 MCG tablet Commonly known as: SYNTHROID Take 150 mcg by mouth daily.   magnesium hydroxide 400 MG/5ML suspension Commonly known as: MILK OF MAGNESIA Take 30 mLs by mouth daily as needed for mild constipation.   Melatonin 3 MG Tabs Take 3 mg by mouth at bedtime.   multivitamin with minerals Tabs tablet Take 1 tablet by mouth daily. Cerovite Senior   OcuSoft Lid Scrub Allergy Pads 1 WIPE PER EYE,CLEANSE BILATERAL LIDS TWICE DAILY FOR EXCESS EYE SECRETIONS   ofloxacin 0.3 % ophthalmic  solution Commonly known as: OCUFLOX 1 drop 4 (four) times daily.   potassium chloride SA 20 MEQ tablet Commonly known as: KLOR-CON Take 20 mEq by mouth daily.   Refresh Tears 0.5 % Soln Generic drug: carboxymethylcellulose Place 1 drop into both eyes 2 (two) times daily.   white petrolatum Gel Commonly known as: VASELINE Apply 1 application topically daily. Apply to bilateral feet for dry scaly skin.   zinc oxide 20 % ointment Apply 1 application topically as needed for irritation (To buttocks after every incontinent episode and for redness.).      ROS was provided with assistance of staff.  Review of Systems  Constitutional: Negative for activity change, appetite change, chills, diaphoresis, fatigue, fever and unexpected weight change.  HENT: Positive for hearing loss. Negative for congestion and voice change.   Respiratory: Negative for cough, shortness of breath  and wheezing.   Cardiovascular: Positive for leg swelling. Negative for chest pain and palpitations.  Gastrointestinal: Negative for abdominal distention, abdominal pain, constipation, diarrhea, nausea and vomiting.  Genitourinary: Negative for difficulty urinating, dysuria, frequency and urgency.  Musculoskeletal: Positive for arthralgias and gait problem.  Skin: Negative for color change and pallor.  Neurological: Negative for dizziness, speech difficulty, weakness and headaches.       Memory lapses.   Psychiatric/Behavioral: Negative for agitation, behavioral problems, hallucinations and sleep disturbance. The patient is not nervous/anxious.     Immunization History  Administered Date(s) Administered  . Influenza Whole 10/19/2012, 09/15/2018  . Influenza, High Dose Seasonal PF 09/14/2019  . Influenza-Unspecified 10/18/2013, 10/17/2014, 09/02/2015, 09/29/2016, 10/03/2017  . PPD Test 01/22/2013, 11/28/2015  . Pneumococcal Conjugate-13 11/18/2017  . Pneumococcal Polysaccharide-23 01/22/2013  . Td 02/21/2009  .  Tdap 04/07/2015   Pertinent  Health Maintenance Due  Topic Date Due  . INFLUENZA VACCINE  Completed  . PNA vac Low Risk Adult  Completed   Fall Risk  09/05/2018 09/02/2017 11/14/2015 04/17/2015 09/26/2014  Falls in the past year? Yes No No Yes No  Number falls in past yr: 2 or more - - 1 -  Injury with Fall? Yes - - Yes -  Comment - - - laceration forehead 04/07/15 -  Risk Factor Category  - - - High Fall Risk -  Risk for fall due to : - - Impaired mobility;Impaired vision - -   Functional Status Survey:    Vitals:   11/30/19 1336  BP: 130/68  Pulse: 79  Resp: 20  Temp: (!) 96.8 F (36 C)  SpO2: 95%  Weight: 151 lb 12.8 oz (68.9 kg)  Height: 5' (1.524 m)   Body mass index is 29.65 kg/m. Physical Exam Vitals and nursing note reviewed.  Constitutional:      General: She is not in acute distress.    Appearance: Normal appearance. She is obese. She is not ill-appearing, toxic-appearing or diaphoretic.  HENT:     Head: Normocephalic and atraumatic.     Nose: Nose normal.     Mouth/Throat:     Mouth: Mucous membranes are moist.  Eyes:     Extraocular Movements: Extraocular movements intact.     Conjunctiva/sclera: Conjunctivae normal.     Pupils: Pupils are equal, round, and reactive to light.  Cardiovascular:     Rate and Rhythm: Normal rate and regular rhythm.     Heart sounds: Murmur present.  Pulmonary:     Breath sounds: Rales present. No wheezing or rhonchi.     Comments: Bibasilar rales.  Abdominal:     General: Bowel sounds are normal. There is no distension.     Palpations: Abdomen is soft.     Tenderness: There is no abdominal tenderness. There is no right CVA tenderness, left CVA tenderness, guarding or rebound.  Musculoskeletal:     Cervical back: Normal range of motion and neck supple.     Right lower leg: Edema present.     Left lower leg: Edema present.     Comments: 1+ edema BLE  Skin:    General: Skin is warm and dry.     Comments: Chronic venous  insufficiency skin changes.   Neurological:     General: No focal deficit present.     Mental Status: She is alert. Mental status is at baseline.     Motor: No weakness.     Coordination: Coordination normal.     Gait: Gait abnormal.  Comments: Oriented to person, place.   Psychiatric:        Mood and Affect: Mood normal.        Behavior: Behavior normal.        Thought Content: Thought content normal.     Labs reviewed: Recent Labs    01/18/19 0000 08/15/19 0000  NA 139 137  K 4.0 4.4  CL 104  --   CO2 29  --   BUN 36* 38*  CREATININE 0.9 1.1  CALCIUM 8.8  --    Recent Labs    01/18/19 0000 08/15/19 0000  AST 17 25  ALT 14 17  ALKPHOS 54 64  PROT 5.9  --   ALBUMIN 3.6  --    Recent Labs    01/18/19 0000 08/15/19 0000  WBC 6.7 10.6  HGB 13.6 14.9  HCT 41 46  PLT 190 232   Lab Results  Component Value Date   TSH 2.72 08/15/2019   No results found for: HGBA1C No results found for: CHOL, HDL, LDLCALC, LDLDIRECT, TRIG, CHOLHDL  Significant Diagnostic Results in last 30 days:  No results found.  Assessment/Plan Edema of both lower legs due to peripheral venous insufficiency Chronic, 1+ edema BLE, continue Furosemide 51m qd.   COPD, mild (HOlive [place] Stable, continue DuoNeb.   Hypothyroidism TSH 2.72 08/15/19, continue Levothyroxine 1528m qd.   Blepharitis of both eyes Stable, continue Ofloxacin.      Family/ staff Communication: plan of care reviewed with the patient and charge nurse.   Labs/tests ordered:  none  Time spend 25 minutes.

## 2019-12-03 ENCOUNTER — Non-Acute Institutional Stay (SKILLED_NURSING_FACILITY): Payer: Medicare Other | Admitting: Nurse Practitioner

## 2019-12-03 ENCOUNTER — Encounter: Payer: Self-pay | Admitting: Nurse Practitioner

## 2019-12-03 DIAGNOSIS — R2681 Unsteadiness on feet: Secondary | ICD-10-CM | POA: Diagnosis not present

## 2019-12-03 DIAGNOSIS — W19XXXA Unspecified fall, initial encounter: Secondary | ICD-10-CM

## 2019-12-03 DIAGNOSIS — R600 Localized edema: Secondary | ICD-10-CM

## 2019-12-03 DIAGNOSIS — G5793 Unspecified mononeuropathy of bilateral lower limbs: Secondary | ICD-10-CM

## 2019-12-03 DIAGNOSIS — I872 Venous insufficiency (chronic) (peripheral): Secondary | ICD-10-CM

## 2019-12-03 DIAGNOSIS — M2550 Pain in unspecified joint: Secondary | ICD-10-CM

## 2019-12-03 DIAGNOSIS — R410 Disorientation, unspecified: Secondary | ICD-10-CM | POA: Diagnosis not present

## 2019-12-03 DIAGNOSIS — R609 Edema, unspecified: Secondary | ICD-10-CM

## 2019-12-03 NOTE — Assessment & Plan Note (Signed)
Stable, continue Ofloxacin.

## 2019-12-03 NOTE — Assessment & Plan Note (Signed)
Chronic, 1+ edema BLE, continue Furosemide 40mg  qd.

## 2019-12-03 NOTE — Progress Notes (Signed)
Location:     [facility] Room Number: 28 Place of Service:  SNF (31) Provider:  Dody Smartt NP  [name], MD  Patient Care Team: [name], MD as PCP - General (Internal Medicine) [facility] Eldra Word X, NP as Nurse Practitioner (Nurse Practitioner)  Extended Emergency Contact Information Primary Emergency Contact: [name] [place] of [place] Phone: [phone] Relation: Son Secondary Emergency Contact: [place] Phone: [phone] Mobile Phone: [phone] Relation: Daughter  Code Status:  DNR Goals of care: Advanced Directive information Advanced Directives 11/30/2019  Does Patient Have a Medical Advance Directive? Yes  Type of Advance Directive Out of facility DNR (pink MOST or yellow form)  Does patient want to make changes to medical advance directive? No - Patient declined  Copy of [place] in Chart? -  Would patient like information on creating a medical advance directive? -  Pre-existing out of facility DNR order (yellow form or pink MOST form) Yellow form placed in chart (order not valid for inpatient use)     Chief Complaint  Patient presents with  . Acute Visit    Fall    HPI:  Pt is a 85 y.o. female seen today for an acute visit for fall reported 12/01/19 when the patient was found sitting on floor with legs at door way go into bathroom, no apparent injury. HPI was provided with assistance of staff. The patient resides in SNF [facility] for safety, care assistance, ambulates with walker with SBA,  dose not always remembering call for assistance. She denied headache, dizziness, change of vision, chest pain/pressure, or palpitation associated with the event. Hx of chronic edema BLE, on Furosemide 27m every day. Chronic pain/aches in hips, knees are positional. Peripheral neuropathy is chronic, pain with slight touch.    Past Medical  History:  Diagnosis Date  . Abnormal liver function tests    11/11/15 AST 31, ALT 88, alk phos 96   . Acute kidney failure, unspecified (HSchofield 01/24/2012  . Arthralgia 09/26/2014   Multiple joints: knees, shoulders, wrists, spine hips   . Arthritis   . Candidiasis of other urogenital sites 08/10/2012  . Cerebral embolism 05/23/2005  . CHF (congestive heart failure) (HD[place]   . Cholelithiasis 11/04/2015  . Closed fracture of sacrum and coccyx without mention of spinal cord injury 02/14/2012  . Constipation 05/03/2013  . Contusion of face, scalp, and neck except eye(s) 06/01/2012  . Contusion of wrist 06/01/2012  . COPD (chronic obstructive pulmonary disease) (HBig Piney   . COPD, mild (HGarden Pra[place] 01/20/2013  . Edema 04/20/2012  . H/O: CVA (cerebrovascular accident)   . Hearing loss 09/26/2014  . History of cancer of uterus   . HTN (hypertension), benign   . Hyperglycemia 11/14/2015  . Hypothyroidism   . Insomnia, unspecified 08/10/2012  . Open wound of knee, leg (except thigh), and ankle, without mention of complication 55/05/3148 . Other and unspecified hyperlipidemia 06/01/2012  . Other disorder of coccyx 01/27/2012  . Pain in joint, ankle and foot 06/14/2012  . Peripheral vascular disease, unspecified (HFalmouth 01/27/2012  . Personal history of fall 01/27/2012  . Pneumonia, organism unspecified(486) 01/23/2005  . Rheumatic fever 09/26/1933   Age 16 10/01/14 ESR 13, RAF <10   . Seasonal allergies 05/03/2013  . Stasis dermatitis of both legs 09/26/2014  . Unspecified constipation 11/02/2012  . Unspecified hearing loss 02/08/2013  . Unspecified hereditary and idiopathic peripheral neuropathy 01/27/2012  .  Urinary frequency 02/24/2014  . Ventricular fibrillation ([place]) 01/24/2012   Past Surgical History:  Procedure Laterality Date  . ABDOMINAL HYSTERECTOMY  1990   for endometrial cancer  . CATARACT EXTRACTION W/ INTRAOCULAR LENS  IMPLANT, BILATERAL    . [place]  . GUM SURGERY  1932  .  MASTOIDECTOMY  1920   bilateral  . THYROIDECTOMY  1960  . TONSILLECTOMY  1916    Allergies  Allergen Reactions  . Amoxicillin     Unknown, patient unable to answer questionnaire   . Aspirin Other (See Comments)    On MAR  . Avelox [Moxifloxacin]     Unknown: listed on MAR  . Erythromycin     Unknown: listed on MAR  . Monistat [Miconazole]     Unknown: listed on MAR  . Morphine And Related     Unknown: listed on MAR  . Orange Juice [Orange Oil]     Unknown: listed on MAR    Allergies as of 12/03/2019      Reactions   Amoxicillin    Unknown, patient unable to answer questionnaire    Aspirin Other (See Comments)   On MAR   Avelox [moxifloxacin]    Unknown: listed on MAR   Erythromycin    Unknown: listed on MAR   Monistat [miconazole]    Unknown: listed on MAR   Morphine And Related    Unknown: listed on MAR   Orange Juice [orange Oil]    Unknown: listed on [facility]      Medication List       Accurate as of December 03, 2019 11:59 PM. If you have any questions, ask your nurse or doctor.        aluminum-magnesium hydroxide-simethicone 680-321-22 MG/5ML Susp Commonly known as: MAALOX Take 30 mLs by mouth every 6 (six) hours as needed.   aspirin 81 MG chewable tablet Chew 81 mg by mouth daily.   calcium citrate-vitamin D 315-200 MG-UNIT tablet Commonly known as: CITRACAL+D Take 1 tablet by mouth daily.   docusate sodium 100 MG capsule Commonly known as: COLACE Take 200 mg by mouth at bedtime.   fexofenadine 60 MG tablet Commonly known as: ALLEGRA Take 60 mg by mouth daily.   furosemide 20 MG tablet Commonly known as: LASIX Take 40 mg by mouth daily.   ipratropium-albuterol 0.5-2.5 (3) MG/3ML Soln Commonly known as: DUONEB Take 3 mLs by nebulization 2 (two) times daily.   levothyroxine 150 MCG tablet Commonly known as: SYNTHROID Take 150 mcg by mouth daily.   magnesium hydroxide 400 MG/5ML suspension Commonly known as: MILK OF MAGNESIA Take 30 mLs  by mouth daily as needed for mild constipation.   Melatonin 3 MG Tabs Take 3 mg by mouth at bedtime.   multivitamin with minerals Tabs tablet Take 1 tablet by mouth daily. Cerovite Senior   OcuSoft Lid Scrub Allergy Pads 1 WIPE PER EYE,CLEANSE BILATERAL LIDS TWICE DAILY FOR EXCESS EYE SECRETIONS   ofloxacin 0.3 % ophthalmic solution Commonly known as: OCUFLOX 1 drop 4 (four) times daily.   potassium chloride SA 20 MEQ tablet Commonly known as: KLOR-CON Take 20 mEq by mouth daily.   Refresh Tears 0.5 % Soln Generic drug: carboxymethylcellulose Place 1 drop into both eyes 2 (two) times daily.   white petrolatum Gel Commonly known as: VASELINE Apply 1 application topically daily. Apply to bilateral feet for dry scaly skin.   zinc oxide 20 % ointment Apply 1 application topically as needed for irritation (To  buttocks after every incontinent episode and for redness.).      ROS was provided with assistance of staff.  Review of Systems  Constitutional: Negative for activity change, appetite change, chills, diaphoresis, fatigue and fever.  HENT: Positive for hearing loss. Negative for congestion and voice change.   Respiratory: Negative for cough, shortness of breath and wheezing.   Cardiovascular: Positive for leg swelling. Negative for chest pain and palpitations.  Gastrointestinal: Negative for abdominal distention, abdominal pain, constipation, diarrhea, nausea and vomiting.  Genitourinary: Negative for difficulty urinating, dysuria and urgency.  Musculoskeletal: Positive for arthralgias and gait problem.       Positional aches/pains in hips/knees  Neurological: Positive for weakness. Negative for dizziness, speech difficulty and headaches.       Memory lapses.   Psychiatric/Behavioral: Negative for agitation, behavioral problems, hallucinations and sleep disturbance. The patient is not nervous/anxious.     Immunization History  Administered Date(s) Administered  .  Influenza Whole 10/19/2012, 09/15/2018  . Influenza, High Dose Seasonal PF 09/14/2019  . Influenza-Unspecified 10/18/2013, 10/17/2014, 09/02/2015, 09/29/2016, 10/03/2017  . PPD Test 01/22/2013, 11/28/2015  . Pneumococcal Conjugate-13 11/18/2017  . Pneumococcal Polysaccharide-23 01/22/2013  . Td 02/21/2009  . Tdap 04/07/2015   Pertinent  Health Maintenance Due  Topic Date Due  . INFLUENZA VACCINE  Completed  . PNA vac Low Risk Adult  Completed   Fall Risk  09/05/2018 09/02/2017 11/14/2015 04/17/2015 09/26/2014  Falls in the past year? Yes No No Yes No  Number falls in past yr: 2 or more - - 1 -  Injury with Fall? Yes - - Yes -  Comment - - - laceration forehead 04/07/15 -  Risk Factor Category  - - - High Fall Risk -  Risk for fall due to : - - Impaired mobility;Impaired vision - -   Functional Status Survey:    Vitals:   12/03/19 1441  BP: 130/70  Pulse: 88  Resp: 20  Temp: (!) 96.3 F (35.7 C)  SpO2: 98%  Weight: 151 lb 12.8 oz (68.9 kg)  Height: 5' (1.524 m)   Body mass index is 29.65 kg/m. Physical Exam Vitals and nursing note reviewed.  Constitutional:      General: She is not in acute distress.    Appearance: Normal appearance. She is not ill-appearing, toxic-appearing or diaphoretic.  HENT:     Head: Normocephalic and atraumatic.     Nose: Nose normal.     Mouth/Throat:     Mouth: Mucous membranes are moist.  Eyes:     Extraocular Movements: Extraocular movements intact.     Conjunctiva/sclera: Conjunctivae normal.     Pupils: Pupils are equal, round, and reactive to light.  Cardiovascular:     Rate and Rhythm: Normal rate and regular rhythm.     Heart sounds: Murmur present.  Pulmonary:     Breath sounds: Rales present. No wheezing or rhonchi.     Comments: Chronic bibasilar rales.  Abdominal:     General: Bowel sounds are normal. There is no distension.     Palpations: Abdomen is soft.     Tenderness: There is no abdominal tenderness. There is no right  CVA tenderness, left CVA tenderness, guarding or rebound.  Musculoskeletal:     Cervical back: Normal range of motion and neck supple.     Right lower leg: Edema present.     Left lower leg: Edema present.     Comments: 1+ edema BLE  Skin:    General: Skin is warm  and dry.     Comments: Mild chronic venous insufficiency skin changes BLE  Neurological:     General: No focal deficit present.     Mental Status: She is alert. Mental status is at baseline.     Motor: Weakness present.     Coordination: Coordination normal.     Gait: Gait abnormal.     Comments: Oriented to person, place. Lower body weakness, sensitive, painful in BLE with light touch.   Psychiatric:        Mood and Affect: Mood normal.        Behavior: Behavior normal.        Thought Content: Thought content normal.     Labs reviewed: Recent Labs    01/18/19 0000 08/15/19 0000  NA 139 137  K 4.0 4.4  CL 104  --   CO2 29  --   BUN 36* 38*  CREATININE 0.9 1.1  CALCIUM 8.8  --    Recent Labs    01/18/19 0000 08/15/19 0000  AST 17 25  ALT 14 17  ALKPHOS 54 64  PROT 5.9  --   ALBUMIN 3.6  --    Recent Labs    01/18/19 0000 08/15/19 0000  WBC 6.7 10.6  HGB 13.6 14.9  HCT 41 46  PLT 190 232   Lab Results  Component Value Date   TSH 2.72 08/15/2019   No results found for: HGBA1C No results found for: CHOL, HDL, LDLCALC, LDLDIRECT, TRIG, CHOLHDL  Significant Diagnostic Results in last 30 days:  No results found.  Assessment/Plan Fall 12/01/19 when the patient was found sitting on floor with legs at door way go into bathroom, no apparent injury. The patient's lack of safety awareness-not calling for assistance, she is able to walk with walker with SBA are contributory, close supervision for safety.   Gait instability Continue to ambulate with walker with SBA  Confusion Memory lapses at times. Continue SNF [facility] for safety, care assistance.   Edema of both lower legs due to peripheral venous  insufficiency 1+ edema BLE is chronic, continue Furosemide.   Arthralgia Chronic, positional, in hips, knees.   Neuropathic pain of both legs Lower body weakness, pain with light touch, prn Tylenol is available to her.    Family/ staff Communication: plan of care reviewed with the patient and charge nurse.   Labs/tests ordered:  none  Time spend 25 minutes.

## 2019-12-03 NOTE — Assessment & Plan Note (Signed)
TSH 2.72 08/15/19, continue Levothyroxine 175mcg qd.

## 2019-12-03 NOTE — Assessment & Plan Note (Signed)
Stable, continue DuoNeb.  

## 2019-12-04 ENCOUNTER — Encounter: Payer: Self-pay | Admitting: Nurse Practitioner

## 2019-12-04 NOTE — Assessment & Plan Note (Signed)
Memory lapses at times. Continue SNF [facility] for safety, care assistance.

## 2019-12-04 NOTE — Assessment & Plan Note (Signed)
12/01/19 when the patient was found sitting on floor with legs at door way go into bathroom, no apparent injury. The patient's lack of safety awareness-not calling for assistance, she is able to walk with walker with SBA are contributory, close supervision for safety.

## 2019-12-04 NOTE — Assessment & Plan Note (Signed)
Chronic, positional, in hips, knees.

## 2019-12-04 NOTE — Assessment & Plan Note (Signed)
Continue to ambulate with walker with SBA 

## 2019-12-04 NOTE — Assessment & Plan Note (Signed)
1+ edema BLE is chronic, continue Furosemide.

## 2019-12-04 NOTE — Assessment & Plan Note (Signed)
Lower body weakness, pain with light touch, prn Tylenol is available to her.

## 2019-12-17 DIAGNOSIS — Z03818 Encounter for observation for suspected exposure to other biological agents ruled out: Secondary | ICD-10-CM | POA: Diagnosis not present

## 2019-12-21 ENCOUNTER — Non-Acute Institutional Stay (SKILLED_NURSING_FACILITY): Payer: Medicare PPO | Admitting: Nurse Practitioner

## 2019-12-21 ENCOUNTER — Encounter: Payer: Self-pay | Admitting: Nurse Practitioner

## 2019-12-21 DIAGNOSIS — I10 Essential (primary) hypertension: Secondary | ICD-10-CM

## 2019-12-21 DIAGNOSIS — I872 Venous insufficiency (chronic) (peripheral): Secondary | ICD-10-CM

## 2019-12-21 DIAGNOSIS — R404 Transient alteration of awareness: Secondary | ICD-10-CM

## 2019-12-21 DIAGNOSIS — Z7189 Other specified counseling: Secondary | ICD-10-CM

## 2019-12-21 DIAGNOSIS — R600 Localized edema: Secondary | ICD-10-CM

## 2019-12-21 DIAGNOSIS — R609 Edema, unspecified: Secondary | ICD-10-CM | POA: Diagnosis not present

## 2019-12-21 NOTE — Progress Notes (Signed)
Location:   SNF [place] Room Number: 28 Place of Service:  SNF (31) Provider: [facility] Iram Lundberg NP  [name], MD  Patient Care Team: [name], MD as PCP - General (Internal Medicine) [place] [place] Samhita Kretsch X, NP as Nurse Practitioner (Nurse Practitioner)  Extended Emergency Contact Information Primary Emergency Contact: [name] [place] of [place] Phone: [phone] Relation: Son Secondary Emergency Contact: [place] Phone: [phone] Mobile Phone: [phone] Relation: Daughter  Code Status: DNR Goals of care: Advanced Directive information Advanced Directives 11/30/2019  Does Patient Have a Medical Advance Directive? Yes  Type of Advance Directive Out of facility DNR (pink MOST or yellow form)  Does patient want to make changes to medical advance directive? No - Patient declined  Copy of [place] in Chart? -  Would patient like information on creating a medical advance directive? -  Pre-existing out of facility DNR order (yellow form or pink MOST form) Yellow form placed in chart (order not valid for inpatient use)     Chief Complaint  Patient presents with  . Acute Visit    altered mental status, RUE weakness, low Bp measurement.     HPI:  Pt is a 86 y.o. female seen today for an acute visit for not waking up as usual, grimaces when touched, but not responding simple directions. Hx of HTN, Leg edema bilaterally, on Furosemide 41m qd.    Past Medical History:  Diagnosis Date  . Abnormal liver function tests    11/11/15 AST 31, ALT 88, alk phos 96   . Acute kidney failure, unspecified ([facility] 01/24/2012  . Arthralgia 09/26/2014   Multiple joints: knees, shoulders, wrists, spine hips   . Arthritis   . Candidiasis of other urogenital sites 08/10/2012  . Cerebral embolism 05/23/2005  . CHF (congestive heart failure) ([facility]   . Cholelithiasis 11/04/2015  . Closed  fracture of sacrum and coccyx without mention of spinal cord injury 02/14/2012  . Constipation 05/03/2013  . Contusion of face, scalp, and neck except eye(s) 06/01/2012  . Contusion of wrist 06/01/2012  . COPD (chronic obstructive pulmonary disease) ([facility]   . COPD, mild ([facility] 01/20/2013  . Edema 04/20/2012  . H/O: CVA (cerebrovascular accident)   . Hearing loss 09/26/2014  . History of cancer of uterus   . HTN (hypertension), benign   . Hyperglycemia 11/14/2015  . Hypothyroidism   . Insomnia, unspecified 08/10/2012  . Open wound of knee, leg (except thigh), and ankle, without mention of complication 52/04/36 . Other and unspecified hyperlipidemia 06/01/2012  . Other disorder of coccyx 01/27/2012  . Pain in joint, ankle and foot 06/14/2012  . Peripheral vascular disease, unspecified ([facility] 01/27/2012  . Personal history of fall 01/27/2012  . Pneumonia, organism unspecified(486) 01/23/2005  . Rheumatic fever 09/26/1933   Age 23 10/01/14 ESR 13, RAF <10   . Seasonal allergies 05/03/2013  . Stasis dermatitis of both legs 09/26/2014  . Unspecified constipation 11/02/2012  . Unspecified hearing loss 02/08/2013  . Unspecified hereditary and idiopathic peripheral neuropathy 01/27/2012  . Urinary frequency 02/24/2014  . Ventricular fibrillation ([facility] 01/24/2012   Past Surgical History:  Procedure Laterality Date  . ABDOMINAL HYSTERECTOMY  1990   for endometrial cancer  . CATARACT EXTRACTION W/ INTRAOCULAR LENS  IMPLANT, BILATERAL    . [place] . GUM SURGERY  1932  . MASTOIDECTOMY  1920   bilateral  . THYROIDECTOMY  1960  .  TONSILLECTOMY  1916    Allergies  Allergen Reactions  . Amoxicillin     Unknown, patient unable to answer questionnaire   . Aspirin Other (See Comments)    On MAR  . Avelox [Moxifloxacin]     Unknown: listed on MAR  . Erythromycin     Unknown: listed on MAR  . Monistat [Miconazole]     Unknown: listed on MAR  . Morphine And Related     Unknown:  listed on MAR  . Orange Juice [Orange Oil]     Unknown: listed on MAR    Allergies as of 12/21/2019      Reactions   Amoxicillin    Unknown, patient unable to answer questionnaire    Aspirin Other (See Comments)   On MAR   Avelox [moxifloxacin]    Unknown: listed on MAR   Erythromycin    Unknown: listed on MAR   Monistat [miconazole]    Unknown: listed on MAR   Morphine And Related    Unknown: listed on MAR   Orange Juice [orange Oil]    Unknown: listed on [facility]      Medication List       Accurate as of December 21, 2019 12:44 PM. If you have any questions, ask your nurse or doctor.        aluminum-magnesium hydroxide-simethicone 854-627-03 MG/5ML Susp Commonly known as: MAALOX Take 30 mLs by mouth every 6 (six) hours as needed.   aspirin 81 MG chewable tablet Chew 81 mg by mouth daily.   calcium citrate-vitamin D 315-200 MG-UNIT tablet Commonly known as: CITRACAL+D Take 1 tablet by mouth daily.   docusate sodium 100 MG capsule Commonly known as: COLACE Take 200 mg by mouth at bedtime.   fexofenadine 60 MG tablet Commonly known as: ALLEGRA Take 60 mg by mouth daily.   furosemide 20 MG tablet Commonly known as: LASIX Take 40 mg by mouth daily.   ipratropium-albuterol 0.5-2.5 (3) MG/3ML Soln Commonly known as: DUONEB Take 3 mLs by nebulization 2 (two) times daily.   levothyroxine 150 MCG tablet Commonly known as: SYNTHROID Take 150 mcg by mouth daily.   magnesium hydroxide 400 MG/5ML suspension Commonly known as: MILK OF MAGNESIA Take 30 mLs by mouth daily as needed for mild constipation.   Melatonin 3 MG Tabs Take 3 mg by mouth at bedtime.   multivitamin with minerals Tabs tablet Take 1 tablet by mouth daily. Cerovite Senior   OcuSoft Lid Scrub Allergy Pads 1 WIPE PER EYE,CLEANSE BILATERAL LIDS TWICE DAILY FOR EXCESS EYE SECRETIONS   ofloxacin 0.3 % ophthalmic solution Commonly known as: OCUFLOX 1 drop 4 (four) times daily.   potassium chloride  SA 20 MEQ tablet Commonly known as: KLOR-CON Take 20 mEq by mouth daily.   Refresh Tears 0.5 % Soln Generic drug: carboxymethylcellulose Place 1 drop into both eyes 2 (two) times daily.   white petrolatum Gel Commonly known as: VASELINE Apply 1 application topically daily. Apply to bilateral feet for dry scaly skin.   zinc oxide 20 % ointment Apply 1 application topically as needed for irritation (To buttocks after every incontinent episode and for redness.).      ROS was provided with assistance of staff.  Review of Systems  Constitutional: Positive for activity change, appetite change and fatigue. Negative for chills and diaphoresis.       Only grimaces when touched.   HENT: Positive for hearing loss. Negative for congestion and voice change.   Eyes: Negative for visual disturbance.  Respiratory: Negative for cough, shortness of breath and wheezing.   Gastrointestinal: Negative for abdominal distention, abdominal pain, constipation, diarrhea, nausea and vomiting.  Genitourinary: Negative for difficulty urinating, dysuria and urgency.  Musculoskeletal: Positive for gait problem.  Skin: Negative for color change and pallor.  Neurological: Positive for facial asymmetry and weakness. Negative for dizziness and headaches.  Psychiatric/Behavioral:       Only grimaces     Immunization History  Administered Date(s) Administered  . Influenza Whole 10/19/2012, 09/15/2018  . Influenza, High Dose Seasonal PF 09/14/2019  . Influenza-Unspecified 10/18/2013, 10/17/2014, 09/02/2015, 09/29/2016, 10/03/2017  . PPD Test 01/22/2013, 11/28/2015  . Pneumococcal Conjugate-13 11/18/2017  . Pneumococcal Polysaccharide-23 01/22/2013  . Td 02/21/2009  . Tdap 04/07/2015   Pertinent  Health Maintenance Due  Topic Date Due  . INFLUENZA VACCINE  Completed  . PNA vac Low Risk Adult  Completed   Fall Risk  09/05/2018 09/02/2017 11/14/2015 04/17/2015 09/26/2014  Falls in the past year? Yes No No Yes No   Number falls in past yr: 2 or more - - 1 -  Injury with Fall? Yes - - Yes -  Comment - - - laceration forehead 04/07/15 -  Risk Factor Category  - - - High Fall Risk -  Risk for fall due to : - - Impaired mobility;Impaired vision - -   Functional Status Survey:    Vitals:   12/21/19 1221  BP: (!) 118/52  Pulse: 80  Temp: (!) 97.4 F (36.3 C)  SpO2: 95%   There is no height or weight on file to calculate BMI. Physical Exam Vitals and nursing note reviewed.  Constitutional:      General: She is not in acute distress.    Appearance: She is not ill-appearing, toxic-appearing or diaphoretic.     Comments: Only grimaces when touched.   HENT:     Head: Normocephalic and atraumatic.     Nose: Nose normal.     Mouth/Throat:     Mouth: Mucous membranes are moist.  Eyes:     Extraocular Movements: Extraocular movements intact.     Conjunctiva/sclera: Conjunctivae normal.     Pupils: Pupils are equal, round, and reactive to light.  Cardiovascular:     Rate and Rhythm: Normal rate and regular rhythm.     Heart sounds: No murmur.  Pulmonary:     Breath sounds: Rales present. No wheezing or rhonchi.     Comments: Bibasilar rales.  Abdominal:     General: Bowel sounds are normal. There is no distension.     Palpations: Abdomen is soft.     Tenderness: There is no abdominal tenderness. There is no right CVA tenderness, left CVA tenderness, guarding or rebound.  Musculoskeletal:     Cervical back: Normal range of motion and neck supple.     Right lower leg: Edema present.     Left lower leg: Edema present.     Comments: Trace edema BLE  Skin:    General: Skin is warm and dry.     Comments: Chronic venous insufficiency skin changes BLE  Neurological:     Motor: Weakness present.     Comments: Slightly right face weakness. RUE flaccid.   Psychiatric:     Comments: Grimaces only when touched.      Labs reviewed: Recent Labs    01/18/19 0000 08/15/19 0000  NA 139 137  K 4.0  4.4  CL 104  --   CO2 29  --   BUN 36*  38*  CREATININE 0.9 1.1  CALCIUM 8.8  --    Recent Labs    01/18/19 0000 08/15/19 0000  AST 17 25  ALT 14 17  ALKPHOS 54 64  PROT 5.9  --   ALBUMIN 3.6  --    Recent Labs    01/18/19 0000 08/15/19 0000  WBC 6.7 10.6  HGB 13.6 14.9  HCT 41 46  PLT 190 232   Lab Results  Component Value Date   TSH 2.72 08/15/2019   No results found for: HGBA1C No results found for: CHOL, HDL, LDLCALC, LDLDIRECT, TRIG, CHOLHDL  Significant Diagnostic Results in last 30 days:  No results found.  Assessment/Plan: Altered mental status Grimaces, but not opening eyes or following directions, RUE flaccid, slightly right facial weakness, TIA vs stroke, HPOA [place] granddaughter: comfort measures, no hospitalization, monitor the patient. Will obtain CBC/diff, CMP/eGFR 12/24/19, VS qshift x 72hrs. Continue ASA 34m qd.   HTN (hypertension) Low Bp measurement, hold Furosemide x3 days, observe VS, f/u BMP  Advanced care planning/counseling discussion 12/21/19 HPOA Jennifer: comfort measures, no hospitalization.   Edema of both lower legs due to peripheral venous insufficiency Trace.     Family/ staff Communication: plan of care reviewed with the patient and charge nurse.   Labs/tests ordered:  CBC/diff, CMP/eGFR 12/24/19  Time spend 25 minutes.

## 2019-12-21 NOTE — Assessment & Plan Note (Signed)
Trace

## 2019-12-21 NOTE — Assessment & Plan Note (Signed)
12/21/19 HPOA Jennifer: comfort measures, no hospitalization.

## 2019-12-21 NOTE — Assessment & Plan Note (Signed)
Low Bp measurement, hold Furosemide x3 days, observe VS, f/u BMP

## 2019-12-21 NOTE — Assessment & Plan Note (Addendum)
Grimaces, but not opening eyes or following directions, RUE flaccid, slightly right facial weakness, TIA vs stroke, HPOA [place] granddaughter: comfort measures, no hospitalization, monitor the patient. Will obtain CBC/diff, CMP/eGFR 12/24/19, VS qshift x 72hrs. Continue ASA 64m qd.

## 2019-12-24 DIAGNOSIS — I10 Essential (primary) hypertension: Secondary | ICD-10-CM | POA: Diagnosis not present

## 2019-12-24 DIAGNOSIS — Z20828 Contact with and (suspected) exposure to other viral communicable diseases: Secondary | ICD-10-CM | POA: Diagnosis not present

## 2019-12-24 LAB — BASIC METABOLIC PANEL
BUN: 34 — AB (ref 4–21)
CO2: 31 — AB (ref 13–22)
Chloride: 104 (ref 99–108)
Creatinine: 1 (ref 0.5–1.1)
Glucose: 97
Potassium: 4 (ref 3.4–5.3)
Sodium: 141 (ref 137–147)

## 2019-12-24 LAB — COMPREHENSIVE METABOLIC PANEL
Albumin: 3.3 — AB (ref 3.5–5.0)
Calcium: 8.8 (ref 8.7–10.7)
Globulin: 2.4

## 2019-12-24 LAB — CBC: RBC: 4.62 (ref 3.87–5.11)

## 2019-12-24 LAB — CBC AND DIFFERENTIAL
HCT: 41 (ref 36–46)
Hemoglobin: 13.3 (ref 12.0–16.0)
Neutrophils Absolute: 6173
Platelets: 166 (ref 150–399)
WBC: 9.6

## 2019-12-24 LAB — HEPATIC FUNCTION PANEL
ALT: 21 (ref 7–35)
AST: 19 (ref 13–35)
Alkaline Phosphatase: 61 (ref 25–125)
Bilirubin, Total: 0.5

## 2019-12-31 ENCOUNTER — Non-Acute Institutional Stay (SKILLED_NURSING_FACILITY): Payer: Medicare PPO | Admitting: Nurse Practitioner

## 2019-12-31 ENCOUNTER — Encounter: Payer: Self-pay | Admitting: Nurse Practitioner

## 2019-12-31 DIAGNOSIS — I10 Essential (primary) hypertension: Secondary | ICD-10-CM | POA: Diagnosis not present

## 2019-12-31 DIAGNOSIS — J449 Chronic obstructive pulmonary disease, unspecified: Secondary | ICD-10-CM

## 2019-12-31 DIAGNOSIS — I872 Venous insufficiency (chronic) (peripheral): Secondary | ICD-10-CM | POA: Diagnosis not present

## 2019-12-31 DIAGNOSIS — R609 Edema, unspecified: Secondary | ICD-10-CM | POA: Diagnosis not present

## 2019-12-31 DIAGNOSIS — Z20828 Contact with and (suspected) exposure to other viral communicable diseases: Secondary | ICD-10-CM | POA: Diagnosis not present

## 2019-12-31 DIAGNOSIS — K5900 Constipation, unspecified: Secondary | ICD-10-CM | POA: Diagnosis not present

## 2019-12-31 NOTE — Progress Notes (Signed)
Location:  [place] Room Number: 28-A Place of Service:  SNF (31) Provider:  Gizella [place] X, NP  Patient Care Team: [name], MD as PCP - General (Internal Medicine) [place] [place] Jhanvi [place] X, NP as Nurse Practitioner (Nurse Practitioner)  Extended Emergency Contact Information Primary Emergency Contact: [name] [place] of [place] Phone: [phone] Relation: Son Secondary Emergency Contact: [place] Phone: [phone] Mobile Phone: [phone] Relation: Daughter  Code Status:  DNR Goals of care: Advanced Directive information Advanced Directives 12/31/2019  Does Patient Have a Medical Advance Directive? Yes  Type of Advance Directive Out of facility DNR (pink MOST or yellow form)  Does patient want to make changes to medical advance directive? No - Patient declined  Copy of [place] in Chart? -  Would patient like information on creating a medical advance directive? -  Pre-existing out of facility DNR order (yellow form or pink MOST form) Yellow form placed in chart (order not valid for inpatient use)     Chief Complaint  Patient presents with  . Acute Visit    Patient is seen for constipation.    HPI:  Pt is a 86 y.o. female seen today for an acute visit for constipation. The patient resides in SNF [facility] for safety, care assistance. C/o constipation, prn x1 DulcoLax suppository x1 12/30/19 with result. Hx of Constipation, on Colace 212m qd, prn MOM. BLE edema, persisted, on Furosemide 462mqd. COPD, stable, on DuoNeb bid. HTN, blood pressure is controlled.    Past Medical History:  Diagnosis Date  . Abnormal liver function tests    11/11/15 AST 31, ALT 88, alk phos 96   . Acute kidney failure, unspecified ([facility]2/10/2012  . Arthralgia 09/26/2014   Multiple joints: knees, shoulders, wrists, spine hips   . Arthritis   . Candidiasis of other urogenital  sites 08/10/2012  . Cerebral embolism 05/23/2005  . CHF (congestive heart failure) ([facility]  . Cholelithiasis 11/04/2015  . Closed fracture of sacrum and coccyx without mention of spinal cord injury 02/14/2012  . Constipation 05/03/2013  . Contusion of face, scalp, and neck except eye(s) 06/01/2012  . Contusion of wrist 06/01/2012  . COPD (chronic obstructive pulmonary disease) ([facility]  . COPD, mild ([facility]2/07/2013  . Edema 04/20/2012  . H/O: CVA (cerebrovascular accident)   . Hearing loss 09/26/2014  . History of cancer of uterus   . HTN (hypertension), benign   . Hyperglycemia 11/14/2015  . Hypothyroidism   . Insomnia, unspecified 08/10/2012  . Open wound of knee, leg (except thigh), and ankle, without mention of complication 5/03/18/387. Other and unspecified hyperlipidemia 06/01/2012  . Other disorder of coccyx 01/27/2012  . Pain in joint, ankle and foot 06/14/2012  . Peripheral vascular disease, unspecified (H[facility]len Gardner2/14/2013  . Personal history of fall 01/27/2012  . Pneumonia, organism unspecified(486) 01/23/2005  . Rheumatic fever 09/26/1933   Age 76 10/01/14 ESR 13, RAF <10   . Seasonal allergies 05/03/2013  . Stasis dermatitis of both legs 09/26/2014  . Unspecified constipation 11/02/2012  . Unspecified hearing loss 02/08/2013  . Unspecified hereditary and idiopathic peripheral neuropathy 01/27/2012  . Urinary frequency 02/24/2014  . Ventricular fibrillation (HC[facility]ossier [facility]2/10/2012   Past Surgical History:  Procedure Laterality Date  . ABDOMINAL HYSTERECTOMY  1990   for endometrial cancer  . CATARACT EXTRACTION W/ INTRAOCULAR LENS  IMPLANT, BILATERAL    . [place].  GUM SURGERY  1932  . MASTOIDECTOMY  1920   bilateral  . THYROIDECTOMY  1960  . TONSILLECTOMY  1916    Allergies  Allergen Reactions  . Amoxicillin     Unknown, patient unable to answer questionnaire   . Aspirin Other (See Comments)    On MAR  . Avelox [Moxifloxacin]     Unknown: listed on MAR  .  Erythromycin     Unknown: listed on MAR  . Monistat [Miconazole]     Unknown: listed on MAR  . Morphine And Related     Unknown: listed on MAR  . Orange Juice [Orange Oil]     Unknown: [facility] on [facility]    Outpatient Encounter Medications as of 12/31/2019  Medication Sig  . aluminum-magnesium hydroxide-simethicone (MAALOX) 662-947-65 MG/5ML SUSP Take 30 mLs by mouth every 6 (six) hours as needed.   Marland Kitchen aspirin 81 MG chewable tablet Chew 81 mg by mouth daily.  . calcium citrate-vitamin D (CITRACAL+D) 315-200 MG-UNIT per tablet Take 1 tablet by mouth daily.  . carboxymethylcellulose (REFRESH TEARS) 0.5 % SOLN Place 1 drop into both eyes 2 (two) times daily.  Marland Kitchen docusate sodium (COLACE) 100 MG capsule Take 200 mg by mouth at bedtime.   . Eyelid Cleansers (OCUSOFT LID SCRUB ALLERGY) PADS 1 WIPE PER EYE,CLEANSE BILATERAL LIDS TWICE DAILY FOR EXCESS EYE SECRETIONS  . fexofenadine (ALLEGRA) 60 MG tablet Take 60 mg by mouth daily.  . furosemide (LASIX) 20 MG tablet Take 40 mg by mouth daily.   Marland Kitchen ipratropium-albuterol (DUONEB) 0.5-2.5 (3) MG/3ML SOLN Take 3 mLs by nebulization 2 (two) times daily.   Marland Kitchen levothyroxine (SYNTHROID, LEVOTHROID) 150 MCG tablet Take 150 mcg by mouth daily.   . magnesium hydroxide (MILK OF MAGNESIA) 400 MG/5ML suspension Take 30 mLs by mouth daily as needed for mild constipation.  . Melatonin 3 MG TABS Take 3 mg by mouth at bedtime.   . Multiple Vitamin (MULTIVITAMIN WITH MINERALS) TABS tablet Take 1 tablet by mouth daily. Radiation protection practitioner  . potassium chloride SA (K-DUR) 20 MEQ tablet Take 20 mEq by mouth daily.  . sennosides-docusate sodium (SENOKOT-S) 8.6-50 MG tablet Take 1 tablet by mouth at bedtime.  Marland Kitchen zinc oxide 20 % ointment Apply 1 application topically as needed for irritation (To buttocks after every incontinent episode and for redness.).   . [DISCONTINUED] ofloxacin (OCUFLOX) 0.3 % ophthalmic solution 1 drop 4 (four) times daily.  . [DISCONTINUED] white petrolatum  (VASELINE) GEL Apply 1 application topically daily. Apply to bilateral feet for dry scaly skin.   No facility-administered encounter medications on file as of 12/31/2019.   ROS was provided with assistance of staff.  Review of Systems  Constitutional: Negative for activity change, chills, diaphoresis, fatigue and fever.  HENT: Positive for hearing loss. Negative for congestion and voice change.   Eyes: Negative for visual disturbance.  Respiratory: Positive for shortness of breath. Negative for cough and wheezing.        DOE  Gastrointestinal: Positive for constipation. Negative for abdominal distention, abdominal pain, diarrhea, nausea and vomiting.  Genitourinary: Negative for difficulty urinating, dysuria and urgency.  Musculoskeletal: Positive for arthralgias and gait problem.  Skin: Negative for color change and pallor.  Neurological: Negative for dizziness, facial asymmetry, speech difficulty, weakness and headaches.       Memory lapses.   Psychiatric/Behavioral: Negative for agitation, behavioral problems, hallucinations and sleep disturbance. The patient is not nervous/anxious.     Immunization History  Administered Date(s) Administered  . Influenza  Whole 10/19/2012, 09/15/2018  . Influenza, High Dose Seasonal PF 09/14/2019  . Influenza-Unspecified 10/18/2013, 10/17/2014, 09/02/2015, 09/29/2016, 10/03/2017  . PPD Test 01/22/2013, 11/28/2015  . Pneumococcal Conjugate-13 11/18/2017  . Pneumococcal Polysaccharide-23 01/22/2013  . Td 02/21/2009  . Tdap 04/07/2015   Pertinent  Health Maintenance Due  Topic Date Due  . INFLUENZA VACCINE  Completed  . PNA vac Low Risk Adult  Completed   Fall Risk  09/05/2018 09/02/2017 11/14/2015 04/17/2015 09/26/2014  Falls in the past year? Yes No No Yes No  Number falls in past yr: 2 or more - - 1 -  Injury with Fall? Yes - - Yes -  Comment - - - laceration forehead 04/07/15 -  Risk Factor Category  - - - High Fall Risk -  Risk for fall due to  : - - Impaired mobility;Impaired vision - -   Functional Status Survey:    Vitals:   12/31/19 1555  BP: 130/70  Pulse: 78  Resp: 18  Temp: (!) 97 F (36.1 C)  TempSrc: Oral  SpO2: 96%  Weight: 151 lb 14.4 oz (68.9 kg)  Height: 5' (1.524 m)   Body mass index is 29.67 kg/m. Physical Exam Vitals and nursing note reviewed.  Constitutional:      General: She is not in acute distress.    Appearance: Normal appearance. She is not ill-appearing, toxic-appearing or diaphoretic.  HENT:     Head: Normocephalic and atraumatic.     Nose: Nose normal.     Mouth/Throat:     Mouth: Mucous membranes are moist.  Eyes:     Extraocular Movements: Extraocular movements intact.     Conjunctiva/sclera: Conjunctivae normal.     Pupils: Pupils are equal, round, and reactive to light.  Cardiovascular:     Rate and Rhythm: Normal rate and regular rhythm.     Heart sounds: No murmur.  Pulmonary:     Breath sounds: Rales present. No rhonchi.     Comments: Bibasilar rales.  Abdominal:     General: Bowel sounds are normal. There is no distension.     Palpations: Abdomen is soft.     Tenderness: There is no abdominal tenderness. There is no right CVA tenderness, left CVA tenderness, guarding or rebound.  Musculoskeletal:     Cervical back: Normal range of motion and neck supple.     Right lower leg: Edema present.     Left lower leg: Edema present.     Comments: Trace to 1+ edema BLE  Skin:    General: Skin is warm and dry.  Neurological:     General: No focal deficit present.     Mental Status: She is alert. Mental status is at baseline.     Motor: No weakness.     Coordination: Coordination normal.     Gait: Gait abnormal.     Comments: Oriented to person, place.   Psychiatric:        Mood and Affect: Mood normal.        Behavior: Behavior normal.     Labs reviewed: Recent Labs    01/18/19 0000 08/15/19 0000  NA 139 137  K 4.0 4.4  CL 104  --   CO2 29  --   BUN 36* 38*    CREATININE 0.9 1.1  CALCIUM 8.8  --    Recent Labs    01/18/19 0000 08/15/19 0000  AST 17 25  ALT 14 17  ALKPHOS 54 64  PROT 5.9  --  ALBUMIN 3.6  --    Recent Labs    01/18/19 0000 08/15/19 0000  WBC 6.7 10.6  HGB 13.6 14.9  HCT 41 46  PLT 190 232   Lab Results  Component Value Date   TSH 2.72 08/15/2019   No results found for: HGBA1C No results found for: CHOL, HDL, LDLCALC, LDLDIRECT, TRIG, CHOLHDL  Significant Diagnostic Results in last 30 days:  No results found.  Assessment/Plan Constipation 12/30/19 Dulcolax suppository x1 with result 12/31/19  adding Senokot S I qd. Continue Colace, prn MOM. Observe.   Edema of both lower legs due to peripheral venous insufficiency Persisted, continue Furosemide.   COPD, mild (HCC) Stable, continue DuoNeb bid.   HTN (hypertension) Blood pressure is controlled.     Family/ staff Communication: plan of care reviewed with the patient and charge nurse.   Labs/tests ordered:  None  Time spend 25 minutes.

## 2020-01-01 ENCOUNTER — Encounter: Payer: Self-pay | Admitting: Nurse Practitioner

## 2020-01-01 NOTE — Assessment & Plan Note (Signed)
Blood pressure is controlled

## 2020-01-01 NOTE — Assessment & Plan Note (Signed)
Stable, continue DuoNeb bid.

## 2020-01-01 NOTE — Assessment & Plan Note (Signed)
12/30/19 Dulcolax suppository x1 with result 12/31/19  adding Senokot S I qd. Continue Colace, prn MOM. Observe.

## 2020-01-01 NOTE — Assessment & Plan Note (Signed)
Persisted, continue Furosemide.  

## 2020-01-07 DIAGNOSIS — Z20828 Contact with and (suspected) exposure to other viral communicable diseases: Secondary | ICD-10-CM | POA: Diagnosis not present

## 2020-01-14 ENCOUNTER — Non-Acute Institutional Stay (SKILLED_NURSING_FACILITY): Payer: Medicare PPO | Admitting: Nurse Practitioner

## 2020-01-14 ENCOUNTER — Encounter: Payer: Self-pay | Admitting: Nurse Practitioner

## 2020-01-14 DIAGNOSIS — R609 Edema, unspecified: Secondary | ICD-10-CM

## 2020-01-14 DIAGNOSIS — R2681 Unsteadiness on feet: Secondary | ICD-10-CM | POA: Diagnosis not present

## 2020-01-14 DIAGNOSIS — R600 Localized edema: Secondary | ICD-10-CM

## 2020-01-14 DIAGNOSIS — I10 Essential (primary) hypertension: Secondary | ICD-10-CM | POA: Diagnosis not present

## 2020-01-14 DIAGNOSIS — W19XXXA Unspecified fall, initial encounter: Secondary | ICD-10-CM

## 2020-01-14 DIAGNOSIS — I872 Venous insufficiency (chronic) (peripheral): Secondary | ICD-10-CM | POA: Diagnosis not present

## 2020-01-14 DIAGNOSIS — Z20828 Contact with and (suspected) exposure to other viral communicable diseases: Secondary | ICD-10-CM | POA: Diagnosis not present

## 2020-01-14 NOTE — Assessment & Plan Note (Addendum)
Fall 01/13/20 when the patient was found lying on her right side, right scalp laceration, Telfa applied. Poor safety awareness and unsteady gait are contributory. Close supervision for safety.

## 2020-01-14 NOTE — Progress Notes (Signed)
Location:   [place] Room Number: 28 Place of Service:  SNF (31) Provider:  Rollan Roger NP  [name], MD  Patient Care Team: [name], MD as PCP - General (Internal Medicine) [place], [place], [facility] Giovana Faciane X, NP as Nurse Practitioner (Nurse Practitioner)  Extended Emergency Contact Information Primary Emergency Contact: [name] States of [place] Phone: [phone] Relation: Son Secondary Emergency Contact: [place] Phone: [phone] Mobile Phone: [phone] Relation: Daughter  Code Status:  DNR Goals of care: Advanced Directive information Advanced Directives 01/14/2020  Does Patient Have a Medical Advance Directive? Yes  Type of Advance Directive Out of facility DNR (pink MOST or yellow form);Living will;Healthcare Power of Attorney  Does patient want to make changes to medical advance directive? No - Patient declined  Copy of [place] in Chart? Yes - validated most recent copy scanned in chart (See row information)  Would patient like information on creating a medical advance directive? -  Pre-existing out of facility DNR order (yellow form or pink MOST form) Yellow form placed in chart (order not valid for inpatient use);Pink MOST form placed in chart (order not valid for inpatient use)     Chief Complaint  Patient presents with  . Acute Visit    Fall    HPI:  Pt is a 86 y.o. female seen today for an acute visit for Fall 01/13/20 when the patient was found lying on her right side, right scalp abrasion. The patient resides in SNF [facility] for safety, care assistance, she needs assistance for transfer, SBA for ambulation with walker. Hx of BLE edema, trace, on Furosemide 21m qd. HTN, blood pressure is controlled.    Past Medical History:  Diagnosis Date  . Abnormal liver function tests    11/11/15 AST 31, ALT 88, alk phos 96   . Acute kidney failure,  unspecified ([place] 01/24/2012  . Arthralgia 09/26/2014   Multiple joints: knees, shoulders, wrists, spine hips   . Arthritis   . Candidiasis of other urogenital sites 08/10/2012  . Cerebral embolism 05/23/2005  . CHF (congestive heart failure) ([facility]   . Cholelithiasis 11/04/2015  . Closed fracture of sacrum and coccyx without mention of spinal cord injury 02/14/2012  . Constipation 05/03/2013  . Contusion of face, scalp, and neck except eye(s) 06/01/2012  . Contusion of wrist 06/01/2012  . COPD (chronic obstructive pulmonary disease) ([place]   . COPD, mild ([facility] 01/20/2013  . Edema 04/20/2012  . H/O: CVA (cerebrovascular accident)   . Hearing loss 09/26/2014  . History of cancer of uterus   . HTN (hypertension), benign   . Hyperglycemia 11/14/2015  . Hypothyroidism   . Insomnia, unspecified 08/10/2012  . Open wound of knee, leg (except thigh), and ankle, without mention of complication 59/06/3531 . Other and unspecified hyperlipidemia 06/01/2012  . Other disorder of coccyx 01/27/2012  . Pain in joint, ankle and foot 06/14/2012  . Peripheral vascular disease, unspecified ([place] 01/27/2012  . Personal history of fall 01/27/2012  . Pneumonia, organism unspecified(486) 01/23/2005  . Rheumatic fever 09/26/1933   Age 18 10/01/14 ESR 13, RAF <10   . Seasonal allergies 05/03/2013  . Stasis dermatitis of both legs 09/26/2014  . Unspecified constipation 11/02/2012  . Unspecified hearing loss 02/08/2013  . Unspecified hereditary and idiopathic peripheral neuropathy 01/27/2012  . Urinary frequency 02/24/2014  . Ventricular fibrillation ([place] 01/24/2012   Past Surgical History:  Procedure Laterality Date  . ABDOMINAL  HYSTERECTOMY  1990   for endometrial cancer  . CATARACT EXTRACTION W/ INTRAOCULAR LENS  IMPLANT, BILATERAL    . [place]  . GUM SURGERY  1932  . MASTOIDECTOMY  1920   bilateral  . THYROIDECTOMY  1960  . TONSILLECTOMY  1916    Allergies  Allergen Reactions  . Amoxicillin      Unknown, patient unable to answer questionnaire   . Aspirin Other (See Comments)    On MAR  . Avelox [Moxifloxacin]     Unknown: listed on MAR  . Erythromycin     Unknown: listed on MAR  . Monistat [Miconazole]     Unknown: listed on MAR  . Morphine And Related     Unknown: listed on MAR  . Orange Juice [Orange Oil]     Unknown: listed on MAR    Allergies as of 01/14/2020      Reactions   Amoxicillin    Unknown, patient unable to answer questionnaire    Aspirin Other (See Comments)   On MAR   Avelox [moxifloxacin]    Unknown: listed on MAR   Erythromycin    Unknown: listed on MAR   Monistat [miconazole]    Unknown: listed on MAR   Morphine And Related    Unknown: listed on MAR   Orange Juice [orange Oil]    Unknown: listed on [facility]      Medication List       Accurate as of January 14, 2020 11:59 PM. If you have any questions, ask your nurse or doctor.        acetaminophen 325 MG tablet Commonly known as: TYLENOL Take 650 mg by mouth every 4 (four) hours as needed.   aluminum-magnesium hydroxide-simethicone 169-678-93 MG/5ML Susp Commonly known as: MAALOX Take 30 mLs by mouth every 6 (six) hours as needed.   aspirin 81 MG chewable tablet Chew 81 mg by mouth daily.   calcium citrate-vitamin D 315-200 MG-UNIT tablet Commonly known as: CITRACAL+D Take 1 tablet by mouth daily.   docusate sodium 100 MG capsule Commonly known as: COLACE Take 200 mg by mouth at bedtime.   fexofenadine 60 MG tablet Commonly known as: ALLEGRA Take 60 mg by mouth daily.   furosemide 20 MG tablet Commonly known as: LASIX Take 40 mg by mouth daily.   ipratropium-albuterol 0.5-2.5 (3) MG/3ML Soln Commonly known as: DUONEB Take 3 mLs by nebulization 2 (two) times daily.   levothyroxine 150 MCG tablet Commonly known as: SYNTHROID Take 150 mcg by mouth daily.   magnesium hydroxide 400 MG/5ML suspension Commonly known as: MILK OF MAGNESIA Take 30 mLs by mouth daily as needed  for mild constipation.   Melatonin 3 MG Tabs Take 3 mg by mouth at bedtime.   multivitamin with minerals Tabs tablet Take 1 tablet by mouth daily. Cerovite Senior   OcuSoft Lid Scrub Allergy Pads 1 WIPE PER EYE,CLEANSE BILATERAL LIDS TWICE DAILY FOR EXCESS EYE SECRETIONS   potassium chloride SA 20 MEQ tablet Commonly known as: KLOR-CON Take 20 mEq by mouth daily.   Refresh Tears 0.5 % Soln Generic drug: carboxymethylcellulose Place 1 drop into both eyes 2 (two) times daily.   sennosides-docusate sodium 8.6-50 MG tablet Commonly known as: SENOKOT-S Take 1 tablet by mouth at bedtime.   zinc oxide 20 % ointment Apply 1 application topically as needed for irritation (To buttocks after every incontinent episode and for redness.).      ROS was provided with assistance of staff.  Review of Systems  Constitutional: Negative for activity change, appetite change, chills, diaphoresis, fatigue and fever.  HENT: Positive for hearing loss. Negative for congestion and voice change.   Eyes: Negative for visual disturbance.  Respiratory: Positive for shortness of breath. Negative for cough and wheezing.        DOE  Cardiovascular: Positive for leg swelling. Negative for chest pain and palpitations.  Gastrointestinal: Negative for abdominal distention, abdominal pain, constipation, diarrhea, nausea and vomiting.  Genitourinary: Negative for difficulty urinating and dysuria.  Musculoskeletal: Positive for arthralgias and gait problem.  Skin: Positive for wound.       R scalp abrasion.   Neurological: Negative for dizziness, speech difficulty, weakness and headaches.       Memory lapses.   Psychiatric/Behavioral: Negative for agitation, behavioral problems, hallucinations and sleep disturbance. The patient is not nervous/anxious.     Immunization History  Administered Date(s) Administered  . Influenza Whole 10/19/2012, 09/15/2018  . Influenza, High Dose Seasonal PF 09/14/2019  .  Influenza-Unspecified 10/18/2013, 10/17/2014, 09/02/2015, 09/29/2016, 10/03/2017  . PPD Test 01/22/2013, 11/28/2015  . Pneumococcal Conjugate-13 11/18/2017  . Pneumococcal Polysaccharide-23 01/22/2013  . Td 02/21/2009  . Tdap 04/07/2015   Pertinent  Health Maintenance Due  Topic Date Due  . INFLUENZA VACCINE  Completed  . PNA vac Low Risk Adult  Completed   Fall Risk  09/05/2018 09/02/2017 11/14/2015 04/17/2015 09/26/2014  Falls in the past year? Yes No No Yes No  Number falls in past yr: 2 or more - - 1 -  Injury with Fall? Yes - - Yes -  Comment - - - laceration forehead 04/07/15 -  Risk Factor Category  - - - High Fall Risk -  Risk for fall due to : - - Impaired mobility;Impaired vision - -   Functional Status Survey:    Vitals:   01/14/20 1635  BP: 110/60  Pulse: 72  Resp: 20  Temp: (!) 96 F (35.6 C)  SpO2: 93%  Weight: 151 lb 14.4 oz (68.9 kg)  Height: 5' (1.524 m)   Body mass index is 29.67 kg/m. Physical Exam Vitals and nursing note reviewed.  Constitutional:      General: She is not in acute distress.    Appearance: Normal appearance. She is not ill-appearing, toxic-appearing or diaphoretic.  HENT:     Head: Normocephalic and atraumatic.     Nose: Nose normal.     Mouth/Throat:     Mouth: Mucous membranes are moist.  Eyes:     Extraocular Movements: Extraocular movements intact.     Conjunctiva/sclera: Conjunctivae normal.     Pupils: Pupils are equal, round, and reactive to light.  Cardiovascular:     Rate and Rhythm: Normal rate and regular rhythm.     Heart sounds: No murmur.  Pulmonary:     Breath sounds: Rales present. No wheezing or rhonchi.     Comments: Bibasilar rales, not new.  Abdominal:     General: Bowel sounds are normal. There is no distension.     Palpations: Abdomen is soft.     Tenderness: There is no abdominal tenderness. There is no right CVA tenderness, left CVA tenderness, guarding or rebound.  Musculoskeletal:     Cervical back:  Normal range of motion and neck supple.     Right lower leg: Edema present.     Left lower leg: Edema present.     Comments: Trace edema BLe  Skin:    General: Skin is warm and dry.  Comments: Right scalp abrasion, no s/s of infection.   Neurological:     General: No focal deficit present.     Mental Status: She is alert. Mental status is at baseline.     Motor: No weakness.     Gait: Gait abnormal.  Psychiatric:        Mood and Affect: Mood normal.        Behavior: Behavior normal.        Thought Content: Thought content normal.     Labs reviewed: Recent Labs    01/18/19 0000 08/15/19 0000  NA 139 137  K 4.0 4.4  CL 104  --   CO2 29  --   BUN 36* 38*  CREATININE 0.9 1.1  CALCIUM 8.8  --    Recent Labs    01/18/19 0000 08/15/19 0000  AST 17 25  ALT 14 17  ALKPHOS 54 64  PROT 5.9  --   ALBUMIN 3.6  --    Recent Labs    01/18/19 0000 08/15/19 0000  WBC 6.7 10.6  HGB 13.6 14.9  HCT 41 46  PLT 190 232   Lab Results  Component Value Date   TSH 2.72 08/15/2019   No results found for: HGBA1C No results found for: CHOL, HDL, LDLCALC, LDLDIRECT, TRIG, CHOLHDL  Significant Diagnostic Results in last 30 days:  No results found.  Assessment/Plan Fall Fall 01/13/20 when the patient was found lying on her right side, right scalp laceration, Telfa applied. Poor safety awareness and unsteady gait are contributory. Close supervision for safety.   Gait instability Continue assist the patient with transfer, SBA for ambulation with walker, close supervision for safety.   Edema of both lower legs due to peripheral venous insufficiency Trace edema BLE is chronic, continue Furosemide 69m qd.   HTN (hypertension) Blood pressure is controlled.      Family/ staff Communication: plan of care reviewed with the patient and charge nurse.   Labs/tests ordered:  none  Time spend 25 minutes.

## 2020-01-15 ENCOUNTER — Encounter: Payer: Self-pay | Admitting: Nurse Practitioner

## 2020-01-15 NOTE — Assessment & Plan Note (Signed)
Continue assist the patient with transfer, SBA for ambulation with walker, close supervision for safety.

## 2020-01-15 NOTE — Assessment & Plan Note (Signed)
Trace edema BLE is chronic, continue Furosemide 40mg  qd.

## 2020-01-15 NOTE — Assessment & Plan Note (Signed)
Blood pressure is controlled

## 2020-01-21 DIAGNOSIS — Z20828 Contact with and (suspected) exposure to other viral communicable diseases: Secondary | ICD-10-CM | POA: Diagnosis not present

## 2020-01-22 ENCOUNTER — Non-Acute Institutional Stay (SKILLED_NURSING_FACILITY): Payer: Medicare PPO | Admitting: Internal Medicine

## 2020-01-22 ENCOUNTER — Encounter: Payer: Self-pay | Admitting: Internal Medicine

## 2020-01-22 DIAGNOSIS — R404 Transient alteration of awareness: Secondary | ICD-10-CM

## 2020-01-22 DIAGNOSIS — I872 Venous insufficiency (chronic) (peripheral): Secondary | ICD-10-CM

## 2020-01-22 DIAGNOSIS — R600 Localized edema: Secondary | ICD-10-CM

## 2020-01-22 DIAGNOSIS — K5900 Constipation, unspecified: Secondary | ICD-10-CM | POA: Diagnosis not present

## 2020-01-22 DIAGNOSIS — I10 Essential (primary) hypertension: Secondary | ICD-10-CM

## 2020-01-22 DIAGNOSIS — R609 Edema, unspecified: Secondary | ICD-10-CM

## 2020-01-22 NOTE — Progress Notes (Signed)
Location:   [facility] Room Number: 28 Place of Service:  SNF [phone]) Provider:  [name], MD  [name], MD  Patient Care Team: [name], MD as PCP - General (Internal Medicine) [place], [place], [facility] Mast, Man X, NP as Nurse Practitioner (Nurse Practitioner)  Extended Emergency Contact Information Primary Emergency Contact: [name] States of [place] Phone: [phone] Relation: Son Secondary Emergency Contact: [place] Phone: [phone] Mobile Phone: [phone] Relation: Daughter  Code Status:  DNR Goals of care: Advanced Directive information Advanced Directives 01/22/2020  Does Patient Have a Medical Advance Directive? Yes  Type of Advance Directive Out of facility DNR (pink MOST or yellow form);Living will;Healthcare Power of Attorney  Does patient want to make changes to medical advance directive? No - Patient declined  Copy of [place] in Chart? Yes - validated most recent copy scanned in chart (See row information)  Would patient like information on creating a medical advance directive? -  Pre-existing out of facility DNR order (yellow form or pink MOST form) Yellow form placed in chart (order not valid for inpatient use);Pink MOST form placed in chart (order not valid for inpatient use)     Chief Complaint  Patient presents with  . Medical Management of Chronic Issues    HPI:  Pt is a 86 y.o. female seen today for medical management of chronic diseases.   Patient has h/o Bilateral LE edema , Hypertension, Hard of hearing, h/o Cellulitis, Hypothyroidism, COPD, Chronic Conjunctivitis Recently had number of Falls  Her weight is better. On Lasix. Unable to get much history as she is very Hard of hearing. Sleeps most of the time on her recliner. Also had Episode which looked like TIA but she has recovered From that also No Other Nursing  Issues  Past Medical History:  Diagnosis Date  . Abnormal liver function tests    11/11/15 AST 31, ALT 88, alk phos 96   . Acute kidney failure, unspecified ([place]) 01/24/2012  . Arthralgia 09/26/2014   Multiple joints: knees, shoulders, wrists, spine hips   . Arthritis   . Candidiasis of other urogenital sites 08/10/2012  . Cerebral embolism 05/23/2005  . CHF (congestive heart failure) ([place])   . Cholelithiasis 11/04/2015  . Closed fracture of sacrum and coccyx without mention of spinal cord injury 02/14/2012  . Constipation 05/03/2013  . Contusion of face, scalp, and neck except eye(s) 06/01/2012  . Contusion of wrist 06/01/2012  . COPD (chronic obstructive pulmonary disease) ([place])   . COPD, mild ([place]) 01/20/2013  . Edema 04/20/2012  . H/O: CVA (cerebrovascular accident)   . Hearing loss 09/26/2014  . History of cancer of uterus   . HTN (hypertension), benign   . Hyperglycemia 11/14/2015  . Hypothyroidism   . Insomnia, unspecified 08/10/2012  . Open wound of knee, leg (except thigh), and ankle, without mention of complication 6/0/1093  . Other and unspecified hyperlipidemia 06/01/2012  . Other disorder of coccyx 01/27/2012  . Pain in joint, ankle and foot 06/14/2012  . Peripheral vascular disease, unspecified ([place]) 01/27/2012  . Personal history of fall 01/27/2012  . Pneumonia, organism unspecified(486) 01/23/2005  . Rheumatic fever 09/26/1933   Age 34 10/01/14 ESR 13, RAF <10   . Seasonal allergies 05/03/2013  . Stasis dermatitis of both legs 09/26/2014  . Unspecified constipation 11/02/2012  . Unspecified hearing loss 02/08/2013  . Unspecified hereditary and idiopathic peripheral neuropathy 01/27/2012  . Urinary frequency  02/24/2014  . Ventricular fibrillation ([place]) 01/24/2012   Past Surgical History:  Procedure Laterality Date  . ABDOMINAL HYSTERECTOMY  1990   for endometrial cancer  . CATARACT EXTRACTION W/ INTRAOCULAR LENS  IMPLANT, BILATERAL    . [place]  . GUM  SURGERY  1932  . MASTOIDECTOMY  1920   bilateral  . THYROIDECTOMY  1960  . TONSILLECTOMY  1916    Allergies  Allergen Reactions  . Amoxicillin     Unknown, patient unable to answer questionnaire   . Aspirin Other (See Comments)    On MAR  . Avelox [Moxifloxacin]     Unknown: listed on MAR  . Erythromycin     Unknown: listed on MAR  . Monistat [Miconazole]     Unknown: listed on MAR  . Morphine And Related     Unknown: listed on MAR  . Orange Juice [Orange Oil]     Unknown: listed on MAR    Allergies as of 01/22/2020      Reactions   Amoxicillin    Unknown, patient unable to answer questionnaire    Aspirin Other (See Comments)   On MAR   Avelox [moxifloxacin]    Unknown: listed on MAR   Erythromycin    Unknown: listed on MAR   Monistat [miconazole]    Unknown: listed on MAR   Morphine And Related    Unknown: listed on MAR   Orange Juice [orange Oil]    Unknown: listed on [facility]      Medication List       Accurate as of January 22, 2020 10:37 AM. If you have any questions, ask your nurse or doctor.        STOP taking these medications   acetaminophen 325 MG tablet Commonly known as: TYLENOL Stopped by: [name], MD     TAKE these medications   aluminum-magnesium hydroxide-simethicone 989-211-94 MG/5ML Susp Commonly known as: MAALOX Take 30 mLs by mouth every 6 (six) hours as needed.   aspirin 81 MG chewable tablet Chew 81 mg by mouth daily.   calcium citrate-vitamin D 315-200 MG-UNIT tablet Commonly known as: CITRACAL+D Take 1 tablet by mouth daily.   docusate sodium 100 MG capsule Commonly known as: COLACE Take 200 mg by mouth at bedtime.   fexofenadine 60 MG tablet Commonly known as: ALLEGRA Take 60 mg by mouth daily.   furosemide 20 MG tablet Commonly known as: LASIX Take 40 mg by mouth daily.   ipratropium-albuterol 0.5-2.5 (3) MG/3ML Soln Commonly known as: DUONEB Take 3 mLs by nebulization 2 (two) times daily.   levothyroxine  150 MCG tablet Commonly known as: SYNTHROID Take 150 mcg by mouth daily.   magnesium hydroxide 400 MG/5ML suspension Commonly known as: MILK OF MAGNESIA Take 30 mLs by mouth daily as needed for mild constipation.   Melatonin 3 MG Tabs Take 3 mg by mouth at bedtime.   multivitamin with minerals Tabs tablet Take 1 tablet by mouth daily. Cerovite Senior   OcuSoft Lid Scrub Allergy Pads 1 WIPE PER EYE,CLEANSE BILATERAL LIDS TWICE DAILY FOR EXCESS EYE SECRETIONS   potassium chloride SA 20 MEQ tablet Commonly known as: KLOR-CON Take 20 mEq by mouth daily.   Refresh Tears 0.5 % Soln Generic drug: carboxymethylcellulose Place 1 drop into both eyes 2 (two) times daily.   sennosides-docusate sodium 8.6-50 MG tablet Commonly known as: SENOKOT-S Take 1 tablet by mouth at bedtime.   zinc oxide 20 % ointment Apply 1 application  topically as needed for irritation (To buttocks after every incontinent episode and for redness.).       Review of Systems  Unable to perform ROS: Other    Immunization History  Administered Date(s) Administered  . Influenza Whole 10/19/2012, 09/15/2018  . Influenza, High Dose Seasonal PF 09/14/2019  . Influenza-Unspecified 10/18/2013, 10/17/2014, 09/02/2015, 09/29/2016, 10/03/2017  . PPD Test 01/22/2013, 11/28/2015  . Pneumococcal Conjugate-13 11/18/2017  . Pneumococcal Polysaccharide-23 01/22/2013  . Td 02/21/2009  . Tdap 04/07/2015   Pertinent  Health Maintenance Due  Topic Date Due  . INFLUENZA VACCINE  Completed  . PNA vac Low Risk Adult  Completed   Fall Risk  09/05/2018 09/02/2017 11/14/2015 04/17/2015 09/26/2014  Falls in the past year? Yes No No Yes No  Number falls in past yr: 2 or more - - 1 -  Injury with Fall? Yes - - Yes -  Comment - - - laceration forehead 04/07/15 -  Risk Factor Category  - - - High Fall Risk -  Risk for fall due to : - - Impaired mobility;Impaired vision - -   Functional Status Survey:    Vitals:   01/22/20 1024   BP: (!) 116/58  Pulse: 88  Resp: 18  Temp: (!) 96.9 F (36.1 C)  SpO2: 91%  Weight: 146 lb 9.6 oz (66.5 kg)  Height: 5' (1.524 m)   Body mass index is 28.63 kg/m. Physical Exam Vitals reviewed.  Constitutional:      Appearance: Normal appearance.  HENT:     Head: Normocephalic.     Nose: Nose normal.     Mouth/Throat:     Mouth: Mucous membranes are moist.     Pharynx: Oropharynx is clear.  Eyes:     Pupils: Pupils are equal, round, and reactive to light.  Cardiovascular:     Rate and Rhythm: Normal rate and regular rhythm.     Pulses: Normal pulses.  Pulmonary:     Effort: Pulmonary effort is normal. No respiratory distress.     Breath sounds: Normal breath sounds. No wheezing or rales.  Abdominal:     General: Abdomen is flat. Bowel sounds are normal.     Palpations: Abdomen is soft.  Musculoskeletal:     Cervical back: Neck supple.     Comments: Mild Edema Bilateral  Skin:    General: Skin is warm.  Neurological:     General: No focal deficit present.     Mental Status: She is alert.  Psychiatric:        Mood and Affect: Mood normal.        Thought Content: Thought content normal.     Labs reviewed: Recent Labs    08/15/19 0000 12/24/19 0000  NA 137 141  K 4.4 4.0  CL  --  104  CO2  --  31*  BUN 38* 34*  CREATININE 1.1 1.0  CALCIUM  --  8.8   Recent Labs    08/15/19 0000 12/24/19 0000  AST 25 19  ALT 17 21  ALKPHOS 64 61  ALBUMIN  --  3.3*   Recent Labs    08/15/19 0000 12/24/19 0000  WBC 10.6 9.6  NEUTROABS  --  6,173  HGB 14.9 13.3  HCT 46 41  PLT 232 166   Lab Results  Component Value Date   TSH 2.72 08/15/2019   No results found for: HGBA1C No results found for: CHOL, HDL, LDLCALC, LDLDIRECT, TRIG, CHOLHDL  Significant Diagnostic Results in last 30  days:  No results found.  Assessment/Plan Bilateral leg edema Stable on Lasix Continue Same dose BUN and Creat Stable Hypothyroidism, unspecified type TSH was 2.72  9/20  Essential hypertension Stable on Lasix ? TIA On Aspirin No aggressive treatment Constipation On Senna and Colace MOM PRN   Family/ staff Communication:   Labs/tests ordered:    Total time spent in this patient care encounter was  25_  minutes; greater than 50% of the visit spent counseling  staff, reviewing records , Labs and coordinating care for problems addressed at this encounter.

## 2020-01-28 DIAGNOSIS — Z20828 Contact with and (suspected) exposure to other viral communicable diseases: Secondary | ICD-10-CM | POA: Diagnosis not present

## 2020-02-13 ENCOUNTER — Non-Acute Institutional Stay (SKILLED_NURSING_FACILITY): Payer: Medicare PPO | Admitting: Nurse Practitioner

## 2020-02-13 ENCOUNTER — Encounter: Payer: Self-pay | Admitting: Nurse Practitioner

## 2020-02-13 DIAGNOSIS — R609 Edema, unspecified: Secondary | ICD-10-CM | POA: Diagnosis not present

## 2020-02-13 DIAGNOSIS — T7840XD Allergy, unspecified, subsequent encounter: Secondary | ICD-10-CM | POA: Diagnosis not present

## 2020-02-13 DIAGNOSIS — I872 Venous insufficiency (chronic) (peripheral): Secondary | ICD-10-CM | POA: Diagnosis not present

## 2020-02-13 DIAGNOSIS — J449 Chronic obstructive pulmonary disease, unspecified: Secondary | ICD-10-CM | POA: Diagnosis not present

## 2020-02-13 DIAGNOSIS — E890 Postprocedural hypothyroidism: Secondary | ICD-10-CM | POA: Diagnosis not present

## 2020-02-13 DIAGNOSIS — K5900 Constipation, unspecified: Secondary | ICD-10-CM

## 2020-02-13 NOTE — Assessment & Plan Note (Signed)
[place], continue DuoNeb bid.

## 2020-02-13 NOTE — Progress Notes (Signed)
Location:   [facility] Room Number: 28 Place of Service:  SNF (31) Provider:  Zolton Dowson, NP  [name], MD  Patient Care Team: [name], MD as PCP - General (Internal Medicine) [place] [place] Tramain Gershman X, NP as Nurse Practitioner (Nurse Practitioner)  Extended Emergency Contact Information Primary Emergency Contact: [name] [place] of [place] Phone: [phone] Relation: Son Secondary Emergency Contact: [place] Phone: [phone] Mobile Phone: [phone] Relation: Daughter  Code Status:  DNR Goals of care: Advanced Directive information Advanced Directives 02/13/2020  Does Patient Have a Medical Advance Directive? Yes  Type of Paramedic of [place];Living will;Out of facility DNR (pink MOST or yellow form)  Does patient want to make changes to medical advance directive? No - Patient declined  Copy of [place] in Chart? Yes - validated most recent copy scanned in chart (See row information)  Would patient like information on creating a medical advance directive? -  Pre-existing out of facility DNR order (yellow form or pink MOST form) Pink MOST/Yellow Form most recent copy in chart - Physician notified to receive inpatient order     Chief Complaint  Patient presents with  . Medical Management of Chronic Issues    Routine Visit     HPI:  Pt is a 86 y.o. female seen today for medical management of chronic diseases.    The patient resides in SNF [facility] for safety, care assistance, ambulates with walker, w/c to go further. Hx of constipation, stable, on Colace 275m qd, MOM prn, Senokot S I qhs. Allergic rhinitis, stable, on Allegra 655mqd. COPD, stable, on DuoNeb bid. Edema BLE, chronic, on Furosemide 4043md. Hypothyroidism, stable, on Levothyroxine 150m23md.    Past Medical History:  Diagnosis Date  . Abnormal liver function  tests    11/11/15 AST 31, ALT 88, alk phos 96   . Acute kidney failure, unspecified (HCC)[place]11/2013  . Arthralgia 09/26/2014   Multiple joints: knees, shoulders, wrists, spine hips   . Arthritis   . Candidiasis of other urogenital sites 08/10/2012  . Cerebral embolism 05/23/2005  . CHF (congestive heart failure) (HCC)[place]. Cholelithiasis 11/04/2015  . Closed fracture of sacrum and coccyx without mention of spinal cord injury 02/14/2012  . Constipation 05/03/2013  . Contusion of face, scalp, and neck except eye(s) 06/01/2012  . Contusion of wrist 06/01/2012  . COPD (chronic obstructive pulmonary disease) (HCC)[place]. COPD, mild (HCC)[place]8/2014  . Edema 04/20/2012  . H/O: CVA (cerebrovascular accident)   . Hearing loss 09/26/2014  . History of cancer of uterus   . HTN (hypertension), benign   . Hyperglycemia 11/14/2015  . Hypothyroidism   . Insomnia, unspecified 08/10/2012  . Open wound of knee, leg (except thigh), and ankle, without mention of complication 5/9/5/3/6144Other and unspecified hyperlipidemia 06/01/2012  . Other disorder of coccyx 01/27/2012  . Pain in joint, ankle and foot 06/14/2012  . Peripheral vascular disease, unspecified (HCC)[place]14/2013  . Personal history of fall 01/27/2012  . Pneumonia, organism unspecified(486) 01/23/2005  . Rheumatic fever 09/26/1933   Age 48 10/01/14 ESR 13, RAF <10   . Seasonal allergies 05/03/2013  . Stasis dermatitis of both legs 09/26/2014  . Unspecified constipation 11/02/2012  . Unspecified hearing loss 02/08/2013  . Unspecified hereditary and idiopathic peripheral neuropathy 01/27/2012  . Urinary frequency 02/24/2014  . Ventricular fibrillation (HCC)[place]11/2013   Past  Surgical History:  Procedure Laterality Date  . ABDOMINAL HYSTERECTOMY  1990   for endometrial cancer  . CATARACT EXTRACTION W/ INTRAOCULAR LENS  IMPLANT, BILATERAL    . [place]  . GUM SURGERY  1932  . MASTOIDECTOMY  1920   bilateral  . THYROIDECTOMY  1960  .  TONSILLECTOMY  1916    Allergies  Allergen Reactions  . Amoxicillin     Unknown, patient unable to answer questionnaire   . Aspirin Other (See Comments)    On MAR  . Avelox [Moxifloxacin]     Unknown: listed on MAR  . Erythromycin     Unknown: listed on MAR  . Monistat [Miconazole]     Unknown: listed on MAR  . Morphine And Related     Unknown: listed on MAR  . Orange Juice [Orange Oil]     Unknown: listed on MAR    Allergies as of 02/13/2020      Reactions   Amoxicillin    Unknown, patient unable to answer questionnaire    Aspirin Other (See Comments)   On MAR   Avelox [moxifloxacin]    Unknown: listed on MAR   Erythromycin    Unknown: listed on MAR   Monistat [miconazole]    Unknown: listed on MAR   Morphine And Related    Unknown: listed on MAR   Orange Juice [orange Oil]    Unknown: listed on [facility]      Medication List       Accurate as of February 13, 2020  3:57 PM. If you have any questions, ask your nurse or doctor.        acetaminophen 325 MG tablet Commonly known as: TYLENOL Take 650 mg by mouth every 4 (four) hours as needed. ENDS 02/14/2020   aluminum-magnesium hydroxide-simethicone 891-694-50 MG/5ML Susp Commonly known as: MAALOX Take 30 mLs by mouth every 6 (six) hours as needed.   aspirin 81 MG chewable tablet Chew 81 mg by mouth daily.   calcium citrate-vitamin D 315-200 MG-UNIT tablet Commonly known as: CITRACAL+D Take 1 tablet by mouth daily.   docusate sodium 100 MG capsule Commonly known as: COLACE Take 200 mg by mouth at bedtime.   fexofenadine 60 MG tablet Commonly known as: ALLEGRA Take 60 mg by mouth daily.   furosemide 20 MG tablet Commonly known as: LASIX Take 40 mg by mouth daily.   ipratropium-albuterol 0.5-2.5 (3) MG/3ML Soln Commonly known as: DUONEB Take 3 mLs by nebulization 2 (two) times daily.   levothyroxine 150 MCG tablet Commonly known as: SYNTHROID Take 150 mcg by mouth daily.   magnesium hydroxide 400 MG/5ML  suspension Commonly known as: MILK OF MAGNESIA Take 30 mLs by mouth daily as needed for mild constipation.   Melatonin 3 MG Tabs Take 3 mg by mouth at bedtime.   multivitamin with minerals Tabs tablet Take 1 tablet by mouth daily. Cerovite Senior   OcuSoft Lid Scrub Allergy Pads 1 WIPE PER EYE,CLEANSE BILATERAL LIDS TWICE DAILY FOR EXCESS EYE SECRETIONS   potassium chloride SA 20 MEQ tablet Commonly known as: KLOR-CON Take 20 mEq by mouth daily.   Refresh Tears 0.5 % Soln Generic drug: carboxymethylcellulose Place 1 drop into both eyes 2 (two) times daily.   sennosides-docusate sodium 8.6-50 MG tablet Commonly known as: SENOKOT-S Take 1 tablet by mouth at bedtime.   zinc oxide 20 % ointment Apply 1 application topically as needed for irritation (To buttocks after every incontinent episode and for redness.).  ROS was provided with assistance of staff.  Review of Systems  Constitutional: Negative for activity change, appetite change, chills, diaphoresis, fatigue, fever and unexpected weight change.  HENT: Positive for hearing loss. Negative for congestion and voice change.   Eyes: Negative for visual disturbance.  Respiratory: Positive for shortness of breath. Negative for cough and wheezing.        DOE  Cardiovascular: Positive for leg swelling. Negative for chest pain and palpitations.  Gastrointestinal: Negative for abdominal distention, abdominal pain, constipation, nausea and vomiting.  Genitourinary: Negative for difficulty urinating, dysuria and urgency.  Musculoskeletal: Positive for arthralgias and gait problem.  Skin: Positive for wound. Negative for color change and pallor.  Neurological: Negative for dizziness, speech difficulty, weakness and headaches.       Memory lapses.   Psychiatric/Behavioral: Negative for agitation, behavioral problems and sleep disturbance. The patient is not nervous/anxious.     Immunization History  Administered Date(s)  Administered  . Influenza Whole 10/19/2012, 09/15/2018  . Influenza, High Dose Seasonal PF 09/14/2019  . Influenza-Unspecified 10/18/2013, 10/17/2014, 09/02/2015, 09/29/2016, 10/03/2017  . Moderna SARS-COVID-2 Vaccination 12/15/2019, 01/12/2020  . PPD Test 01/22/2013, 11/28/2015  . Pneumococcal Conjugate-13 11/18/2017  . Pneumococcal Polysaccharide-23 01/22/2013  . Td 02/21/2009  . Tdap 04/07/2015   Pertinent  Health Maintenance Due  Topic Date Due  . INFLUENZA VACCINE  Completed  . PNA vac Low Risk Adult  Completed   Fall Risk  09/05/2018 09/02/2017 11/14/2015 04/17/2015 09/26/2014  Falls in the past year? Yes No No Yes No  Number falls in past yr: 2 or more - - 1 -  Injury with Fall? Yes - - Yes -  Comment - - - laceration forehead 04/07/15 -  Risk Factor Category  - - - High Fall Risk -  Risk for fall due to : - - Impaired mobility;Impaired vision - -   Functional Status Survey:    Vitals:   02/13/20 1122  BP: 130/70  Pulse: 78  Resp: 20  Temp: 98 F (36.7 C)  SpO2: 95%  Weight: 149 lb 12.8 oz (67.9 kg)  Height: 5' (1.524 m)   Body mass index is 29.26 kg/m. Physical Exam Vitals and nursing note reviewed.  Constitutional:      General: She is not in acute distress.    Appearance: Normal appearance. She is not ill-appearing.  HENT:     Head: Normocephalic and atraumatic.     Nose: Nose normal.     Mouth/Throat:     Mouth: Mucous membranes are moist.  Eyes:     Extraocular Movements: Extraocular movements intact.     Conjunctiva/sclera: Conjunctivae normal.     Pupils: Pupils are equal, round, and reactive to light.  Cardiovascular:     Rate and Rhythm: Normal rate and regular rhythm.     Heart sounds: No murmur.  Pulmonary:     Breath sounds: Rales present. No wheezing or rhonchi.     Comments: Bibasilar rales, not new.  Abdominal:     General: Bowel sounds are normal. There is no distension.     Palpations: Abdomen is soft.     Tenderness: There is no  abdominal tenderness. There is no guarding or rebound.  Musculoskeletal:     Cervical back: Normal range of motion and neck supple.     Right lower leg: Edema present.     Left lower leg: Edema present.     Comments: Trace edema BLE  Skin:    General: Skin is warm  and dry.  Neurological:     General: No focal deficit present.     Mental Status: She is alert. Mental status is at baseline.     Motor: No weakness.     Gait: Gait abnormal.     Comments: Oriented to person, place.   Psychiatric:        Mood and Affect: Mood normal.        Behavior: Behavior normal.        Thought Content: Thought content normal.     Labs reviewed: Recent Labs    08/15/19 0000 12/24/19 0000  NA 137 141  K 4.4 4.0  CL  --  104  CO2  --  31*  BUN 38* 34*  CREATININE 1.1 1.0  CALCIUM  --  8.8   Recent Labs    08/15/19 0000 12/24/19 0000  AST 25 19  ALT 17 21  ALKPHOS 64 61  ALBUMIN  --  3.3*   Recent Labs    08/15/19 0000 12/24/19 0000  WBC 10.6 9.6  NEUTROABS  --  6,173  HGB 14.9 13.3  HCT 46 41  PLT 232 166   Lab Results  Component Value Date   TSH 2.72 08/15/2019   No results found for: HGBA1C No results found for: CHOL, HDL, LDLCALC, LDLDIRECT, TRIG, CHOLHDL  Significant Diagnostic Results in last 30 days:  No results found.  Assessment/Plan Edema of both lower legs due to peripheral venous insufficiency Chronic, remains no change, continue Furosemide.   COPD, mild ([place]) [place], continue DuoNeb bid.   Hypothyroidism Stable, continue Levothyroxine 151mg qd, TSH 2.72 08/15/19  Allergy Stable, continue Allegra.   Constipation Stable, continue Colace, prn MOM, Senokot S I qhs.      Family/ staff Communication: plan of care reviewed with the patient and charge nurse.   Labs/tests ordered:  none  Time spend 25 minutes.

## 2020-02-13 NOTE — Assessment & Plan Note (Signed)
Stable, continue Colace, prn MOM, Senokot S I qhs.

## 2020-02-13 NOTE — Assessment & Plan Note (Signed)
Chronic, remains no change, continue Furosemide.

## 2020-02-13 NOTE — Assessment & Plan Note (Signed)
Stable, continue Levothyroxine 150mcg qd, TSH 2.72 08/15/19 

## 2020-02-13 NOTE — Assessment & Plan Note (Signed)
Stable, continue Allegra.  

## 2020-02-18 ENCOUNTER — Encounter: Payer: Self-pay | Admitting: Nurse Practitioner

## 2020-02-18 ENCOUNTER — Non-Acute Institutional Stay (SKILLED_NURSING_FACILITY): Payer: Medicare PPO | Admitting: Nurse Practitioner

## 2020-02-18 DIAGNOSIS — R609 Edema, unspecified: Secondary | ICD-10-CM | POA: Diagnosis not present

## 2020-02-18 DIAGNOSIS — I872 Venous insufficiency (chronic) (peripheral): Secondary | ICD-10-CM

## 2020-02-18 DIAGNOSIS — R600 Localized edema: Secondary | ICD-10-CM

## 2020-02-18 DIAGNOSIS — E890 Postprocedural hypothyroidism: Secondary | ICD-10-CM

## 2020-02-18 NOTE — Progress Notes (Signed)
Location:   [place] Room Number: 28 Place of Service:  SNF (31) Provider:  Julieann Drummonds NP  [name], MD  Patient Care Team: [name], MD as PCP - General (Internal Medicine) [place] [place] Kyle Luppino X, NP as Nurse Practitioner (Nurse Practitioner)  Extended Emergency Contact Information Primary Emergency Contact: [name] [place] of [place] Phone: [phone] Relation: Son Secondary Emergency Contact: [place] Phone: [phone] Mobile Phone: [phone] Relation: Daughter  Code Status:  DNR Goals of care: Advanced Directive information Advanced Directives 02/18/2020  Does Patient Have a Medical Advance Directive? Yes  Type of Advance Directive Out of facility DNR (pink MOST or yellow form);Living will;Healthcare Power of Attorney  Does patient want to make changes to medical advance directive? No - Patient declined  Copy of [place] in Chart? Yes - validated most recent copy scanned in chart (See row information)  Would patient like information on creating a medical advance directive? -  Pre-existing out of facility DNR order (yellow form or pink MOST form) Yellow form placed in chart (order not valid for inpatient use);Pink MOST form placed in chart (order not valid for inpatient use)     Chief Complaint  Patient presents with  . Acute Visit    Increased swelling in legs    HPI:  Pt is a 86 y.o. female seen today for an acute visit for increased swelling in legs, some mild erythema lower shins, no open wound, slightly warm, tenderness as usual. Hx of PVD/sweelng in BLE, on Furosemide. Hx of peripheral neuropathy, sensitive BLE. Hypothyroidism, on Levothyroxine 115mg qd, stable.   Past Medical History:  Diagnosis Date  . Abnormal liver function tests    11/11/15 AST 31, ALT 88, alk phos 96   . Acute kidney failure, unspecified ([place] 01/24/2012    . Arthralgia 09/26/2014   Multiple joints: knees, shoulders, wrists, spine hips   . Arthritis   . Candidiasis of other urogenital sites 08/10/2012  . Cerebral embolism 05/23/2005  . CHF (congestive heart failure) ([place]   . Cholelithiasis 11/04/2015  . Closed fracture of sacrum and coccyx without mention of spinal cord injury 02/14/2012  . Constipation 05/03/2013  . Contusion of face, scalp, and neck except eye(s) 06/01/2012  . Contusion of wrist 06/01/2012  . COPD (chronic obstructive pulmonary disease) ([place]   . COPD, mild ([place] 01/20/2013  . Edema 04/20/2012  . H/O: CVA (cerebrovascular accident)   . Hearing loss 09/26/2014  . History of cancer of uterus   . HTN (hypertension), benign   . Hyperglycemia 11/14/2015  . Hypothyroidism   . Insomnia, unspecified 08/10/2012  . Open wound of knee, leg (except thigh), and ankle, without mention of complication 52/04/8526 . Other and unspecified hyperlipidemia 06/01/2012  . Other disorder of coccyx 01/27/2012  . Pain in joint, ankle and foot 06/14/2012  . Peripheral vascular disease, unspecified ([place] 01/27/2012  . Personal history of fall 01/27/2012  . Pneumonia, organism unspecified(486) 01/23/2005  . Rheumatic fever 09/26/1933   Age 6 10/01/14 ESR 13, RAF <10   . Seasonal allergies 05/03/2013  . Stasis dermatitis of both legs 09/26/2014  . Unspecified constipation 11/02/2012  . Unspecified hearing loss 02/08/2013  . Unspecified hereditary and idiopathic peripheral neuropathy 01/27/2012  . Urinary frequency 02/24/2014  . Ventricular fibrillation ([place] 01/24/2012   Past Surgical History:  Procedure Laterality Date  . ABDOMINAL HYSTERECTOMY  1990   for endometrial cancer  .  CATARACT EXTRACTION W/ INTRAOCULAR LENS  IMPLANT, BILATERAL    . [place]  . GUM SURGERY  1932  . MASTOIDECTOMY  1920   bilateral  . THYROIDECTOMY  1960  . TONSILLECTOMY  1916    Allergies  Allergen Reactions  . Amoxicillin     Unknown, patient unable  to answer questionnaire   . Aspirin Other (See Comments)    On MAR  . Avelox [Moxifloxacin]     Unknown: listed on MAR  . Erythromycin     Unknown: listed on MAR  . Monistat [Miconazole]     Unknown: listed on MAR  . Morphine And Related     Unknown: listed on MAR  . Orange Juice [Orange Oil]     Unknown: listed on MAR    Allergies as of 02/18/2020      Reactions   Amoxicillin    Unknown, patient unable to answer questionnaire    Aspirin Other (See Comments)   On MAR   Avelox [moxifloxacin]    Unknown: listed on MAR   Erythromycin    Unknown: listed on MAR   Monistat [miconazole]    Unknown: listed on MAR   Morphine And Related    Unknown: listed on MAR   Orange Juice [orange Oil]    Unknown: listed on [facility]      Medication List       Accurate as of February 18, 2020 11:59 PM. If you have any questions, ask your nurse or doctor.        STOP taking these medications   acetaminophen 325 MG tablet Commonly known as: TYLENOL Stopped by: Lynn Recendiz X Veronica Guerrant, NP   furosemide 20 MG tablet Commonly known as: LASIX Stopped by: Kinslei Labine X Darald Uzzle, NP     TAKE these medications   aluminum-magnesium hydroxide-simethicone 867-619-50 MG/5ML Susp Commonly known as: MAALOX Take 30 mLs by mouth every 6 (six) hours as needed.   aspirin 81 MG chewable tablet Chew 81 mg by mouth daily.   calcium citrate-vitamin D 315-200 MG-UNIT tablet Commonly known as: CITRACAL+D Take 1 tablet by mouth daily.   docusate sodium 100 MG capsule Commonly known as: COLACE Take 200 mg by mouth at bedtime.   fexofenadine 60 MG tablet Commonly known as: ALLEGRA Take 60 mg by mouth daily.   ipratropium-albuterol 0.5-2.5 (3) MG/3ML Soln Commonly known as: DUONEB Take 3 mLs by nebulization 2 (two) times daily.   levothyroxine 150 MCG tablet Commonly known as: SYNTHROID Take 150 mcg by mouth daily.   magnesium hydroxide 400 MG/5ML suspension Commonly known as: MILK OF MAGNESIA Take 30 mLs by mouth daily as  needed for mild constipation.   Melatonin 3 MG Tabs Take 3 mg by mouth at bedtime.   multivitamin with minerals Tabs tablet Take 1 tablet by mouth daily. Cerovite Senior   OcuSoft Lid Scrub Allergy Pads 1 WIPE PER EYE,CLEANSE BILATERAL LIDS TWICE DAILY FOR EXCESS EYE SECRETIONS   potassium chloride SA 20 MEQ tablet Commonly known as: KLOR-CON Take 20 mEq by mouth daily.   Refresh Tears 0.5 % Soln Generic drug: carboxymethylcellulose Place 1 drop into both eyes 2 (two) times daily.   sennosides-docusate sodium 8.6-50 MG tablet Commonly known as: SENOKOT-S Take 1 tablet by mouth at bedtime.   torsemide 20 MG tablet Commonly known as: DEMADEX Take 40 mg by mouth every other day.   torsemide 20 MG tablet Commonly known as: DEMADEX Take 20 mg by mouth every other day.   triamcinolone  cream 0.5 % Commonly known as: KENALOG Apply 1 application topically 2 (two) times daily.   zinc oxide 20 % ointment Apply 1 application topically as needed for irritation (To buttocks after every incontinent episode and for redness.).       Review of Systems  Constitutional: Negative for activity change, appetite change, fatigue, fever and unexpected weight change.  HENT: Positive for hearing loss. Negative for congestion and voice change.   Eyes: Negative for visual disturbance.  Respiratory: Positive for shortness of breath. Negative for cough and wheezing.        DOE  Cardiovascular: Positive for leg swelling. Negative for chest pain and palpitations.  Gastrointestinal: Negative for abdominal distention, abdominal pain, constipation and nausea.  Genitourinary: Negative for difficulty urinating, dysuria and urgency.  Musculoskeletal: Positive for arthralgias and gait problem.  Skin: Positive for color change and wound. Negative for pallor.       Mild erythema R+L lower shins.   Neurological: Negative for dizziness, speech difficulty, weakness and headaches.       Memory lapses.     Psychiatric/Behavioral: Negative for agitation, behavioral problems and sleep disturbance. The patient is not nervous/anxious.     Immunization History  Administered Date(s) Administered  . Influenza Whole 10/19/2012, 09/15/2018  . Influenza, High Dose Seasonal PF 09/14/2019  . Influenza-Unspecified 10/18/2013, 10/17/2014, 09/02/2015, 09/29/2016, 10/03/2017  . Moderna SARS-COVID-2 Vaccination 12/15/2019, 01/12/2020  . PPD Test 01/22/2013, 11/28/2015  . Pneumococcal Conjugate-13 11/18/2017  . Pneumococcal Polysaccharide-23 01/22/2013  . Td 02/21/2009  . Tdap 04/07/2015   Pertinent  Health Maintenance Due  Topic Date Due  . INFLUENZA VACCINE  Completed  . PNA vac Low Risk Adult  Completed   Fall Risk  09/05/2018 09/02/2017 11/14/2015 04/17/2015 09/26/2014  Falls in the past year? Yes No No Yes No  Number falls in past yr: 2 or more - - 1 -  Injury with Fall? Yes - - Yes -  Comment - - - laceration forehead 04/07/15 -  Risk Factor Category  - - - High Fall Risk -  Risk for fall due to : - - Impaired mobility;Impaired vision - -   Functional Status Survey:    Vitals:   02/18/20 1600  BP: 138/64  Pulse: 60  Resp: 20  Temp: (!) 97.2 F (36.2 C)  SpO2: 97%  Weight: 149 lb 12.8 oz (67.9 kg)  Height: 5' (1.524 m)   Body mass index is 29.26 kg/m. Physical Exam Vitals and nursing note reviewed.  Constitutional:      General: She is not in acute distress.    Appearance: Normal appearance. She is not ill-appearing.  HENT:     Head: Normocephalic and atraumatic.     Mouth/Throat:     Mouth: Mucous membranes are moist.  Eyes:     Extraocular Movements: Extraocular movements intact.     Conjunctiva/sclera: Conjunctivae normal.     Pupils: Pupils are equal, round, and reactive to light.  Cardiovascular:     Rate and Rhythm: Normal rate and regular rhythm.     Heart sounds: No murmur.  Pulmonary:     Breath sounds: Rales present. No wheezing.     Comments: Bibasilar rales, not  new.  Abdominal:     General: Bowel sounds are normal. There is no distension.     Palpations: Abdomen is soft.     Tenderness: There is no abdominal tenderness. There is no guarding.  Musculoskeletal:     Cervical back: Normal range of motion and  neck supple.     Right lower leg: Edema present.     Left lower leg: Edema present.     Comments: 2+  edema BLE  Skin:    General: Skin is warm and dry.     Findings: Erythema present.     Comments: Lower R+L shins redness and warmth. Sensitive to touch as usual.   Neurological:     General: No focal deficit present.     Mental Status: She is alert. Mental status is at baseline.     Motor: No weakness.     Gait: Gait abnormal.     Comments: Oriented to person, place.   Psychiatric:        Mood and Affect: Mood normal.        Behavior: Behavior normal.        Thought Content: Thought content normal.     Labs reviewed: Recent Labs    08/15/19 0000 12/24/19 0000  NA 137 141  K 4.4 4.0  CL  --  104  CO2  --  31*  BUN 38* 34*  CREATININE 1.1 1.0  CALCIUM  --  8.8   Recent Labs    08/15/19 0000 12/24/19 0000  AST 25 19  ALT 17 21  ALKPHOS 64 61  ALBUMIN  --  3.3*   Recent Labs    08/15/19 0000 12/24/19 0000  WBC 10.6 9.6  NEUTROABS  --  6,173  HGB 14.9 13.3  HCT 46 41  PLT 232 166   Lab Results  Component Value Date   TSH 2.72 08/15/2019   No results found for: HGBA1C No results found for: CHOL, HDL, LDLCALC, LDLDIRECT, TRIG, CHOLHDL  Significant Diagnostic Results in last 30 days:  No results found.  Assessment/Plan Edema of both lower legs due to peripheral venous insufficiency increased edema BLE, some erythematous areas lower shins, slightly warm, tenderness as usual, dc Furosemide, start Torsemide 27m qod, 419mqod, apply 0.5% Triamcinolone cream BLE qhs x 2 weeks. BMP one week.    Hypothyroidism Stable, continue Levothyroxine 15038mqd, TSH 2.72 08/15/19  Venous stasis dermatitis of both lower  extremities Better diuresis to reduce swelling BLE will help inflammation of the BLE, will  apply 0.5% Triamcinolone cream BLE qhs x 2 weeks, observe.      Family/ staff Communication: plan of care reviewed with the patient and charge nurse.   Labs/tests ordered:  BMP  Time spend 25 minutes.

## 2020-02-19 ENCOUNTER — Encounter: Payer: Self-pay | Admitting: Nurse Practitioner

## 2020-02-19 NOTE — Assessment & Plan Note (Signed)
Better diuresis to reduce swelling BLE will help inflammation of the BLE, will  apply 0.5% Triamcinolone cream BLE qhs x 2 weeks, observe.

## 2020-02-19 NOTE — Assessment & Plan Note (Signed)
Stable, continue Levothyroxine qd, TSH 2.72 08/15/19

## 2020-02-19 NOTE — Assessment & Plan Note (Signed)
increased edema BLE, some erythematous areas lower shins, slightly warm, tenderness as usual, dc Furosemide, start Torsemide 20mg  qod, 40mg  qod, apply 0.5% Triamcinolone cream BLE qhs x 2 weeks. BMP one week.

## 2020-02-20 DIAGNOSIS — Z20828 Contact with and (suspected) exposure to other viral communicable diseases: Secondary | ICD-10-CM | POA: Diagnosis not present

## 2020-02-26 DIAGNOSIS — I10 Essential (primary) hypertension: Secondary | ICD-10-CM | POA: Diagnosis not present

## 2020-02-27 DIAGNOSIS — Z20828 Contact with and (suspected) exposure to other viral communicable diseases: Secondary | ICD-10-CM | POA: Diagnosis not present

## 2020-02-27 LAB — BASIC METABOLIC PANEL
BUN: 33 — AB (ref 4–21)
CO2: 30 — AB (ref 13–22)
Chloride: 104 (ref 99–108)
Creatinine: 1.1 (ref 0.5–1.1)
Glucose: 112
Potassium: 3.8 (ref 3.4–5.3)
Sodium: 143 (ref 137–147)

## 2020-02-27 LAB — COMPREHENSIVE METABOLIC PANEL: Calcium: 8.7 (ref 8.7–10.7)

## 2020-03-10 ENCOUNTER — Encounter: Payer: Self-pay | Admitting: Nurse Practitioner

## 2020-03-10 ENCOUNTER — Non-Acute Institutional Stay (SKILLED_NURSING_FACILITY): Payer: Medicare PPO | Admitting: Nurse Practitioner

## 2020-03-10 DIAGNOSIS — I872 Venous insufficiency (chronic) (peripheral): Secondary | ICD-10-CM | POA: Diagnosis not present

## 2020-03-10 DIAGNOSIS — R2681 Unsteadiness on feet: Secondary | ICD-10-CM

## 2020-03-10 DIAGNOSIS — W19XXXA Unspecified fall, initial encounter: Secondary | ICD-10-CM

## 2020-03-10 NOTE — Assessment & Plan Note (Signed)
Recurrent blepharitis/conjunctivitis, Ocuflox 1 gtt qid OU x 4 weeks.

## 2020-03-10 NOTE — Assessment & Plan Note (Addendum)
Reported the patient's fall 03/08/20, resulted right knee abrasion when the patient was founding sitting on floor in her room, ambulated with assistance after getting off the floor, update CBC/diff, CMP/eGFR. Close supervision for safety due to the patient's poor safety awareness and frailty.

## 2020-03-10 NOTE — Assessment & Plan Note (Signed)
Needs supervision/assistance for transfer/ambulation, continue utilizing walker for ambulation.

## 2020-03-10 NOTE — Assessment & Plan Note (Signed)
Persisted edema BLE, continue Torsemide.

## 2020-03-10 NOTE — Progress Notes (Signed)
Location:   [place] Room Number: 28 Place of Service:  SNF (31) Provider: Lennie Odor Kylene Zamarron NP  [name], MD  Patient Care Team: [name], MD as PCP - General (Internal Medicine) [place], [place], [facility] Shaleigh Laubscher X, NP as Nurse Practitioner (Nurse Practitioner)  Extended Emergency Contact Information Primary Emergency Contact: [name] [place] of [place] Phone: [phone] Relation: Son Secondary Emergency Contact: [place] Phone: [phone] Mobile Phone: [phone] Relation: Daughter  Code Status: DNR Goals of care: Advanced Directive information Advanced Directives 03/10/2020  Does Patient Have a Medical Advance Directive? Yes  Type of Paramedic of [place];Living will;Out of facility DNR (pink MOST or yellow form)  Does patient want to make changes to medical advance directive? No - Patient declined  Copy of [place] in Chart? Yes - validated most recent copy scanned in chart (See row information)  Would patient like information on creating a medical advance directive? -  Pre-existing out of facility DNR order (yellow form or pink MOST form) Yellow form placed in chart (order not valid for inpatient use);Pink MOST form placed in chart (order not valid for inpatient use)     Chief Complaint  Patient presents with  . Acute Visit    Fall, Right knee abrasion    HPI:  Pt is a 86 y.o. female seen today for an acute visit for reported the patient's fall 03/08/20, resulted  right knee abrasion when the patient was founding sitting on floor in her room, ambulated with assistance after getting off the floor. The patient was noted to have redness in eyes, L>R, denied itching, pain, mild crusted eyelashes noted. Hx of Blepharitis, uses eylid cleansers. BLE edema, persisted, remains no significant changes, on Torsemide 82m and 27malternating.      Past Medical History:  Diagnosis Date  . Abnormal liver function tests    11/11/15 AST 31, ALT 88, alk phos 96   . Acute kidney failure, unspecified (HCRock[place]2/10/2012  . Arthralgia 09/26/2014   Multiple joints: knees, shoulders, wrists, spine hips   . Arthritis   . Candidiasis of other urogenital sites 08/10/2012  . Cerebral embolism 05/23/2005  . CHF (congestive heart failure) ([facility]  . Cholelithiasis 11/04/2015  . Closed fracture of sacrum and coccyx without mention of spinal cord injury 02/14/2012  . Constipation 05/03/2013  . Contusion of face, scalp, and neck except eye(s) 06/01/2012  . Contusion of wrist 06/01/2012  . COPD (chronic obstructive pulmonary disease) (HCM[place]  . COPD, mild (HCCimarron City2/07/2013  . Edema 04/20/2012  . H/O: CVA (cerebrovascular accident)   . Hearing loss 09/26/2014  . History of cancer of uterus   . HTN (hypertension), benign   . Hyperglycemia 11/14/2015  . Hypothyroidism   . Insomnia, unspecified 08/10/2012  . Open wound of knee, leg (except thigh), and ankle, without mention of complication 5/04/18/4331. Other and unspecified hyperlipidemia 06/01/2012  . Other disorder of coccyx 01/27/2012  . Pain in joint, ankle and foot 06/14/2012  . Peripheral vascular disease, unspecified (HCCape Carteret2/14/2013  . Personal history of fall 01/27/2012  . Pneumonia, organism unspecified(486) 01/23/2005  . Rheumatic fever 09/26/1933   Age 52 10/01/14 ESR 13, RAF <10   . Seasonal allergies 05/03/2013  . Stasis dermatitis of both legs 09/26/2014  . Unspecified constipation 11/02/2012  . Unspecified hearing loss 02/08/2013  . Unspecified hereditary and idiopathic peripheral neuropathy 01/27/2012  . Urinary frequency 02/24/2014  .  Ventricular fibrillation (HCC) 01/24/2012   Past Surgical History:  Procedure Laterality Date  . ABDOMINAL HYSTERECTOMY  1990   for endometrial cancer  . CATARACT EXTRACTION W/ INTRAOCULAR LENS  IMPLANT, BILATERAL    . ECTOPIC PREGNANCY SURGERY  1952  . GUM  SURGERY  1932  . MASTOIDECTOMY  1920   bilateral  . THYROIDECTOMY  1960  . TONSILLECTOMY  1916    Allergies  Allergen Reactions  . Amoxicillin     Unknown, patient unable to answer questionnaire   . Aspirin Other (See Comments)    On MAR  . Avelox [Moxifloxacin]     Unknown: listed on MAR  . Erythromycin     Unknown: listed on MAR  . Monistat [Miconazole]     Unknown: listed on MAR  . Morphine And Related     Unknown: listed on MAR  . Orange Juice [Orange Oil]     Unknown: listed on MAR    Allergies as of 03/10/2020      Reactions   Amoxicillin    Unknown, patient unable to answer questionnaire    Aspirin Other (See Comments)   On MAR   Avelox [moxifloxacin]    Unknown: listed on MAR   Erythromycin    Unknown: listed on MAR   Monistat [miconazole]    Unknown: listed on MAR   Morphine And Related    Unknown: listed on MAR   Orange Juice [orange Oil]    Unknown: listed on MAR      Medication List       Accurate as of March 10, 2020  1:47 PM. If you have any questions, ask your nurse or doctor.        STOP taking these medications   triamcinolone cream 0.5 % Commonly known as: KENALOG Stopped by:  X , NP     TAKE these medications   aluminum-magnesium hydroxide-simethicone 200-200-20 MG/5ML Susp Commonly known as: MAALOX Take 30 mLs by mouth every 6 (six) hours as needed.   aspirin 81 MG chewable tablet Chew 81 mg by mouth daily.   calcium citrate-vitamin D 315-200 MG-UNIT tablet Commonly known as: CITRACAL+D Take 1 tablet by mouth daily.   docusate sodium 100 MG capsule Commonly known as: COLACE Take 200 mg by mouth at bedtime.   fexofenadine 60 MG tablet Commonly known as: ALLEGRA Take 60 mg by mouth daily.   ipratropium-albuterol 0.5-2.5 (3) MG/3ML Soln Commonly known as: DUONEB Take 3 mLs by nebulization 2 (two) times daily.   levothyroxine 150 MCG tablet Commonly known as: SYNTHROID Take 150 mcg by mouth daily.   magnesium  hydroxide 400 MG/5ML suspension Commonly known as: MILK OF MAGNESIA Take 30 mLs by mouth daily as needed for mild constipation.   melatonin 3 MG Tabs tablet Take 3 mg by mouth at bedtime.   multivitamin with minerals Tabs tablet Take 1 tablet by mouth daily. Cerovite Senior   OcuSoft Lid Scrub Allergy Pads 1 WIPE PER EYE,CLEANSE BILATERAL LIDS TWICE DAILY FOR EXCESS EYE SECRETIONS   potassium chloride SA 20 MEQ tablet Commonly known as: KLOR-CON Take 20 mEq by mouth daily.   Refresh Tears 0.5 % Soln Generic drug: carboxymethylcellulose Place 1 drop into both eyes 2 (two) times daily.   sennosides-docusate sodium 8.6-50 MG tablet Commonly known as: SENOKOT-S Take 1 tablet by mouth at bedtime.   torsemide 20 MG tablet Commonly known as: DEMADEX Take 40 mg by mouth every other day.   torsemide 20 MG tablet Commonly known as:   DEMADEX Take 20 mg by mouth every other day.   zinc oxide 20 % ointment Apply 1 application topically as needed for irritation (To buttocks after every incontinent episode and for redness.).      ROS was provided with assistance of staff.  Review of Systems  Constitutional: Negative for activity change, appetite change, fatigue and fever.  HENT: Positive for hearing loss. Negative for congestion and voice change.   Eyes: Positive for discharge and redness. Negative for visual disturbance.  Respiratory: Positive for shortness of breath. Negative for cough and wheezing.        DOE  Cardiovascular: Positive for leg swelling. Negative for chest pain and palpitations.  Gastrointestinal: Negative for abdominal distention, abdominal pain and constipation.  Genitourinary: Negative for difficulty urinating, dysuria and urgency.  Musculoskeletal: Positive for arthralgias and gait problem.  Skin: Positive for color change and wound. Negative for pallor.       Mild erythema R+L lower shins. Right knee abrasion  Neurological: Negative for dizziness, syncope,  speech difficulty, weakness and headaches.       Memory lapses.   Psychiatric/Behavioral: Negative for agitation, behavioral problems and sleep disturbance. The patient is not nervous/anxious.     Immunization History  Administered Date(s) Administered  . Influenza Whole 10/19/2012, 09/15/2018  . Influenza, High Dose Seasonal PF 09/14/2019  . Influenza-Unspecified 10/18/2013, 10/17/2014, 09/02/2015, 09/29/2016, 10/03/2017  . Moderna SARS-COVID-2 Vaccination 12/15/2019, 01/12/2020  . PPD Test 01/22/2013, 11/28/2015  . Pneumococcal Conjugate-13 11/18/2017  . Pneumococcal Polysaccharide-23 01/22/2013  . Td 02/21/2009  . Tdap 04/07/2015   Pertinent  Health Maintenance Due  Topic Date Due  . INFLUENZA VACCINE  Completed  . PNA vac Low Risk Adult  Completed   Fall Risk  09/05/2018 09/02/2017 11/14/2015 04/17/2015 09/26/2014  Falls in the past year? Yes No No Yes No  Number falls in past yr: 2 or more - - 1 -  Injury with Fall? Yes - - Yes -  Comment - - - laceration forehead 04/07/15 -  Risk Factor Category  - - - High Fall Risk -  Risk for fall due to : - - Impaired mobility;Impaired vision - -   Functional Status Survey:    Vitals:   03/10/20 1105  BP: 130/80  Pulse: 76  Resp: 20  Temp: (!) 96.6 F (35.9 C)  SpO2: 95%  Weight: 149 lb 12.8 oz (67.9 kg)  Height: 5' (1.524 m)   Body mass index is 29.26 kg/m. Physical Exam Vitals and nursing note reviewed.  Constitutional:      General: She is not in acute distress.    Appearance: Normal appearance. She is not ill-appearing.  HENT:     Head: Normocephalic and atraumatic.     Nose: Nose normal.     Mouth/Throat:     Mouth: Mucous membranes are moist.  Eyes:     Extraocular Movements: Extraocular movements intact.     Pupils: Pupils are equal, round, and reactive to light.     Comments: Injected conjunctivae L>R, slightly crusted eyelashes.   Cardiovascular:     Rate and Rhythm: Normal rate and regular rhythm.     Heart  sounds: No murmur.  Pulmonary:     Breath sounds: Rales present.     Comments: Bibasilar rales, not new.  Abdominal:     General: Bowel sounds are normal. There is no distension.     Palpations: Abdomen is soft.     Tenderness: There is no abdominal tenderness.  Musculoskeletal:       Cervical back: Normal range of motion and neck supple.     Right lower leg: Edema present.     Left lower leg: Edema present.     Comments: 1-2+  edema BLE  Skin:    General: Skin is warm and dry.     Findings: Erythema present.     Comments: Lower R+L shins redness and warmth. Sensitive to touch as usual. Right knee abrasion, 3 dime sized areas, no s/s of infection.   Neurological:     General: No focal deficit present.     Mental Status: She is alert. Mental status is at baseline.     Motor: No weakness.     Gait: Gait abnormal.     Comments: Oriented to person, place.   Psychiatric:        Mood and Affect: Mood normal.        Behavior: Behavior normal.     Labs reviewed: Recent Labs    08/15/19 0000 12/24/19 0000 02/27/20 0000  NA 137 141 143  K 4.4 4.0 3.8  CL  --  104 104  CO2  --  31* 30*  BUN 38* 34* 33*  CREATININE 1.1 1.0 1.1  CALCIUM  --  8.8 8.7   Recent Labs    08/15/19 0000 12/24/19 0000  AST 25 19  ALT 17 21  ALKPHOS 64 61  ALBUMIN  --  3.3*   Recent Labs    08/15/19 0000 12/24/19 0000  WBC 10.6 9.6  NEUTROABS  --  6,173  HGB 14.9 13.3  HCT 46 41  PLT 232 166   Lab Results  Component Value Date   TSH 2.72 08/15/2019   No results found for: HGBA1C No results found for: CHOL, HDL, LDLCALC, LDLDIRECT, TRIG, CHOLHDL  Significant Diagnostic Results in last 30 days:  No results found.  Assessment/Plan: Fall Reported the patient's fall 03/08/20, resulted right knee abrasion when the patient was founding sitting on floor in her room, ambulated with assistance after getting off the floor, update CBC/diff, CMP/eGFR. Close supervision for safety due to the  patient's poor safety awareness and frailty.   Blepharitis of both eyes Recurrent blepharitis/conjunctivitis, Ocuflox 1 gtt qid OU x 4 weeks.   Gait instability Needs supervision/assistance for transfer/ambulation, continue utilizing walker for ambulation.   Venous stasis dermatitis of both lower extremities Persisted edema BLE, continue Torsemide.     Family/ staff Communication: plan of care reviewed with the patient and charge nurse.   Labs/tests ordered: CBC/diff, CMP/eGFR  Time spend 25 minutes.  

## 2020-03-11 DIAGNOSIS — I10 Essential (primary) hypertension: Secondary | ICD-10-CM | POA: Diagnosis not present

## 2020-03-11 LAB — CBC AND DIFFERENTIAL
HCT: 40 (ref 36–46)
Hemoglobin: 13.2 (ref 12.0–16.0)
Neutrophils Absolute: 4312
Platelets: 158 (ref 150–399)
WBC: 7.7

## 2020-03-11 LAB — BASIC METABOLIC PANEL
BUN: 36 — AB (ref 4–21)
CO2: 30 — AB (ref 13–22)
Chloride: 99 (ref 99–108)
Creatinine: 1.1 (ref 0.5–1.1)
Glucose: 104
Potassium: 4.2 (ref 3.4–5.3)
Sodium: 137 (ref 137–147)

## 2020-03-11 LAB — HEPATIC FUNCTION PANEL
ALT: 16 (ref 7–35)
AST: 20 (ref 13–35)
Alkaline Phosphatase: 63 (ref 25–125)
Bilirubin, Total: 0.4

## 2020-03-11 LAB — CBC: RBC: 4.52 (ref 3.87–5.11)

## 2020-03-11 LAB — COMPREHENSIVE METABOLIC PANEL
Albumin: 3.3 — AB (ref 3.5–5.0)
Calcium: 8.6 — AB (ref 8.7–10.7)
Globulin: 2.6

## 2020-04-02 ENCOUNTER — Non-Acute Institutional Stay (SKILLED_NURSING_FACILITY): Payer: Medicare PPO | Admitting: Nurse Practitioner

## 2020-04-02 ENCOUNTER — Encounter: Payer: Self-pay | Admitting: Nurse Practitioner

## 2020-04-02 DIAGNOSIS — J449 Chronic obstructive pulmonary disease, unspecified: Secondary | ICD-10-CM

## 2020-04-02 DIAGNOSIS — K5900 Constipation, unspecified: Secondary | ICD-10-CM

## 2020-04-02 DIAGNOSIS — E890 Postprocedural hypothyroidism: Secondary | ICD-10-CM | POA: Diagnosis not present

## 2020-04-02 DIAGNOSIS — R609 Edema, unspecified: Secondary | ICD-10-CM

## 2020-04-02 DIAGNOSIS — I872 Venous insufficiency (chronic) (peripheral): Secondary | ICD-10-CM

## 2020-04-02 DIAGNOSIS — R2681 Unsteadiness on feet: Secondary | ICD-10-CM | POA: Diagnosis not present

## 2020-04-02 NOTE — Assessment & Plan Note (Signed)
Wobbly, unsteady, walks with walker between bathroom and her recliner, w/c to go further.

## 2020-04-02 NOTE — Progress Notes (Signed)
Location:   [place] Room Number: 28 Place of Service:  SNF (31) Provider: Marda Stalker, Lennie Odor NP  [name], MD  Patient Care Team: [name], MD as PCP - General (Internal Medicine) [place], [place], [facility] Maddisen Vought X, NP as Nurse Practitioner (Nurse Practitioner)  Extended Emergency Contact Information Primary Emergency Contact: [name] [place] of [place] Phone: [phone] Relation: Son Secondary Emergency Contact: [place] Phone: [phone] Mobile Phone: [phone] Relation: Daughter  Code Status:  DNR Goals of care: Advanced Directive information Advanced Directives 04/02/2020  Does Patient Have a Medical Advance Directive? Yes  Type of Advance Directive Living will;Healthcare Power of [place];Out of facility DNR (pink MOST or yellow form)  Does patient want to make changes to medical advance directive? No - Patient declined  Copy of [place] in Chart? Yes - validated most recent copy scanned in chart (See row information)  Would patient like information on creating a medical advance directive? -  Pre-existing out of facility DNR order (yellow form or pink MOST form) Yellow form placed in chart (order not valid for inpatient use);Pink MOST form placed in chart (order not valid for inpatient use)     Chief Complaint  Patient presents with  . Medical Management of Chronic Issues    HPI:  Pt is a 86 y.o. female seen today for medical management of chronic diseases.  The [facility] in [facility] for safety, care assistance, w/c for mobility, ambulates with walker between her recliner to bathroom. Chronic edema BLE, on Torsemide 26m qod, 260mqod. Constipation,stable, on Senokot S qd, Colace 20073md,  MOM prn. Hypothyroidism, stable, on Levothyroxine 150m17md. COPD, stable, on DuoNeb bid.    Past Medical History:  Diagnosis Date  . Abnormal liver  function tests    11/11/15 AST 31, ALT 88, alk phos 96   . Acute kidney failure, unspecified (HCC)[place]11/2013  . Arthralgia 09/26/2014   Multiple joints: knees, shoulders, wrists, spine hips   . Arthritis   . Candidiasis of other urogenital sites 08/10/2012  . Cerebral embolism 05/23/2005  . CHF (congestive heart failure) (HCC)[place]. Cholelithiasis 11/04/2015  . Closed fracture of sacrum and coccyx without mention of spinal cord injury 02/14/2012  . Constipation 05/03/2013  . Contusion of face, scalp, and neck except eye(s) 06/01/2012  . Contusion of wrist 06/01/2012  . COPD (chronic obstructive pulmonary disease) (HCC)[place]. COPD, mild (HCC)[place]8/2014  . Edema 04/20/2012  . H/O: CVA (cerebrovascular accident)   . Hearing loss 09/26/2014  . History of cancer of uterus   . HTN (hypertension), benign   . Hyperglycemia 11/14/2015  . Hypothyroidism   . Insomnia, unspecified 08/10/2012  . Open wound of knee, leg (except thigh), and ankle, without mention of complication 5/9/9/3/7902Other and unspecified hyperlipidemia 06/01/2012  . Other disorder of coccyx 01/27/2012  . Pain in joint, ankle and foot 06/14/2012  . Peripheral vascular disease, unspecified (HCC)[place]14/2013  . Personal history of fall 01/27/2012  . Pneumonia, organism unspecified(486) 01/23/2005  . Rheumatic fever 09/26/1933   Age 68 10/01/14 ESR 13, RAF <10   . Seasonal allergies 05/03/2013  . Stasis dermatitis of both legs 09/26/2014  . Unspecified constipation 11/02/2012  . Unspecified hearing loss 02/08/2013  . Unspecified hereditary and idiopathic peripheral neuropathy 01/27/2012  . Urinary frequency 02/24/2014  . Ventricular fibrillation (HCC)[place]11/2013   Past Surgical History:  Procedure Laterality Date  .  ABDOMINAL HYSTERECTOMY  1990   for endometrial cancer  . CATARACT EXTRACTION W/ INTRAOCULAR LENS  IMPLANT, BILATERAL    . [place]  . GUM SURGERY  1932  . MASTOIDECTOMY  1920   bilateral  . THYROIDECTOMY   1960  . TONSILLECTOMY  1916    Allergies  Allergen Reactions  . Amoxicillin     Unknown, patient unable to answer questionnaire   . Aspirin Other (See Comments)    On MAR  . Avelox [Moxifloxacin]     Unknown: listed on MAR  . Erythromycin     Unknown: listed on MAR  . Monistat [Miconazole]     Unknown: listed on MAR  . Morphine And Related     Unknown: listed on MAR  . Orange Juice [Orange Oil]     Unknown: listed on MAR    Allergies as of 04/02/2020      Reactions   Amoxicillin    Unknown, patient unable to answer questionnaire    Aspirin Other (See Comments)   On MAR   Avelox [moxifloxacin]    Unknown: listed on MAR   Erythromycin    Unknown: listed on MAR   Monistat [miconazole]    Unknown: listed on MAR   Morphine And Related    Unknown: listed on MAR   Orange Juice [orange Oil]    Unknown: listed on [facility]      Medication List       Accurate as of April 02, 2020 11:59 PM. If you have any questions, ask your nurse or doctor.        aluminum-magnesium hydroxide-simethicone 607-371-06 MG/5ML Susp Commonly known as: MAALOX Take 30 mLs by mouth every 6 (six) hours as needed.   aspirin 81 MG chewable tablet Chew 81 mg by mouth daily.   calcium citrate-vitamin D 315-200 MG-UNIT tablet Commonly known as: CITRACAL+D Take 1 tablet by mouth daily.   docusate sodium 100 MG capsule Commonly known as: COLACE Take 200 mg by mouth at bedtime.   fexofenadine 60 MG tablet Commonly known as: ALLEGRA Take 60 mg by mouth daily.   ipratropium-albuterol 0.5-2.5 (3) MG/3ML Soln Commonly known as: DUONEB Take 3 mLs by nebulization 2 (two) times daily.   levothyroxine 150 MCG tablet Commonly known as: SYNTHROID Take 150 mcg by mouth daily.   magnesium hydroxide 400 MG/5ML suspension Commonly known as: MILK OF MAGNESIA Take 30 mLs by mouth daily as needed for mild constipation.   melatonin 3 MG Tabs tablet Take 3 mg by mouth at bedtime.   multivitamin with  minerals Tabs tablet Take 1 tablet by mouth daily. Cerovite Senior   OcuSoft Lid Scrub Allergy Pads 1 WIPE PER EYE,CLEANSE BILATERAL LIDS TWICE DAILY FOR EXCESS EYE SECRETIONS   ofloxacin 0.3 % ophthalmic solution Commonly known as: OCUFLOX 1 drop 4 (four) times daily.   potassium chloride SA 20 MEQ tablet Commonly known as: KLOR-CON Take 20 mEq by mouth daily.   Refresh Tears 0.5 % Soln Generic drug: carboxymethylcellulose Place 1 drop into both eyes 2 (two) times daily.   sennosides-docusate sodium 8.6-50 MG tablet Commonly known as: SENOKOT-S Take 1 tablet by mouth at bedtime.   torsemide 20 MG tablet Commonly known as: DEMADEX Take 40 mg by mouth every other day.   torsemide 20 MG tablet Commonly known as: DEMADEX Take 20 mg by mouth every other day.   zinc oxide 20 % ointment Apply 1 application topically as needed for irritation (To buttocks after every  incontinent episode and for redness.).       Review of Systems  Constitutional: Negative for activity change, appetite change, fatigue, fever and unexpected weight change.  HENT: Positive for hearing loss. Negative for congestion and voice change.   Eyes: Negative for visual disturbance.  Respiratory: Positive for shortness of breath. Negative for cough and wheezing.        DOE  Cardiovascular: Positive for leg swelling.  Gastrointestinal: Negative for abdominal pain and constipation.  Genitourinary: Negative for difficulty urinating, dysuria and urgency.  Musculoskeletal: Positive for arthralgias and gait problem.  Skin: Negative for color change.  Neurological: Negative for speech difficulty, weakness and light-headedness.       Memory lapses.   Psychiatric/Behavioral: Negative for agitation, behavioral problems and sleep disturbance.    Immunization History  Administered Date(s) Administered  . Influenza Whole 10/19/2012, 09/15/2018  . Influenza, High Dose Seasonal PF 09/14/2019  . Influenza-Unspecified  10/18/2013, 10/17/2014, 09/02/2015, 09/29/2016, 10/03/2017  . Moderna SARS-COVID-2 Vaccination 12/15/2019, 01/12/2020  . PPD Test 01/22/2013, 11/28/2015  . Pneumococcal Conjugate-13 11/18/2017  . Pneumococcal Polysaccharide-23 01/22/2013  . Td 02/21/2009  . Tdap 04/07/2015   Pertinent  Health Maintenance Due  Topic Date Due  . INFLUENZA VACCINE  07/13/2020  . PNA vac Low Risk Adult  Completed   Fall Risk  09/05/2018 09/02/2017 11/14/2015 04/17/2015 09/26/2014  Falls in the past year? Yes No No Yes No  Number falls in past yr: 2 or more - - 1 -  Injury with Fall? Yes - - Yes -  Comment - - - laceration forehead 04/07/15 -  Risk Factor Category  - - - High Fall Risk -  Risk for fall due to : - - Impaired mobility;Impaired vision - -   Functional Status Survey:    Vitals:   04/02/20 0832  BP: 118/64  Pulse: 70  Resp: 18  Temp: 98.2 F (36.8 C)  SpO2: 93%  Weight: 144 lb (65.3 kg)  Height: 5' (1.524 m)   Body mass index is 28.12 kg/m. Physical Exam Vitals and nursing note reviewed.  Constitutional:      Appearance: Normal appearance.     Comments: Over weight  HENT:     Head: Normocephalic and atraumatic.     Nose: Nose normal.     Mouth/Throat:     Mouth: Mucous membranes are moist.  Eyes:     Extraocular Movements: Extraocular movements intact.     Pupils: Pupils are equal, round, and reactive to light.  Cardiovascular:     Rate and Rhythm: Normal rate and regular rhythm.     Heart sounds: No murmur.  Pulmonary:     Breath sounds: Rales present.     Comments: Bibasilar rales chronic Abdominal:     General: Bowel sounds are normal. There is no distension.     Palpations: Abdomen is soft.     Tenderness: There is no abdominal tenderness.  Musculoskeletal:     Cervical back: Normal range of motion and neck supple.     Right lower leg: Edema present.     Left lower leg: Edema present.     Comments: 1 + edema BLE  Skin:    General: Skin is warm and dry.      Comments: BLE sensitive to touch as usual  Neurological:     General: No focal deficit present.     Mental Status: She is alert. Mental status is at baseline.     Motor: No weakness.  Gait: Gait abnormal.     Comments: Oriented to person, place.   Psychiatric:        Mood and Affect: Mood normal.        Behavior: Behavior normal.     Labs reviewed: Recent Labs    12/24/19 0000 02/27/20 0000 03/11/20 0000  NA 141 143 137  K 4.0 3.8 4.2  CL 104 104 99  CO2 31* 30* 30*  BUN 34* 33* 36*  CREATININE 1.0 1.1 1.1  CALCIUM 8.8 8.7 8.6*   Recent Labs    08/15/19 0000 12/24/19 0000 03/11/20 0000  AST _0 ALT _1 ALKPHOS 64 61 63  ALBUMIN  --  3.3* 3.3*   Recent Labs    08/15/19 0000 12/24/19 0000 03/11/20 0000  WBC 10.6 9.6 7.7  NEUTROABS  --  6,173 4,312  HGB 14.9 13.3 13.2  HCT 46 41 40  PLT 232 166 158   Lab Results  Component Value Date   TSH 2.72 08/15/2019   No results found for: HGBA1C No results found for: CHOL, HDL, LDLCALC, LDLDIRECT, TRIG, CHOLHDL  Significant Diagnostic Results in last 30 days:  No results found.  Assessment/Plan Edema of both lower legs due to peripheral venous insufficiency Chronic, continue Torsemide.   COPD, mild ([place]) Stable, continue DuoNeb.   Hypothyroidism Stable, TSH 2.72 08/15/19, continue Levothyroxine.   Constipation Stable, contieu Senokot S, Colace, MOM  Gait instability Wobbly, unsteady, walks with walker between bathroom and her recliner, w/c to go further.      Family/ staff Communication: plan of care reviewed with the patient and charge nurse.   Labs/tests ordered:  none  Time spend 25 minutes.

## 2020-04-02 NOTE — Assessment & Plan Note (Signed)
Chronic, continue Torsemide.  

## 2020-04-02 NOTE — Assessment & Plan Note (Signed)
Stable, continue DuoNeb.

## 2020-04-02 NOTE — Assessment & Plan Note (Signed)
Stable, contieu Senokot S, Colace, MOM

## 2020-04-02 NOTE — Assessment & Plan Note (Signed)
Stable, TSH 2.72 08/15/19, continue Levothyroxine.  

## 2020-04-03 ENCOUNTER — Encounter: Payer: Self-pay | Admitting: Nurse Practitioner

## 2020-04-07 ENCOUNTER — Encounter: Payer: Self-pay | Admitting: Nurse Practitioner

## 2020-04-07 ENCOUNTER — Non-Acute Institutional Stay (SKILLED_NURSING_FACILITY): Payer: Medicare PPO | Admitting: Nurse Practitioner

## 2020-04-07 DIAGNOSIS — I872 Venous insufficiency (chronic) (peripheral): Secondary | ICD-10-CM | POA: Diagnosis not present

## 2020-04-07 DIAGNOSIS — K5900 Constipation, unspecified: Secondary | ICD-10-CM | POA: Diagnosis not present

## 2020-04-07 DIAGNOSIS — K120 Recurrent oral aphthae: Secondary | ICD-10-CM

## 2020-04-07 DIAGNOSIS — R609 Edema, unspecified: Secondary | ICD-10-CM | POA: Diagnosis not present

## 2020-04-07 DIAGNOSIS — R600 Localized edema: Secondary | ICD-10-CM

## 2020-04-07 NOTE — Progress Notes (Signed)
Location:   [facility] Room Number: 28 Place of Service:  SNF (31) Provider: Xie  NP  Gupta, Anjali L, MD  Patient Care Team: Gupta, Anjali L, MD as PCP - General (Internal Medicine) [place]  X, NP as Nurse Practitioner (Nurse Practitioner)  Extended Emergency Contact Information Primary Emergency Contact: Grime,Samuel  [place] Home Phone: [phone] Relation: Son Secondary Emergency Contact: Graf-Haag,Jean Home Phone: [phone] Mobile Phone: [phone] Relation: Daughter  Code Status: DNR Goals of care: Advanced Directive information Advanced Directives 04/07/2020  Does Patient Have a Medical Advance Directive? Yes  Type of Advance Directive Out of facility DNR (pink MOST or yellow form);Healthcare Power of Attorney;Living will  Does patient want to make changes to medical advance directive? No - Patient declined  Copy of Healthcare Power of Attorney in Chart? Yes - validated most recent copy scanned in chart (See row information)  Would patient like information on creating a medical advance directive? -  Pre-existing out of facility DNR order (yellow form or pink MOST form) Yellow form placed in chart (order not valid for inpatient use);Pink MOST form placed in chart (order not valid for inpatient use)     Chief Complaint  Patient presents with  . Acute Visit    Ulcer inside mouth    HPI:  Pt is a 86 y.o. female seen today for an acute visit for the left lower,  sallow sore inside of mouth lower lip ulcerated lesion, no bleeding, onset and duration are uncertain. Also staff reported constipation, on Senokot S I 1d, Colace qd, prn MOM. Chronic edema BLE, on Torsemide 40mg and 20mg qod.    Past Medical History:  Diagnosis Date  . Abnormal liver function tests    11/11/15 AST 31, ALT 88, alk phos 96   . Acute kidney failure, unspecified (HCC) 01/24/2012  .  Arthralgia 09/26/2014   Multiple joints: knees, shoulders, wrists, spine hips   . Arthritis   . Candidiasis of other urogenital sites 08/10/2012  . Cerebral embolism 05/23/2005  . CHF (congestive heart failure) (HCC)   . Cholelithiasis 11/04/2015  . Closed fracture of sacrum and coccyx without mention of spinal cord injury 02/14/2012  . Constipation 05/03/2013  . Contusion of face, scalp, and neck except eye(s) 06/01/2012  . Contusion of wrist 06/01/2012  . COPD (chronic obstructive pulmonary disease) (HCC)   . COPD, mild (HCC) 01/20/2013  . Edema 04/20/2012  . H/O: CVA (cerebrovascular accident)   . Hearing loss 09/26/2014  . History of cancer of uterus   . HTN (hypertension), benign   . Hyperglycemia 11/14/2015  . Hypothyroidism   . Insomnia, unspecified 08/10/2012  . Open wound of knee, leg (except thigh), and ankle, without mention of complication 04/20/2012  . Other and unspecified hyperlipidemia 06/01/2012  . Other disorder of coccyx 01/27/2012  . Pain in joint, ankle and foot 06/14/2012  . Peripheral vascular disease, unspecified (HCC) 01/27/2012  . Personal history of fall 01/27/2012  . Pneumonia, organism unspecified(486) 01/23/2005  . Rheumatic fever 09/26/1933   Age 22 10/01/14 ESR 13, RAF <10   . Seasonal allergies 05/03/2013  . Stasis dermatitis of both legs 09/26/2014  . Unspecified constipation 11/02/2012  . Unspecified hearing loss 02/08/2013  . Unspecified hereditary and idiopathic peripheral neuropathy 01/27/2012  . Urinary frequency 02/24/2014  . Ventricular fibrillation (HCC) 01/24/2012   Past Surgical History:  Procedure Laterality Date  . ABDOMINAL HYSTERECTOMY  1990   for endometrial   cancer  . CATARACT EXTRACTION W/ INTRAOCULAR LENS  IMPLANT, BILATERAL    . ECTOPIC PREGNANCY SURGERY  1952  . GUM SURGERY  1932  . MASTOIDECTOMY  1920   bilateral  . THYROIDECTOMY  1960  . TONSILLECTOMY  1916    Allergies  Allergen Reactions  . Amoxicillin     Unknown, patient unable to  answer questionnaire   . Aspirin Other (See Comments)    On MAR  . Avelox [Moxifloxacin]     Unknown: listed on MAR  . Erythromycin     Unknown: listed on MAR  . Monistat [Miconazole]     Unknown: listed on MAR  . Morphine And Related     Unknown: listed on MAR  . Orange Juice [Orange Oil]     Unknown: listed on MAR    Allergies as of 04/07/2020      Reactions   Amoxicillin    Unknown, patient unable to answer questionnaire    Aspirin Other (See Comments)   On MAR   Avelox [moxifloxacin]    Unknown: listed on MAR   Erythromycin    Unknown: listed on MAR   Monistat [miconazole]    Unknown: listed on MAR   Morphine And Related    Unknown: listed on MAR   Orange Juice [orange Oil]    Unknown: listed on MAR      Medication List       Accurate as of April 07, 2020 11:59 PM. If you have any questions, ask your nurse or doctor.        acetaminophen 325 MG tablet Commonly known as: TYLENOL Take 650 mg by mouth every 4 (four) hours as needed.   aluminum-magnesium hydroxide-simethicone 200-200-20 MG/5ML Susp Commonly known as: MAALOX Take 30 mLs by mouth every 6 (six) hours as needed.   aspirin 81 MG chewable tablet Chew 81 mg by mouth daily.   calcium citrate-vitamin D 315-200 MG-UNIT tablet Commonly known as: CITRACAL+D Take 1 tablet by mouth daily.   docusate sodium 100 MG capsule Commonly known as: COLACE Take 200 mg by mouth at bedtime.   fexofenadine 60 MG tablet Commonly known as: ALLEGRA Take 60 mg by mouth daily.   ipratropium-albuterol 0.5-2.5 (3) MG/3ML Soln Commonly known as: DUONEB Take 3 mLs by nebulization 2 (two) times daily.   levothyroxine 150 MCG tablet Commonly known as: SYNTHROID Take 150 mcg by mouth daily.   magnesium hydroxide 400 MG/5ML suspension Commonly known as: MILK OF MAGNESIA Take 30 mLs by mouth daily as needed for mild constipation.   melatonin 3 MG Tabs tablet Take 3 mg by mouth at bedtime.   multivitamin with  minerals Tabs tablet Take 1 tablet by mouth daily. Cerovite Senior   OcuSoft Lid Scrub Allergy Pads 1 WIPE PER EYE,CLEANSE BILATERAL LIDS TWICE DAILY FOR EXCESS EYE SECRETIONS   ofloxacin 0.3 % ophthalmic solution Commonly known as: OCUFLOX 1 drop 4 (four) times daily.   potassium chloride SA 20 MEQ tablet Commonly known as: KLOR-CON Take 20 mEq by mouth daily.   Refresh Tears 0.5 % Soln Generic drug: carboxymethylcellulose Place 1 drop into both eyes 2 (two) times daily.   sennosides-docusate sodium 8.6-50 MG tablet Commonly known as: SENOKOT-S Take 1 tablet by mouth at bedtime.   torsemide 20 MG tablet Commonly known as: DEMADEX Take 40 mg by mouth every other day.   torsemide 20 MG tablet Commonly known as: DEMADEX Take 20 mg by mouth every other day.   zinc oxide 20 %   ointment Apply 1 application topically as needed for irritation (To buttocks after every incontinent episode and for redness.).       Review of Systems  Constitutional: Negative for activity change, appetite change and fever.  HENT: Positive for hearing loss and mouth sores. Negative for congestion, dental problem, drooling, ear pain, facial swelling, rhinorrhea, sinus pressure, sinus pain, sore throat, trouble swallowing and voice change.   Eyes: Negative for visual disturbance.  Respiratory: Positive for shortness of breath.        DOE  Cardiovascular: Positive for leg swelling.  Gastrointestinal: Positive for constipation. Negative for abdominal pain.  Genitourinary: Negative for difficulty urinating, dysuria and urgency.  Musculoskeletal: Positive for arthralgias and gait problem.  Skin: Negative for color change.  Neurological: Negative for dizziness, speech difficulty and weakness.       Memory lapses.   Psychiatric/Behavioral: Negative for agitation, behavioral problems and sleep disturbance.    Immunization History  Administered Date(s) Administered  . Influenza Whole 10/19/2012,  09/15/2018  . Influenza, High Dose Seasonal PF 09/14/2019  . Influenza-Unspecified 10/18/2013, 10/17/2014, 09/02/2015, 09/29/2016, 10/03/2017  . Moderna SARS-COVID-2 Vaccination 12/15/2019, 01/12/2020  . PPD Test 01/22/2013, 11/28/2015  . Pneumococcal Conjugate-13 11/18/2017  . Pneumococcal Polysaccharide-23 01/22/2013  . Td 02/21/2009  . Tdap 04/07/2015   Pertinent  Health Maintenance Due  Topic Date Due  . INFLUENZA VACCINE  07/13/2020  . PNA vac Low Risk Adult  Completed   Fall Risk  09/05/2018 09/02/2017 11/14/2015 04/17/2015 09/26/2014  Falls in the past year? Yes No No Yes No  Number falls in past yr: 2 or more - - 1 -  Injury with Fall? Yes - - Yes -  Comment - - - laceration forehead 04/07/15 -  Risk Factor Category  - - - High Fall Risk -  Risk for fall due to : - - Impaired mobility;Impaired vision - -   Functional Status Survey:    Vitals:   04/07/20 1531  BP: 120/70  Pulse: 76  Resp: 18  Temp: 97.7 F (36.5 C)  SpO2: 95%  Weight: 144 lb (65.3 kg)  Height: 5' (1.524 m)   Body mass index is 28.12 kg/m. Physical Exam Vitals and nursing note reviewed.  Constitutional:      Appearance: Normal appearance.  HENT:     Head: Normocephalic and atraumatic.     Nose: Nose normal.     Mouth/Throat:     Mouth: Mucous membranes are moist.     Comments: A sore ulcerated area with regular border inside left lower lip, no active bleeding or s/s of infection.  Eyes:     Extraocular Movements: Extraocular movements intact.     Pupils: Pupils are equal, round, and reactive to light.  Cardiovascular:     Rate and Rhythm: Normal rate and regular rhythm.     Heart sounds: No murmur.  Pulmonary:     Breath sounds: Rales present.     Comments: Bibasilar rales chronic Abdominal:     General: Bowel sounds are normal. There is no distension.     Palpations: Abdomen is soft.     Tenderness: There is no abdominal tenderness.  Musculoskeletal:     Cervical back: Normal range of  motion and neck supple.     Right lower leg: Edema present.     Left lower leg: Edema present.     Comments: 1 + edema BLE  Skin:    General: Skin is warm and dry.     Comments: BLE  sensitive to touch as usual, mild erythema R+L lower shins.   Neurological:     General: No focal deficit present.     Mental Status: She is alert. Mental status is at baseline.     Gait: Gait abnormal.     Comments: Oriented to person, place.   Psychiatric:        Mood and Affect: Mood normal.        Behavior: Behavior normal.     Labs reviewed: Recent Labs    12/24/19 0000 02/27/20 0000 03/11/20 0000  NA 141 143 137  K 4.0 3.8 4.2  CL 104 104 99  CO2 31* 30* 30*  BUN 34* 33* 36*  CREATININE 1.0 1.1 1.1  CALCIUM 8.8 8.7 8.6*   Recent Labs    08/15/19 0000 12/24/19 0000 03/11/20 0000  AST 25 19 20  ALT 17 21 16  ALKPHOS 64 61 63  ALBUMIN  --  3.3* 3.3*   Recent Labs    08/15/19 0000 12/24/19 0000 03/11/20 0000  WBC 10.6 9.6 7.7  NEUTROABS  --  6,173 4,312  HGB 14.9 13.3 13.2  HCT 46 41 40  PLT 232 166 158   Lab Results  Component Value Date   TSH 2.72 08/15/2019   No results found for: HGBA1C No results found for: CHOL, HDL, LDLCALC, LDLDIRECT, TRIG, CHOLHDL  Significant Diagnostic Results in last 30 days:  No results found.  Assessment/Plan: Canker sore Left lower,  sallow sore inside of mouth lower lip ulcerated lesion, no bleeding, regular border, will apply oralJel prn x 2 weeks.   Constipation Persisted, increase Senokot S II qhs, continue Colace qhs, prn MOM.   Edema of both lower legs due to peripheral venous insufficiency Persisted, mild erythema at shins, continue Torsemide.     Family/ staff Communication: plan of care reviewed with the patient and charge nurse.   Labs/tests ordered:  None  Time spend 25 minutes.  

## 2020-04-07 NOTE — Assessment & Plan Note (Signed)
Persisted, mild erythema at shins, continue Torsemide.

## 2020-04-07 NOTE — Assessment & Plan Note (Addendum)
Persisted, increase Senokot S II qhs, continue Colace qhs, prn MOM.

## 2020-04-07 NOTE — Assessment & Plan Note (Addendum)
Left lower,  sallow sore inside of mouth lower lip ulcerated lesion, no bleeding, regular border, will apply oralJel prn x 2 weeks.

## 2020-04-08 ENCOUNTER — Encounter: Payer: Self-pay | Admitting: Nurse Practitioner

## 2020-04-21 ENCOUNTER — Encounter: Payer: Self-pay | Admitting: Nurse Practitioner

## 2020-04-21 ENCOUNTER — Non-Acute Institutional Stay (SKILLED_NURSING_FACILITY): Payer: Medicare PPO | Admitting: Nurse Practitioner

## 2020-04-21 DIAGNOSIS — M2550 Pain in unspecified joint: Secondary | ICD-10-CM | POA: Diagnosis not present

## 2020-04-21 DIAGNOSIS — W19XXXA Unspecified fall, initial encounter: Secondary | ICD-10-CM

## 2020-04-21 DIAGNOSIS — T148XXA Other injury of unspecified body region, initial encounter: Secondary | ICD-10-CM | POA: Diagnosis not present

## 2020-04-21 DIAGNOSIS — R2681 Unsteadiness on feet: Secondary | ICD-10-CM

## 2020-04-21 DIAGNOSIS — R600 Localized edema: Secondary | ICD-10-CM

## 2020-04-21 DIAGNOSIS — I872 Venous insufficiency (chronic) (peripheral): Secondary | ICD-10-CM | POA: Diagnosis not present

## 2020-04-21 DIAGNOSIS — R609 Edema, unspecified: Secondary | ICD-10-CM | POA: Diagnosis not present

## 2020-04-21 NOTE — Assessment & Plan Note (Signed)
Needs supervision/assistance for transfer, ambulation with walker.

## 2020-04-21 NOTE — Progress Notes (Signed)
Location:   [place] Room Number: 28 Place of Service:  SNF (31) Provider: Lennie Odor Jayliah Benett NP  [name], MD  Patient Care Team: [name], MD as PCP - General (Internal Medicine) [place], [place], [facility] Taliah Porche X, NP as Nurse Practitioner (Nurse Practitioner)  Extended Emergency Contact Information Primary Emergency Contact: [name] [place] of [place] Phone: [phone] Relation: Son Secondary Emergency Contact: [place] Phone: [phone] Mobile Phone: [phone] Relation: Daughter  Code Status: DNR Goals of care: Advanced Directive information Advanced Directives 04/07/2020  Does Patient Have a Medical Advance Directive? Yes  Type of Advance Directive Out of facility DNR (pink MOST or yellow form);[place];Living will  Does patient want to make changes to medical advance directive? No - Patient declined  Copy of [place] in Chart? Yes - validated most recent copy scanned in chart (See row information)  Would patient like information on creating a medical advance directive? -  Pre-existing out of facility DNR order (yellow form or pink MOST form) Yellow form placed in chart (order not valid for inpatient use);Pink MOST form placed in chart (order not valid for inpatient use)     Chief Complaint  Patient presents with  . Acute Visit    Fall, forehead contusion    HPI:  Pt is a 86 y.o. female seen today for an acute visit for reported fall 04/20/20 when the patient was found on the floor, skin tears forehead mid to the right, back of the left hand, left elbow, and  R knee, no active bleeding or s/s of infection, covered with non adhesive dressing  The patient resides in [facility] for safety, care assistance, the patient needs supervision/assistance for  transfer/ambulates with walker in her room. Generalized OA, mostly positional, prn Tylenol  is available to her. BLE edema, persisted, on Torsemide.    Past Medical History:  Diagnosis Date  . Abnormal liver function tests    11/11/15 AST 31, ALT 88, alk phos 96   . Acute kidney failure, unspecified ([place]) 01/24/2012  . Arthralgia 09/26/2014   Multiple joints: knees, shoulders, wrists, spine hips   . Arthritis   . Candidiasis of other urogenital sites 08/10/2012  . Cerebral embolism 05/23/2005  . CHF (congestive heart failure) ([place])   . Cholelithiasis 11/04/2015  . Closed fracture of sacrum and coccyx without mention of spinal cord injury 02/14/2012  . Constipation 05/03/2013  . Contusion of face, scalp, and neck except eye(s) 06/01/2012  . Contusion of wrist 06/01/2012  . COPD (chronic obstructive pulmonary disease) ([place])   . COPD, mild ([place]) 01/20/2013  . Edema 04/20/2012  . H/O: CVA (cerebrovascular accident)   . Hearing loss 09/26/2014  . History of cancer of uterus   . HTN (hypertension), benign   . Hyperglycemia 11/14/2015  . Hypothyroidism   . Insomnia, unspecified 08/10/2012  . Open wound of knee, leg (except thigh), and ankle, without mention of complication 7/0/0174  . Other and unspecified hyperlipidemia 06/01/2012  . Other disorder of coccyx 01/27/2012  . Pain in joint, ankle and foot 06/14/2012  . Peripheral vascular disease, unspecified ([place]) 01/27/2012  . Personal history of fall 01/27/2012  . Pneumonia, organism unspecified(486) 01/23/2005  . Rheumatic fever 09/26/1933   Age 43 10/01/14 ESR 13, RAF <10   . Seasonal allergies 05/03/2013  . Stasis dermatitis of both legs 09/26/2014  . Unspecified constipation 11/02/2012  . Unspecified hearing loss 02/08/2013  .  Unspecified hereditary and idiopathic peripheral neuropathy 01/27/2012  . Urinary frequency 02/24/2014  . Ventricular fibrillation ([place]) 01/24/2012   Past Surgical History:  Procedure Laterality Date  . ABDOMINAL HYSTERECTOMY  1990   for endometrial cancer  . CATARACT EXTRACTION W/ INTRAOCULAR LENS  IMPLANT,  BILATERAL    . [place]  . GUM SURGERY  1932  . MASTOIDECTOMY  1920   bilateral  . THYROIDECTOMY  1960  . TONSILLECTOMY  1916    Allergies  Allergen Reactions  . Amoxicillin     Unknown, patient unable to answer questionnaire   . Aspirin Other (See Comments)    On MAR  . Avelox [Moxifloxacin]     Unknown: listed on MAR  . Erythromycin     Unknown: listed on MAR  . Monistat [Miconazole]     Unknown: listed on MAR  . Morphine And Related     Unknown: listed on MAR  . Orange Juice [Orange Oil]     Unknown: listed on MAR    Allergies as of 04/21/2020      Reactions   Amoxicillin    Unknown, patient unable to answer questionnaire    Aspirin Other (See Comments)   On MAR   Avelox [moxifloxacin]    Unknown: listed on MAR   Erythromycin    Unknown: listed on MAR   Monistat [miconazole]    Unknown: listed on MAR   Morphine And Related    Unknown: listed on MAR   Orange Juice [orange Oil]    Unknown: listed on [facility]      Medication List       Accurate as of Apr 21, 2020 11:59 PM. If you have any questions, ask your nurse or doctor.        STOP taking these medications   ofloxacin 0.3 % ophthalmic solution Commonly known as: OCUFLOX Stopped by: Iridiana Fonner X Carolyna Yerian, NP     TAKE these medications   acetaminophen 325 MG tablet Commonly known as: TYLENOL Take 650 mg by mouth every 4 (four) hours as needed.   aluminum-magnesium hydroxide-simethicone 564-332-95 MG/5ML Susp Commonly known as: MAALOX Take 30 mLs by mouth every 6 (six) hours as needed.   aspirin 81 MG chewable tablet Chew 81 mg by mouth daily.   calcium citrate-vitamin D 315-200 MG-UNIT tablet Commonly known as: CITRACAL+D Take 1 tablet by mouth daily.   clindamycin 300 MG capsule Commonly known as: CLEOCIN Take 600 mg by mouth. DO NOT ADMINISTER THIS MEDICATION UNTIL DIRECTED BY A MEMBER OF THE ACCESS DENTAL CARE STAFF! ORDERED BY STEVEN BRYANT,DMD   docusate sodium 100 MG  capsule Commonly known as: COLACE Take 200 mg by mouth at bedtime.   fexofenadine 60 MG tablet Commonly known as: ALLEGRA Take 60 mg by mouth daily.   ipratropium-albuterol 0.5-2.5 (3) MG/3ML Soln Commonly known as: DUONEB Take 3 mLs by nebulization 2 (two) times daily.   levothyroxine 150 MCG tablet Commonly known as: SYNTHROID Take 150 mcg by mouth daily.   magnesium hydroxide 400 MG/5ML suspension Commonly known as: MILK OF MAGNESIA Take 30 mLs by mouth daily as needed for mild constipation.   melatonin 3 MG Tabs tablet Take 3 mg by mouth at bedtime.   multivitamin with minerals Tabs tablet Take 1 tablet by mouth daily. Cerovite Senior   OcuSoft Lid Scrub Allergy Pads 1 WIPE PER EYE,CLEANSE BILATERAL LIDS TWICE DAILY FOR EXCESS EYE SECRETIONS   Oral Analgesic Max St 20 % Gel Generic drug: benzocaine Use  as directed in the mouth or throat. Every 2 hours as needed   potassium chloride SA 20 MEQ tablet Commonly known as: KLOR-CON Take 20 mEq by mouth daily.   Refresh Tears 0.5 % Soln Generic drug: carboxymethylcellulose Place 1 drop into both eyes 2 (two) times daily.   sennosides-docusate sodium 8.6-50 MG tablet Commonly known as: SENOKOT-S Take 2 tablets by mouth at bedtime.   torsemide 20 MG tablet Commonly known as: DEMADEX Take 40 mg by mouth every other day.   torsemide 20 MG tablet Commonly known as: DEMADEX Take 20 mg by mouth every other day.   zinc oxide 20 % ointment Apply 1 application topically as needed for irritation (To buttocks after every incontinent episode and for redness.).       Review of Systems  Constitutional: Negative for activity change, appetite change and fever.  HENT: Positive for hearing loss. Negative for congestion and voice change.   Eyes: Negative for visual disturbance.  Respiratory: Positive for shortness of breath.        DOE  Cardiovascular: Positive for leg swelling.  Gastrointestinal: Negative for abdominal  pain and constipation.  Genitourinary: Negative for difficulty urinating, dysuria and urgency.  Musculoskeletal: Positive for arthralgias and gait problem.  Skin: Negative for color change.  Neurological: Negative for dizziness, speech difficulty and weakness.       Memory lapses.   Psychiatric/Behavioral: Negative for agitation, behavioral problems and sleep disturbance.    Immunization History  Administered Date(s) Administered  . Influenza Whole 10/19/2012, 09/15/2018  . Influenza, High Dose Seasonal PF 09/14/2019  . Influenza-Unspecified 10/18/2013, 10/17/2014, 09/02/2015, 09/29/2016, 10/03/2017  . Moderna SARS-COVID-2 Vaccination 12/15/2019, 01/12/2020  . PPD Test 01/22/2013, 11/28/2015  . Pneumococcal Conjugate-13 11/18/2017  . Pneumococcal Polysaccharide-23 01/22/2013  . Td 02/21/2009  . Tdap 04/07/2015   Pertinent  Health Maintenance Due  Topic Date Due  . INFLUENZA VACCINE  07/13/2020  . PNA vac Low Risk Adult  Completed   Fall Risk  09/05/2018 09/02/2017 11/14/2015 04/17/2015 09/26/2014  Falls in the past year? Yes No No Yes No  Number falls in past yr: 2 or more - - 1 -  Injury with Fall? Yes - - Yes -  Comment - - - laceration forehead 04/07/15 -  Risk Factor Category  - - - High Fall Risk -  Risk for fall due to : - - Impaired mobility;Impaired vision - -   Functional Status Survey:    Vitals:   04/21/20 1630  BP: 120/70  Pulse: 70  Resp: 20  Temp: (!) 97 F (36.1 C)  SpO2: 93%  Weight: 146 lb 1.6 oz (66.3 kg)  Height: 5' (1.524 m)   Body mass index is 28.53 kg/m. Physical Exam Vitals and nursing note reviewed.  Constitutional:      Appearance: Normal appearance.  HENT:     Head: Normocephalic and atraumatic.     Nose: Nose normal.     Mouth/Throat:     Mouth: Mucous membranes are moist.  Eyes:     Extraocular Movements: Extraocular movements intact.     Pupils: Pupils are equal, round, and reactive to light.  Cardiovascular:     Rate and Rhythm:  Normal rate and regular rhythm.     Heart sounds: No murmur.  Pulmonary:     Breath sounds: Rales present.     Comments: Bibasilar rales chronic Abdominal:     General: Bowel sounds are normal.     Palpations: Abdomen is soft.  Tenderness: There is no abdominal tenderness.  Musculoskeletal:     Cervical back: Normal range of motion and neck supple.     Right lower leg: Edema present.     Left lower leg: Edema present.     Comments: 1 + edema BLE  Skin:    General: Skin is warm and dry.     Comments: BLE sensitive to touch as usual, mild erythema R+L lower shins. Skin tears mid to the right forehead, back of the left hand, left elbow, right knee, no active bleeding or s/s of infection.   Neurological:     General: No focal deficit present.     Mental Status: She is alert. Mental status is at baseline.     Gait: Gait abnormal.     Comments: Oriented to person, place.   Psychiatric:        Mood and Affect: Mood normal.        Behavior: Behavior normal.     Labs reviewed: Recent Labs    12/24/19 0000 02/27/20 0000 03/11/20 0000  NA 141 143 137  K 4.0 3.8 4.2  CL 104 104 99  CO2 31* 30* 30*  BUN 34* 33* 36*  CREATININE 1.0 1.1 1.1  CALCIUM 8.8 8.7 8.6*   Recent Labs    08/15/19 0000 12/24/19 0000 03/11/20 0000  AST 25 19 20   ALT 17 21 16   ALKPHOS 64 61 63  ALBUMIN  --  3.3* 3.3*   Recent Labs    08/15/19 0000 12/24/19 0000 03/11/20 0000  WBC 10.6 9.6 7.7  NEUTROABS  --  6,173 4,312  HGB 14.9 13.3 13.2  HCT 46 41 40  PLT 232 166 158   Lab Results  Component Value Date   TSH 2.72 08/15/2019   No results found for: HGBA1C No results found for: CHOL, HDL, LDLCALC, LDLDIRECT, TRIG, CHOLHDL  Significant Diagnostic Results in last 30 days:  No results found.  Assessment/Plan: Fall Reported fall 04/20/20 when the patient was found on the floor, skin tears forehead mid to the right, back of the left hand, left elbow, and  R knee, no active bleeding or s/s  of infection, covered with non adhesive dressing. The patient's gait is unsteady, her safety awareness is poor, close supervision/assistance needed for safety.   Edema of both lower legs due to peripheral venous insufficiency Persisted, continue Torsemide.   Gait instability Needs supervision/assistance for transfer, ambulation with walker.   Arthralgia Multiple sites, positional, will schedule Tylenol 664m qd, continue prn Tylenol .   Multiple skin tears Mid to right forehead, back of the left hand, left elbow, right knee skin tears/abrasion, no active bleeding or s/s of infection, continue non adhesive dressing, observe.     Family/ staff Communication: plan of care reviewed with the patient and charge nurse.   Labs/tests ordered:  None  Time spend 25 minutes.

## 2020-04-21 NOTE — Assessment & Plan Note (Signed)
Multiple sites, positional, will schedule Tylenol 650mg  qd, continue prn Tylenol .

## 2020-04-21 NOTE — Assessment & Plan Note (Signed)
Mid to right forehead, back of the left hand, left elbow, right knee skin tears/abrasion, no active bleeding or s/s of infection, continue non adhesive dressing, observe.

## 2020-04-21 NOTE — Assessment & Plan Note (Signed)
Persisted, continue Torsemide.

## 2020-04-21 NOTE — Assessment & Plan Note (Addendum)
Reported fall 04/20/20 when the patient was found on the floor, skin tears forehead mid to the right, back of the left hand, left elbow, and  R knee, no active bleeding or s/s of infection, covered with non adhesive dressing. The patient's gait is unsteady, her safety awareness is poor, close supervision/assistance needed for safety.

## 2020-04-22 ENCOUNTER — Encounter: Payer: Self-pay | Admitting: Nurse Practitioner

## 2020-04-29 ENCOUNTER — Non-Acute Institutional Stay (SKILLED_NURSING_FACILITY): Payer: Medicare PPO | Admitting: Internal Medicine

## 2020-04-29 ENCOUNTER — Encounter: Payer: Self-pay | Admitting: Internal Medicine

## 2020-04-29 DIAGNOSIS — I872 Venous insufficiency (chronic) (peripheral): Secondary | ICD-10-CM | POA: Diagnosis not present

## 2020-04-29 DIAGNOSIS — E890 Postprocedural hypothyroidism: Secondary | ICD-10-CM | POA: Diagnosis not present

## 2020-04-29 DIAGNOSIS — R2681 Unsteadiness on feet: Secondary | ICD-10-CM

## 2020-04-29 DIAGNOSIS — K5900 Constipation, unspecified: Secondary | ICD-10-CM

## 2020-04-29 DIAGNOSIS — I10 Essential (primary) hypertension: Secondary | ICD-10-CM

## 2020-04-29 DIAGNOSIS — R609 Edema, unspecified: Secondary | ICD-10-CM

## 2020-04-29 NOTE — Progress Notes (Signed)
Location:   [place] Room Number: 28 Place of Service:  SNF [phone]) Provider:  Veleta Miners MD   [name], MD  Patient Care Team: [name], MD as PCP - General (Internal Medicine) [place], [place], [facility] Mast, Man X, NP as Nurse Practitioner (Nurse Practitioner)  Extended Emergency Contact Information Primary Emergency Contact: [name] States of [place] Phone: [phone] Relation: Son Secondary Emergency Contact: [place] Phone: [phone] Mobile Phone: [phone] Relation: Daughter  Code Status:  DNR Goals of care: Advanced Directive information Advanced Directives 04/29/2020  Does Patient Have a Medical Advance Directive? Yes  Type of Advance Directive Living will;Healthcare Power of [place];Out of facility DNR (pink MOST or yellow form)  Does patient want to make changes to medical advance directive? No - Patient declined  Copy of [place] in Chart? Yes - validated most recent copy scanned in chart (See row information)  Would patient like information on creating a medical advance directive? -  Pre-existing out of facility DNR order (yellow form or pink MOST form) Yellow form placed in chart (order not valid for inpatient use);Pink MOST form placed in chart (order not valid for inpatient use)     Chief Complaint  Patient presents with  . Medical Management of Chronic Issues    HPI:  Pt is a 86 y.o. female seen today for medical management of chronic diseases.   Patient has h/o Bilateral LE edema , Hypertension, Hard of hearing, h/o Cellulitis, Hypothyroidism, COPD, Chronic Conjunctivitis Recently had number of Falls  Stays in her Room in [facility].  Is having some issues with her teeth and working with Facility Dentist Hard to communicate due to her Hearing issues Weight is stable Continues to have LE swelling. But no Cough or SOB  Past  Medical History:  Diagnosis Date  . Abnormal liver function tests    11/11/15 AST 31, ALT 88, alk phos 96   . Acute kidney failure, unspecified ([place]) 01/24/2012  . Arthralgia 09/26/2014   Multiple joints: knees, shoulders, wrists, spine hips   . Arthritis   . Candidiasis of other urogenital sites 08/10/2012  . Cerebral embolism 05/23/2005  . CHF (congestive heart failure) ([place])   . Cholelithiasis 11/04/2015  . Closed fracture of sacrum and coccyx without mention of spinal cord injury 02/14/2012  . Constipation 05/03/2013  . Contusion of face, scalp, and neck except eye(s) 06/01/2012  . Contusion of wrist 06/01/2012  . COPD (chronic obstructive pulmonary disease) ([place])   . COPD, mild ([place]) 01/20/2013  . Edema 04/20/2012  . H/O: CVA (cerebrovascular accident)   . Hearing loss 09/26/2014  . History of cancer of uterus   . HTN (hypertension), benign   . Hyperglycemia 11/14/2015  . Hypothyroidism   . Insomnia, unspecified 08/10/2012  . Open wound of knee, leg (except thigh), and ankle, without mention of complication 07/16/6961  . Other and unspecified hyperlipidemia 06/01/2012  . Other disorder of coccyx 01/27/2012  . Pain in joint, ankle and foot 06/14/2012  . Peripheral vascular disease, unspecified ([place]) 01/27/2012  . Personal history of fall 01/27/2012  . Pneumonia, organism unspecified(486) 01/23/2005  . Rheumatic fever 09/26/1933   Age 37 10/01/14 ESR 13, RAF <10   . Seasonal allergies 05/03/2013  . Stasis dermatitis of both legs 09/26/2014  . Unspecified constipation 11/02/2012  . Unspecified hearing loss 02/08/2013  . Unspecified hereditary and idiopathic peripheral neuropathy 01/27/2012  . Urinary frequency 02/24/2014  . Ventricular  fibrillation ([place]) 01/24/2012   Past Surgical History:  Procedure Laterality Date  . ABDOMINAL HYSTERECTOMY  1990   for endometrial cancer  . CATARACT EXTRACTION W/ INTRAOCULAR LENS  IMPLANT, BILATERAL    . [place]  . GUM SURGERY  1932    . MASTOIDECTOMY  1920   bilateral  . THYROIDECTOMY  1960  . TONSILLECTOMY  1916    Allergies  Allergen Reactions  . Amoxicillin     Unknown, patient unable to answer questionnaire   . Aspirin Other (See Comments)    On MAR  . Avelox [Moxifloxacin]     Unknown: listed on MAR  . Erythromycin     Unknown: listed on MAR  . Monistat [Miconazole]     Unknown: listed on MAR  . Morphine And Related     Unknown: listed on MAR  . Orange Juice [Orange Oil]     Unknown: listed on MAR    Allergies as of 04/29/2020      Reactions   Amoxicillin    Unknown, patient unable to answer questionnaire    Aspirin Other (See Comments)   On MAR   Avelox [moxifloxacin]    Unknown: listed on MAR   Erythromycin    Unknown: listed on MAR   Monistat [miconazole]    Unknown: listed on MAR   Morphine And Related    Unknown: listed on MAR   Orange Juice [orange Oil]    Unknown: listed on [facility]      Medication List       Accurate as of Apr 29, 2020 10:03 AM. If you have any questions, ask your nurse or doctor.        STOP taking these medications   clindamycin 300 MG capsule Commonly known as: CLEOCIN Stopped by: [name], MD     TAKE these medications   acetaminophen 325 MG tablet Commonly known as: TYLENOL Take 650 mg by mouth every 4 (four) hours as needed.   aluminum-magnesium hydroxide-simethicone 182-993-71 MG/5ML Susp Commonly known as: MAALOX Take 30 mLs by mouth every 6 (six) hours as needed.   aspirin 81 MG chewable tablet Chew 81 mg by mouth daily.   calcium citrate-vitamin D 315-200 MG-UNIT tablet Commonly known as: CITRACAL+D Take 1 tablet by mouth daily.   docusate sodium 100 MG capsule Commonly known as: COLACE Take 200 mg by mouth at bedtime.   fexofenadine 60 MG tablet Commonly known as: ALLEGRA Take 60 mg by mouth daily.   ipratropium-albuterol 0.5-2.5 (3) MG/3ML Soln Commonly known as: DUONEB Take 3 mLs by nebulization 2 (two) times daily.    levothyroxine 150 MCG tablet Commonly known as: SYNTHROID Take 150 mcg by mouth daily.   magnesium hydroxide 400 MG/5ML suspension Commonly known as: MILK OF MAGNESIA Take 30 mLs by mouth daily as needed for mild constipation.   melatonin 3 MG Tabs tablet Take 3 mg by mouth at bedtime.   multivitamin with minerals Tabs tablet Take 1 tablet by mouth daily. Cerovite Senior   OcuSoft Lid Scrub Allergy Pads 1 WIPE PER EYE,CLEANSE BILATERAL LIDS TWICE DAILY FOR EXCESS EYE SECRETIONS   Oral Analgesic Max St 20 % Gel Generic drug: benzocaine Use as directed in the mouth or throat. Every 2 hours as needed   potassium chloride SA 20 MEQ tablet Commonly known as: KLOR-CON Take 20 mEq by mouth daily.   Refresh Tears 0.5 % Soln Generic drug: carboxymethylcellulose Place 1 drop into both eyes 2 (two) times daily.  sennosides-docusate sodium 8.6-50 MG tablet Commonly known as: SENOKOT-S Take 2 tablets by mouth at bedtime.   torsemide 20 MG tablet Commonly known as: DEMADEX Take 40 mg by mouth every other day.   torsemide 20 MG tablet Commonly known as: DEMADEX Take 20 mg by mouth every other day.   zinc oxide 20 % ointment Apply 1 application topically as needed for irritation (To buttocks after every incontinent episode and for redness.).       Review of Systems  Constitutional: Positive for activity change.  HENT: Negative.   Respiratory: Negative.   Cardiovascular: Positive for leg swelling.  Gastrointestinal: Negative.   Genitourinary: Negative.   Musculoskeletal: Positive for arthralgias and gait problem.  Neurological: Positive for weakness.  Psychiatric/Behavioral: Negative.     Immunization History  Administered Date(s) Administered  . Influenza Whole 10/19/2012, 09/15/2018  . Influenza, High Dose Seasonal PF 09/14/2019  . Influenza-Unspecified 10/18/2013, 10/17/2014, 09/02/2015, 09/29/2016, 10/03/2017  . Moderna SARS-COVID-2 Vaccination 12/15/2019,  01/12/2020  . PPD Test 01/22/2013, 11/28/2015  . Pneumococcal Conjugate-13 11/18/2017  . Pneumococcal Polysaccharide-23 01/22/2013  . Td 02/21/2009  . Tdap 04/07/2015   Pertinent  Health Maintenance Due  Topic Date Due  . INFLUENZA VACCINE  07/13/2020  . PNA vac Low Risk Adult  Completed   Fall Risk  09/05/2018 09/02/2017 11/14/2015 04/17/2015 09/26/2014  Falls in the past year? Yes No No Yes No  Number falls in past yr: 2 or more - - 1 -  Injury with Fall? Yes - - Yes -  Comment - - - laceration forehead 04/07/15 -  Risk Factor Category  - - - High Fall Risk -  Risk for fall due to : - - Impaired mobility;Impaired vision - -   Functional Status Survey:    Vitals:   04/29/20 0950  BP: (!) 120/54  Pulse: 85  Resp: 20  Temp: 98.7 F (37.1 C)  SpO2: 93%  Weight: 146 lb 1.6 oz (66.3 kg)  Height: 5' (1.524 m)   Body mass index is 28.53 kg/m. Physical Exam Vitals reviewed.  Constitutional:      Appearance: Normal appearance.  HENT:     Head: Normocephalic.     Nose: Nose normal.     Mouth/Throat:     Mouth: Mucous membranes are moist.     Pharynx: Oropharynx is clear.  Eyes:     Pupils: Pupils are equal, round, and reactive to light.  Cardiovascular:     Rate and Rhythm: Normal rate.     Pulses: Normal pulses.  Pulmonary:     Effort: Pulmonary effort is normal.     Breath sounds: Normal breath sounds.  Abdominal:     General: Abdomen is flat. Bowel sounds are normal.  Musculoskeletal:        General: Swelling present.     Cervical back: Neck supple.  Skin:    General: Skin is warm.  Neurological:     General: No focal deficit present.     Mental Status: She is alert.     Comments: Responds to Commands but Very hard of hearing  Psychiatric:        Mood and Affect: Mood normal.     Labs reviewed: Recent Labs    12/24/19 0000 02/27/20 0000 03/11/20 0000  NA 141 143 137  K 4.0 3.8 4.2  CL 104 104 99  CO2 31* 30* 30*  BUN 34* 33* 36*  CREATININE 1.0 1.1  1.1  CALCIUM 8.8 8.7 8.6*   Recent  Labs    08/15/19 0000 12/24/19 0000 03/11/20 0000  AST _0 ALT _1 ALKPHOS 64 61 63  ALBUMIN  --  3.3* 3.3*   Recent Labs    08/15/19 0000 12/24/19 0000 03/11/20 0000  WBC 10.6 9.6 7.7  NEUTROABS  --  6,173 4,312  HGB 14.9 13.3 13.2  HCT 46 41 40  PLT 232 166 158   Lab Results  Component Value Date   TSH 2.72 08/15/2019   No results found for: HGBA1C No results found for: CHOL, HDL, LDLCALC, LDLDIRECT, TRIG, CHOLHDL  Significant Diagnostic Results in last 30 days:  No results found.  Assessment/Plan  Bilateral leg edema Stable on Demadex BUN and Creat normal Hypothyroidism, unspecified type Same dose of Synthyroid  Essential hypertension On Demadex ? TIA On Aspirin No aggressive treatment Constipation On Senna Family/ staff Communication:   Labs/tests ordered:

## 2020-05-16 ENCOUNTER — Encounter: Payer: Self-pay | Admitting: Internal Medicine

## 2020-05-16 ENCOUNTER — Non-Acute Institutional Stay (SKILLED_NURSING_FACILITY): Payer: Medicare PPO | Admitting: Internal Medicine

## 2020-05-16 DIAGNOSIS — W19XXXA Unspecified fall, initial encounter: Secondary | ICD-10-CM | POA: Diagnosis not present

## 2020-05-16 DIAGNOSIS — M25561 Pain in right knee: Secondary | ICD-10-CM | POA: Diagnosis not present

## 2020-05-16 NOTE — Progress Notes (Signed)
Location:   [facility] Room Number: 28 Place of Service:  SNF [phone]) Provider:  Veleta Miners MD   [name], MD  Patient Care Team: [name], MD as PCP - General (Internal Medicine) [place], [place], [facility] Mast, Man X, NP as Nurse Practitioner (Nurse Practitioner)  Extended Emergency Contact Information Primary Emergency Contact: [name] States of [place] Phone: [phone] Relation: Son Secondary Emergency Contact: [place] Phone: [phone] Mobile Phone: [phone] Relation: Daughter  Code Status:  DNR Goals of care: Advanced Directive information Advanced Directives 05/16/2020  Does Patient Have a Medical Advance Directive? Yes  Type of Advance Directive Living will;Healthcare Power of [place];Out of facility DNR (pink MOST or yellow form)  Does patient want to make changes to medical advance directive? No - Patient declined  Copy of [place] in Chart? Yes - validated most recent copy scanned in chart (See row information)  Would patient like information on creating a medical advance directive? -  Pre-existing out of facility DNR order (yellow form or pink MOST form) Yellow form placed in chart (order not valid for inpatient use);Pink MOST form placed in chart (order not valid for inpatient use)     Chief Complaint  Patient presents with  . Acute Visit    fall with knee injury    HPI:  [name] is a 86 y.o. female seen today for an acute visit for fall with injury  Patient has h/o Bilateral LE edema , Hypertension, Hard of hearing, h/o Cellulitis, Hypothyroidism, COPD, Chronic Conjunctivitis Recently had number of Falls  Per nurses patient got up with the walker to go to the bathroom and then fell.  She hit her head and also her right knee.  Her right knee is now little swollen and tender and she has a skin tear on it. Patient is difficult to get history  from due to her hearing.  But every time I will move her right leg she will wince in pain.  Past Medical History:  Diagnosis Date  . Abnormal liver function tests    11/11/15 AST 31, ALT 88, alk phos 96   . Acute kidney failure, unspecified ([place]) 01/24/2012  . Arthralgia 09/26/2014   Multiple joints: knees, shoulders, wrists, spine hips   . Arthritis   . Candidiasis of other urogenital sites 08/10/2012  . Cerebral embolism 05/23/2005  . CHF (congestive heart failure) ([place])   . Cholelithiasis 11/04/2015  . Closed fracture of sacrum and coccyx without mention of spinal cord injury 02/14/2012  . Constipation 05/03/2013  . Contusion of face, scalp, and neck except eye(s) 06/01/2012  . Contusion of wrist 06/01/2012  . COPD (chronic obstructive pulmonary disease) ([place])   . COPD, mild ([place]) 01/20/2013  . Edema 04/20/2012  . H/O: CVA (cerebrovascular accident)   . Hearing loss 09/26/2014  . History of cancer of uterus   . HTN (hypertension), benign   . Hyperglycemia 11/14/2015  . Hypothyroidism   . Insomnia, unspecified 08/10/2012  . Open wound of knee, leg (except thigh), and ankle, without mention of complication 0/02/5247  . Other and unspecified hyperlipidemia 06/01/2012  . Other disorder of coccyx 01/27/2012  . Pain in joint, ankle and foot 06/14/2012  . Peripheral vascular disease, unspecified ([place]) 01/27/2012  . Personal history of fall 01/27/2012  . Pneumonia, organism unspecified(486) 01/23/2005  . Rheumatic fever 09/26/1933   Age 30 10/01/14 ESR 13, RAF <10   . Seasonal allergies 05/03/2013  .  Stasis dermatitis of both legs 09/26/2014  . Unspecified constipation 11/02/2012  . Unspecified hearing loss 02/08/2013  . Unspecified hereditary and idiopathic peripheral neuropathy 01/27/2012  . Urinary frequency 02/24/2014  . Ventricular fibrillation ([place]) 01/24/2012   Past Surgical History:  Procedure Laterality Date  . ABDOMINAL HYSTERECTOMY  1990   for endometrial cancer  . CATARACT EXTRACTION W/  INTRAOCULAR LENS  IMPLANT, BILATERAL    . [place]  . GUM SURGERY  1932  . MASTOIDECTOMY  1920   bilateral  . THYROIDECTOMY  1960  . TONSILLECTOMY  1916    Allergies  Allergen Reactions  . Amoxicillin     Unknown, patient unable to answer questionnaire   . Aspirin Other (See Comments)    On MAR  . Avelox [Moxifloxacin]     Unknown: listed on MAR  . Erythromycin     Unknown: listed on MAR  . Monistat [Miconazole]     Unknown: listed on MAR  . Morphine And Related     Unknown: listed on MAR  . Orange Juice [Orange Oil]     Unknown: listed on MAR    Allergies as of 05/16/2020      Reactions   Amoxicillin    Unknown, patient unable to answer questionnaire    Aspirin Other (See Comments)   On MAR   Avelox [moxifloxacin]    Unknown: listed on MAR   Erythromycin    Unknown: listed on MAR   Monistat [miconazole]    Unknown: listed on MAR   Morphine And Related    Unknown: listed on MAR   Orange Juice [orange Oil]    Unknown: listed on [facility]      Medication List       Accurate as of May 16, 2020  4:25 PM. If you have any questions, ask your nurse or doctor.        acetaminophen 325 MG tablet Commonly known as: TYLENOL Take 650 mg by mouth every 4 (four) hours as needed.   aluminum-magnesium hydroxide-simethicone 540-086-76 MG/5ML Susp Commonly known as: MAALOX Take 30 mLs by mouth every 6 (six) hours as needed.   aspirin 81 MG chewable tablet Chew 81 mg by mouth daily.   calcium citrate-vitamin D 315-200 MG-UNIT tablet Commonly known as: CITRACAL+D Take 1 tablet by mouth daily.   docusate sodium 100 MG capsule Commonly known as: COLACE Take 200 mg by mouth at bedtime.   fexofenadine 60 MG tablet Commonly known as: ALLEGRA Take 60 mg by mouth daily.   ipratropium-albuterol 0.5-2.5 (3) MG/3ML Soln Commonly known as: DUONEB Take 3 mLs by nebulization 2 (two) times daily.   levothyroxine 150 MCG tablet Commonly known as:  SYNTHROID Take 150 mcg by mouth daily.   magnesium hydroxide 400 MG/5ML suspension Commonly known as: MILK OF MAGNESIA Take 30 mLs by mouth daily as needed for mild constipation.   melatonin 3 MG Tabs tablet Take 3 mg by mouth at bedtime.   multivitamin with minerals Tabs tablet Take 1 tablet by mouth daily. Cerovite Senior   OcuSoft Lid Scrub Allergy Pads 1 WIPE PER EYE,CLEANSE BILATERAL LIDS TWICE DAILY FOR EXCESS EYE SECRETIONS   Oral Analgesic Max St 20 % Gel Generic drug: benzocaine Use as directed in the mouth or throat. Every 2 hours as needed   potassium chloride SA 20 MEQ tablet Commonly known as: KLOR-CON Take 20 mEq by mouth daily.   Refresh Tears 0.5 % Soln Generic drug: carboxymethylcellulose Place 1 drop into both  eyes 2 (two) times daily.   sennosides-docusate sodium 8.6-50 MG tablet Commonly known as: SENOKOT-S Take 2 tablets by mouth at bedtime.   torsemide 20 MG tablet Commonly known as: DEMADEX Take 40 mg by mouth every other day.   torsemide 20 MG tablet Commonly known as: DEMADEX Take 20 mg by mouth every other day.   zinc oxide 20 % ointment Apply 1 application topically as needed for irritation (To buttocks after every incontinent episode and for redness.).       Review of Systems  Not obtained due to patient being hard of hearing  Immunization History  Administered Date(s) Administered  . Influenza Whole 10/19/2012, 09/15/2018  . Influenza, High Dose Seasonal PF 09/14/2019  . Influenza-Unspecified 10/18/2013, 10/17/2014, 09/02/2015, 09/29/2016, 10/03/2017  . Moderna SARS-COVID-2 Vaccination 12/15/2019, 01/12/2020  . PPD Test 01/22/2013, 11/28/2015  . Pneumococcal Conjugate-13 11/18/2017  . Pneumococcal Polysaccharide-23 01/22/2013  . Td 02/21/2009  . Tdap 04/07/2015   Pertinent  Health Maintenance Due  Topic Date Due  . INFLUENZA VACCINE  07/13/2020  . PNA vac Low Risk Adult  Completed   Fall Risk  09/05/2018 09/02/2017 11/14/2015  04/17/2015 09/26/2014  Falls in the past year? Yes No No Yes No  Number falls in past yr: 2 or more - - 1 -  Injury with Fall? Yes - - Yes -  Comment - - - laceration forehead 04/07/15 -  Risk Factor Category  - - - High Fall Risk -  Risk for fall due to : - - Impaired mobility;Impaired vision - -   Functional Status Survey:    Vitals:   05/16/20 1615  BP: 118/66  Pulse: 92  Resp: 16  Temp: (!) 97 F (36.1 C)  SpO2: 93%  Weight: 142 lb (64.4 kg)  Height: 5' (1.524 m)   Body mass index is 27.73 kg/m. Physical Exam Constitutional: Well-developed and well-nourished.  HENT:  Head: Normocephalic.  Mouth/Throat: Oropharynx is clear and moist.  Eyes: Pupils are equal, round, and reactive to light.  Neck: Neck supple.  Cardiovascular: Normal rate and normal heart sounds.  No murmur heard. Pulmonary/Chest: Effort normal and breath sounds normal. No respiratory distress. No wheezes. She has no rales.  Abdominal: Soft. Bowel sounds are normal. No distension. There is no tenderness. There is no rebound.  Musculoskeletal:Bilateral Mild Edema Right Knee has some swelling and very tender to touch. Has Skin tear Lymphadenopathy: none Neurological: Alert Skin: Skin is warm and dry.  Psychiatric: Normal mood and affect. Behavior is normal. Thought content normal.  Labs reviewed: Recent Labs    12/24/19 0000 02/27/20 0000 03/11/20 0000  NA 141 143 137  K 4.0 3.8 4.2  CL 104 104 99  CO2 31* 30* 30*  BUN 34* 33* 36*  CREATININE 1.0 1.1 1.1  CALCIUM 8.8 8.7 8.6*   Recent Labs    08/15/19 0000 12/24/19 0000 03/11/20 0000  AST 25 19 20   ALT 17 21 16   ALKPHOS 64 61 63  ALBUMIN  --  3.3* 3.3*   Recent Labs    08/15/19 0000 12/24/19 0000 03/11/20 0000  WBC 10.6 9.6 7.7  NEUTROABS  --  6,173 4,312  HGB 14.9 13.3 13.2  HCT 46 41 40  PLT 232 166 158   Lab Results  Component Value Date   TSH 2.72 08/15/2019   No results found for: HGBA1C No results found for: CHOL, HDL,  LDLCALC, LDLDIRECT, TRIG, CHOLHDL  Significant Diagnostic Results in last 30 days:  No results  found.  Assessment/Plan Acute pain of right knee Xray to Rule out Fracture Addendum Xray Negative for any Fractures  Fall, initial encounter D/W DON to provide him more support when she is trying to Ambulate    Family/ staff Communication:   Labs/tests ordered:

## 2020-05-17 DIAGNOSIS — M25562 Pain in left knee: Secondary | ICD-10-CM | POA: Diagnosis not present

## 2020-05-17 DIAGNOSIS — M25561 Pain in right knee: Secondary | ICD-10-CM | POA: Diagnosis not present

## 2020-06-04 ENCOUNTER — Encounter: Payer: Self-pay | Admitting: Nurse Practitioner

## 2020-06-04 ENCOUNTER — Non-Acute Institutional Stay (SKILLED_NURSING_FACILITY): Payer: Medicare PPO | Admitting: Nurse Practitioner

## 2020-06-04 DIAGNOSIS — K5900 Constipation, unspecified: Secondary | ICD-10-CM

## 2020-06-04 DIAGNOSIS — M2550 Pain in unspecified joint: Secondary | ICD-10-CM

## 2020-06-04 DIAGNOSIS — J449 Chronic obstructive pulmonary disease, unspecified: Secondary | ICD-10-CM | POA: Diagnosis not present

## 2020-06-04 DIAGNOSIS — R609 Edema, unspecified: Secondary | ICD-10-CM | POA: Diagnosis not present

## 2020-06-04 DIAGNOSIS — E890 Postprocedural hypothyroidism: Secondary | ICD-10-CM | POA: Diagnosis not present

## 2020-06-04 DIAGNOSIS — I872 Venous insufficiency (chronic) (peripheral): Secondary | ICD-10-CM | POA: Diagnosis not present

## 2020-06-04 DIAGNOSIS — R600 Localized edema: Secondary | ICD-10-CM

## 2020-06-04 NOTE — Progress Notes (Signed)
Location:   [facility] Room Number: 28 Place of Service:  SNF (31) Provider:  Marda Stalker, Lennie Odor NP   [name], MD  Patient Care Team: [name], MD as PCP - General (Internal Medicine) [place], [place], [facility] Sosie Gato X, NP as Nurse Practitioner (Nurse Practitioner)  Extended Emergency Contact Information Primary Emergency Contact: [name] States of [place] Phone: [phone] Relation: Son Secondary Emergency Contact: [place] Phone: [phone] Mobile Phone: [phone] Relation: Daughter  Code Status: DNR Goals of care: Advanced Directive information Advanced Directives 06/04/2020  Does Patient Have a Medical Advance Directive? Yes  Type of Paramedic of [place];Living will;Out of facility DNR (pink MOST or yellow form)  Does patient want to make changes to medical advance directive? No - Patient declined  Copy of [place] in Chart? Yes - validated most recent copy scanned in chart (See row information)  Would patient like information on creating a medical advance directive? -  Pre-existing out of facility DNR order (yellow form or pink MOST form) Yellow form placed in chart (order not valid for inpatient use);Pink MOST form placed in chart (order not valid for inpatient use)     Chief Complaint  Patient presents with  . Medical Management of Chronic Issues    HPI:  Pt is a 86 y.o. female seen today for medical management of chronic diseases.     Hypothyroidism, takes Levothyroxine 185mg qd, TSH 2.72 08/15/19  Constipation, takes Colace 2075mqd, MOM prn, Senokot S II qhs  COPD, takes Allegra 6060md, DuoNeb tid  Edema BLE, chronic, takes Torsemide 2m74md, 40mg77m  OA pain, R knee, X-ray negative fx 05/16/20    Past Medical History:  Diagnosis Date  . Abnormal liver function tests    11/11/15 AST 31, ALT 88, alk phos 96   .  Acute kidney failure, unspecified (HCC) [place]1/2013  . Arthralgia 09/26/2014   Multiple joints: knees, shoulders, wrists, spine hips   . Arthritis   . Candidiasis of other urogenital sites 08/10/2012  . Cerebral embolism 05/23/2005  . CHF (congestive heart failure) (HCC) [place] Cholelithiasis 11/04/2015  . Closed fracture of sacrum and coccyx without mention of spinal cord injury 02/14/2012  . Constipation 05/03/2013  . Contusion of face, scalp, and neck except eye(s) 06/01/2012  . Contusion of wrist 06/01/2012  . COPD (chronic obstructive pulmonary disease) (HCC) [place] COPD, mild (HCC) [place]/2014  . Edema 04/20/2012  . H/O: CVA (cerebrovascular accident)   . Hearing loss 09/26/2014  . History of cancer of uterus   . HTN (hypertension), benign   . Hyperglycemia 11/14/2015  . Hypothyroidism   . Insomnia, unspecified 08/10/2012  . Open wound of knee, leg (except thigh), and ankle, without mention of complication 04/20/20/0/9323ther and unspecified hyperlipidemia 06/01/2012  . Other disorder of coccyx 01/27/2012  . Pain in joint, ankle and foot 06/14/2012  . Peripheral vascular disease, unspecified (HCC) [place]4/2013  . Personal history of fall 01/27/2012  . Pneumonia, organism unspecified(486) 01/23/2005  . Rheumatic fever 09/26/1933   Age 40 10/01/14 ESR 13, RAF <10   . Seasonal allergies 05/03/2013  . Stasis dermatitis of both legs 09/26/2014  . Unspecified constipation 11/02/2012  . Unspecified hearing loss 02/08/2013  . Unspecified hereditary and idiopathic peripheral neuropathy 01/27/2012  . Urinary frequency 02/24/2014  . Ventricular fibrillation (HCC) [place]1/2013   Past Surgical History:  Procedure Laterality Date  .  ABDOMINAL HYSTERECTOMY  1990   for endometrial cancer  . CATARACT EXTRACTION W/ INTRAOCULAR LENS  IMPLANT, BILATERAL    . ECTOPIC PREGNANCY SURGERY  1952  . GUM SURGERY  1932  . MASTOIDECTOMY  1920   bilateral  . THYROIDECTOMY  1960  . TONSILLECTOMY  1916    Allergies  Allergen  Reactions  . Amoxicillin     Unknown, patient unable to answer questionnaire   . Aspirin Other (See Comments)    On MAR  . Avelox [Moxifloxacin]     Unknown: listed on MAR  . Erythromycin     Unknown: listed on MAR  . Monistat [Miconazole]     Unknown: listed on MAR  . Morphine And Related     Unknown: listed on MAR  . Orange Juice [Orange Oil]     Unknown: listed on MAR    Allergies as of 06/04/2020      Reactions   Amoxicillin    Unknown, patient unable to answer questionnaire    Aspirin Other (See Comments)   On MAR   Avelox [moxifloxacin]    Unknown: listed on MAR   Erythromycin    Unknown: listed on MAR   Monistat [miconazole]    Unknown: listed on MAR   Morphine And Related    Unknown: listed on MAR   Orange Juice [orange Oil]    Unknown: listed on [facility]      Medication List       Accurate as of June 04, 2020 11:59 PM. If you have any questions, ask your nurse or doctor.        acetaminophen 325 MG tablet Commonly known as: TYLENOL Take 650 mg by mouth every 4 (four) hours as needed.   aluminum-magnesium hydroxide-simethicone 200-200-20 MG/5ML Susp Commonly known as: MAALOX Take 30 mLs by mouth every 6 (six) hours as needed.   aspirin 81 MG chewable tablet Chew 81 mg by mouth daily.   calcium citrate-vitamin D 315-200 MG-UNIT tablet Commonly known as: CITRACAL+D Take 1 tablet by mouth daily.   docusate sodium 100 MG capsule Commonly known as: COLACE Take 200 mg by mouth at bedtime.   fexofenadine 60 MG tablet Commonly known as: ALLEGRA Take 60 mg by mouth daily.   ipratropium-albuterol 0.5-2.5 (3) MG/3ML Soln Commonly known as: DUONEB Take 3 mLs by nebulization 2 (two) times daily.   levothyroxine 150 MCG tablet Commonly known as: SYNTHROID Take 150 mcg by mouth daily.   magnesium hydroxide 400 MG/5ML suspension Commonly known as: MILK OF MAGNESIA Take 30 mLs by mouth daily as needed for mild constipation.   melatonin 3 MG Tabs  tablet Take 3 mg by mouth at bedtime.   multivitamin with minerals Tabs tablet Take 1 tablet by mouth daily. Cerovite Senior   OcuSoft Lid Scrub Allergy Pads 1 WIPE PER EYE,CLEANSE BILATERAL LIDS TWICE DAILY FOR EXCESS EYE SECRETIONS   Oral Analgesic Max St 20 % Gel Generic drug: benzocaine Use as directed in the mouth or throat. Every 2 hours as needed   potassium chloride SA 20 MEQ tablet Commonly known as: KLOR-CON Take 20 mEq by mouth daily.   Refresh Tears 0.5 % Soln Generic drug: carboxymethylcellulose Place 1 drop into both eyes 2 (two) times daily.   sennosides-docusate sodium 8.6-50 MG tablet Commonly known as: SENOKOT-S Take 2 tablets by mouth at bedtime.   torsemide 20 MG tablet Commonly known as: DEMADEX Take 40 mg by mouth every other day.   torsemide 20 MG tablet Commonly known  as: DEMADEX Take 20 mg by mouth every other day.   zinc oxide 20 % ointment Apply 1 application topically as needed for irritation (To buttocks after every incontinent episode and for redness.).       Review of Systems  Constitutional: Negative for fatigue, fever and unexpected weight change.  HENT: Positive for hearing loss. Negative for congestion and voice change.   Eyes: Negative for visual disturbance.  Respiratory: Positive for shortness of breath. Negative for cough.        DOE  Cardiovascular: Positive for leg swelling.  Gastrointestinal: Negative for abdominal pain, constipation, nausea and vomiting.  Genitourinary: Negative for difficulty urinating, dysuria and urgency.  Musculoskeletal: Positive for arthralgias and gait problem.  Skin: Negative for color change.  Neurological: Negative for speech difficulty, weakness, light-headedness and headaches.       Memory lapses.   Psychiatric/Behavioral: Negative for behavioral problems and sleep disturbance. The patient is not nervous/anxious.     Immunization History  Administered Date(s) Administered  . Influenza  Whole 10/19/2012, 09/15/2018  . Influenza, High Dose Seasonal PF 09/14/2019  . Influenza-Unspecified 10/18/2013, 10/17/2014, 09/02/2015, 09/29/2016, 10/03/2017  . Moderna SARS-COVID-2 Vaccination 12/15/2019, 01/12/2020  . PPD Test 01/22/2013, 11/28/2015  . Pneumococcal Conjugate-13 11/18/2017  . Pneumococcal Polysaccharide-23 01/22/2013  . Td 02/21/2009  . Tdap 04/07/2015   Pertinent  Health Maintenance Due  Topic Date Due  . INFLUENZA VACCINE  07/13/2020  . PNA vac Low Risk Adult  Completed   Fall Risk  09/05/2018 09/02/2017 11/14/2015 04/17/2015 09/26/2014  Falls in the past year? Yes No No Yes No  Number falls in past yr: 2 or more - - 1 -  Injury with Fall? Yes - - Yes -  Comment - - - laceration forehead 04/07/15 -  Risk Factor Category  - - - High Fall Risk -  Risk for fall due to : - - Impaired mobility;Impaired vision - -   Functional Status Survey:    Vitals:   06/04/20 0913  BP: 136/80  Pulse: 88  Resp: 18  Temp: (!) 97.4 F (36.3 C)  SpO2: 93%  Weight: 142 lb (64.4 kg)  Height: 5' (1.524 m)   Body mass index is 27.73 kg/m. Physical Exam Vitals and nursing note reviewed.  Constitutional:      Appearance: Normal appearance.  HENT:     Head: Normocephalic and atraumatic.     Nose: Nose normal.     Mouth/Throat:     Mouth: Mucous membranes are moist.  Eyes:     Extraocular Movements: Extraocular movements intact.     Pupils: Pupils are equal, round, and reactive to light.  Cardiovascular:     Rate and Rhythm: Normal rate and regular rhythm.     Heart sounds: No murmur heard.   Pulmonary:     Breath sounds: Rales present. No wheezing.     Comments: Bibasilar rales chronic Abdominal:     General: Bowel sounds are normal.     Palpations: Abdomen is soft.     Tenderness: There is no abdominal tenderness.  Musculoskeletal:     Cervical back: Normal range of motion and neck supple.     Right lower leg: Edema present.     Left lower leg: Edema present.      Comments: 1 + edema BLE  Skin:    General: Skin is warm and dry.     Comments: BLE sensitive to touch as usual, mild erythema R+L lower shins. Skin tears mid to the  right forehead, back of the left hand, left elbow, right knee, no active bleeding or s/s of infection.   Neurological:     General: No focal deficit present.     Mental Status: She is alert. Mental status is at baseline.     Gait: Gait abnormal.     Comments: Oriented to person, place.   Psychiatric:        Mood and Affect: Mood normal.        Behavior: Behavior normal.     Labs reviewed: Recent Labs    12/24/19 0000 02/27/20 0000 03/11/20 0000  NA 141 143 137  K 4.0 3.8 4.2  CL 104 104 99  CO2 31* 30* 30*  BUN 34* 33* 36*  CREATININE 1.0 1.1 1.1  CALCIUM 8.8 8.7 8.6*   Recent Labs    08/15/19 0000 12/24/19 0000 03/11/20 0000  AST _0 ALT _1 ALKPHOS 64 61 63  ALBUMIN  --  3.3* 3.3*   Recent Labs    08/15/19 0000 12/24/19 0000 03/11/20 0000  WBC 10.6 9.6 7.7  NEUTROABS  --  6,173 4,312  HGB 14.9 13.3 13.2  HCT 46 41 40  PLT 232 166 158   Lab Results  Component Value Date   TSH 2.72 08/15/2019   No results found for: HGBA1C No results found for: CHOL, HDL, LDLCALC, LDLDIRECT, TRIG, CHOLHDL  Significant Diagnostic Results in last 30 days:  No results found.  Assessment/Plan Edema of both lower legs due to peripheral venous insufficiency Chronic, continue Torsemide, update CMP/eGFR  COPD, mild (HCC) Stable, continue Allegra 42m qd, DuoNeb tid   Hypothyroidism Last TSH 2.72 08/15/19, continue Levothyroxine 1546m qd, update TSH, CBC/diff  Arthralgia rigth knee pain, X ray was negative for fxs.   Constipation Stable, continue Colace 20037md, MOM prn, Senokot S II qhs      Family/ staff Communication: plan of care reviewed with the patient and charge nurse.   Labs/tests ordered:  CBC/diff, CMP/eGFR, TSH   Time spend 25 minutes

## 2020-06-04 NOTE — Assessment & Plan Note (Signed)
Chronic, continue Torsemide, update CMP/eGFR

## 2020-06-04 NOTE — Assessment & Plan Note (Signed)
rigth knee pain, X ray was negative for fxs.

## 2020-06-04 NOTE — Assessment & Plan Note (Signed)
Last TSH 2.72 08/15/19, continue Levothyroxine 150mcg qd, update TSH, CBC/diff 

## 2020-06-04 NOTE — Assessment & Plan Note (Signed)
Stable, continue Colace 200mg qd, MOM prn, Senokot S II qhs 

## 2020-06-04 NOTE — Assessment & Plan Note (Signed)
Stable, continue Allegra 60mg  qd, DuoNeb tid

## 2020-06-05 ENCOUNTER — Encounter: Payer: Self-pay | Admitting: Nurse Practitioner

## 2020-06-05 DIAGNOSIS — I132 Hypertensive heart and chronic kidney disease with heart failure and with stage 5 chronic kidney disease, or end stage renal disease: Secondary | ICD-10-CM | POA: Diagnosis not present

## 2020-06-06 LAB — HEPATIC FUNCTION PANEL
ALT: 12 (ref 7–35)
AST: 15 (ref 13–35)
Alkaline Phosphatase: 63 (ref 25–125)
Bilirubin, Total: 0.4

## 2020-06-06 LAB — BASIC METABOLIC PANEL
BUN: 36 — AB (ref 4–21)
CO2: 31 — AB (ref 13–22)
Chloride: 99 (ref 99–108)
Creatinine: 1.1 (ref 0.5–1.1)
Glucose: 99
Potassium: 3.8 (ref 3.4–5.3)
Sodium: 139 (ref 137–147)

## 2020-06-06 LAB — TSH: TSH: 0.49 (ref 0.41–5.90)

## 2020-06-06 LAB — COMPREHENSIVE METABOLIC PANEL
Albumin: 3.2 — AB (ref 3.5–5.0)
Calcium: 8.8 (ref 8.7–10.7)
GFR calc Af Amer: 44
GFR calc non Af Amer: 38
Globulin: 2.4

## 2020-06-06 LAB — CBC AND DIFFERENTIAL
HCT: 37 (ref 36–46)
Hemoglobin: 12.5 (ref 12.0–16.0)
Neutrophils Absolute: 4511
Platelets: 184 (ref 150–399)
WBC: 7.9

## 2020-06-06 LAB — CBC: RBC: 4.24 (ref 3.87–5.11)

## 2020-06-11 NOTE — Telephone Encounter (Signed)
This encounter was created in error - please disregard. This encounter was created in error - please disregard. This encounter was created in error - please disregard. 

## 2020-06-18 ENCOUNTER — Encounter: Payer: Self-pay | Admitting: Nurse Practitioner

## 2020-06-18 ENCOUNTER — Non-Acute Institutional Stay (SKILLED_NURSING_FACILITY): Payer: Medicare PPO | Admitting: Nurse Practitioner

## 2020-06-18 DIAGNOSIS — M2550 Pain in unspecified joint: Secondary | ICD-10-CM | POA: Diagnosis not present

## 2020-06-18 DIAGNOSIS — K5900 Constipation, unspecified: Secondary | ICD-10-CM

## 2020-06-18 DIAGNOSIS — R609 Edema, unspecified: Secondary | ICD-10-CM | POA: Diagnosis not present

## 2020-06-18 DIAGNOSIS — I872 Venous insufficiency (chronic) (peripheral): Secondary | ICD-10-CM

## 2020-06-18 DIAGNOSIS — E890 Postprocedural hypothyroidism: Secondary | ICD-10-CM | POA: Diagnosis not present

## 2020-06-18 DIAGNOSIS — J449 Chronic obstructive pulmonary disease, unspecified: Secondary | ICD-10-CM | POA: Diagnosis not present

## 2020-06-18 NOTE — Assessment & Plan Note (Signed)
Stable, continue Allegra 60mg  qd, DuoNeb bid

## 2020-06-18 NOTE — Progress Notes (Signed)
Location:    SNF [place] Room Number: 28 Place of Service:  SNF (31) Provider: [facility] Jaylan Duggar NP  [name], MD  Patient Care Team: [name], MD as PCP - General (Internal Medicine) [place] [place] [place] Hegel X, NP as Nurse Practitioner (Nurse Practitioner)  Extended Emergency Contact Information Primary Emergency Contact: [name] States of [place] Phone: [phone] Relation: Son Secondary Emergency Contact: [place] Phone: [phone] Mobile Phone: [phone] Relation: Daughter  Code Status:  DNR Goals of care: Advanced Directive information Advanced Directives 06/04/2020  Does Patient Have a Medical Advance Directive? Yes  Type of Paramedic of [place];Living will;Out of facility DNR (pink MOST or yellow form)  Does patient want to make changes to medical advance directive? No - Patient declined  Copy of [place] in Chart? Yes - validated most recent copy scanned in chart (See row information)  Would patient like information on creating a medical advance directive? -  Pre-existing out of facility DNR order (yellow form or pink MOST form) Yellow form placed in chart (order not valid for inpatient use);Pink MOST form placed in chart (order not valid for inpatient use)     Chief Complaint  Patient presents with  . Medical Management of Chronic Issues    HPI:  Pt is a 86 y.o. female seen today for medical management of chronic diseases.    Hypothyroidism, takes Levothyroxine 126mg qd, TSH 2.72 08/15/19             Constipation, takes Colace 2075mqd, MOM prn, Senokot S II qhs             COPD, takes Allegra 6029md, DuoNeb bid             Edema BLE, chronic, takes Torsemide 48m41md, 40mg44m             OA pain, R knee, X-ray negative fx 05/16/20   Past Medical History:  Diagnosis Date  . Abnormal liver function tests    11/11/15 AST 31,  ALT 88, alk phos 96   . Acute kidney failure, unspecified (HCC) [place]1/2013  . Arthralgia 09/26/2014   Multiple joints: knees, shoulders, wrists, spine hips   . Arthritis   . Candidiasis of other urogenital sites 08/10/2012  . Cerebral embolism 05/23/2005  . CHF (congestive heart failure) (HCC) [place] Cholelithiasis 11/04/2015  . Closed fracture of sacrum and coccyx without mention of spinal cord injury 02/14/2012  . Constipation 05/03/2013  . Contusion of face, scalp, and neck except eye(s) 06/01/2012  . Contusion of wrist 06/01/2012  . COPD (chronic obstructive pulmonary disease) (HCC) [place] COPD, mild (HCC) [place]/2014  . Edema 04/20/2012  . H/O: CVA (cerebrovascular accident)   . Hearing loss 09/26/2014  . History of cancer of uterus   . HTN (hypertension), benign   . Hyperglycemia 11/14/2015  . Hypothyroidism   . Insomnia, unspecified 08/10/2012  . Open wound of knee, leg (except thigh), and ankle, without mention of complication 04/20/24/4/2706ther and unspecified hyperlipidemia 06/01/2012  . Other disorder of coccyx 01/27/2012  . Pain in joint, ankle and foot 06/14/2012  . Peripheral vascular disease, unspecified (HCC) [place]4/2013  . Personal history of fall 01/27/2012  . Pneumonia, organism unspecified(486) 01/23/2005  . Rheumatic fever 09/26/1933   Age 70 10/01/14 ESR 13, RAF <10   . Seasonal allergies 05/03/2013  . Stasis dermatitis of both legs 09/26/2014  .  Unspecified constipation 11/02/2012  . Unspecified hearing loss 02/08/2013  . Unspecified hereditary and idiopathic peripheral neuropathy 01/27/2012  . Urinary frequency 02/24/2014  . Ventricular fibrillation ([place]) 01/24/2012   Past Surgical History:  Procedure Laterality Date  . ABDOMINAL HYSTERECTOMY  1990   for endometrial cancer  . CATARACT EXTRACTION W/ INTRAOCULAR LENS  IMPLANT, BILATERAL    . [place]  . GUM SURGERY  1932  . MASTOIDECTOMY  1920   bilateral  . THYROIDECTOMY  1960  . TONSILLECTOMY  1916     Allergies  Allergen Reactions  . Amoxicillin     Unknown, patient unable to answer questionnaire   . Aspirin Other (See Comments)    On MAR  . Avelox [Moxifloxacin]     Unknown: listed on MAR  . Erythromycin     Unknown: listed on MAR  . Monistat [Miconazole]     Unknown: listed on MAR  . Morphine And Related     Unknown: listed on MAR  . Orange Juice [Orange Oil]     Unknown: listed on MAR    Allergies as of 06/18/2020      Reactions   Amoxicillin    Unknown, patient unable to answer questionnaire    Aspirin Other (See Comments)   On MAR   Avelox [moxifloxacin]    Unknown: listed on MAR   Erythromycin    Unknown: listed on MAR   Monistat [miconazole]    Unknown: listed on MAR   Morphine And Related    Unknown: listed on MAR   Orange Juice [orange Oil]    Unknown: listed on [facility]      Medication List       Accurate as of June 18, 2020  1:41 PM. If you have any questions, ask your nurse or doctor.        acetaminophen 325 MG tablet Commonly known as: TYLENOL Take 650 mg by mouth every 4 (four) hours as needed.   aluminum-magnesium hydroxide-simethicone 244-628-63 MG/5ML Susp Commonly known as: MAALOX Take 30 mLs by mouth every 6 (six) hours as needed.   aspirin 81 MG chewable tablet Chew 81 mg by mouth daily.   calcium citrate-vitamin D 315-200 MG-UNIT tablet Commonly known as: CITRACAL+D Take 1 tablet by mouth daily.   docusate sodium 100 MG capsule Commonly known as: COLACE Take 200 mg by mouth at bedtime.   fexofenadine 60 MG tablet Commonly known as: ALLEGRA Take 60 mg by mouth daily.   ipratropium-albuterol 0.5-2.5 (3) MG/3ML Soln Commonly known as: DUONEB Take 3 mLs by nebulization 2 (two) times daily.   levothyroxine 150 MCG tablet Commonly known as: SYNTHROID Take 150 mcg by mouth daily.   magnesium hydroxide 400 MG/5ML suspension Commonly known as: MILK OF MAGNESIA Take 30 mLs by mouth daily as needed for mild constipation.    melatonin 3 MG Tabs tablet Take 3 mg by mouth at bedtime.   multivitamin with minerals Tabs tablet Take 1 tablet by mouth daily. Cerovite Senior   OcuSoft Lid Scrub Allergy Pads 1 WIPE PER EYE,CLEANSE BILATERAL LIDS TWICE DAILY FOR EXCESS EYE SECRETIONS   Oral Analgesic Max St 20 % Gel Generic drug: benzocaine Use as directed in the mouth or throat. Every 2 hours as needed   potassium chloride SA 20 MEQ tablet Commonly known as: KLOR-CON Take 20 mEq by mouth daily.   Refresh Tears 0.5 % Soln Generic drug: carboxymethylcellulose Place 1 drop into both eyes 2 (two) times daily.   sennosides-docusate  sodium 8.6-50 MG tablet Commonly known as: SENOKOT-S Take 2 tablets by mouth at bedtime.   torsemide 20 MG tablet Commonly known as: DEMADEX Take 40 mg by mouth every other day.   torsemide 20 MG tablet Commonly known as: DEMADEX Take 20 mg by mouth every other day.   zinc oxide 20 % ointment Apply 1 application topically as needed for irritation (To buttocks after every incontinent episode and for redness.).       Review of Systems  Constitutional: Negative for fatigue and fever.  HENT: Positive for hearing loss. Negative for congestion and voice change.   Eyes: Negative for visual disturbance.  Respiratory: Positive for shortness of breath. Negative for cough.        DOE  Cardiovascular: Positive for leg swelling.  Gastrointestinal: Negative for abdominal pain, constipation and vomiting.  Genitourinary: Negative for difficulty urinating, dysuria and urgency.  Musculoskeletal: Positive for arthralgias and gait problem.  Skin: Negative for color change.  Neurological: Negative for dizziness, speech difficulty and weakness.       Memory lapses.   Psychiatric/Behavioral: Negative for behavioral problems and sleep disturbance. The patient is not nervous/anxious.     Immunization History  Administered Date(s) Administered  . Influenza Whole 10/19/2012, 09/15/2018  .  Influenza, High Dose Seasonal PF 09/14/2019  . Influenza-Unspecified 10/18/2013, 10/17/2014, 09/02/2015, 09/29/2016, 10/03/2017  . Moderna SARS-COVID-2 Vaccination 12/15/2019, 01/12/2020  . PPD Test 01/22/2013, 11/28/2015  . Pneumococcal Conjugate-13 11/18/2017  . Pneumococcal Polysaccharide-23 01/22/2013  . Td 02/21/2009  . Tdap 04/07/2015   Pertinent  Health Maintenance Due  Topic Date Due  . INFLUENZA VACCINE  07/13/2020  . PNA vac Low Risk Adult  Completed   Fall Risk  09/05/2018 09/02/2017 11/14/2015 04/17/2015 09/26/2014  Falls in the past year? Yes No No Yes No  Number falls in past yr: 2 or more - - 1 -  Injury with Fall? Yes - - Yes -  Comment - - - laceration forehead 04/07/15 -  Risk Factor Category  - - - High Fall Risk -  Risk for fall due to : - - Impaired mobility;Impaired vision - -   Functional Status Survey:    Vitals:   06/18/20 1326  BP: 134/72  Pulse: 82  Resp: (!) 22  Temp: 97.6 F (36.4 C)  SpO2: 92%   There is no height or weight on file to calculate BMI. Physical Exam Vitals and nursing note reviewed.  Constitutional:      Appearance: Normal appearance.  HENT:     Head: Normocephalic and atraumatic.     Mouth/Throat:     Mouth: Mucous membranes are moist.  Eyes:     Extraocular Movements: Extraocular movements intact.     Pupils: Pupils are equal, round, and reactive to light.  Cardiovascular:     Rate and Rhythm: Normal rate and regular rhythm.     Heart sounds: No murmur heard.   Pulmonary:     Breath sounds: Rales present. No wheezing.     Comments: Bibasilar rales chronic Abdominal:     Palpations: Abdomen is soft.     Tenderness: There is no abdominal tenderness.  Musculoskeletal:     Cervical back: Normal range of motion and neck supple.     Right lower leg: Edema present.     Left lower leg: Edema present.     Comments: 1 + edema BLE  Skin:    General: Skin is warm and dry.     Comments: BLE sensitive to  touch as usual, mild  erythema R+L lower shins.  Neurological:     General: No focal deficit present.     Mental Status: She is alert. Mental status is at baseline.     Gait: Gait abnormal.     Comments: Oriented to person, place.   Psychiatric:        Mood and Affect: Mood normal.        Behavior: Behavior normal.     Labs reviewed: Recent Labs    12/24/19 0000 02/27/20 0000 03/11/20 0000  NA 141 143 137  K 4.0 3.8 4.2  CL 104 104 99  CO2 31* 30* 30*  BUN 34* 33* 36*  CREATININE 1.0 1.1 1.1  CALCIUM 8.8 8.7 8.6*   Recent Labs    08/15/19 0000 12/24/19 0000 03/11/20 0000  AST 25 19 20   ALT 17 21 16   ALKPHOS 64 61 63  ALBUMIN  --  3.3* 3.3*   Recent Labs    08/15/19 0000 12/24/19 0000 03/11/20 0000  WBC 10.6 9.6 7.7  NEUTROABS  --  6,173 4,312  HGB 14.9 13.3 13.2  HCT 46 41 40  PLT 232 166 158   Lab Results  Component Value Date   TSH 2.72 08/15/2019   No results found for: HGBA1C No results found for: CHOL, HDL, LDLCALC, LDLDIRECT, TRIG, CHOLHDL  Significant Diagnostic Results in last 30 days:  No results found.  Assessment/Plan  Hypothyroidism Last TSH 2.72 08/15/19, continue Levothyroxine 164mg qd, update TSH, CBC/diff  Constipation Stable, continue Colace 20103mqd, MOM prn, Senokot S II qhs  COPD, mild (HCC) Stable, continue Allegra 6071md, DuoNeb bid   Edema of both lower legs due to peripheral venous insufficiency Edema BLE, chronic, continue  Torsemide 32m83md, 40mg44m, update CMP/eGFR   Arthralgia OA pain, R knee, X-ray negative fx 05/16/20, continue Tylenol.      Family/ staff Communication: plan of care reviewed with the patient and charge nurse.   Labs/tests ordered: CBC/diff, CMP/eGFR, TSH   Time spend 25 minutes.

## 2020-06-18 NOTE — Assessment & Plan Note (Signed)
Edema BLE, chronic, continue  Torsemide 87m qod, 463mqod, update CMP/eGFR

## 2020-06-18 NOTE — Progress Notes (Signed)
This encounter was created in error - please disregard.

## 2020-06-18 NOTE — Assessment & Plan Note (Signed)
Stable, continue Colace 200mg qd, MOM prn, Senokot S II qhs 

## 2020-06-18 NOTE — Assessment & Plan Note (Signed)
Last TSH 2.72 08/15/19, continue Levothyroxine qd, update TSH, CBC/diff

## 2020-06-18 NOTE — Assessment & Plan Note (Signed)
OA pain, R knee, X-ray negative fx 05/16/20, continue Tylenol.

## 2020-06-24 DIAGNOSIS — E039 Hypothyroidism, unspecified: Secondary | ICD-10-CM | POA: Diagnosis not present

## 2020-06-25 LAB — CBC AND DIFFERENTIAL
HCT: 40 (ref 36–46)
Hemoglobin: 12.9 (ref 12.0–16.0)
Neutrophils Absolute: 5880
Platelets: 194 (ref 150–399)
WBC: 10

## 2020-06-25 LAB — HEPATIC FUNCTION PANEL
ALT: 12 (ref 7–35)
AST: 17 (ref 13–35)
Alkaline Phosphatase: 65 (ref 25–125)
Bilirubin, Total: 0.4

## 2020-06-25 LAB — BASIC METABOLIC PANEL
BUN: 34 — AB (ref 4–21)
CO2: 31 — AB (ref 13–22)
Chloride: 99 (ref 99–108)
Creatinine: 1.2 — AB (ref 0.5–1.1)
Glucose: 100
Potassium: 4.1 (ref 3.4–5.3)
Sodium: 141 (ref 137–147)

## 2020-06-25 LAB — COMPREHENSIVE METABOLIC PANEL
Albumin: 3.5 (ref 3.5–5.0)
Calcium: 9.3 (ref 8.7–10.7)
GFR calc Af Amer: 40
GFR calc non Af Amer: 35
Globulin: 2.7

## 2020-06-25 LAB — TSH: TSH: 0.44 (ref 0.41–5.90)

## 2020-06-25 LAB — CBC: RBC: 4.43 (ref 3.87–5.11)

## 2020-07-07 ENCOUNTER — Non-Acute Institutional Stay (SKILLED_NURSING_FACILITY): Payer: Medicare PPO | Admitting: Nurse Practitioner

## 2020-07-07 ENCOUNTER — Encounter: Payer: Self-pay | Admitting: Nurse Practitioner

## 2020-07-07 DIAGNOSIS — I872 Venous insufficiency (chronic) (peripheral): Secondary | ICD-10-CM

## 2020-07-07 DIAGNOSIS — E890 Postprocedural hypothyroidism: Secondary | ICD-10-CM | POA: Diagnosis not present

## 2020-07-07 DIAGNOSIS — M2550 Pain in unspecified joint: Secondary | ICD-10-CM

## 2020-07-07 DIAGNOSIS — R609 Edema, unspecified: Secondary | ICD-10-CM

## 2020-07-07 DIAGNOSIS — L89302 Pressure ulcer of unspecified buttock, stage 2: Secondary | ICD-10-CM

## 2020-07-07 DIAGNOSIS — K5900 Constipation, unspecified: Secondary | ICD-10-CM | POA: Diagnosis not present

## 2020-07-07 DIAGNOSIS — R600 Localized edema: Secondary | ICD-10-CM

## 2020-07-07 DIAGNOSIS — J449 Chronic obstructive pulmonary disease, unspecified: Secondary | ICD-10-CM

## 2020-07-07 NOTE — Assessment & Plan Note (Signed)
Stable, continue Colace, MOM, Senokot S

## 2020-07-07 NOTE — Assessment & Plan Note (Signed)
Stable, TSH 2.72 08/15/19, continue Levothyroxine.

## 2020-07-07 NOTE — Progress Notes (Signed)
Location:   SNF [place] Room Number: 28 Place of Service:  SNF (31) Provider: [facility] Gurshan Settlemire NP  [name], MD  Patient Care Team: [name], MD as PCP - General (Internal Medicine) [place] Al[place] Oshea X, NP as Nurse Practitioner (Nurse Practitioner)  Extended Emergency Contact Information Primary Emergency Contact: [name] States of [place] Phone: [phone] Relation: Son Secondary Emergency Contact: [place] Phone: [phone] Mobile Phone: [phone] Relation: Daughter  Code Status: DNR Goals of care: Advanced Directive information Advanced Directives 07/07/2020  Does Patient Have a Medical Advance Directive? Yes  Type of Advance Directive Out of facility DNR (pink MOST or yellow form)  Does patient want to make changes to medical advance directive? No - Patient declined  Copy of [place] in Chart? -  Would patient like information on creating a medical advance directive? -  Pre-existing out of facility DNR order (yellow form or pink MOST form) Pink MOST form placed in chart (order not valid for inpatient use);Yellow form placed in chart (order not valid for inpatient use)     Chief Complaint  Patient presents with  . Acute Visit    Pressure ulcer    HPI:  Pt is a 86 y.o. female seen today for an acute visit for pressure ulcers R+L buttocks. Superficial skin missing R+L buttocks, about a quarter sized, no active bleeding or s/s of infection, barrier ointment was generously applied.   Hypothyroidism, takes Levothyroxine 155mg qd, TSH 2.72 08/15/19 Constipation, takes Colace 2010mqd, MOM prn, Senokot S II qhs COPD, takes Allegra 6049md, DuoNeb bid Edema BLE, chronic, takes Torsemide 110m60md, 40mg12m OA pain, R knee, X-ray negative fx 05/16/20, prn Tylenol.       Past Medical History:  Diagnosis Date  .  Abnormal liver function tests    11/11/15 AST 31, ALT 88, alk phos 96   . Acute kidney failure, unspecified (HCC) [place]1/2013  . Arthralgia 09/26/2014   Multiple joints: knees, shoulders, wrists, spine hips   . Arthritis   . Candidiasis of other urogenital sites 08/10/2012  . Cerebral embolism 05/23/2005  . CHF (congestive heart failure) (HCC) [place] Cholelithiasis 11/04/2015  . Closed fracture of sacrum and coccyx without mention of spinal cord injury 02/14/2012  . Constipation 05/03/2013  . Contusion of face, scalp, and neck except eye(s) 06/01/2012  . Contusion of wrist 06/01/2012  . COPD (chronic obstructive pulmonary disease) (HCC) [place] COPD, mild (HCC) [place]/2014  . Edema 04/20/2012  . H/O: CVA (cerebrovascular accident)   . Hearing loss 09/26/2014  . History of cancer of uterus   . HTN (hypertension), benign   . Hyperglycemia 11/14/2015  . Hypothyroidism   . Insomnia, unspecified 08/10/2012  . Open wound of knee, leg (except thigh), and ankle, without mention of complication 04/20/28/6/7893ther and unspecified hyperlipidemia 06/01/2012  . Other disorder of coccyx 01/27/2012  . Pain in joint, ankle and foot 06/14/2012  . Peripheral vascular disease, unspecified (HCC) [place]4/2013  . Personal history of fall 01/27/2012  . Pneumonia, organism unspecified(486) 01/23/2005  . Rheumatic fever 09/26/1933   Age 34 10/01/14 ESR 13, RAF <10   . Seasonal allergies 05/03/2013  . Stasis dermatitis of both legs 09/26/2014  . Unspecified constipation 11/02/2012  . Unspecified hearing loss 02/08/2013  . Unspecified hereditary and idiopathic peripheral neuropathy 01/27/2012  . Urinary frequency 02/24/2014  . Ventricular fibrillation (HCC) [place]1/2013   Past  Surgical History:  Procedure Laterality Date  . ABDOMINAL HYSTERECTOMY  1990   for endometrial cancer  . CATARACT EXTRACTION W/ INTRAOCULAR LENS  IMPLANT, BILATERAL    . [place]  . GUM SURGERY  1932  . MASTOIDECTOMY  1920   bilateral  .  THYROIDECTOMY  1960  . TONSILLECTOMY  1916    Allergies  Allergen Reactions  . Amoxicillin     Unknown, patient unable to answer questionnaire   . Aspirin Other (See Comments)    On MAR  . Avelox [Moxifloxacin]     Unknown: listed on MAR  . Erythromycin     Unknown: listed on MAR  . Monistat [Miconazole]     Unknown: listed on MAR  . Morphine And Related     Unknown: listed on MAR  . Orange Juice [Orange Oil]     Unknown: listed on MAR    Allergies as of 07/07/2020      Reactions   Amoxicillin    Unknown, patient unable to answer questionnaire    Aspirin Other (See Comments)   On MAR   Avelox [moxifloxacin]    Unknown: listed on MAR   Erythromycin    Unknown: listed on MAR   Monistat [miconazole]    Unknown: listed on MAR   Morphine And Related    Unknown: listed on MAR   Orange Juice [orange Oil]    Unknown: listed on [facility]      Medication List       Accurate as of July 07, 2020 11:59 PM. If you have any questions, ask your nurse or doctor.        acetaminophen 325 MG tablet Commonly known as: TYLENOL Take 650 mg by mouth every 4 (four) hours as needed.   aluminum-magnesium hydroxide-simethicone 976-734-19 MG/5ML Susp Commonly known as: MAALOX Take 30 mLs by mouth every 6 (six) hours as needed.   aspirin 81 MG chewable tablet Chew 81 mg by mouth daily.   calcium citrate-vitamin D 315-200 MG-UNIT tablet Commonly known as: CITRACAL+D Take 1 tablet by mouth daily.   docusate sodium 100 MG capsule Commonly known as: COLACE Take 200 mg by mouth at bedtime.   fexofenadine 60 MG tablet Commonly known as: ALLEGRA Take 60 mg by mouth daily.   ipratropium-albuterol 0.5-2.5 (3) MG/3ML Soln Commonly known as: DUONEB Take 3 mLs by nebulization 2 (two) times daily.   levothyroxine 150 MCG tablet Commonly known as: SYNTHROID Take 150 mcg by mouth daily.   magnesium hydroxide 400 MG/5ML suspension Commonly known as: MILK OF MAGNESIA Take 30 mLs by mouth  daily as needed for mild constipation.   melatonin 3 MG Tabs tablet Take 3 mg by mouth at bedtime.   multivitamin with minerals Tabs tablet Take 1 tablet by mouth daily. Cerovite Senior   OcuSoft Lid Scrub Allergy Pads 1 WIPE PER EYE,CLEANSE BILATERAL LIDS TWICE DAILY FOR EXCESS EYE SECRETIONS   Oral Analgesic Max St 20 % Gel Generic drug: benzocaine Use as directed in the mouth or throat. Every 2 hours as needed   potassium chloride SA 20 MEQ tablet Commonly known as: KLOR-CON Take 20 mEq by mouth daily.   Refresh Tears 0.5 % Soln Generic drug: carboxymethylcellulose Place 1 drop into both eyes 2 (two) times daily.   sennosides-docusate sodium 8.6-50 MG tablet Commonly known as: SENOKOT-S Take 2 tablets by mouth at bedtime.   torsemide 20 MG tablet Commonly known as: DEMADEX Take 40 mg by mouth every other day.  torsemide 20 MG tablet Commonly known as: DEMADEX Take 20 mg by mouth every other day.   zinc oxide 20 % ointment Apply 1 application topically as needed for irritation (To buttocks after every incontinent episode and for redness.).       Review of Systems  Constitutional: Negative for activity change, appetite change and fever.  HENT: Positive for hearing loss. Negative for congestion and voice change.   Eyes: Negative for visual disturbance.  Respiratory: Positive for shortness of breath. Negative for cough.        DOE  Cardiovascular: Positive for leg swelling.  Gastrointestinal: Negative for abdominal pain and constipation.  Genitourinary: Negative for difficulty urinating, dysuria and urgency.  Musculoskeletal: Positive for arthralgias and gait problem.  Skin: Positive for wound. Negative for color change.       Pressure wounds R+L buttocks  Neurological: Negative for speech difficulty, weakness, light-headedness and headaches.       Memory lapses.   Psychiatric/Behavioral: Negative for behavioral problems and sleep disturbance. The patient is not  nervous/anxious.     Immunization History  Administered Date(s) Administered  . Influenza Whole 10/19/2012, 09/15/2018  . Influenza, High Dose Seasonal PF 09/14/2019  . Influenza-Unspecified 10/18/2013, 10/17/2014, 09/02/2015, 09/29/2016, 10/03/2017  . Moderna SARS-COVID-2 Vaccination 12/15/2019, 01/12/2020  . PPD Test 01/22/2013, 11/28/2015  . Pneumococcal Conjugate-13 11/18/2017  . Pneumococcal Polysaccharide-23 01/22/2013  . Td 02/21/2009  . Tdap 04/07/2015   Pertinent  Health Maintenance Due  Topic Date Due  . INFLUENZA VACCINE  07/13/2020  . PNA vac Low Risk Adult  Completed   Fall Risk  09/05/2018 09/02/2017 11/14/2015 04/17/2015 09/26/2014  Falls in the past year? Yes No No Yes No  Number falls in past yr: 2 or more - - 1 -  Injury with Fall? Yes - - Yes -  Comment - - - laceration forehead 04/07/15 -  Risk Factor Category  - - - High Fall Risk -  Risk for fall due to : - - Impaired mobility;Impaired vision - -   Functional Status Survey:    Vitals:   07/07/20 1318  BP: 126/74  Pulse: 75  Resp: 22  Temp: 98 F (36.7 C)  SpO2: 94%  Weight: 144 lb 14.4 oz (65.7 kg)  Height: 5' (1.524 m)   Body mass index is 28.3 kg/m. Physical Exam Vitals and nursing note reviewed.  Constitutional:      Appearance: Normal appearance.  HENT:     Head: Normocephalic and atraumatic.     Mouth/Throat:     Mouth: Mucous membranes are moist.  Eyes:     Extraocular Movements: Extraocular movements intact.     Pupils: Pupils are equal, round, and reactive to light.  Cardiovascular:     Rate and Rhythm: Normal rate and regular rhythm.     Heart sounds: No murmur heard.   Pulmonary:     Breath sounds: Rales present. No wheezing.     Comments: Bibasilar rales chronic Abdominal:     Palpations: Abdomen is soft.     Tenderness: There is no abdominal tenderness.  Musculoskeletal:     Cervical back: Normal range of motion and neck supple.     Right lower leg: Edema present.      Left lower leg: Edema present.     Comments: 1 + edema BLE  Skin:    General: Skin is warm and dry.     Comments: BLE sensitive to touch as usual, mild erythema R+L lower shins. A quarter sized  superficial pressure areas R+L buttocks, no active bleeding or s/s of infection.   Neurological:     General: No focal deficit present.     Mental Status: She is alert. Mental status is at baseline.     Gait: Gait abnormal.     Comments: Oriented to person, place.   Psychiatric:        Mood and Affect: Mood normal.        Behavior: Behavior normal.     Labs reviewed: Recent Labs    03/11/20 0000 06/06/20 0000 06/25/20 0000  NA 137 139 141  K 4.2 3.8 4.1  CL 99 99 99  CO2 30* 31* 31*  BUN 36* 36* 34*  CREATININE 1.1 1.1 1.2*  CALCIUM 8.6* 8.8 9.3   Recent Labs    03/11/20 0000 06/06/20 0000 06/25/20 0000  AST _0 ALT _1 ALKPHOS 63 63 65  ALBUMIN 3.3* 3.2* 3.5   Recent Labs    03/11/20 0000 06/06/20 0000 06/25/20 0000  WBC 7.7 7.9 10.0  NEUTROABS 4,312 4,511 5,880  HGB 13.2 12.5 12.9  HCT 40 37 40  PLT 158 184 194   Lab Results  Component Value Date   TSH 0.44 06/25/2020   No results found for: HGBA1C No results found for: CHOL, HDL, LDLCALC, LDLDIRECT, TRIG, CHOLHDL  Significant Diagnostic Results in last 30 days:  No results found.  Assessment/Plan: Pressure ulcer R+L buttocks, Superficial skin missing R+L buttocks, about a quarter sized, no active bleeding or s/s of infection, barrier ointment was generously applied. Continue barrier ointment, encourage the patient with frequent repositioning for pressure reduction, continue gel cushion use.    Arthralgia Multiple sites, continue Tylenol  Constipation Stable, continue Colace, MOM, Senokot S  Edema of both lower legs due to peripheral venous insufficiency Chronic, continue Torsemide.   COPD, mild ([place]) Stable, continue Allegra, DuoNeb.   Hypothyroidism Stable, TSH 2.72 08/15/19, continue  Levothyroxine.     Family/ staff Communication: plan of care reviewed with the patient and charge nurse.   Labs/tests ordered:  None  Time spend 25 minutes.

## 2020-07-07 NOTE — Assessment & Plan Note (Signed)
Stable, continue Allegra, DuoNeb.

## 2020-07-07 NOTE — Assessment & Plan Note (Signed)
Chronic, continue Torsemide.  

## 2020-07-07 NOTE — Assessment & Plan Note (Signed)
R+L buttocks, Superficial skin missing R+L buttocks, about a quarter sized, no active bleeding or s/s of infection, barrier ointment was generously applied. Continue barrier ointment, encourage the patient with frequent repositioning for pressure reduction, continue gel cushion use.

## 2020-07-07 NOTE — Assessment & Plan Note (Signed)
Multiple sites, continue Tylenol °

## 2020-07-08 ENCOUNTER — Encounter: Payer: Self-pay | Admitting: Nurse Practitioner

## 2020-07-22 ENCOUNTER — Encounter: Payer: Self-pay | Admitting: Nurse Practitioner

## 2020-07-22 ENCOUNTER — Non-Acute Institutional Stay (SKILLED_NURSING_FACILITY): Payer: Medicare PPO | Admitting: Nurse Practitioner

## 2020-07-22 DIAGNOSIS — J449 Chronic obstructive pulmonary disease, unspecified: Secondary | ICD-10-CM

## 2020-07-22 DIAGNOSIS — R2681 Unsteadiness on feet: Secondary | ICD-10-CM | POA: Diagnosis not present

## 2020-07-22 DIAGNOSIS — M2550 Pain in unspecified joint: Secondary | ICD-10-CM

## 2020-07-22 DIAGNOSIS — R296 Repeated falls: Secondary | ICD-10-CM | POA: Diagnosis not present

## 2020-07-22 DIAGNOSIS — I872 Venous insufficiency (chronic) (peripheral): Secondary | ICD-10-CM | POA: Diagnosis not present

## 2020-07-22 DIAGNOSIS — K5900 Constipation, unspecified: Secondary | ICD-10-CM | POA: Diagnosis not present

## 2020-07-22 DIAGNOSIS — R600 Localized edema: Secondary | ICD-10-CM

## 2020-07-22 DIAGNOSIS — R609 Edema, unspecified: Secondary | ICD-10-CM | POA: Diagnosis not present

## 2020-07-22 DIAGNOSIS — E890 Postprocedural hypothyroidism: Secondary | ICD-10-CM | POA: Diagnosis not present

## 2020-07-22 NOTE — Progress Notes (Signed)
Location:   SNF [place] Room Number: 28 Place of Service:  SNF (31) Provider: [facility] Dashauna Heymann NP  [name], MD  Patient Care Team: [name], MD as PCP - General (Internal Medicine) [place] [place] Shaliah Wann X, NP as Nurse Practitioner (Nurse Practitioner)  Extended Emergency Contact Information Primary Emergency Contact: [name] [place] of [place] Phone: [phone] Relation: Son Secondary Emergency Contact: [place] Phone: [phone] Mobile Phone: [phone] Relation: Daughter  Code Status: DNR Goals of care: Advanced Directive information Advanced Directives 07/07/2020  Does Patient Have a Medical Advance Directive? Yes  Type of Advance Directive Out of facility DNR (pink MOST or yellow form)  Does patient want to make changes to medical advance directive? No - Patient declined  Copy of [place] in Chart? -  Would patient like information on creating a medical advance directive? -  Pre-existing out of facility DNR order (yellow form or pink MOST form) Pink MOST form placed in chart (order not valid for inpatient use);Yellow form placed in chart (order not valid for inpatient use)     Chief Complaint  Patient presents with  . Acute Visit    Fall    HPI:  Pt is a 86 y.o. female seen today for an acute visit for reported fall 07/21/20 when the patient was found sitting up in front of her recliner, the patient stated the event occurred when she tried to get back to her recliner, no apparent injury, the patient needs close supervision for transfer, uses w/c for mobility.    Hypothyroidism, takes Levothyroxine 173mg qd, TSH 0.44 06/25/20 Constipation, takes Colace 2029mqd, MOM prn, Senokot S II qhs COPD, takes Allegra 6072md, DuoNebbid Edema BLE, chronic, takes Torsemide 82m17md, 40mg66m OA pain, R knee, X-ray negative  fx 05/16/20, prn Tylenol.    Past Medical History:  Diagnosis Date  . Abnormal liver function tests    11/11/15 AST 31, ALT 88, alk phos 96   . Acute kidney failure, unspecified (HCC) [place]/2013  . Arthralgia 09/26/2014   Multiple joints: knees, shoulders, wrists, spine hips   . Arthritis   . Candidiasis of other urogenital sites 08/10/2012  . Cerebral embolism 05/23/2005  . CHF (congestive heart failure) (HCC) [place] Cholelithiasis 11/04/2015  . Closed fracture of sacrum and coccyx without mention of spinal cord injury 02/14/2012  . Constipation 05/03/2013  . Contusion of face, scalp, and neck except eye(s) 06/01/2012  . Contusion of wrist 06/01/2012  . COPD (chronic obstructive pulmonary disease) (HCC) [place] COPD, mild (HCC) [place]/2014  . Edema 04/20/2012  . H/O: CVA (cerebrovascular accident)   . Hearing loss 09/26/2014  . History of cancer of uterus   . HTN (hypertension), benign   . Hyperglycemia 11/14/2015  . Hypothyroidism   . Insomnia, unspecified 08/10/2012  . Open wound of knee, leg (except thigh), and ankle, without mention of complication 04/21/19/0/1749ther and unspecified hyperlipidemia 06/01/2012  . Other disorder of coccyx 01/27/2012  . Pain in joint, ankle and foot 06/14/2012  . Peripheral vascular disease, unspecified (HCC) [place]4/2013  . Personal history of fall 01/27/2012  . Pneumonia, organism unspecified(486) 01/23/2005  . Rheumatic fever 09/26/1933   Age 15 10/01/14 ESR 13, RAF <10   . Seasonal allergies 05/03/2013  . Stasis dermatitis of both legs 09/26/2014  . Unspecified constipation 11/02/2012  . Unspecified hearing loss 02/08/2013  . Unspecified hereditary and idiopathic peripheral neuropathy  01/27/2012  . Urinary frequency 02/24/2014  . Ventricular fibrillation ([place]) 01/24/2012   Past Surgical History:  Procedure Laterality Date  . ABDOMINAL HYSTERECTOMY  1990   for endometrial cancer  . CATARACT EXTRACTION W/ INTRAOCULAR LENS  IMPLANT, BILATERAL    . [place]  . GUM SURGERY  1932  . MASTOIDECTOMY  1920   bilateral  . THYROIDECTOMY  1960  . TONSILLECTOMY  1916    Allergies  Allergen Reactions  . Amoxicillin     Unknown, patient unable to answer questionnaire   . Aspirin Other (See Comments)    On MAR  . Avelox [Moxifloxacin]     Unknown: listed on MAR  . Erythromycin     Unknown: listed on MAR  . Monistat [Miconazole]     Unknown: listed on MAR  . Morphine And Related     Unknown: listed on MAR  . Orange Juice [Orange Oil]     Unknown: listed on MAR    Allergies as of 07/22/2020      Reactions   Amoxicillin    Unknown, patient unable to answer questionnaire    Aspirin Other (See Comments)   On MAR   Avelox [moxifloxacin]    Unknown: listed on MAR   Erythromycin    Unknown: listed on MAR   Monistat [miconazole]    Unknown: listed on MAR   Morphine And Related    Unknown: listed on MAR   Orange Juice [orange Oil]    Unknown: listed on [facility]      Medication List       Accurate as of July 22, 2020 11:59 PM. If you have any questions, ask your nurse or doctor.        STOP taking these medications   aluminum-magnesium hydroxide-simethicone 856-314-97 MG/5ML Susp Commonly known as: MAALOX Stopped by: Garwood Wentzell X Dustina Scoggin, NP   magnesium hydroxide 400 MG/5ML suspension Commonly known as: MILK OF MAGNESIA Stopped by: Javon Snee X Norberta Stobaugh, NP   Oral Analgesic Max St 20 % Gel Generic drug: benzocaine Stopped by: Wayburn Shaler X Porchea Charrier, NP     TAKE these medications   acetaminophen 325 MG tablet Commonly known as: TYLENOL Take 650 mg by mouth every 4 (four) hours as needed.   aspirin 81 MG chewable tablet Chew 81 mg by mouth daily.   calcium citrate-vitamin D 315-200 MG-UNIT tablet Commonly known as: CITRACAL+D Take 1 tablet by mouth daily.   docusate sodium 100 MG capsule Commonly known as: COLACE Take 200 mg by mouth at bedtime.   fexofenadine 60 MG tablet Commonly known as: ALLEGRA Take 60 mg by mouth daily.     ipratropium-albuterol 0.5-2.5 (3) MG/3ML Soln Commonly known as: DUONEB Take 3 mLs by nebulization 2 (two) times daily.   levothyroxine 150 MCG tablet Commonly known as: SYNTHROID Take 150 mcg by mouth daily.   melatonin 3 MG Tabs tablet Take 3 mg by mouth at bedtime.   multivitamin with minerals Tabs tablet Take 1 tablet by mouth daily. Cerovite Senior   OcuSoft Lid Scrub Allergy Pads 1 WIPE PER EYE,CLEANSE BILATERAL LIDS TWICE DAILY FOR EXCESS EYE SECRETIONS   potassium chloride SA 20 MEQ tablet Commonly known as: KLOR-CON Take 20 mEq by mouth daily.   Refresh Tears 0.5 % Soln Generic drug: carboxymethylcellulose Place 1 drop into both eyes 2 (two) times daily.   sennosides-docusate sodium 8.6-50 MG tablet Commonly known as: SENOKOT-S Take 2 tablets by mouth at bedtime.   torsemide 20 MG tablet  Commonly known as: DEMADEX Take 40 mg by mouth every other day.   torsemide 20 MG tablet Commonly known as: DEMADEX Take 20 mg by mouth every other day.   zinc oxide 20 % ointment Apply 1 application topically as needed for irritation (To buttocks after every incontinent episode and for redness.).       Review of Systems  Constitutional: Negative for activity change, appetite change and fever.  HENT: Positive for hearing loss. Negative for congestion and voice change.   Eyes: Negative for visual disturbance.  Respiratory: Positive for shortness of breath. Negative for cough.        DOE  Cardiovascular: Positive for leg swelling.  Gastrointestinal: Negative for abdominal pain and constipation.  Genitourinary: Negative for difficulty urinating, dysuria and urgency.  Musculoskeletal: Positive for arthralgias and gait problem.  Skin: Negative for color change.  Neurological: Negative for speech difficulty, weakness, light-headedness and headaches.       Memory lapses.   Psychiatric/Behavioral: Negative for behavioral problems and sleep disturbance. The patient is not  nervous/anxious.     Immunization History  Administered Date(s) Administered  . Influenza Whole 10/19/2012, 09/15/2018  . Influenza, High Dose Seasonal PF 09/14/2019  . Influenza-Unspecified 10/18/2013, 10/17/2014, 09/02/2015, 09/29/2016, 10/03/2017  . Moderna SARS-COVID-2 Vaccination 12/15/2019, 01/12/2020  . PPD Test 01/22/2013, 11/28/2015  . Pneumococcal Conjugate-13 11/18/2017  . Pneumococcal Polysaccharide-23 01/22/2013  . Td 02/21/2009  . Tdap 04/07/2015   Pertinent  Health Maintenance Due  Topic Date Due  . INFLUENZA VACCINE  07/13/2020  . PNA vac Low Risk Adult  Completed   Fall Risk  09/05/2018 09/02/2017 11/14/2015 04/17/2015 09/26/2014  Falls in the past year? Yes No No Yes No  Number falls in past yr: 2 or more - - 1 -  Injury with Fall? Yes - - Yes -  Comment - - - laceration forehead 04/07/15 -  Risk Factor Category  - - - High Fall Risk -  Risk for fall due to : - - Impaired mobility;Impaired vision - -   Functional Status Survey:    Vitals:   07/22/20 1055  BP: 123/66  Pulse: 88  Resp: 20  Temp: (!) 96.3 F (35.7 C)  SpO2: 94%  Weight: 142 lb 6.4 oz (64.6 kg)  Height: 5' (1.524 m)   Body mass index is 27.81 kg/m. Physical Exam Vitals and nursing note reviewed.  Constitutional:      Appearance: Normal appearance.  HENT:     Head: Normocephalic and atraumatic.     Mouth/Throat:     Mouth: Mucous membranes are moist.  Eyes:     Extraocular Movements: Extraocular movements intact.     Pupils: Pupils are equal, round, and reactive to light.  Cardiovascular:     Rate and Rhythm: Normal rate and regular rhythm.     Heart sounds: No murmur heard.   Pulmonary:     Breath sounds: Rales present. No wheezing.     Comments: Bibasilar rales chronic Abdominal:     Palpations: Abdomen is soft.     Tenderness: There is no abdominal tenderness.  Musculoskeletal:     Cervical back: Normal range of motion and neck supple.     Right lower leg: Edema present.      Left lower leg: Edema present.     Comments: 1 + edema BLE  Skin:    General: Skin is warm and dry.     Comments: BLE sensitive to touch as usual, mild erythema R+L lower shins.  Neurological:     General: No focal deficit present.     Mental Status: She is alert. Mental status is at baseline.     Gait: Gait abnormal.     Comments: Oriented to person, place.   Psychiatric:        Mood and Affect: Mood normal.        Behavior: Behavior normal.     Labs reviewed: Recent Labs    03/11/20 0000 06/06/20 0000 06/25/20 0000  NA 137 139 141  K 4.2 3.8 4.1  CL 99 99 99  CO2 30* 31* 31*  BUN 36* 36* 34*  CREATININE 1.1 1.1 1.2*  CALCIUM 8.6* 8.8 9.3   Recent Labs    03/11/20 0000 06/06/20 0000 06/25/20 0000  AST 20 15 17   ALT 16 12 12   ALKPHOS 63 63 65  ALBUMIN 3.3* 3.2* 3.5   Recent Labs    03/11/20 0000 06/06/20 0000 06/25/20 0000  WBC 7.7 7.9 10.0  NEUTROABS 4,312 4,511 5,880  HGB 13.2 12.5 12.9  HCT 40 37 40  PLT 158 184 194   Lab Results  Component Value Date   TSH 0.44 06/25/2020   No results found for: HGBA1C No results found for: CHOL, HDL, LDLCALC, LDLDIRECT, TRIG, CHOLHDL  Significant Diagnostic Results in last 30 days:  No results found.  Assessment/Plan: Frequent falls reported fall 07/21/20 when the patient was found sitting up in front of her recliner, the patient stated the event occurred when she tried to get back to her recliner, no apparent injury, the patient needs close supervision for transfer, uses w/c for mobility.    Gait instability Assist the patient with transfer, w/c for mobility.   Hypothyroidism Continue Levothyroxine 173mg qd, TSH 0.44 06/25/20   Constipation Stable, continue Colace 2043mqd, MOM prn, Senokot S II qhs  COPD, mild (HCC) Stable, continue Allegra 6072md, DuoNebbid   Edema of both lower legs due to peripheral venous insufficiency Chronic, no open wounds, continue Torsemide.   Arthralgia Multiple  sites, DJD, continue prn Tylenol.     Family/ staff Communication: plan of care reviewed with the patient and charge nurse.   Labs/tests ordered: none  Time spend 25 minutes.   Time spend 35 minutes.

## 2020-07-22 NOTE — Assessment & Plan Note (Signed)
Chronic, no open wounds, continue Torsemide.  

## 2020-07-22 NOTE — Assessment & Plan Note (Signed)
Multiple sites, DJD, continue prn Tylenol.

## 2020-07-22 NOTE — Assessment & Plan Note (Signed)
Stable, continue Colace 200mg  qd, MOM prn, Senokot S II qhs

## 2020-07-22 NOTE — Assessment & Plan Note (Signed)
Stable, continue Allegra 60mg qd, DuoNeb bid  

## 2020-07-22 NOTE — Assessment & Plan Note (Signed)
reported fall 07/21/20 when the patient was found sitting up in front of her recliner, the patient stated the event occurred when she tried to get back to her recliner, no apparent injury, the patient needs close supervision for transfer, uses w/c for mobility.

## 2020-07-22 NOTE — Assessment & Plan Note (Signed)
Continue Levothyroxine qd, TSH 0.44 06/25/20

## 2020-07-22 NOTE — Assessment & Plan Note (Signed)
Assist the patient with transfer, w/c for mobility.  

## 2020-07-23 ENCOUNTER — Encounter: Payer: Self-pay | Admitting: Nurse Practitioner

## 2020-08-19 ENCOUNTER — Encounter: Payer: Self-pay | Admitting: Internal Medicine

## 2020-08-19 ENCOUNTER — Non-Acute Institutional Stay (SKILLED_NURSING_FACILITY): Payer: Medicare PPO | Admitting: Internal Medicine

## 2020-08-19 DIAGNOSIS — I10 Essential (primary) hypertension: Secondary | ICD-10-CM | POA: Diagnosis not present

## 2020-08-19 DIAGNOSIS — R600 Localized edema: Secondary | ICD-10-CM

## 2020-08-19 DIAGNOSIS — R609 Edema, unspecified: Secondary | ICD-10-CM

## 2020-08-19 DIAGNOSIS — J449 Chronic obstructive pulmonary disease, unspecified: Secondary | ICD-10-CM

## 2020-08-19 DIAGNOSIS — I872 Venous insufficiency (chronic) (peripheral): Secondary | ICD-10-CM | POA: Diagnosis not present

## 2020-08-19 DIAGNOSIS — E039 Hypothyroidism, unspecified: Secondary | ICD-10-CM | POA: Diagnosis not present

## 2020-08-19 DIAGNOSIS — R2681 Unsteadiness on feet: Secondary | ICD-10-CM | POA: Diagnosis not present

## 2020-08-19 NOTE — Progress Notes (Signed)
Location:   [place] Room Number: 28 Place of Service:  SNF [phone]) Provider:  Veleta Miners, MD  [name], MD  Patient Care Team: [name], MD as PCP - General (Internal Medicine) [place], [place] Mast, Man X, NP as Nurse Practitioner (Nurse Practitioner)  Extended Emergency Contact Information Primary Emergency Contact: [name] States of [place] Phone: [phone] Relation: Son Secondary Emergency Contact: [place] Phone: [phone] Mobile Phone: [phone] Relation: Daughter  Code Status:  DNR Goals of care: Advanced Directive information Advanced Directives 08/19/2020  Does Patient Have a Medical Advance Directive? Yes  Type of Advance Directive Out of facility DNR (pink MOST or yellow form)  Does patient want to make changes to medical advance directive? No - Patient declined  Copy of [place] in Chart? -  Would patient like information on creating a medical advance directive? -  Pre-existing out of facility DNR order (yellow form or pink MOST form) Pink MOST form placed in chart (order not valid for inpatient use);Yellow form placed in chart (order not valid for inpatient use)     Chief Complaint  Patient presents with  . Medical Management of Chronic Issues    Routine follow up visit  . Best Practice Recommendations    Flu vaccine    HPI:  Pt is a 86 y.o. female seen today for medical management of chronic diseases.   Patient has h/o Bilateral LE edema , Hypertension, Hard of hearing, h/o Cellulitis, Hypothyroidism, COPD, Chronic Conjunctivitis  Long term Resident Sleeps on her Recliner Weight is stable LE swelling but no Cough or SOB   Past Medical History:  Diagnosis Date  . Abnormal liver function tests    11/11/15 AST 31, ALT 88, alk phos 96   . Acute kidney failure, unspecified ([place]) 01/24/2012  . Arthralgia 09/26/2014    Multiple joints: knees, shoulders, wrists, spine hips   . Arthritis   . Candidiasis of other urogenital sites 08/10/2012  . Cerebral embolism 05/23/2005  . CHF (congestive heart failure) ([place])   . Cholelithiasis 11/04/2015  . Closed fracture of sacrum and coccyx without mention of spinal cord injury 02/14/2012  . Constipation 05/03/2013  . Contusion of face, scalp, and neck except eye(s) 06/01/2012  . Contusion of wrist 06/01/2012  . COPD (chronic obstructive pulmonary disease) ([place])   . COPD, mild ([place]) 01/20/2013  . Edema 04/20/2012  . H/O: CVA (cerebrovascular accident)   . Hearing loss 09/26/2014  . History of cancer of uterus   . HTN (hypertension), benign   . Hyperglycemia 11/14/2015  . Hypothyroidism   . Insomnia, unspecified 08/10/2012  . Open wound of knee, leg (except thigh), and ankle, without mention of complication 06/17/1949  . Other and unspecified hyperlipidemia 06/01/2012  . Other disorder of coccyx 01/27/2012  . Pain in joint, ankle and foot 06/14/2012  . Peripheral vascular disease, unspecified ([place]) 01/27/2012  . Personal history of fall 01/27/2012  . Pneumonia, organism unspecified(486) 01/23/2005  . Rheumatic fever 09/26/1933   Age 30 10/01/14 ESR 13, RAF <10   . Seasonal allergies 05/03/2013  . Stasis dermatitis of both legs 09/26/2014  . Unspecified constipation 11/02/2012  . Unspecified hearing loss 02/08/2013  . Unspecified hereditary and idiopathic peripheral neuropathy 01/27/2012  . Urinary frequency 02/24/2014  . Ventricular fibrillation ([place]) 01/24/2012   Past Surgical History:  Procedure Laterality Date  . ABDOMINAL HYSTERECTOMY  1990   for endometrial cancer  . CATARACT  EXTRACTION W/ INTRAOCULAR LENS  IMPLANT, BILATERAL    . [place]  . GUM SURGERY  1932  . MASTOIDECTOMY  1920   bilateral  . THYROIDECTOMY  1960  . TONSILLECTOMY  1916    Allergies  Allergen Reactions  . Amoxicillin     Unknown, patient unable to answer questionnaire   .  Aspirin Other (See Comments)    On MAR  . Avelox [Moxifloxacin]     Unknown: listed on MAR  . Erythromycin     Unknown: listed on MAR  . Monistat [Miconazole]     Unknown: listed on MAR  . Morphine And Related     Unknown: listed on MAR  . Orange Juice [Orange Oil]     Unknown: listed on MAR    Allergies as of 08/19/2020      Reactions   Amoxicillin    Unknown, patient unable to answer questionnaire    Aspirin Other (See Comments)   On MAR   Avelox [moxifloxacin]    Unknown: listed on MAR   Erythromycin    Unknown: listed on MAR   Monistat [miconazole]    Unknown: listed on MAR   Morphine And Related    Unknown: listed on MAR   Orange Juice [orange Oil]    Unknown: listed on [facility]      Medication List       Accurate as of August 19, 2020 10:21 AM. If you have any questions, ask your nurse or doctor.        acetaminophen 325 MG tablet Commonly known as: TYLENOL Take 650 mg by mouth every 4 (four) hours as needed.   aspirin 81 MG chewable tablet Chew 81 mg by mouth daily.   calcium citrate-vitamin D 315-200 MG-UNIT tablet Commonly known as: CITRACAL+D Take 1 tablet by mouth daily.   docusate sodium 100 MG capsule Commonly known as: COLACE Take 200 mg by mouth at bedtime.   fexofenadine 60 MG tablet Commonly known as: ALLEGRA Take 60 mg by mouth daily.   ipratropium-albuterol 0.5-2.5 (3) MG/3ML Soln Commonly known as: DUONEB Take 3 mLs by nebulization 2 (two) times daily.   levothyroxine 150 MCG tablet Commonly known as: SYNTHROID Take 150 mcg by mouth daily.   melatonin 3 MG Tabs tablet Take 3 mg by mouth at bedtime.   multivitamin with minerals Tabs tablet Take 1 tablet by mouth daily. Cerovite Senior   OcuSoft Lid Scrub Allergy Pads 1 WIPE PER EYE,CLEANSE BILATERAL LIDS TWICE DAILY FOR EXCESS EYE SECRETIONS   potassium chloride SA 20 MEQ tablet Commonly known as: KLOR-CON Take 20 mEq by mouth daily.   Refresh Tears 0.5 % Soln Generic drug:  carboxymethylcellulose Place 1 drop into both eyes 2 (two) times daily.   sennosides-docusate sodium 8.6-50 MG tablet Commonly known as: SENOKOT-S Take 2 tablets by mouth at bedtime.   torsemide 20 MG tablet Commonly known as: DEMADEX Take 40 mg by mouth every other day.   torsemide 20 MG tablet Commonly known as: DEMADEX Take 20 mg by mouth every other day.   zinc oxide 20 % ointment Apply 1 application topically as needed for irritation (To buttocks after every incontinent episode and for redness.).       Review of Systems  Constitutional: Positive for activity change.  HENT: Negative.   Respiratory: Negative.   Cardiovascular: Positive for leg swelling.  Gastrointestinal: Negative.   Genitourinary: Negative.   Musculoskeletal: Positive for gait problem.  Skin: Negative.   Neurological:  Positive for weakness.  Psychiatric/Behavioral: Positive for sleep disturbance.    Immunization History  Administered Date(s) Administered  . Influenza Whole 10/19/2012, 09/15/2018  . Influenza, High Dose Seasonal PF 09/14/2019  . Influenza-Unspecified 10/18/2013, 10/17/2014, 09/02/2015, 09/29/2016, 10/03/2017  . Moderna SARS-COVID-2 Vaccination 12/15/2019, 01/12/2020  . PPD Test 01/22/2013, 11/28/2015  . Pneumococcal Conjugate-13 11/18/2017  . Pneumococcal Polysaccharide-23 01/22/2013  . Td 02/21/2009  . Tdap 04/07/2015   Pertinent  Health Maintenance Due  Topic Date Due  . INFLUENZA VACCINE  07/13/2020  . PNA vac Low Risk Adult  Completed   Fall Risk  09/05/2018 09/02/2017 11/14/2015 04/17/2015 09/26/2014  Falls in the past year? Yes No No Yes No  Number falls in past yr: 2 or more - - 1 -  Injury with Fall? Yes - - Yes -  Comment - - - laceration forehead 04/07/15 -  Risk Factor Category  - - - High Fall Risk -  Risk for fall due to : - - Impaired mobility;Impaired vision - -   Functional Status Survey:    Vitals:   08/19/20 1013  BP: 126/76  Pulse: 67  Resp: 18  Temp:  (!) 97.4 F (36.3 C)  SpO2: 93%  Weight: 142 lb 6.4 oz (64.6 kg)  Height: 5' (1.524 m)   Body mass index is 27.81 kg/m. Physical Exam Vitals reviewed.  Constitutional:      Appearance: Normal appearance.  HENT:     Head: Normocephalic.     Nose: Nose normal.     Mouth/Throat:     Mouth: Mucous membranes are moist.     Pharynx: Oropharynx is clear.  Eyes:     Pupils: Pupils are equal, round, and reactive to light.  Cardiovascular:     Rate and Rhythm: Normal rate and regular rhythm.     Pulses: Normal pulses.  Pulmonary:     Effort: Pulmonary effort is normal.  Abdominal:     General: Abdomen is flat. Bowel sounds are normal.     Palpations: Abdomen is soft.  Musculoskeletal:        General: Swelling present.  Skin:    General: Skin is warm.  Neurological:     General: No focal deficit present.     Mental Status: She is alert.  Psychiatric:        Mood and Affect: Mood normal.        Thought Content: Thought content normal.     Labs reviewed: Recent Labs    03/11/20 0000 06/06/20 0000 06/25/20 0000  NA 137 139 141  K 4.2 3.8 4.1  CL 99 99 99  CO2 30* 31* 31*  BUN 36* 36* 34*  CREATININE 1.1 1.1 1.2*  CALCIUM 8.6* 8.8 9.3   Recent Labs    03/11/20 0000 06/06/20 0000 06/25/20 0000  AST 20 15 17   ALT 16 12 12   ALKPHOS 63 63 65  ALBUMIN 3.3* 3.2* 3.5   Recent Labs    03/11/20 0000 06/06/20 0000 06/25/20 0000  WBC 7.7 7.9 10.0  NEUTROABS 4,312 4,511 5,880  HGB 13.2 12.5 12.9  HCT 40 37 40  PLT 158 184 194   Lab Results  Component Value Date   TSH 0.44 06/25/2020   No results found for: HGBA1C No results found for: CHOL, HDL, LDLCALC, LDLDIRECT, TRIG, CHOLHDL  Significant Diagnostic Results in last 30 days:  No results found.  Assessment/Plan Bilateral leg edema On Demadex Creat mildily high Continue to monitor Hypothyroidism, unspecified type TSH normal in  4/21 Same dose of Synthyroid  Essential hypertension Only on  Demadex ? TIA On Aspirin No aggressive treatment Constipation On Senna   Family/ staff Communication:   Labs/tests ordered:

## 2020-10-01 ENCOUNTER — Encounter: Payer: Self-pay | Admitting: Nurse Practitioner

## 2020-10-01 ENCOUNTER — Non-Acute Institutional Stay (SKILLED_NURSING_FACILITY): Payer: Medicare PPO | Admitting: Nurse Practitioner

## 2020-10-01 DIAGNOSIS — I872 Venous insufficiency (chronic) (peripheral): Secondary | ICD-10-CM

## 2020-10-01 DIAGNOSIS — E890 Postprocedural hypothyroidism: Secondary | ICD-10-CM

## 2020-10-01 DIAGNOSIS — I10 Essential (primary) hypertension: Secondary | ICD-10-CM

## 2020-10-01 DIAGNOSIS — R600 Localized edema: Secondary | ICD-10-CM | POA: Diagnosis not present

## 2020-10-01 DIAGNOSIS — K5900 Constipation, unspecified: Secondary | ICD-10-CM | POA: Diagnosis not present

## 2020-10-01 DIAGNOSIS — I25119 Atherosclerotic heart disease of native coronary artery with unspecified angina pectoris: Secondary | ICD-10-CM | POA: Diagnosis not present

## 2020-10-01 DIAGNOSIS — J449 Chronic obstructive pulmonary disease, unspecified: Secondary | ICD-10-CM

## 2020-10-01 NOTE — Assessment & Plan Note (Signed)
COPD, takes Allegra 60mg qd, DuoNebbid  

## 2020-10-01 NOTE — Progress Notes (Signed)
Location:   [place] Room Number: 28 Place of Service:  SNF (31) Provider:  Marlana Latus, NP  [name], MD  Patient Care Team: [name], MD as PCP - General (Internal Medicine) [place], [place], [facility] Isak Sotomayor X, NP as Nurse Practitioner (Nurse Practitioner)  Extended Emergency Contact Information Primary Emergency Contact: [name] [place] Phone: [phone] Relation: Son Secondary Emergency Contact: [place] Phone: [phone] Mobile Phone: [phone] Relation: Daughter  Code Status:  DNR Goals of care: Advanced Directive information Advanced Directives 08/19/2020  Does Patient Have a Medical Advance Directive? Yes  Type of Advance Directive Out of facility DNR (pink MOST or yellow form)  Does patient want to make changes to medical advance directive? No - Patient declined  Copy of [place] in Chart? -  Would patient like information on creating a medical advance directive? -  Pre-existing out of facility DNR order (yellow form or pink MOST form) Pink MOST form placed in chart (order not valid for inpatient use);Yellow form placed in chart (order not valid for inpatient use)     Chief Complaint  Patient presents with  . Medical Management of Chronic Issues    Routine follow up visit    HPI:  Pt is a 86 y.o. female seen today for medical management of chronic diseases.    Hypothyroidism, takes Levothyroxine 155mg qd, TSH 0.44 06/25/20 Constipation, takes Colace 2079mqd, MOM prn, Senokot S II qhs COPD, takes Allegra 6027md, DuoNebbid Edema BLE, chronic, takes Torsemide 10m57md, 40mg29m OA pain, R knee, X-ray negative fx 05/16/20, prn Tylenol.  TIA ?, Hx of CAD,  takes ASA 81mg 35m    Past Medical History:  Diagnosis Date  . Abnormal liver function tests    11/11/15 AST 31, ALT 88,  alk phos 96   . Acute kidney failure, unspecified (HCC) [place]/2013  . Arthralgia 09/26/2014   Multiple joints: knees, shoulders, wrists, spine hips   . Arthritis   . Candidiasis of other urogenital sites 08/10/2012  . Cerebral embolism 05/23/2005  . CHF (congestive heart failure) (HCC)  GolovinCholelithiasis 11/04/2015  . Closed fracture of sacrum and coccyx without mention of spinal cord injury 02/14/2012  . Constipation 05/03/2013  . Contusion of face, scalp, and neck except eye(s) 06/01/2012  . Contusion of wrist 06/01/2012  . COPD (chronic obstructive pulmonary disease) (HCC)  [place]D, mild (HCC) [place]  . Edema 04/20/2012  . H/O: CVA (cerebrovascular accident)   . Hearing loss 09/26/2014  . History of cancer of uterus   . HTN (hypertension), benign   . Hyperglycemia 11/14/2015  . Hypothyroidism   . Insomnia, unspecified 08/10/2012  . Open wound of knee, leg (except thigh), and ankle, without mention of complication 5/9/203/8/1829her and unspecified hyperlipidemia 06/01/2012  . Other disorder of coccyx 01/27/2012  . Pain in joint, ankle and foot 06/14/2012  . Peripheral vascular disease, unspecified (HCC) [place]/2013  . Personal history of fall 01/27/2012  . Pneumonia, organism unspecified(486) 01/23/2005  . Rheumatic fever 09/26/1933   Age 33 10/01/14 ESR 13, RAF <10   . Seasonal allergies 05/03/2013  . Stasis dermatitis of both legs 09/26/2014  . Unspecified constipation 11/02/2012  . Unspecified hearing loss 02/08/2013  . Unspecified hereditary and idiopathic peripheral neuropathy 01/27/2012  . Urinary frequency 02/24/2014  . Ventricular fibrillation (HCC) [place]/2013   Past Surgical History:  Procedure Laterality Date  .  ABDOMINAL HYSTERECTOMY  1990   for endometrial cancer  . CATARACT EXTRACTION W/ INTRAOCULAR LENS  IMPLANT, BILATERAL    . [place]  . GUM SURGERY  1932  . MASTOIDECTOMY  1920   bilateral  . THYROIDECTOMY  1960  . TONSILLECTOMY  1916     Allergies  Allergen Reactions  . Amoxicillin     Unknown, patient unable to answer questionnaire   . Aspirin Other (See Comments)    On MAR  . Avelox [Moxifloxacin]     Unknown: listed on MAR  . Erythromycin     Unknown: listed on MAR  . Monistat [Miconazole]     Unknown: listed on MAR  . Morphine And Related     Unknown: listed on MAR  . Orange Juice [Orange Oil]     Unknown: listed on MAR    Allergies as of 10/01/2020      Reactions   Amoxicillin    Unknown, patient unable to answer questionnaire    Aspirin Other (See Comments)   On MAR   Avelox [moxifloxacin]    Unknown: listed on MAR   Erythromycin    Unknown: listed on MAR   Monistat [miconazole]    Unknown: listed on MAR   Morphine And Related    Unknown: listed on MAR   Orange Juice [orange Oil]    Unknown: listed on [facility]      Medication List       Accurate as of October 01, 2020 11:59 PM. If you have any questions, ask your nurse or doctor.        acetaminophen 325 MG tablet Commonly known as: TYLENOL Take 650 mg by mouth every 4 (four) hours as needed.   aspirin 81 MG chewable tablet Chew 81 mg by mouth daily.   calcium citrate-vitamin D 315-200 MG-UNIT tablet Commonly known as: CITRACAL+D Take 1 tablet by mouth daily.   docusate sodium 100 MG capsule Commonly known as: COLACE Take 200 mg by mouth at bedtime.   fexofenadine 60 MG tablet Commonly known as: ALLEGRA Take 60 mg by mouth daily.   ipratropium-albuterol 0.5-2.5 (3) MG/3ML Soln Commonly known as: DUONEB Take 3 mLs by nebulization 2 (two) times daily.   levothyroxine 150 MCG tablet Commonly known as: SYNTHROID Take 150 mcg by mouth daily.   melatonin 3 MG Tabs tablet Take 3 mg by mouth at bedtime.   multivitamin with minerals Tabs tablet Take 1 tablet by mouth daily. Cerovite Senior   OcuSoft Lid Scrub Allergy Pads 1 WIPE PER EYE,CLEANSE BILATERAL LIDS TWICE DAILY FOR EXCESS EYE SECRETIONS   potassium chloride SA 20  MEQ tablet Commonly known as: KLOR-CON Take 20 mEq by mouth daily.   Refresh Tears 0.5 % Soln Generic drug: carboxymethylcellulose Place 1 drop into both eyes 2 (two) times daily.   sennosides-docusate sodium 8.6-50 MG tablet Commonly known as: SENOKOT-S Take 2 tablets by mouth at bedtime.   torsemide 20 MG tablet Commonly known as: DEMADEX Take 40 mg by mouth every other day.   torsemide 20 MG tablet Commonly known as: DEMADEX Take 20 mg by mouth every other day.   zinc oxide 20 % ointment Apply 1 application topically as needed for irritation (To buttocks after every incontinent episode and for redness.).       Review of Systems  Constitutional: Negative for fever and unexpected weight change.  HENT: Positive for hearing loss. Negative for congestion and voice change.   Eyes: Negative for visual disturbance.  Respiratory: Positive for shortness of breath. Negative for cough.        DOE  Cardiovascular: Positive for leg swelling.  Gastrointestinal: Negative for abdominal pain and constipation.  Genitourinary: Negative for difficulty urinating, dysuria and urgency.  Musculoskeletal: Positive for arthralgias and gait problem.  Skin: Negative for color change.  Neurological: Negative for dizziness, speech difficulty and weakness.       Memory lapses.   Psychiatric/Behavioral: Negative for behavioral problems and sleep disturbance. The patient is not nervous/anxious.     Immunization History  Administered Date(s) Administered  . Influenza Whole 10/19/2012, 09/15/2018  . Influenza, High Dose Seasonal PF 09/14/2019  . Influenza-Unspecified 10/18/2013, 10/17/2014, 09/02/2015, 09/29/2016, 10/03/2017, 09/23/2020  . Moderna SARS-COVID-2 Vaccination 12/15/2019, 01/12/2020  . PPD Test 01/22/2013, 11/28/2015  . Pneumococcal Conjugate-13 11/18/2017  . Pneumococcal Polysaccharide-23 01/22/2013  . Td 02/21/2009  . Tdap 04/07/2015   Pertinent  Health Maintenance Due  Topic Date  Due  . INFLUENZA VACCINE  Completed  . PNA vac Low Risk Adult  Completed   Fall Risk  09/05/2018 09/02/2017 11/14/2015 04/17/2015 09/26/2014  Falls in the past year? Yes No No Yes No  Number falls in past yr: 2 or more - - 1 -  Injury with Fall? Yes - - Yes -  Comment - - - laceration forehead 04/07/15 -  Risk Factor Category  - - - High Fall Risk -  Risk for fall due to : - - Impaired mobility;Impaired vision - -   Functional Status Survey:    Vitals:   10/01/20 1013  BP: (!) 160/92  Pulse: 94  Resp: 18  Temp: 98.2 F (36.8 C)  SpO2: 94%  Weight: 141 lb 3.2 oz (64 kg)  Height: 5' (1.524 m)   Body mass index is 27.58 kg/m. Physical Exam Vitals and nursing note reviewed.  Constitutional:      Appearance: Normal appearance.  HENT:     Head: Normocephalic and atraumatic.     Mouth/Throat:     Mouth: Mucous membranes are moist.  Eyes:     Extraocular Movements: Extraocular movements intact.     Pupils: Pupils are equal, round, and reactive to light.  Cardiovascular:     Rate and Rhythm: Normal rate and regular rhythm.     Heart sounds: No murmur heard.   Pulmonary:     Breath sounds: Rales present.     Comments: Bibasilar rales chronic Abdominal:     General: Bowel sounds are normal.     Palpations: Abdomen is soft.     Tenderness: There is no abdominal tenderness.  Musculoskeletal:     Cervical back: Normal range of motion and neck supple.     Right lower leg: Edema present.     Left lower leg: Edema present.     Comments: Trace edema BLE  Skin:    General: Skin is warm and dry.     Comments: BLE sensitive to touch as usual, mild erythema R+L lower shins.   Neurological:     General: No focal deficit present.     Mental Status: She is alert. Mental status is at baseline.     Gait: Gait abnormal.     Comments: Oriented to person, place.   Psychiatric:        Mood and Affect: Mood normal.        Behavior: Behavior normal.     Labs reviewed: Recent Labs     03/11/20 0000 06/06/20 0000 06/25/20 0000  NA 137 139  141  K 4.2 3.8 4.1  CL 99 99 99  CO2 30* 31* 31*  BUN 36* 36* 34*  CREATININE 1.1 1.1 1.2*  CALCIUM 8.6* 8.8 9.3   Recent Labs    03/11/20 0000 06/06/20 0000 06/25/20 0000  AST _0 ALT _1 ALKPHOS 63 63 65  ALBUMIN 3.3* 3.2* 3.5   Recent Labs    03/11/20 0000 06/06/20 0000 06/25/20 0000  WBC 7.7 7.9 10.0  NEUTROABS 4,312 4,511 5,880  HGB 13.2 12.5 12.9  HCT 40 37 40  PLT 158 184 194   Lab Results  Component Value Date   TSH 0.44 06/25/2020   No results found for: HGBA1C No results found for: CHOL, HDL, LDLCALC, LDLDIRECT, TRIG, CHOLHDL  Significant Diagnostic Results in last 30 days:  No results found.  Assessment/Plan There are no diagnoses linked to this encounter.   Family/ staff Communication:   Labs/tests ordered:

## 2020-10-01 NOTE — Assessment & Plan Note (Signed)
Edema BLE, chronic, takes Torsemide 20mg  qod, 40mg  qod

## 2020-10-01 NOTE — Assessment & Plan Note (Signed)
TIA ?, Hx of CAD, takes ASA 81mg qd. 

## 2020-10-01 NOTE — Assessment & Plan Note (Signed)
Constipation, takes Colace 200mg qd, MOM prn, Senokot S II qhs  

## 2020-10-01 NOTE — Assessment & Plan Note (Signed)
Hypothyroidism, takes Levothyroxine 150mcg qd, TSH0.44 06/25/20  

## 2020-10-02 ENCOUNTER — Encounter: Payer: Self-pay | Admitting: Nurse Practitioner

## 2020-10-02 NOTE — Assessment & Plan Note (Signed)
Re-checked Bp 122/62 mmHg

## 2020-10-12 ENCOUNTER — Emergency Department (HOSPITAL_COMMUNITY): Payer: Medicare PPO

## 2020-10-12 ENCOUNTER — Emergency Department (HOSPITAL_COMMUNITY)
Admission: EM | Admit: 2020-10-12 | Discharge: 2020-10-12 | Disposition: A | Discharge status: Home / Self Care | Payer: Medicare PPO | Attending: Emergency Medicine | Admitting: Emergency Medicine

## 2020-10-12 ENCOUNTER — Encounter (HOSPITAL_COMMUNITY): Payer: Self-pay

## 2020-10-12 ENCOUNTER — Other Ambulatory Visit: Payer: Self-pay

## 2020-10-12 DIAGNOSIS — M25522 Pain in left elbow: Secondary | ICD-10-CM | POA: Diagnosis not present

## 2020-10-12 DIAGNOSIS — M549 Dorsalgia, unspecified: Secondary | ICD-10-CM | POA: Insufficient documentation

## 2020-10-12 DIAGNOSIS — R531 Weakness: Secondary | ICD-10-CM | POA: Diagnosis not present

## 2020-10-12 DIAGNOSIS — M47816 Spondylosis without myelopathy or radiculopathy, lumbar region: Secondary | ICD-10-CM | POA: Diagnosis not present

## 2020-10-12 DIAGNOSIS — I130 Hypertensive heart and chronic kidney disease with heart failure and stage 1 through stage 4 chronic kidney disease, or unspecified chronic kidney disease: Secondary | ICD-10-CM | POA: Insufficient documentation

## 2020-10-12 DIAGNOSIS — W01118A Fall on same level from slipping, tripping and stumbling with subsequent striking against other sharp object, initial encounter: Secondary | ICD-10-CM | POA: Insufficient documentation

## 2020-10-12 DIAGNOSIS — Y92002 Bathroom of unspecified non-institutional (private) residence single-family (private) house as the place of occurrence of the external cause: Secondary | ICD-10-CM | POA: Insufficient documentation

## 2020-10-12 DIAGNOSIS — Z7951 Long term (current) use of inhaled steroids: Secondary | ICD-10-CM | POA: Insufficient documentation

## 2020-10-12 DIAGNOSIS — J449 Chronic obstructive pulmonary disease, unspecified: Secondary | ICD-10-CM | POA: Diagnosis not present

## 2020-10-12 DIAGNOSIS — I6529 Occlusion and stenosis of unspecified carotid artery: Secondary | ICD-10-CM | POA: Diagnosis not present

## 2020-10-12 DIAGNOSIS — N183 Chronic kidney disease, stage 3 unspecified: Secondary | ICD-10-CM | POA: Diagnosis not present

## 2020-10-12 DIAGNOSIS — M542 Cervicalgia: Secondary | ICD-10-CM | POA: Diagnosis not present

## 2020-10-12 DIAGNOSIS — R519 Headache, unspecified: Secondary | ICD-10-CM | POA: Diagnosis not present

## 2020-10-12 DIAGNOSIS — M545 Low back pain, unspecified: Secondary | ICD-10-CM | POA: Diagnosis not present

## 2020-10-12 DIAGNOSIS — Z86718 Personal history of other venous thrombosis and embolism: Secondary | ICD-10-CM | POA: Diagnosis not present

## 2020-10-12 DIAGNOSIS — H748X3 Other specified disorders of middle ear and mastoid, bilateral: Secondary | ICD-10-CM | POA: Diagnosis not present

## 2020-10-12 DIAGNOSIS — M25561 Pain in right knee: Secondary | ICD-10-CM | POA: Diagnosis not present

## 2020-10-12 DIAGNOSIS — Z7982 Long term (current) use of aspirin: Secondary | ICD-10-CM | POA: Insufficient documentation

## 2020-10-12 DIAGNOSIS — I2510 Atherosclerotic heart disease of native coronary artery without angina pectoris: Secondary | ICD-10-CM | POA: Diagnosis not present

## 2020-10-12 DIAGNOSIS — M2550 Pain in unspecified joint: Secondary | ICD-10-CM | POA: Diagnosis not present

## 2020-10-12 DIAGNOSIS — M25562 Pain in left knee: Secondary | ICD-10-CM | POA: Insufficient documentation

## 2020-10-12 DIAGNOSIS — Z79899 Other long term (current) drug therapy: Secondary | ICD-10-CM | POA: Diagnosis not present

## 2020-10-12 DIAGNOSIS — R42 Dizziness and giddiness: Secondary | ICD-10-CM | POA: Diagnosis not present

## 2020-10-12 DIAGNOSIS — M25521 Pain in right elbow: Secondary | ICD-10-CM | POA: Diagnosis not present

## 2020-10-12 DIAGNOSIS — I509 Heart failure, unspecified: Secondary | ICD-10-CM | POA: Diagnosis not present

## 2020-10-12 DIAGNOSIS — E039 Hypothyroidism, unspecified: Secondary | ICD-10-CM | POA: Insufficient documentation

## 2020-10-12 DIAGNOSIS — Z8542 Personal history of malignant neoplasm of other parts of uterus: Secondary | ICD-10-CM | POA: Insufficient documentation

## 2020-10-12 DIAGNOSIS — W19XXXA Unspecified fall, initial encounter: Secondary | ICD-10-CM

## 2020-10-12 DIAGNOSIS — R102 Pelvic and perineal pain: Secondary | ICD-10-CM | POA: Diagnosis not present

## 2020-10-12 DIAGNOSIS — Z7401 Bed confinement status: Secondary | ICD-10-CM | POA: Diagnosis not present

## 2020-10-12 DIAGNOSIS — M40204 Unspecified kyphosis, thoracic region: Secondary | ICD-10-CM | POA: Diagnosis not present

## 2020-10-12 DIAGNOSIS — I700 Atherosclerosis of aorta: Secondary | ICD-10-CM | POA: Diagnosis not present

## 2020-10-12 DIAGNOSIS — M160 Bilateral primary osteoarthritis of hip: Secondary | ICD-10-CM | POA: Diagnosis not present

## 2020-10-12 DIAGNOSIS — Z743 Need for continuous supervision: Secondary | ICD-10-CM | POA: Diagnosis not present

## 2020-10-12 DIAGNOSIS — M2548 Effusion, other site: Secondary | ICD-10-CM | POA: Diagnosis not present

## 2020-10-12 DIAGNOSIS — G9389 Other specified disorders of brain: Secondary | ICD-10-CM | POA: Diagnosis not present

## 2020-10-12 NOTE — Discharge Instructions (Signed)
Your imaging today was reassuring.   Follow up with your primary care doctor.

## 2020-10-12 NOTE — ED Provider Notes (Signed)
[facility] DEPT Provider Note   CSN: [id number] Arrival date & time: 10/12/20  1138     History No chief complaint on file.   [name] is a 86 y.o. female brought in by EMS for evaluation of fall.  Patient reports that she was on the toilet and stood up to clean herself off when she felt lightheaded and dizzy, causing her to fall.  She states she hit her head but she does not think she lost consciousness.  She is not not on blood thinners.  She reports pain to her head, knees, back and tailbone.  She states she has pain in her knees and elbows but states she always has that because of arthritis.  She walks with the assistance of a walker.  She denies any chest pain or abdominal pain.  She states that sensation of getting up and having lightheadedness when she stands up is happened to her before.   The history is provided by the patient. The history is limited by the condition of the patient (Patient HOH).       Past Medical History:  Diagnosis Date  . Abnormal liver function tests    11/11/15 AST 31, ALT 88, alk phos 96   . Acute kidney failure, unspecified ([place]) 01/24/2012  . Arthralgia 09/26/2014   Multiple joints: knees, shoulders, wrists, spine hips   . Arthritis   . Candidiasis of other urogenital sites 08/10/2012  . Cerebral embolism 05/23/2005  . CHF (congestive heart failure) ([place])   . Cholelithiasis 11/04/2015  . Closed fracture of sacrum and coccyx without mention of spinal cord injury 02/14/2012  . Constipation 05/03/2013  . Contusion of face, scalp, and neck except eye(s) 06/01/2012  . Contusion of wrist 06/01/2012  . COPD (chronic obstructive pulmonary disease) ([place])   . COPD, mild ([place]) 01/20/2013  . Edema 04/20/2012  . H/O: CVA (cerebrovascular accident)   . Hearing loss 09/26/2014  . History of cancer of uterus   . HTN (hypertension), benign   . Hyperglycemia 11/14/2015  . Hypothyroidism   . Insomnia, unspecified 08/10/2012  .  Open wound of knee, leg (except thigh), and ankle, without mention of complication 01/15/3006  . Other and unspecified hyperlipidemia 06/01/2012  . Other disorder of coccyx 01/27/2012  . Pain in joint, ankle and foot 06/14/2012  . Peripheral vascular disease, unspecified ([place]) 01/27/2012  . Personal history of fall 01/27/2012  . Pneumonia, organism unspecified(486) 01/23/2005  . Rheumatic fever 09/26/1933   Age 26 10/01/14 ESR 13, RAF <10   . Seasonal allergies 05/03/2013  . Stasis dermatitis of both legs 09/26/2014  . Unspecified constipation 11/02/2012  . Unspecified hearing loss 02/08/2013  . Unspecified hereditary and idiopathic peripheral neuropathy 01/27/2012  . Urinary frequency 02/24/2014  . Ventricular fibrillation ([place]) 01/24/2012    Patient Active Problem List   Diagnosis Date Noted  . Chronic kidney disease (CKD), stage III (moderate) 08/15/2019  . Allergy 06/21/2019  . Blepharitis of both eyes 05/14/2019  . Pressure injury of sacral region, stage 1 04/07/2019  . Edema of both lower legs due to peripheral venous insufficiency 03/21/2019  . Advanced care planning/counseling discussion 09/01/2018  . Frequent falls 08/18/2018  . CAD (coronary artery disease) 07/17/2018  . PVD (peripheral vascular disease) ([place]) 02/21/2018  . Pressure ulcer 06/02/2016  . Compression fracture of lumbar vertebra ([place]) 11/14/2015  . Cholelithiasis 11/04/2015  . Gait instability 09/26/2014  . Arthralgia 09/26/2014  . Hearing loss 09/26/2014  . Constipation 05/03/2013  .  Neuropathic pain of both legs 05/03/2013  . COPD, mild ([place]) 01/20/2013  . HTN (hypertension) 01/20/2013  . Hypothyroidism 01/20/2013    Past Surgical History:  Procedure Laterality Date  . ABDOMINAL HYSTERECTOMY  1990   for endometrial cancer  . CATARACT EXTRACTION W/ INTRAOCULAR LENS  IMPLANT, BILATERAL    . [place]  . GUM SURGERY  1932  . MASTOIDECTOMY  1920   bilateral  . THYROIDECTOMY  1960  .  TONSILLECTOMY  1916     OB History   No obstetric history on file.     Family History  Problem Relation Age of Onset  . Stroke Mother   . Heart disease Father     Social History   Tobacco Use  . Smoking status: Never Smoker  . Smokeless tobacco: Never Used  Substance Use Topics  . Alcohol use: No  . Drug use: No    Home Medications Prior to Admission medications   Medication Sig Start Date End Date Taking? Authorizing Provider  acetaminophen (TYLENOL) 325 MG tablet Take 650 mg by mouth every 4 (four) hours as needed for moderate pain.    Yes [provider]  aspirin 81 MG chewable tablet Chew 81 mg by mouth daily.   Yes [provider]  calcium citrate-vitamin D (CITRACAL+D) 315-200 MG-UNIT per tablet Take 1 tablet by mouth daily.   Yes [provider]  carboxymethylcellulose (REFRESH TEARS) 0.5 % SOLN Place 1 drop into both eyes 2 (two) times daily. 04/20/18  Yes [provider]  docusate sodium (COLACE) 100 MG capsule Take 200 mg by mouth at bedtime.    Yes [provider]  Eyelid Cleansers (OCUSOFT LID SCRUB ALLERGY) PADS Place 1 each into both eyes in the morning and at bedtime. Cleanse bilateral lids for excess eye secretions.   Yes [provider]  fexofenadine (ALLEGRA) 60 MG tablet Take 60 mg by mouth daily.   Yes [provider]  ipratropium-albuterol (DUONEB) 0.5-2.5 (3) MG/3ML SOLN Take 3 mLs by nebulization 2 (two) times daily.    Yes [provider]  levothyroxine (SYNTHROID, LEVOTHROID) 150 MCG tablet Take 150 mcg by mouth daily.  10/14/15  Yes [provider]  Melatonin 3 MG TABS Take 3 mg by mouth at bedtime.    Yes [provider]  Multiple Vitamins-Minerals (CEROVITE SENIOR PO) Take 1 tablet by mouth daily.   Yes [provider]  potassium chloride SA (K-DUR) 20 MEQ tablet Take 20 mEq by mouth daily.   Yes [provider]  sennosides-docusate sodium  (SENOKOT-S) 8.6-50 MG tablet Take 2 tablets by mouth at bedtime.    Yes [provider]  torsemide (DEMADEX) 20 MG tablet Take 40 mg by mouth every other day.    Yes [provider]  torsemide (DEMADEX) 20 MG tablet Take 20 mg by mouth every other day.   Yes [provider]  zinc oxide 20 % ointment Apply 1 application topically daily as needed for irritation (To buttocks after every incontinent episode and for redness.).    Yes [provider]    Allergies    Amoxicillin, Aspirin, Avelox [moxifloxacin], Erythromycin, Monistat [miconazole], Morphine and related, and Orange juice [orange oil]  Review of Systems   Review of Systems  Respiratory: Negative for shortness of breath.   Cardiovascular: Negative for chest pain.  Gastrointestinal: Negative for abdominal pain, nausea and vomiting.  Musculoskeletal: Positive for back pain and neck pain.  Tailbone pain  Neurological: Negative for headaches.  All other systems reviewed and are negative.   Physical Exam Updated Vital Signs BP (!) 146/80 (BP Location: Right Arm)   Pulse 87   Temp 97.7 F (36.5 C) (Oral)   Resp 16   Ht 5' 4" (1.626 m)   Wt 68 kg   SpO2 96%   BMI 25.75 kg/m   Physical Exam Vitals and nursing note reviewed.  Constitutional:      Appearance: Normal appearance. She is well-developed.  HENT:     Head: Normocephalic.      Comments: Small hematoma noted to the posterior left head.  No overlying laceration.  No underlying skull deformity or crepitus noted. Eyes:     General: Lids are normal.     Conjunctiva/sclera: Conjunctivae normal.     Pupils: Pupils are equal, round, and reactive to light.  Neck:     Comments: Diffuse tenderness palpation that extends from the midline to bilateral paraspinal muscles.  No deformity or crepitus noted. Cardiovascular:     Rate and Rhythm: Normal rate and regular rhythm.     Pulses: Normal pulses.          Radial pulses are 2+ on the  right side and 2+ on the left side.       Dorsalis pedis pulses are 2+ on the right side and 2+ on the left side.     Heart sounds: Normal heart sounds. No murmur heard.  No friction rub. No gallop.   Pulmonary:     Effort: Pulmonary effort is normal.     Breath sounds: Normal breath sounds.     Comments: Lungs clear to auscultation bilaterally.  Symmetric chest rise.  No wheezing, rales, rhonchi. Chest:     Comments: No anterior chest wall tenderness.  No deformity or crepitus noted.  No evidence of flail chest. Abdominal:     Palpations: Abdomen is soft. Abdomen is not rigid.     Tenderness: There is no abdominal tenderness. There is no guarding.     Comments: Abdomen is soft, non-distended, non-tender. No rigidity, No guarding. No peritoneal signs.  Musculoskeletal:        General: Normal range of motion.     Comments: Diffuse tenderness noted to midline T and L-spine.  No deformity or crepitus noted.  No pelvic instability.  She can flex and extend bilateral lower extremities with any difficulty.  No point tenderness noted to bilateral femurs, bilateral knees, bilateral tib-fib, bilateral ankles.  Skin:    General: Skin is warm and dry.     Capillary Refill: Capillary refill takes less than 2 seconds.  Neurological:     Mental Status: She is alert and oriented to person, place, and time.     Comments: Follows commands, Moves all extremities  5/5 strength to BUE and BLE  Sensation intact throughout all major nerve distributions  Psychiatric:        Speech: Speech normal.     ED Results / Procedures / Treatments   Labs (all labs ordered are listed, but only abnormal results are displayed) Labs Reviewed - No data to display  EKG None  Radiology DG Thoracic Spine 2 View  Result Date: 10/12/2020 CLINICAL DATA:  Pain post fall. EXAM: THORACIC SPINE 2 VIEWS COMPARISON:  None. FINDINGS: There is no evidence of thoracic spine fracture. Superior endplate compression deformity of  L1 vertebral body with approximately 30% height loss, chronic from 2016. Thoracic kyphosis. Calcific atherosclerotic disease of the  aorta and tortuosity. IMPRESSION: Superior endplate compression deformity of L1 vertebral body with approximately 30% height loss, chronic since 2016. No evidence of thoracic spine fracture. Electronically Signed   By: Fidela Salisbury M.D.   On: 10/12/2020 13:12   DG Lumbar Spine Complete  Result Date: 10/12/2020 CLINICAL DATA:  Pain after fall. EXAM: LUMBAR SPINE - COMPLETE 4+ VIEW COMPARISON:  MRI February 04, 2012 FINDINGS: A compression fracture seen at the L1 level, similar in appearance since the February 04, 2012 MRI. No acute fractures are seen. Lower lumbar facet degenerative changes. Mild multilevel degenerative disc disease. Calcified atherosclerosis in the abdominal aorta. No other acute abnormalities. IMPRESSION: 1. No acute fracture or traumatic malalignment. Chronic compression fracture of L1. 2. Mild degenerative disc disease and lower lumbar facet degenerative changes. Electronically Signed   By: Dorise Bullion III M.D   On: 10/12/2020 13:11   DG Pelvis 1-2 Views  Result Date: 10/12/2020 CLINICAL DATA:  Fall with pain EXAM: PELVIS - 1-2 VIEW COMPARISON:  CT abdomen pelvis dated 11/04/2015. FINDINGS: There is no evidence of pelvic fracture or diastasis. Mild to moderate degenerative changes in both hips. Severe degenerative changes in the lumbar spine. IMPRESSION: No acute fracture or dislocation. Electronically Signed   By: Zerita Boers M.D.   On: 10/12/2020 13:22   CT Head Wo Contrast  Result Date: 10/12/2020 CLINICAL DATA:  Fall with head and neck pain. EXAM: CT HEAD WITHOUT CONTRAST CT CERVICAL SPINE WITHOUT CONTRAST TECHNIQUE: Multidetector CT imaging of the head and cervical spine was performed following the standard protocol without intravenous contrast. Multiplanar CT image reconstructions of the cervical spine were also generated.  COMPARISON:  CT head and cervical spine dated 08/17/2018. FINDINGS: CT HEAD FINDINGS Brain: No evidence of acute infarction, hemorrhage, hydrocephalus, extra-axial collection or mass lesion/mass effect. There is moderate cerebral volume loss with associated ex vacuo dilatation. Periventricular white matter hypoattenuation likely represents chronic small vessel ischemic disease. Vascular: There are vascular calcifications in the carotid siphons. Skull: Postsurgical changes from prior bilateral mastoidectomies. No acute fracture. Sinuses/Orbits: There are bilateral mastoid effusions. Other: None. CT CERVICAL SPINE FINDINGS Alignment: Normal. Skull base and vertebrae: No acute fracture. No primary bone lesion or focal pathologic process. Soft tissues and spinal canal: No prevertebral fluid or swelling. No visible canal hematoma. Disc levels:  Severe multilevel degenerative disc and joint disease. Upper chest: Negative. Other: None IMPRESSION: 1. No acute intracranial process. 2. No acute osseous injury in the cervical spine. 3. Bilateral mastoid effusions. Electronically Signed   By: Zerita Boers M.D.   On: 10/12/2020 13:20   CT Cervical Spine Wo Contrast  Result Date: 10/12/2020 CLINICAL DATA:  Fall with head and neck pain. EXAM: CT HEAD WITHOUT CONTRAST CT CERVICAL SPINE WITHOUT CONTRAST TECHNIQUE: Multidetector CT imaging of the head and cervical spine was performed following the standard protocol without intravenous contrast. Multiplanar CT image reconstructions of the cervical spine were also generated. COMPARISON:  CT head and cervical spine dated 08/17/2018. FINDINGS: CT HEAD FINDINGS Brain: No evidence of acute infarction, hemorrhage, hydrocephalus, extra-axial collection or mass lesion/mass effect. There is moderate cerebral volume loss with associated ex vacuo dilatation. Periventricular white matter hypoattenuation likely represents chronic small vessel ischemic disease. Vascular: There are vascular  calcifications in the carotid siphons. Skull: Postsurgical changes from prior bilateral mastoidectomies. No acute fracture. Sinuses/Orbits: There are bilateral mastoid effusions. Other: None. CT CERVICAL SPINE FINDINGS Alignment: Normal. Skull base and vertebrae: No acute fracture. No primary bone lesion or focal pathologic  process. Soft tissues and spinal canal: No prevertebral fluid or swelling. No visible canal hematoma. Disc levels:  Severe multilevel degenerative disc and joint disease. Upper chest: Negative. Other: None IMPRESSION: 1. No acute intracranial process. 2. No acute osseous injury in the cervical spine. 3. Bilateral mastoid effusions. Electronically Signed   By: Zerita Boers M.D.   On: 10/12/2020 13:20    Procedures Procedures (including critical care time)  Medications Ordered in ED Medications - No data to display  ED Course  I have reviewed the triage vital signs and the nursing notes.  Pertinent labs & imaging results that were available during my care of the patient were reviewed by me and considered in my medical decision making (see chart for details).    MDM Rules/Calculators/A&P                          86 year old female who presents for evaluation of fall.  She was on the toilet and states that when she stood up, she felt lightheaded/dizzy.  This is happened to her before.  This caused her to fall.  She was reporting her head.  No LOC.  She is not on blood thinners.  On initial arrival, she is afebrile, nontoxic-appearing.  Vital signs are stable.  She follows commands.  She has diffuse tenderness noted to head, neck and back.  No pelvic instability.  She is able to move all 4 extremities with any difficulty.  She does complain of some pain to her knees and elbows but states that is baseline for her given her history of arthritis.  Plan to check imaging of head, neck, back, pelvis.  Xr of T spine shows chronic compression deformity of L1.  X-ray of L-spine shows mild  degenerative disc disease.  No acute fracture or dislocation.  Pelvis negative for any acute fracture or dislocation.  CT head and neck shows no acute intracranial abnormality.  CT cervical spine negative for any acute abnormality.  Discussed results with patient. Updated granddaughter on results.  We will plan to discharge back to facility. At this time, patient exhibits no emergent life-threatening condition that require further evaluation in ED. Patient had ample opportunity for questions and discussion. All patient's questions were answered with full understanding. .ldcl  Portions of this note were generated with [employer]. Dictation errors may occur despite best attempts at proofreading.  Final Clinical Impression(s) / ED Diagnoses Final diagnoses:  Fall, initial encounter    Rx / DC Orders ED Discharge Orders    None       Desma Mcgregor 10/12/20 1907    Maudie Flakes, MD 10/13/20 1645

## 2020-10-12 NOTE — ED Triage Notes (Signed)
Patient brought in via EMS. She fell, hit her head, her knees, elbows and tailbone hurt. Patient is deaf but a&o. Not on any blood thinners.

## 2020-10-12 NOTE — ED Notes (Signed)
Call received from pt granddaughter Jamieson Lisa [phone] request contact for pt status/updates when possible. [employer]

## 2020-10-12 NOTE — ED Notes (Signed)
Staff attempting to call report to [facility]. Several unsuccessful attempts, will continue trying.

## 2020-10-12 NOTE — ED Notes (Signed)
[facility] called to transport pt back to SNF ([facility]).

## 2020-10-13 ENCOUNTER — Non-Acute Institutional Stay (SKILLED_NURSING_FACILITY): Payer: Medicare PPO | Admitting: Nurse Practitioner

## 2020-10-13 DIAGNOSIS — E890 Postprocedural hypothyroidism: Secondary | ICD-10-CM

## 2020-10-13 DIAGNOSIS — R634 Abnormal weight loss: Secondary | ICD-10-CM | POA: Diagnosis not present

## 2020-10-13 DIAGNOSIS — K5900 Constipation, unspecified: Secondary | ICD-10-CM | POA: Diagnosis not present

## 2020-10-13 DIAGNOSIS — R600 Localized edema: Secondary | ICD-10-CM

## 2020-10-13 DIAGNOSIS — G459 Transient cerebral ischemic attack, unspecified: Secondary | ICD-10-CM | POA: Diagnosis not present

## 2020-10-13 DIAGNOSIS — I872 Venous insufficiency (chronic) (peripheral): Secondary | ICD-10-CM | POA: Diagnosis not present

## 2020-10-13 DIAGNOSIS — M2550 Pain in unspecified joint: Secondary | ICD-10-CM

## 2020-10-13 DIAGNOSIS — R296 Repeated falls: Secondary | ICD-10-CM

## 2020-10-13 DIAGNOSIS — J449 Chronic obstructive pulmonary disease, unspecified: Secondary | ICD-10-CM | POA: Diagnosis not present

## 2020-10-13 NOTE — Assessment & Plan Note (Signed)
OA pain, R knee, X-ray negative fx 05/16/20, prn Tylenol.  

## 2020-10-13 NOTE — Assessment & Plan Note (Signed)
COPD, takes Allegra 60mg qd, DuoNebbid  

## 2020-10-13 NOTE — Assessment & Plan Note (Signed)
Edema BLE, chronic, takes Torsemide 20mg qod, 40mg qod  

## 2020-10-13 NOTE — Assessment & Plan Note (Signed)
Constipation, takes Colace 200mg qd, MOM prn, Senokot S II qhs  

## 2020-10-13 NOTE — Assessment & Plan Note (Signed)
TIA ?, Hx of CAD, takes ASA 81mg qd. 

## 2020-10-13 NOTE — Progress Notes (Signed)
Location:    [place] Room Number: 28 Place of Service:  SNF (31) Provider: Lennie Odor Neamiah Sciarra NP  [name], MD  Patient Care Team: [name], MD as PCP - General (Internal Medicine) [place] [place] Kately Graffam X, NP as Nurse Practitioner (Nurse Practitioner)  Extended Emergency Contact Information Primary Emergency Contact: [name] [place] Phone: [phone] Relation: Son Secondary Emergency Contact: [place] Phone: [phone] Mobile Phone: [phone] Relation: Daughter  Code Status: DNR Goals of care: Advanced Directive information Advanced Directives 10/12/2020  Does Patient Have a Medical Advance Directive? Yes  Type of Advance Directive Out of facility DNR (pink MOST or yellow form)  Does patient want to make changes to medical advance directive? No - Patient declined  Copy of [place] in Chart? -  Would patient like information on creating a medical advance directive? -  Pre-existing out of facility DNR order (yellow form or pink MOST form) -     Chief Complaint  Patient presents with  . Acute Visit    F/u ED eval for fall    HPI:  Pt is a 86 y.o. female seen today for an acute visit for f/u ED eval 10/12/20 for fall when the patient was found lying face up between toilet and w/c, reported an occipital hematoma, CT head/spine, X-ray pelvis T/L spines unremarkable. The patient returned to SNF [facility] in her usual state of health.   Hypothyroidism, takes Levothyroxine 181mg qd, TSH0.44 06/25/20 Constipation, takes Colace 2075mqd, MOM prn, Senokot S II qhs COPD, takes Allegra 6022md, DuoNebbid Edema BLE, chronic, takes Torsemide 62m33md, 40mg24m OA pain, R knee, X-ray negative fx 05/16/20, prn Tylenol.             TIA ?, Hx of CAD,  takes ASA 81mg 22m    Past Medical History:    Diagnosis Date  . Abnormal liver function tests    11/11/15 AST 31, ALT 88, alk phos 96   . Acute kidney failure, unspecified (HCC) [place]/2013  . Arthralgia 09/26/2014   Multiple joints: knees, shoulders, wrists, spine hips   . Arthritis   . Candidiasis of other urogenital sites 08/10/2012  . Cerebral embolism 05/23/2005  . CHF (congestive heart failure) (HCC)  [place] 11/04/2015  . Closed fracture of sacrum and coccyx without mention of spinal cord injury 02/14/2012  . Constipation 05/03/2013  . Contusion of face, scalp, and neck except eye(s) 06/01/2012  . Contusion of wrist 06/01/2012  . COPD (chronic obstructive pulmonary disease) (HCC)  [place]  . Edema 04/20/2012  . H/O: CVA (cerebrovascular accident)   . Hearing loss 09/26/2014  . History of cancer of uterus   . HTN (hypertension), benign   . Hyperglycemia 11/14/2015  . Hypothyroidism   . Insomnia, unspecified 08/10/2012  . Open wound of knee, leg (except thigh), and ankle, without mention of complication 5/9/204/1/3244her and unspecified hyperlipidemia 06/01/2012  . Other disorder of coccyx 01/27/2012  . Pain in joint, ankle and foot 06/14/2012  . Peripheral vascular disease, unspecified (HCC) [place]/2013  . Personal history of fall 01/27/2012  . Pneumonia, organism unspecified(486) 01/23/2005  . Rheumatic fever 09/26/1933   Age 88 10/01/14 ESR 13, RAF <10   . Seasonal allergies 05/03/2013  . Stasis dermatitis of both legs 09/26/2014  . Unspecified constipation 11/02/2012  . Unspecified hearing loss 02/08/2013  . Unspecified hereditary  and idiopathic peripheral neuropathy 01/27/2012  . Urinary frequency 02/24/2014  . Ventricular fibrillation ([place]) 01/24/2012   Past Surgical History:  Procedure Laterality Date  . ABDOMINAL HYSTERECTOMY  1990   for endometrial cancer  . CATARACT EXTRACTION W/ INTRAOCULAR LENS  IMPLANT, BILATERAL    . [place]  . GUM SURGERY  1932  . MASTOIDECTOMY   1920   bilateral  . THYROIDECTOMY  1960  . TONSILLECTOMY  1916    Allergies  Allergen Reactions  . Amoxicillin     Unknown, patient unable to answer questionnaire   . Aspirin Other (See Comments)    On MAR  . Avelox [Moxifloxacin]     Unknown: listed on MAR  . Erythromycin     Unknown: listed on MAR  . Monistat [Miconazole]     Unknown: listed on MAR  . Morphine And Related     Unknown: listed on MAR  . Orange Juice [Orange Oil]     Unknown: listed on MAR    Allergies as of 10/13/2020      Reactions   Amoxicillin    Unknown, patient unable to answer questionnaire    Aspirin Other (See Comments)   On MAR   Avelox [moxifloxacin]    Unknown: listed on MAR   Erythromycin    Unknown: listed on MAR   Monistat [miconazole]    Unknown: listed on MAR   Morphine And Related    Unknown: listed on MAR   Orange Juice [orange Oil]    Unknown: listed on [facility]      Medication List       Accurate as of October 13, 2020 11:59 PM. If you have any questions, ask your nurse or doctor.        acetaminophen 325 MG tablet Commonly known as: TYLENOL Take 650 mg by mouth every 4 (four) hours as needed for moderate pain.   aspirin 81 MG chewable tablet Chew 81 mg by mouth daily.   calcium citrate-vitamin D 315-200 MG-UNIT tablet Commonly known as: CITRACAL+D Take 1 tablet by mouth daily.   CEROVITE SENIOR PO Take 1 tablet by mouth daily.   docusate sodium 100 MG capsule Commonly known as: COLACE Take 200 mg by mouth at bedtime.   fexofenadine 60 MG tablet Commonly known as: ALLEGRA Take 60 mg by mouth daily.   ipratropium-albuterol 0.5-2.5 (3) MG/3ML Soln Commonly known as: DUONEB Take 3 mLs by nebulization 2 (two) times daily.   levothyroxine 150 MCG tablet Commonly known as: SYNTHROID Take 150 mcg by mouth daily.   melatonin 3 MG Tabs tablet Take 3 mg by mouth at bedtime.   OcuSoft Lid Scrub Allergy Pads Place 1 each into both eyes in the morning and at bedtime.  Cleanse bilateral lids for excess eye secretions.   potassium chloride SA 20 MEQ tablet Commonly known as: KLOR-CON Take 20 mEq by mouth daily.   Refresh Tears 0.5 % Soln Generic drug: carboxymethylcellulose Place 1 drop into both eyes 2 (two) times daily.   sennosides-docusate sodium 8.6-50 MG tablet Commonly known as: SENOKOT-S Take 2 tablets by mouth at bedtime.   torsemide 20 MG tablet Commonly known as: DEMADEX Take 40 mg by mouth every other day.   torsemide 20 MG tablet Commonly known as: DEMADEX Take 20 mg by mouth every other day.   zinc oxide 20 % ointment Apply 1 application topically daily as needed for irritation (To buttocks after every incontinent episode and for redness.).  Review of Systems  Constitutional: Positive for unexpected weight change. Negative for activity change, appetite change and fever.       Weight loss about #5Ibs in the past 2weeks.   HENT: Positive for hearing loss. Negative for congestion and voice change.   Eyes: Negative for visual disturbance.  Respiratory: Positive for shortness of breath. Negative for cough.        DOE  Cardiovascular: Positive for leg swelling.  Gastrointestinal: Negative for abdominal pain and constipation.  Genitourinary: Negative for difficulty urinating, dysuria and urgency.  Musculoskeletal: Positive for arthralgias and gait problem.  Skin: Negative for color change and wound.  Neurological: Negative for speech difficulty, weakness, light-headedness and headaches.       Memory lapses.   Psychiatric/Behavioral: Negative for behavioral problems and sleep disturbance. The patient is not nervous/anxious.     Immunization History  Administered Date(s) Administered  . Influenza Whole 10/19/2012, 09/15/2018  . Influenza, High Dose Seasonal PF 09/14/2019  . Influenza-Unspecified 10/18/2013, 10/17/2014, 09/02/2015, 09/29/2016, 10/03/2017, 09/23/2020  . Moderna SARS-COVID-2 Vaccination 12/15/2019, 01/12/2020   . PPD Test 01/22/2013, 11/28/2015  . Pneumococcal Conjugate-13 11/18/2017  . Pneumococcal Polysaccharide-23 01/22/2013  . Td 02/21/2009  . Tdap 04/07/2015   Pertinent  Health Maintenance Due  Topic Date Due  . INFLUENZA VACCINE  Completed  . PNA vac Low Risk Adult  Completed   Fall Risk  09/05/2018 09/02/2017 11/14/2015 04/17/2015 09/26/2014  Falls in the past year? Yes No No Yes No  Number falls in past yr: 2 or more - - 1 -  Injury with Fall? Yes - - Yes -  Comment - - - laceration forehead 04/07/15 -  Risk Factor Category  - - - High Fall Risk -  Risk for fall due to : - - Impaired mobility;Impaired vision - -   Functional Status Survey:    Vitals:   10/13/20 1514  BP: 126/68  Pulse: 80  Resp: 20  Temp: (!) 97 F (36.1 C)  SpO2: 94%  Weight: 136 lb 8 oz (61.9 kg)  Height: 5' (1.524 m)   Body mass index is 26.66 kg/m. Physical Exam Vitals and nursing note reviewed.  Constitutional:      Appearance: Normal appearance.  HENT:     Head: Normocephalic and atraumatic.     Mouth/Throat:     Mouth: Mucous membranes are moist.  Eyes:     Extraocular Movements: Extraocular movements intact.     Pupils: Pupils are equal, round, and reactive to light.  Cardiovascular:     Rate and Rhythm: Normal rate and regular rhythm.     Heart sounds: No murmur heard.   Pulmonary:     Breath sounds: Rales present.     Comments: Bibasilar rales chronic Abdominal:     General: Bowel sounds are normal.     Palpations: Abdomen is soft.     Tenderness: There is no abdominal tenderness.  Musculoskeletal:     Cervical back: Normal range of motion and neck supple.     Right lower leg: Edema present.     Left lower leg: Edema present.     Comments: Trace edema BLE  Skin:    General: Skin is warm and dry.     Comments: BLE sensitive to touch as usual. No scalp hematoma upon my examination.   Neurological:     General: No focal deficit present.     Mental Status: She is alert. Mental  status is at baseline.     Gait:  Gait abnormal.     Comments: Oriented to person, place.   Psychiatric:        Mood and Affect: Mood normal.        Behavior: Behavior normal.     Labs reviewed: Recent Labs    03/11/20 0000 06/06/20 0000 06/25/20 0000  NA 137 139 141  K 4.2 3.8 4.1  CL 99 99 99  CO2 30* 31* 31*  BUN 36* 36* 34*  CREATININE 1.1 1.1 1.2*  CALCIUM 8.6* 8.8 9.3   Recent Labs    03/11/20 0000 06/06/20 0000 06/25/20 0000  AST 20 15 17   ALT 16 12 12   ALKPHOS 63 63 65  ALBUMIN 3.3* 3.2* 3.5   Recent Labs    03/11/20 0000 06/06/20 0000 06/25/20 0000  WBC 7.7 7.9 10.0  NEUTROABS 4,312 4,511 5,880  HGB 13.2 12.5 12.9  HCT 40 37 40  PLT 158 184 194   Lab Results  Component Value Date   TSH 0.44 06/25/2020   No results found for: HGBA1C No results found for: CHOL, HDL, LDLCALC, LDLDIRECT, TRIG, CHOLHDL  Significant Diagnostic Results in last 30 days:  DG Thoracic Spine 2 View  Result Date: 10/12/2020 CLINICAL DATA:  Pain post fall. EXAM: THORACIC SPINE 2 VIEWS COMPARISON:  None. FINDINGS: There is no evidence of thoracic spine fracture. Superior endplate compression deformity of L1 vertebral body with approximately 30% height loss, chronic from 2016. Thoracic kyphosis. Calcific atherosclerotic disease of the aorta and tortuosity. IMPRESSION: Superior endplate compression deformity of L1 vertebral body with approximately 30% height loss, chronic since 2016. No evidence of thoracic spine fracture. Electronically Signed   By: Fidela Salisbury M.D.   On: 10/12/2020 13:12   DG Lumbar Spine Complete  Result Date: 10/12/2020 CLINICAL DATA:  Pain after fall. EXAM: LUMBAR SPINE - COMPLETE 4+ VIEW COMPARISON:  MRI February 04, 2012 FINDINGS: A compression fracture seen at the L1 level, similar in appearance since the February 04, 2012 MRI. No acute fractures are seen. Lower lumbar facet degenerative changes. Mild multilevel degenerative disc disease. Calcified  atherosclerosis in the abdominal aorta. No other acute abnormalities. IMPRESSION: 1. No acute fracture or traumatic malalignment. Chronic compression fracture of L1. 2. Mild degenerative disc disease and lower lumbar facet degenerative changes. Electronically Signed   By: Dorise Bullion III M.D   On: 10/12/2020 13:11   DG Pelvis 1-2 Views  Result Date: 10/12/2020 CLINICAL DATA:  Fall with pain EXAM: PELVIS - 1-2 VIEW COMPARISON:  CT abdomen pelvis dated 11/04/2015. FINDINGS: There is no evidence of pelvic fracture or diastasis. Mild to moderate degenerative changes in both hips. Severe degenerative changes in the lumbar spine. IMPRESSION: No acute fracture or dislocation. Electronically Signed   By: Zerita Boers M.D.   On: 10/12/2020 13:22   CT Head Wo Contrast  Result Date: 10/12/2020 CLINICAL DATA:  Fall with head and neck pain. EXAM: CT HEAD WITHOUT CONTRAST CT CERVICAL SPINE WITHOUT CONTRAST TECHNIQUE: Multidetector CT imaging of the head and cervical spine was performed following the standard protocol without intravenous contrast. Multiplanar CT image reconstructions of the cervical spine were also generated. COMPARISON:  CT head and cervical spine dated 08/17/2018. FINDINGS: CT HEAD FINDINGS Brain: No evidence of acute infarction, hemorrhage, hydrocephalus, extra-axial collection or mass lesion/mass effect. There is moderate cerebral volume loss with associated ex vacuo dilatation. Periventricular white matter hypoattenuation likely represents chronic small vessel ischemic disease. Vascular: There are vascular calcifications in the carotid siphons. Skull: Postsurgical changes from prior  bilateral mastoidectomies. No acute fracture. Sinuses/Orbits: There are bilateral mastoid effusions. Other: None. CT CERVICAL SPINE FINDINGS Alignment: Normal. Skull base and vertebrae: No acute fracture. No primary bone lesion or focal pathologic process. Soft tissues and spinal canal: No prevertebral fluid or  swelling. No visible canal hematoma. Disc levels:  Severe multilevel degenerative disc and joint disease. Upper chest: Negative. Other: None IMPRESSION: 1. No acute intracranial process. 2. No acute osseous injury in the cervical spine. 3. Bilateral mastoid effusions. Electronically Signed   By: Zerita Boers M.D.   On: 10/12/2020 13:20   CT Cervical Spine Wo Contrast  Result Date: 10/12/2020 CLINICAL DATA:  Fall with head and neck pain. EXAM: CT HEAD WITHOUT CONTRAST CT CERVICAL SPINE WITHOUT CONTRAST TECHNIQUE: Multidetector CT imaging of the head and cervical spine was performed following the standard protocol without intravenous contrast. Multiplanar CT image reconstructions of the cervical spine were also generated. COMPARISON:  CT head and cervical spine dated 08/17/2018. FINDINGS: CT HEAD FINDINGS Brain: No evidence of acute infarction, hemorrhage, hydrocephalus, extra-axial collection or mass lesion/mass effect. There is moderate cerebral volume loss with associated ex vacuo dilatation. Periventricular white matter hypoattenuation likely represents chronic small vessel ischemic disease. Vascular: There are vascular calcifications in the carotid siphons. Skull: Postsurgical changes from prior bilateral mastoidectomies. No acute fracture. Sinuses/Orbits: There are bilateral mastoid effusions. Other: None. CT CERVICAL SPINE FINDINGS Alignment: Normal. Skull base and vertebrae: No acute fracture. No primary bone lesion or focal pathologic process. Soft tissues and spinal canal: No prevertebral fluid or swelling. No visible canal hematoma. Disc levels:  Severe multilevel degenerative disc and joint disease. Upper chest: Negative. Other: None IMPRESSION: 1. No acute intracranial process. 2. No acute osseous injury in the cervical spine. 3. Bilateral mastoid effusions. Electronically Signed   By: Zerita Boers M.D.   On: 10/12/2020 13:20    Assessment/Plan: Frequent falls f/u ED eval 10/12/20 for fall  when the patient was found lying face up between toilet and w/c, reported an occipital hematoma, CT head/spine, X-ray pelvis T/L spines unremarkable. The patient returned to SNF [facility] in her usual state of health.  No scalp hematoma noted upon my examination today. Continue supervision/assistance for safety, her safety awareness is limited, her gait is unstable.   Hypothyroidism Hypothyroidism, takes Levothyroxine 14mg qd, TSH0.44 06/25/20   Constipation Constipation, takes Colace 2072mqd, MOM prn, Senokot S II qhs   COPD, mild (HCC) COPD, takes Allegra 6050md, DuoNebbid   Edema of both lower legs due to peripheral venous insufficiency Edema BLE, chronic, takes Torsemide 24m58md, 40mg28m   Arthralgia OA pain, R knee, X-ray negative fx 05/16/20, prn Tylenol.   TIA (transient ischemic attack) TIA ?, Hx of CAD,  takes ASA 81mg 46m    Weight loss About #5Ibs in the past 2 weeks, will update CBC/diff, CMP/eGFR, TSH.     Family/ staff Communication: plan of care reviewed with the patient and charge nurse.   Labs/tests ordered:  CBC/diff, CMP/eGFR, TSH   Time spend 35 minutes.

## 2020-10-13 NOTE — Assessment & Plan Note (Signed)
Hypothyroidism, takes Levothyroxine 150mcg qd, TSH0.44 06/25/20  

## 2020-10-13 NOTE — Assessment & Plan Note (Addendum)
f/u ED eval 10/12/20 for fall when the patient was found lying face up between toilet and w/c, reported an occipital hematoma, CT head/spine, X-ray pelvis T/L spines unremarkable. The patient returned to SNF [facility] in her usual state of health.  No scalp hematoma noted upon my examination today. Continue supervision/assistance for safety, her safety awareness is limited, her gait is unstable.

## 2020-10-14 ENCOUNTER — Encounter: Payer: Self-pay | Admitting: Nurse Practitioner

## 2020-10-14 DIAGNOSIS — R634 Abnormal weight loss: Secondary | ICD-10-CM | POA: Insufficient documentation

## 2020-10-14 NOTE — Assessment & Plan Note (Signed)
About #5Ibs in the past 2 weeks, will update CBC/diff, CMP/eGFR, TSH.

## 2020-10-16 DIAGNOSIS — I10 Essential (primary) hypertension: Secondary | ICD-10-CM | POA: Diagnosis not present

## 2020-10-17 ENCOUNTER — Encounter: Payer: Self-pay | Admitting: Internal Medicine

## 2020-10-17 ENCOUNTER — Non-Acute Institutional Stay (SKILLED_NURSING_FACILITY): Payer: Medicare PPO | Admitting: Internal Medicine

## 2020-10-17 DIAGNOSIS — R296 Repeated falls: Secondary | ICD-10-CM | POA: Diagnosis not present

## 2020-10-17 DIAGNOSIS — I872 Venous insufficiency (chronic) (peripheral): Secondary | ICD-10-CM | POA: Diagnosis not present

## 2020-10-17 DIAGNOSIS — E890 Postprocedural hypothyroidism: Secondary | ICD-10-CM | POA: Diagnosis not present

## 2020-10-17 DIAGNOSIS — R7989 Other specified abnormal findings of blood chemistry: Secondary | ICD-10-CM | POA: Diagnosis not present

## 2020-10-17 DIAGNOSIS — R600 Localized edema: Secondary | ICD-10-CM

## 2020-10-17 LAB — COMPREHENSIVE METABOLIC PANEL
Albumin: 3.8 (ref 3.5–5.0)
Calcium: 9.4 (ref 8.7–10.7)
Globulin: 2.9

## 2020-10-17 LAB — BASIC METABOLIC PANEL
BUN: 33 — AB (ref 4–21)
CO2: 30 — AB (ref 13–22)
Chloride: 97 — AB (ref 99–108)
Creatinine: 1.1 (ref <=1.1)
Glucose: 110
Potassium: 4.3 (ref 3.4–5.3)
Sodium: 140 (ref 137–147)

## 2020-10-17 LAB — CBC AND DIFFERENTIAL
HCT: 44 (ref 36–46)
Hemoglobin: 14.4 (ref 12.0–16.0)
Neutrophils Absolute: 2520
Platelets: 220 (ref 150–399)
WBC: 5

## 2020-10-17 LAB — HEPATIC FUNCTION PANEL
ALT: 299 — AB (ref 7–35)
AST: 299 — AB (ref 13–35)
Alkaline Phosphatase: 160 — AB (ref 25–125)
Bilirubin, Total: 1.7

## 2020-10-17 LAB — TSH: TSH: 0.47 (ref 0.41–5.90)

## 2020-10-17 LAB — CBC: RBC: 5.02 (ref 3.87–5.11)

## 2020-10-17 NOTE — Progress Notes (Signed)
Location:   [facility] Room Number: 28 Place of Service:  SNF (31) Provider:  ,  MD  ,  L, MD  Patient Care Team: ,  L, MD as PCP - General (Internal Medicine) [place], Man X, NP as Nurse Practitioner (Nurse Practitioner)  Extended Emergency Contact Information Primary Emergency Contact: Marton,Samuel  [place] Home Phone: [phone] Relation: Son Secondary Emergency Contact: Graf-Kumari,Jean Home Phone: [phone] Mobile Phone: [phone] Relation: Daughter  Code Status:  DNR Goals of care: Advanced Directive information Advanced Directives 10/12/2020  Does Patient Have a Medical Advance Directive? Yes  Type of Advance Directive Out of facility DNR (pink MOST or yellow form)  Does patient want to make changes to medical advance directive? No - Patient declined  Copy of Healthcare Power of Attorney in Chart? -  Would patient like information on creating a medical advance directive? -  Pre-existing out of facility DNR order (yellow form or pink MOST form) -     Chief Complaint  Patient presents with  . Acute Visit    Hepatitis    HPI:  Pt is a 86 y.o. female seen today for an acute visit for Elevated LFTS  Patient has h/o Bilateral LE edema , Hypertension, Hard of hearing, h/o Cellulitis, Hypothyroidism, COPD, Chronic Conjunctivitis  Long term Resident Sleeps on her Recliner Patient had a regular labs done after her fall. The labs came back today which shows increased AST 870 ALT1390 bilirubin 1.7 Alk phos is 160 Patient is completely asymptomatic no fever chills nausea vomiting abdominal pain.  Per nurses she has been at her baseline Eating     Past Medical History:  Diagnosis Date  . Abnormal liver function tests    11/11/15 AST 31, ALT 88, alk phos 96   . Acute kidney failure, unspecified (HCC) 01/24/2012  . Arthralgia 09/26/2014     Multiple joints: knees, shoulders, wrists, spine hips   . Arthritis   . Candidiasis of other urogenital sites 08/10/2012  . Cerebral embolism 05/23/2005  . CHF (congestive heart failure) (HCC)   . Cholelithiasis 11/04/2015  . Closed fracture of sacrum and coccyx without mention of spinal cord injury 02/14/2012  . Constipation 05/03/2013  . Contusion of face, scalp, and neck except eye(s) 06/01/2012  . Contusion of wrist 06/01/2012  . COPD (chronic obstructive pulmonary disease) (HCC)   . COPD, mild (HCC) 01/20/2013  . Edema 04/20/2012  . H/O: CVA (cerebrovascular accident)   . Hearing loss 09/26/2014  . History of cancer of uterus   . HTN (hypertension), benign   . Hyperglycemia 11/14/2015  . Hypothyroidism   . Insomnia, unspecified 08/10/2012  . Open wound of knee, leg (except thigh), and ankle, without mention of complication 04/20/2012  . Other and unspecified hyperlipidemia 06/01/2012  . Other disorder of coccyx 01/27/2012  . Pain in joint, ankle and foot 06/14/2012  . Peripheral vascular disease, unspecified (HCC) 01/27/2012  . Personal history of fall 01/27/2012  . Pneumonia, organism unspecified(486) 01/23/2005  . Rheumatic fever 09/26/1933   Age 22 10/01/14 ESR 13, RAF <10   . Seasonal allergies 05/03/2013  . Stasis dermatitis of both legs 09/26/2014  . Unspecified constipation 11/02/2012  . Unspecified hearing loss 02/08/2013  . Unspecified hereditary and idiopathic peripheral neuropathy 01/27/2012  . Urinary frequency 02/24/2014  . Ventricular fibrillation (HCC) 01/24/2012   Past Surgical History:  Procedure Laterality Date  . ABDOMINAL HYSTERECTOMY  1990   for endometrial cancer  .   CATARACT EXTRACTION W/ INTRAOCULAR LENS  IMPLANT, BILATERAL    . ECTOPIC PREGNANCY SURGERY  1952  . GUM SURGERY  1932  . MASTOIDECTOMY  1920   bilateral  . THYROIDECTOMY  1960  . TONSILLECTOMY  1916    Allergies  Allergen Reactions  . Amoxicillin     Unknown, patient unable to answer questionnaire    . Aspirin Other (See Comments)    On MAR  . Avelox [Moxifloxacin]     Unknown: listed on MAR  . Erythromycin     Unknown: listed on MAR  . Monistat [Miconazole]     Unknown: listed on MAR  . Morphine And Related     Unknown: listed on MAR  . Orange Juice [Orange Oil]     Unknown: listed on MAR    Allergies as of 10/17/2020      Reactions   Amoxicillin    Unknown, patient unable to answer questionnaire    Aspirin Other (See Comments)   On MAR   Avelox [moxifloxacin]    Unknown: listed on MAR   Erythromycin    Unknown: listed on MAR   Monistat [miconazole]    Unknown: listed on MAR   Morphine And Related    Unknown: listed on MAR   Orange Juice [orange Oil]    Unknown: listed on MAR      Medication List       Accurate as of October 17, 2020  3:28 PM. If you have any questions, ask your nurse or doctor.        acetaminophen 325 MG tablet Commonly known as: TYLENOL Take 650 mg by mouth every 4 (four) hours as needed for moderate pain.   aspirin 81 MG chewable tablet Chew 81 mg by mouth daily.   calcium citrate-vitamin D 315-200 MG-UNIT tablet Commonly known as: CITRACAL+D Take 1 tablet by mouth daily.   CEROVITE SENIOR PO Take 1 tablet by mouth daily.   docusate sodium 100 MG capsule Commonly known as: COLACE Take 200 mg by mouth at bedtime.   fexofenadine 60 MG tablet Commonly known as: ALLEGRA Take 60 mg by mouth daily.   ipratropium-albuterol 0.5-2.5 (3) MG/3ML Soln Commonly known as: DUONEB Take 3 mLs by nebulization 2 (two) times daily.   levothyroxine 150 MCG tablet Commonly known as: SYNTHROID Take 150 mcg by mouth daily.   melatonin 3 MG Tabs tablet Take 3 mg by mouth at bedtime.   OcuSoft Lid Scrub Allergy Pads Place 1 each into both eyes in the morning and at bedtime. Cleanse bilateral lids for excess eye secretions.   potassium chloride SA 20 MEQ tablet Commonly known as: KLOR-CON Take 20 mEq by mouth daily.   Refresh Tears 0.5 %  Soln Generic drug: carboxymethylcellulose Place 1 drop into both eyes 2 (two) times daily.   sennosides-docusate sodium 8.6-50 MG tablet Commonly known as: SENOKOT-S Take 2 tablets by mouth at bedtime.   torsemide 20 MG tablet Commonly known as: DEMADEX Take 40 mg by mouth every other day.   torsemide 20 MG tablet Commonly known as: DEMADEX Take 20 mg by mouth every other day.   zinc oxide 20 % ointment Apply 1 application topically daily as needed for irritation (To buttocks after every incontinent episode and for redness.).       Review of Systems  Constitutional: Negative.   HENT: Negative.   Respiratory: Negative.   Cardiovascular: Negative.   Gastrointestinal: Negative for abdominal distention, abdominal pain, constipation, diarrhea, nausea and vomiting.  Genitourinary:   Negative.   Musculoskeletal: Positive for gait problem.  Neurological: Positive for weakness.  Psychiatric/Behavioral: Negative.     Very Hard of hearing  Immunization History  Administered Date(s) Administered  . Influenza Whole 10/19/2012, 09/15/2018  . Influenza, High Dose Seasonal PF 09/14/2019  . Influenza-Unspecified 10/18/2013, 10/17/2014, 09/02/2015, 09/29/2016, 10/03/2017, 09/23/2020  . Moderna SARS-COVID-2 Vaccination 12/15/2019, 01/12/2020  . PPD Test 01/22/2013, 11/28/2015  . Pneumococcal Conjugate-13 11/18/2017  . Pneumococcal Polysaccharide-23 01/22/2013  . Td 02/21/2009  . Tdap 04/07/2015   Pertinent  Health Maintenance Due  Topic Date Due  . INFLUENZA VACCINE  Completed  . PNA vac Low Risk Adult  Completed   Fall Risk  09/05/2018 09/02/2017 11/14/2015 04/17/2015 09/26/2014  Falls in the past year? Yes No No Yes No  Number falls in past yr: 2 or more - - 1 -  Injury with Fall? Yes - - Yes -  Comment - - - laceration forehead 04/07/15 -  Risk Factor Category  - - - High Fall Risk -  Risk for fall due to : - - Impaired mobility;Impaired vision - -   Functional Status Survey:     Vitals:   10/17/20 1519  BP: 130/60  Pulse: 80  Resp: 18  Temp: (!) 96.9 F (36.1 C)  SpO2: 94%  Weight: 136 lb 8 oz (61.9 kg)  Height: 5' (1.524 m)   Body mass index is 26.66 kg/m. Physical Exam Vitals reviewed.  Constitutional:      Appearance: Normal appearance.  HENT:     Head: Normocephalic.     Nose: Nose normal.     Mouth/Throat:     Mouth: Mucous membranes are moist.     Pharynx: Oropharynx is clear.  Eyes:     Pupils: Pupils are equal, round, and reactive to light.  Cardiovascular:     Rate and Rhythm: Normal rate and regular rhythm.     Pulses: Normal pulses.  Pulmonary:     Effort: Pulmonary effort is normal.     Breath sounds: Normal breath sounds.  Abdominal:     General: Abdomen is flat. Bowel sounds are normal. There is no distension.     Palpations: Abdomen is soft.     Tenderness: There is no abdominal tenderness. There is no guarding.  Musculoskeletal:        General: Swelling present.     Cervical back: Neck supple.  Skin:    General: Skin is warm.  Neurological:     General: No focal deficit present.     Mental Status: She is alert.  Psychiatric:        Mood and Affect: Mood normal.        Thought Content: Thought content normal.     Labs reviewed: Recent Labs    06/06/20 0000 06/25/20 0000 10/17/20 0000  NA 139 141 140  K 3.8 4.1 4.3  CL 99 99 97*  CO2 31* 31* 30*  BUN 36* 34* 33*  CREATININE 1.1 1.2* 1.1  CALCIUM 8.8 9.3 9.4   Recent Labs    06/06/20 0000 06/25/20 0000 10/17/20 0000  AST 15 17 299*  ALT 12 12 299*  ALKPHOS 63 65 160*  ALBUMIN 3.2* 3.5 3.8   Recent Labs    06/06/20 0000 06/25/20 0000 10/17/20 0000  WBC 7.9 10.0 5.0  NEUTROABS 4,511 5,880 2,520.00  HGB 12.5 12.9 14.4  HCT 37 40 44  PLT 184 194 220   Lab Results  Component Value Date   TSH 0.47   10/17/2020   No results found for: HGBA1C No results found for: CHOL, HDL, LDLCALC, LDLDIRECT, TRIG, CHOLHDL  Significant Diagnostic Results in  last 30 days:  DG Thoracic Spine 2 View  Result Date: 10/12/2020 CLINICAL DATA:  Pain post fall. EXAM: THORACIC SPINE 2 VIEWS COMPARISON:  None. FINDINGS: There is no evidence of thoracic spine fracture. Superior endplate compression deformity of L1 vertebral body with approximately 30% height loss, chronic from 2016. Thoracic kyphosis. Calcific atherosclerotic disease of the aorta and tortuosity. IMPRESSION: Superior endplate compression deformity of L1 vertebral body with approximately 30% height loss, chronic since 2016. No evidence of thoracic spine fracture. Electronically Signed   By: Fidela Salisbury M.D.   On: 10/12/2020 13:12   DG Lumbar Spine Complete  Result Date: 10/12/2020 CLINICAL DATA:  Pain after fall. EXAM: LUMBAR SPINE - COMPLETE 4+ VIEW COMPARISON:  MRI February 04, 2012 FINDINGS: A compression fracture seen at the L1 level, similar in appearance since the February 04, 2012 MRI. No acute fractures are seen. Lower lumbar facet degenerative changes. Mild multilevel degenerative disc disease. Calcified atherosclerosis in the abdominal aorta. No other acute abnormalities. IMPRESSION: 1. No acute fracture or traumatic malalignment. Chronic compression fracture of L1. 2. Mild degenerative disc disease and lower lumbar facet degenerative changes. Electronically Signed   By: Dorise Bullion III M.D   On: 10/12/2020 13:11   DG Pelvis 1-2 Views  Result Date: 10/12/2020 CLINICAL DATA:  Fall with pain EXAM: PELVIS - 1-2 VIEW COMPARISON:  CT abdomen pelvis dated 11/04/2015. FINDINGS: There is no evidence of pelvic fracture or diastasis. Mild to moderate degenerative changes in both hips. Severe degenerative changes in the lumbar spine. IMPRESSION: No acute fracture or dislocation. Electronically Signed   By: Zerita Boers M.D.   On: 10/12/2020 13:22   CT Head Wo Contrast  Result Date: 10/12/2020 CLINICAL DATA:  Fall with head and neck pain. EXAM: CT HEAD WITHOUT CONTRAST CT CERVICAL SPINE  WITHOUT CONTRAST TECHNIQUE: Multidetector CT imaging of the head and cervical spine was performed following the standard protocol without intravenous contrast. Multiplanar CT image reconstructions of the cervical spine were also generated. COMPARISON:  CT head and cervical spine dated 08/17/2018. FINDINGS: CT HEAD FINDINGS Brain: No evidence of acute infarction, hemorrhage, hydrocephalus, extra-axial collection or mass lesion/mass effect. There is moderate cerebral volume loss with associated ex vacuo dilatation. Periventricular white matter hypoattenuation likely represents chronic small vessel ischemic disease. Vascular: There are vascular calcifications in the carotid siphons. Skull: Postsurgical changes from prior bilateral mastoidectomies. No acute fracture. Sinuses/Orbits: There are bilateral mastoid effusions. Other: None. CT CERVICAL SPINE FINDINGS Alignment: Normal. Skull base and vertebrae: No acute fracture. No primary bone lesion or focal pathologic process. Soft tissues and spinal canal: No prevertebral fluid or swelling. No visible canal hematoma. Disc levels:  Severe multilevel degenerative disc and joint disease. Upper chest: Negative. Other: None IMPRESSION: 1. No acute intracranial process. 2. No acute osseous injury in the cervical spine. 3. Bilateral mastoid effusions. Electronically Signed   By: Zerita Boers M.D.   On: 10/12/2020 13:20   CT Cervical Spine Wo Contrast  Result Date: 10/12/2020 CLINICAL DATA:  Fall with head and neck pain. EXAM: CT HEAD WITHOUT CONTRAST CT CERVICAL SPINE WITHOUT CONTRAST TECHNIQUE: Multidetector CT imaging of the head and cervical spine was performed following the standard protocol without intravenous contrast. Multiplanar CT image reconstructions of the cervical spine were also generated. COMPARISON:  CT head and cervical spine dated 08/17/2018. FINDINGS: CT HEAD FINDINGS Brain:  No evidence of acute infarction, hemorrhage, hydrocephalus, extra-axial  collection or mass lesion/mass effect. There is moderate cerebral volume loss with associated ex vacuo dilatation. Periventricular white matter hypoattenuation likely represents chronic small vessel ischemic disease. Vascular: There are vascular calcifications in the carotid siphons. Skull: Postsurgical changes from prior bilateral mastoidectomies. No acute fracture. Sinuses/Orbits: There are bilateral mastoid effusions. Other: None. CT CERVICAL SPINE FINDINGS Alignment: Normal. Skull base and vertebrae: No acute fracture. No primary bone lesion or focal pathologic process. Soft tissues and spinal canal: No prevertebral fluid or swelling. No visible canal hematoma. Disc levels:  Severe multilevel degenerative disc and joint disease. Upper chest: Negative. Other: None IMPRESSION: 1. No acute intracranial process. 2. No acute osseous injury in the cervical spine. 3. Bilateral mastoid effusions. Electronically Signed   By: Tyler  Litton M.D.   On: 10/12/2020 13:20    Assessment/Plan Elevated LFTs ? Etiology  Exam Normal AST 1390 CBC Normal  ALT 870 Alk phos 160 Billi 1.7 Will Repeat Labs Discontinue Tylenol RUQ US ordered   Frequent falls Continue Supportive care  Postoperative hypothyroidism TSH Normal level No change in Dose Edema of both lower legs due to peripheral venous insufficiency Continue Demadex Repeat Creat good levels  ? TIA On Aspirin No aggressive treatment Constipation On Senna  Family/ staff Communication:   Labs/tests ordered:   

## 2020-10-20 DIAGNOSIS — K8020 Calculus of gallbladder without cholecystitis without obstruction: Secondary | ICD-10-CM | POA: Diagnosis not present

## 2020-10-20 DIAGNOSIS — I10 Essential (primary) hypertension: Secondary | ICD-10-CM | POA: Diagnosis not present

## 2020-10-20 LAB — COMPREHENSIVE METABOLIC PANEL
Albumin: 3.8 (ref 3.5–5.0)
Globulin: 2.8

## 2020-10-20 LAB — HEPATIC FUNCTION PANEL
ALT: 299 — AB (ref 7–35)
AST: 99 — AB (ref 13–35)
Alkaline Phosphatase: 171 — AB (ref 25–125)
Bilirubin, Direct: 0.2 (ref 0.01–0.4)
Bilirubin, Total: 0.7

## 2020-10-29 ENCOUNTER — Encounter: Payer: Self-pay | Admitting: Nurse Practitioner

## 2020-10-29 ENCOUNTER — Non-Acute Institutional Stay (SKILLED_NURSING_FACILITY): Payer: Medicare PPO | Admitting: Nurse Practitioner

## 2020-10-29 DIAGNOSIS — K5900 Constipation, unspecified: Secondary | ICD-10-CM | POA: Diagnosis not present

## 2020-10-29 DIAGNOSIS — I872 Venous insufficiency (chronic) (peripheral): Secondary | ICD-10-CM

## 2020-10-29 DIAGNOSIS — G459 Transient cerebral ischemic attack, unspecified: Secondary | ICD-10-CM | POA: Diagnosis not present

## 2020-10-29 DIAGNOSIS — E890 Postprocedural hypothyroidism: Secondary | ICD-10-CM | POA: Diagnosis not present

## 2020-10-29 DIAGNOSIS — K8018 Calculus of gallbladder with other cholecystitis without obstruction: Secondary | ICD-10-CM

## 2020-10-29 DIAGNOSIS — M2550 Pain in unspecified joint: Secondary | ICD-10-CM | POA: Diagnosis not present

## 2020-10-29 DIAGNOSIS — J449 Chronic obstructive pulmonary disease, unspecified: Secondary | ICD-10-CM | POA: Diagnosis not present

## 2020-10-29 DIAGNOSIS — R600 Localized edema: Secondary | ICD-10-CM

## 2020-10-29 NOTE — Assessment & Plan Note (Signed)
COPD, takes Allegra 60mg qd, DuoNebbid  

## 2020-10-29 NOTE — Assessment & Plan Note (Signed)
Constipation, takes Colace 200mg qd, MOM prn, Senokot S II qhs  

## 2020-10-29 NOTE — Assessment & Plan Note (Signed)
OA pain, R knee, X-ray negative fx 05/16/20, prn Tylenol.  

## 2020-10-29 NOTE — Progress Notes (Signed)
Location:   [facility] Room Number: 28 Place of Service:  SNF (31) Provider:  Marda Stalker, Lennie Odor NP  [name], MD  Patient Care Team: [name], MD as PCP - General (Internal Medicine) [facility] Marilyn Nihiser X, NP as Nurse Practitioner (Nurse Practitioner)  Extended Emergency Contact Information Primary Emergency Contact: [name] [place] of [place] Phone: [phone] Relation: Son Secondary Emergency Contact: [place] Phone: [phone] Mobile Phone: [phone] Relation: Daughter  Code Status:  DNR Goals of care: Advanced Directive information Advanced Directives 10/29/2020  Does Patient Have a Medical Advance Directive? Yes  Type of Advance Directive Out of facility DNR (pink MOST or yellow form);Living will;Healthcare Power of Attorney  Does patient want to make changes to medical advance directive? No - Patient declined  Copy of [place] in Chart? Yes - validated most recent copy scanned in chart (See row information)  Would patient like information on creating a medical advance directive? -  Pre-existing out of facility DNR order (yellow form or pink MOST form) Pink MOST form placed in chart (order not valid for inpatient use);Yellow form placed in chart (order not valid for inpatient use)     Chief Complaint  Patient presents with   Medical Management of Chronic Issues    HPI:  Pt is a 86 y.o. female seen today for medical management of chronic diseases.      Hypothyroidism, takes Levothyroxine 193mg qd, TSH0.44 06/25/20 Constipation, takes Colace 2031mqd, MOM prn, Senokot S II qhs COPD, takes Allegra 6061md, DuoNebbid Edema BLE, chronic, takes Torsemide 56m62md, 40mg16m OA pain, R knee, X-ray negative fx 05/16/20, prn Tylenol. TIA ?, Hx of CAD, takes ASA 81mg 77m  Elevated  LFT off Tylenol, RUQ US, 11[place]/21 US abdKoreaultiple gallstones in the gallbladder, largest 0.8cm.   repeated AST 99(was 299), ALT 299 again, total bilirubin 0.7(was 1.7), Alk phos 171(was 160)  Past Medical History:  Diagnosis Date   Abnormal liver function tests    11/11/15 AST 31, ALT 88, alk phos 96    Acute kidney failure, unspecified (HCC) 2[place]/2013   Arthralgia 09/26/2014   Multiple joints: knees, shoulders, wrists, spine hips    Arthritis    Candidiasis of other urogenital sites 08/10/2012   Cerebral embolism 05/23/2005   CHF (congestive heart failure) (HCC)  [place]leholelithiasis 11/04/2015   Closed fracture of sacrum and coccyx without mention of spinal cord injury 02/14/2012   Constipation 05/03/2013   Contusion of face, scalp, and neck except eye(s) 06/01/2012   Contusion of wrist 06/01/2012   COPD (chronic obstructive pulmonary disease) (HCC)    COPD, mild (HCC) 2[place]2014   Edema 04/20/2012   H/O: CVA (cerebrovascular accident)    Hearing loss 09/26/2014   History of cancer of uterus    HTN (hypertension), benign    Hyperglycemia 11/14/2015   Hypothyroidism    Insomnia, unspecified 08/10/2012   Open wound of knee, leg (except thigh), and ankle, without mention of complication 5/9/204/2/6834er and unspecified hyperlipidemia 06/01/2012   Other disorder of coccyx 01/27/2012   Pain in joint, ankle and foot 06/14/2012   Peripheral vascular disease, unspecified (HCC) 2[employer] [facility] [employer]/2013   Personal history of fall 01/27/2012   Pneumonia, organism unspecified(486) 01/23/2005   Rheumatic fever 09/26/1933   Age 79 10/01/14 ESR 13, RAF <10    Seasonal allergies 05/03/2013   Stasis dermatitis of both legs 09/26/2014  Unspecified constipation 11/02/2012   Unspecified hearing loss 02/08/2013   Unspecified hereditary and idiopathic peripheral neuropathy 01/27/2012   Urinary frequency 02/24/2014   Ventricular fibrillation ([place]) 01/24/2012   Past Surgical History:  Procedure  Laterality Date   ABDOMINAL HYSTERECTOMY  1990   for endometrial cancer   CATARACT EXTRACTION W/ INTRAOCULAR LENS  IMPLANT, BILATERAL     ECTOPIC PREGNANCY SURGERY  1952   GUM SURGERY  1932   MASTOIDECTOMY  1920   bilateral   THYROIDECTOMY  1960   TONSILLECTOMY  1916    Allergies  Allergen Reactions   Amoxicillin     Unknown, patient unable to answer questionnaire    Aspirin Other (See Comments)    On MAR   Avelox [Moxifloxacin]     Unknown: listed on MAR   Erythromycin     Unknown: listed on MAR   Monistat [Miconazole]     Unknown: listed on MAR   Morphine And Related     Unknown: listed on MAR   Orange Juice [Orange Oil]     Unknown: listed on MAR    Allergies as of 10/29/2020      Reactions   Amoxicillin    Unknown, patient unable to answer questionnaire    Aspirin Other (See Comments)   On MAR   Avelox [moxifloxacin]    Unknown: listed on MAR   Erythromycin    Unknown: listed on MAR   Monistat [miconazole]    Unknown: listed on MAR   Morphine And Related    Unknown: listed on MAR   Orange Juice [orange Oil]    Unknown: listed on [facility]      Medication List       Accurate as of October 29, 2020  1:37 PM. If you have any questions, ask your nurse or doctor.        acetaminophen 325 MG tablet Commonly known as: TYLENOL Take 650 mg by mouth every 4 (four) hours as needed for moderate pain.   aspirin 81 MG chewable tablet Chew 81 mg by mouth daily.   calcium citrate-vitamin D 315-200 MG-UNIT tablet Commonly known as: CITRACAL+D Take 1 tablet by mouth daily.   CEROVITE SENIOR PO Take 1 tablet by mouth daily.   docusate sodium 100 MG capsule Commonly known as: COLACE Take 200 mg by mouth at bedtime.   fexofenadine 60 MG tablet Commonly known as: ALLEGRA Take 60 mg by mouth daily.   ipratropium-albuterol 0.5-2.5 (3) MG/3ML Soln Commonly known as: DUONEB Take 3 mLs by nebulization 2 (two) times daily.   levothyroxine 150 MCG  tablet Commonly known as: SYNTHROID Take 150 mcg by mouth daily.   melatonin 3 MG Tabs tablet Take 3 mg by mouth at bedtime.   OcuSoft Lid Scrub Allergy Pads Place 1 each into both eyes in the morning and at bedtime. Cleanse bilateral lids for excess eye secretions.   potassium chloride SA 20 MEQ tablet Commonly known as: KLOR-CON Take 20 mEq by mouth daily.   Refresh Tears 0.5 % Soln Generic drug: carboxymethylcellulose Place 1 drop into both eyes 2 (two) times daily.   sennosides-docusate sodium 8.6-50 MG tablet Commonly known as: SENOKOT-S Take 2 tablets by mouth at bedtime.   torsemide 20 MG tablet Commonly known as: DEMADEX Take 40 mg by mouth every other day.   torsemide 20 MG tablet Commonly known as: DEMADEX Take 20 mg by mouth every other day.   zinc oxide 20 % ointment Apply 1 application topically daily as needed  for irritation (To buttocks after every incontinent episode and for redness.).       Review of Systems  Constitutional: Negative for fatigue, fever and unexpected weight change.       Weight loss about #5Ibs in the past 2weeks.   HENT: Positive for hearing loss. Negative for congestion and voice change.   Eyes: Negative for visual disturbance.  Respiratory: Positive for shortness of breath. Negative for cough.        DOE  Cardiovascular: Positive for leg swelling.  Gastrointestinal: Negative for abdominal distention, abdominal pain, constipation, diarrhea, nausea and vomiting.  Genitourinary: Negative for difficulty urinating, dysuria and urgency.  Musculoskeletal: Positive for arthralgias and gait problem.  Skin: Negative for color change.  Neurological: Negative for dizziness, speech difficulty and weakness.       Memory lapses.   Psychiatric/Behavioral: Negative for behavioral problems and sleep disturbance. The patient is not nervous/anxious.     Immunization History  Administered Date(s) Administered   Influenza Whole 10/19/2012,  09/15/2018   Influenza, High Dose Seasonal PF 09/14/2019   Influenza-Unspecified 10/18/2013, 10/17/2014, 09/02/2015, 09/29/2016, 10/03/2017, 09/23/2020   Moderna SARS-COVID-2 Vaccination 12/15/2019, 01/12/2020   PPD Test 01/22/2013, 11/28/2015   Pneumococcal Conjugate-13 11/18/2017   Pneumococcal Polysaccharide-23 01/22/2013   Td 02/21/2009   Tdap 04/07/2015   Pertinent  Health Maintenance Due  Topic Date Due   INFLUENZA VACCINE  Completed   PNA vac Low Risk Adult  Completed   Fall Risk  09/05/2018 09/02/2017 11/14/2015 04/17/2015 09/26/2014  Falls in the past year? Yes No No Yes No  Number falls in past yr: 2 or more - - 1 -  Injury with Fall? Yes - - Yes -  Comment - - - laceration forehead 04/07/15 -  Risk Factor Category  - - - High Fall Risk -  Risk for fall due to : - - Impaired mobility;Impaired vision - -   Functional Status Survey:    Vitals:   10/29/20 0940  BP: (!) 146/76  Pulse: 87  Resp: 18  Temp: 98.2 F (36.8 C)  SpO2: 93%  Weight: 136 lb 8 oz (61.9 kg)  Height: 5' (1.524 m)   Body mass index is 26.66 kg/m. Physical Exam Vitals and nursing note reviewed.  Constitutional:      Appearance: Normal appearance.  HENT:     Head: Normocephalic and atraumatic.     Mouth/Throat:     Mouth: Mucous membranes are moist.  Eyes:     Extraocular Movements: Extraocular movements intact.     Pupils: Pupils are equal, round, and reactive to light.  Cardiovascular:     Rate and Rhythm: Normal rate and regular rhythm.     Heart sounds: No murmur heard.   Pulmonary:     Breath sounds: Rales present.     Comments: Bibasilar rales chronic Abdominal:     General: Bowel sounds are normal. There is no distension.     Palpations: Abdomen is soft.     Tenderness: There is no abdominal tenderness. There is no right CVA tenderness, left CVA tenderness, guarding or rebound.  Musculoskeletal:     Cervical back: Normal range of motion and neck supple.     Right lower  leg: Edema present.     Left lower leg: Edema present.     Comments: Trace edema BLE  Skin:    General: Skin is warm and dry.     Comments: BLE sensitive to touch as usual.   Neurological:  General: No focal deficit present.     Mental Status: She is alert. Mental status is at baseline.     Gait: Gait abnormal.     Comments: Oriented to person, place.   Psychiatric:        Mood and Affect: Mood normal.        Behavior: Behavior normal.     Labs reviewed: Recent Labs    06/06/20 0000 06/25/20 0000 10/17/20 0000  NA 139 141 140  K 3.8 4.1 4.3  CL 99 99 97*  CO2 31* 31* 30*  BUN 36* 34* 33*  CREATININE 1.1 1.2* 1.1  CALCIUM 8.8 9.3 9.4   Recent Labs    06/25/20 0000 10/17/20 0000 10/20/20 0000  AST 17 299* 99*  ALT 12 299* 299*  ALKPHOS 65 160* 171*  ALBUMIN 3.5 3.8 3.8   Recent Labs    06/06/20 0000 06/25/20 0000 10/17/20 0000  WBC 7.9 10.0 5.0  NEUTROABS 4,511 5,880 2,520.00  HGB 12.5 12.9 14.4  HCT 37 40 44  PLT 184 194 220   Lab Results  Component Value Date   TSH 0.47 10/17/2020   No results found for: HGBA1C No results found for: CHOL, HDL, LDLCALC, LDLDIRECT, TRIG, CHOLHDL  Significant Diagnostic Results in last 30 days:  DG Thoracic Spine 2 View  Result Date: 10/12/2020 CLINICAL DATA:  Pain post fall. EXAM: THORACIC SPINE 2 VIEWS COMPARISON:  None. FINDINGS: There is no evidence of thoracic spine fracture. Superior endplate compression deformity of L1 vertebral body with approximately 30% height loss, chronic from 2016. Thoracic kyphosis. Calcific atherosclerotic disease of the aorta and tortuosity. IMPRESSION: Superior endplate compression deformity of L1 vertebral body with approximately 30% height loss, chronic since 2016. No evidence of thoracic spine fracture. Electronically Signed   By: Fidela Salisbury M.D.   On: 10/12/2020 13:12   DG Lumbar Spine Complete  Result Date: 10/12/2020 CLINICAL DATA:  Pain after fall. EXAM: LUMBAR SPINE  - COMPLETE 4+ VIEW COMPARISON:  MRI February 04, 2012 FINDINGS: A compression fracture seen at the L1 level, similar in appearance since the February 04, 2012 MRI. No acute fractures are seen. Lower lumbar facet degenerative changes. Mild multilevel degenerative disc disease. Calcified atherosclerosis in the abdominal aorta. No other acute abnormalities. IMPRESSION: 1. No acute fracture or traumatic malalignment. Chronic compression fracture of L1. 2. Mild degenerative disc disease and lower lumbar facet degenerative changes. Electronically Signed   By: Dorise Bullion III M.D   On: 10/12/2020 13:11   DG Pelvis 1-2 Views  Result Date: 10/12/2020 CLINICAL DATA:  Fall with pain EXAM: PELVIS - 1-2 VIEW COMPARISON:  CT abdomen pelvis dated 11/04/2015. FINDINGS: There is no evidence of pelvic fracture or diastasis. Mild to moderate degenerative changes in both hips. Severe degenerative changes in the lumbar spine. IMPRESSION: No acute fracture or dislocation. Electronically Signed   By: Zerita Boers M.D.   On: 10/12/2020 13:22   CT Head Wo Contrast  Result Date: 10/12/2020 CLINICAL DATA:  Fall with head and neck pain. EXAM: CT HEAD WITHOUT CONTRAST CT CERVICAL SPINE WITHOUT CONTRAST TECHNIQUE: Multidetector CT imaging of the head and cervical spine was performed following the standard protocol without intravenous contrast. Multiplanar CT image reconstructions of the cervical spine were also generated. COMPARISON:  CT head and cervical spine dated 08/17/2018. FINDINGS: CT HEAD FINDINGS Brain: No evidence of acute infarction, hemorrhage, hydrocephalus, extra-axial collection or mass lesion/mass effect. There is moderate cerebral volume loss with associated ex vacuo dilatation. Periventricular  white matter hypoattenuation likely represents chronic small vessel ischemic disease. Vascular: There are vascular calcifications in the carotid siphons. Skull: Postsurgical changes from prior bilateral mastoidectomies. No  acute fracture. Sinuses/Orbits: There are bilateral mastoid effusions. Other: None. CT CERVICAL SPINE FINDINGS Alignment: Normal. Skull base and vertebrae: No acute fracture. No primary bone lesion or focal pathologic process. Soft tissues and spinal canal: No prevertebral fluid or swelling. No visible canal hematoma. Disc levels:  Severe multilevel degenerative disc and joint disease. Upper chest: Negative. Other: None IMPRESSION: 1. No acute intracranial process. 2. No acute osseous injury in the cervical spine. 3. Bilateral mastoid effusions. Electronically Signed   By: Zerita Boers M.D.   On: 10/12/2020 13:20   CT Cervical Spine Wo Contrast  Result Date: 10/12/2020 CLINICAL DATA:  Fall with head and neck pain. EXAM: CT HEAD WITHOUT CONTRAST CT CERVICAL SPINE WITHOUT CONTRAST TECHNIQUE: Multidetector CT imaging of the head and cervical spine was performed following the standard protocol without intravenous contrast. Multiplanar CT image reconstructions of the cervical spine were also generated. COMPARISON:  CT head and cervical spine dated 08/17/2018. FINDINGS: CT HEAD FINDINGS Brain: No evidence of acute infarction, hemorrhage, hydrocephalus, extra-axial collection or mass lesion/mass effect. There is moderate cerebral volume loss with associated ex vacuo dilatation. Periventricular white matter hypoattenuation likely represents chronic small vessel ischemic disease. Vascular: There are vascular calcifications in the carotid siphons. Skull: Postsurgical changes from prior bilateral mastoidectomies. No acute fracture. Sinuses/Orbits: There are bilateral mastoid effusions. Other: None. CT CERVICAL SPINE FINDINGS Alignment: Normal. Skull base and vertebrae: No acute fracture. No primary bone lesion or focal pathologic process. Soft tissues and spinal canal: No prevertebral fluid or swelling. No visible canal hematoma. Disc levels:  Severe multilevel degenerative disc and joint disease. Upper chest: Negative.  Other: None IMPRESSION: 1. No acute intracranial process. 2. No acute osseous injury in the cervical spine. 3. Bilateral mastoid effusions. Electronically Signed   By: Zerita Boers M.D.   On: 10/12/2020 13:20    Assessment/Plan Cholelithiasis Elevated LFT off Tylenol, RUQ [place], 10/21/20 [place] abd multiple gallstones in the gallbladder, largest 0.8cm.   10/20/20 repeated AST 99(was 299), ALT 299 again, total bilirubin 0.7(was 1.7), Alk phos 171(was 160) LFT 2 weeks.    Hypothyroidism Hypothyroidism, takes Levothyroxine 15mg qd, TSH0.44 06/25/20   Constipation Constipation, takes Colace 2061mqd, MOM prn, Senokot S II qhs   COPD, mild (HCC) COPD, takes Allegra 6016md, DuoNebbid  Edema of both lower legs due to peripheral venous insufficiency Edema BLE, chronic, takes Torsemide 38m32md, 40mg35m   Arthralgia OA pain, R knee, X-ray negative fx 05/16/20, prn Tylenol.  TIA (transient ischemic attack) TIA ?, Hx of CAD, takes ASA 81mg 71m      Family/ staff Communication: plan of care reviewed with the patient and charge nurse.   Labs/tests ordered: LFT 2 weeks.   Time spend 35 minutes.

## 2020-10-29 NOTE — Assessment & Plan Note (Signed)
Edema BLE, chronic, takes Torsemide 20mg qod, 40mg qod  

## 2020-10-29 NOTE — Assessment & Plan Note (Signed)
Elevated LFT off Tylenol, RUQ [place], 10/21/20 [place] abd multiple gallstones in the gallbladder, largest 0.8cm.   10/20/20 repeated AST 99(was 299), ALT 299 again, total bilirubin 0.7(was 1.7), Alk phos 171(was 160) LFT 2 weeks.

## 2020-10-29 NOTE — Assessment & Plan Note (Signed)
Hypothyroidism, takes Levothyroxine 150mcg qd, TSH0.44 06/25/20  

## 2020-10-29 NOTE — Assessment & Plan Note (Signed)
TIA ?, Hx of CAD, takes ASA 81mg qd. 

## 2020-11-13 ENCOUNTER — Encounter: Payer: Self-pay | Admitting: Nurse Practitioner

## 2020-11-13 ENCOUNTER — Non-Acute Institutional Stay (SKILLED_NURSING_FACILITY): Payer: Medicare PPO | Admitting: Nurse Practitioner

## 2020-11-13 DIAGNOSIS — I739 Peripheral vascular disease, unspecified: Secondary | ICD-10-CM | POA: Diagnosis not present

## 2020-11-13 DIAGNOSIS — R296 Repeated falls: Secondary | ICD-10-CM | POA: Diagnosis not present

## 2020-11-13 DIAGNOSIS — K8018 Calculus of gallbladder with other cholecystitis without obstruction: Secondary | ICD-10-CM

## 2020-11-13 DIAGNOSIS — R2681 Unsteadiness on feet: Secondary | ICD-10-CM

## 2020-11-13 DIAGNOSIS — E890 Postprocedural hypothyroidism: Secondary | ICD-10-CM

## 2020-11-13 DIAGNOSIS — K5900 Constipation, unspecified: Secondary | ICD-10-CM

## 2020-11-13 DIAGNOSIS — G459 Transient cerebral ischemic attack, unspecified: Secondary | ICD-10-CM | POA: Diagnosis not present

## 2020-11-13 DIAGNOSIS — T148XXA Other injury of unspecified body region, initial encounter: Secondary | ICD-10-CM

## 2020-11-13 DIAGNOSIS — M2550 Pain in unspecified joint: Secondary | ICD-10-CM

## 2020-11-13 DIAGNOSIS — J449 Chronic obstructive pulmonary disease, unspecified: Secondary | ICD-10-CM | POA: Diagnosis not present

## 2020-11-13 DIAGNOSIS — K8020 Calculus of gallbladder without cholecystitis without obstruction: Secondary | ICD-10-CM | POA: Diagnosis not present

## 2020-11-13 NOTE — Assessment & Plan Note (Signed)
Constipation, takes Colace 200mg qd, MOM prn, Senokot S II qhs  

## 2020-11-13 NOTE — Assessment & Plan Note (Signed)
OA pain, R knee, X-ray negative fx 05/16/20, prn Tylenol.  

## 2020-11-13 NOTE — Assessment & Plan Note (Addendum)
Fall 11/12/20 when the patient was found on the floor at the bathroom door. Sustained the dorsum right hand and right knee bruise, no warmth/swelling, reduced ROM, or pain palpated. Lack of safety awareness, increased frailty, unsteady gait are contributory to falling, close supervision/assistance for safety in [facility]. Continue to observe .

## 2020-11-13 NOTE — Assessment & Plan Note (Signed)
COPD, takes Allegra 60mg qd, DuoNebbid  

## 2020-11-13 NOTE — Assessment & Plan Note (Signed)
Edema BLE, chronic, takes Torsemide 20mg qod, 40mg qod  

## 2020-11-13 NOTE — Assessment & Plan Note (Signed)
Elevated LFT off Tylenol, RUQ [place], 10/21/20 [place] abd multiple gallstones in the gallbladder, largest 0.8cm.   repeated AST 99(was 299), ALT 299 again, total bilirubin 0.7(was 1.7), Alk phos 171(was 160)

## 2020-11-13 NOTE — Assessment & Plan Note (Signed)
Hypothyroidism, takes Levothyroxine qd, TSH0.44 06/25/20

## 2020-11-13 NOTE — Assessment & Plan Note (Signed)
Progressing, ambulates with walker with SBA

## 2020-11-13 NOTE — Progress Notes (Signed)
Location:   [facility] Room Number: 28 Place of Service:  SNF (31) Provider: Marda Stalker, Lennie Odor NP   [name], MD  Patient Care Team: [name], MD as PCP - General (Internal Medicine) [place] [place] [place] X, NP as Nurse Practitioner (Nurse Practitioner)  Extended Emergency Contact Information Primary Emergency Contact: [name] [place] of [place] Phone: [phone] Relation: Son Secondary Emergency Contact: [place] Phone: [phone] Mobile Phone: [phone] Relation: Daughter  Code Status:  DNR Goals of care: Advanced Directive information Advanced Directives 10/29/2020  Does Patient Have a Medical Advance Directive? Yes  Type of Advance Directive Out of facility DNR (pink MOST or yellow form);Living will;Healthcare Power of Attorney  Does patient want to make changes to medical advance directive? No - Patient declined  Copy of [place] in Chart? Yes - validated most recent copy scanned in chart (See row information)  Would patient like information on creating a medical advance directive? -  Pre-existing out of facility DNR order (yellow form or pink MOST form) Pink MOST form placed in chart (order not valid for inpatient use);Yellow form placed in chart (order not valid for inpatient use)     Chief Complaint  Patient presents with   Acute Visit    Fall    HPI:  Pt is a 86 y.o. female seen today for an acute visit for fall 11/12/20 when the patient was found on the floor at the bathroom door. Sustained the dorsum right hand and right knee bruise, no warmth/swelling, reduced ROM, or pain    Hypothyroidism, takes Levothyroxine 144mg qd, TSH0.44 06/25/20 Constipation, takes Colace 2030mqd, MOM prn, Senokot S II qhs COPD, takes Allegra 6017md, DuoNebbid Edema BLE, chronic, takes Torsemide 76m61md, 40mg11md OA pain, R knee, X-ray negative fx 05/16/20, prn Tylenol. TIA ?, Hx of CAD, takes ASA 81mg 23m             Elevated LFT off Tylenol, RUQ US, 11[place]/21 US abdKoreaultiple gallstones in the gallbladder, largest 0.8cm.   repeated AST 99(was 299), ALT 299 again, total bilirubin 0.7(was 1.7), Alk phos 171(was 160) Past Medical History:  Diagnosis Date   Abnormal liver function tests    11/11/15 AST 31, ALT 88, alk phos 96    Acute kidney failure, unspecified (HCC) 2[place]/2013   Arthralgia 09/26/2014   Multiple joints: knees, shoulders, wrists, spine hips    Arthritis    Candidiasis of other urogenital sites 08/10/2012   Cerebral embolism 05/23/2005   CHF (congestive heart failure) (HCC)  Kimbleholelithiasis 11/04/2015   Closed fracture of sacrum and coccyx without mention of spinal cord injury 02/14/2012   Constipation 05/03/2013   Contusion of face, scalp, and neck except eye(s) 06/01/2012   Contusion of wrist 06/01/2012   COPD (chronic obstructive pulmonary disease) (HCC)    COPD, mild (HCC) 2[place]2014   Edema 04/20/2012   H/O: CVA (cerebrovascular accident)    Hearing loss 09/26/2014   History of cancer of uterus    HTN (hypertension), benign    Hyperglycemia 11/14/2015   Hypothyroidism    Insomnia, unspecified 08/10/2012   Open wound of knee, leg (except thigh), and ankle, without mention of complication 5/9/206/8/1157er and unspecified hyperlipidemia 06/01/2012   Other disorder of coccyx 01/27/2012   Pain in joint, ankle and foot 06/14/2012   Peripheral vascular disease, unspecified (HCC) 2[place]/2013   Personal history of fall  01/27/2012   Pneumonia, organism unspecified(486) 01/23/2005   Rheumatic fever 09/26/1933   Age 78 10/01/14 ESR 13, RAF <10    Seasonal allergies 05/03/2013   Stasis dermatitis of both legs 09/26/2014   Unspecified constipation 11/02/2012   Unspecified hearing loss 02/08/2013   Unspecified hereditary and idiopathic  peripheral neuropathy 01/27/2012   Urinary frequency 02/24/2014   Ventricular fibrillation ([place]) 01/24/2012   Past Surgical History:  Procedure Laterality Date   ABDOMINAL HYSTERECTOMY  1990   for endometrial cancer   CATARACT EXTRACTION W/ INTRAOCULAR LENS  IMPLANT, BILATERAL     ECTOPIC PREGNANCY SURGERY  1952   GUM SURGERY  1932   MASTOIDECTOMY  1920   bilateral   THYROIDECTOMY  1960   TONSILLECTOMY  1916    Allergies  Allergen Reactions   Amoxicillin     Unknown, patient unable to answer questionnaire    Aspirin Other (See Comments)    On MAR   Avelox [Moxifloxacin]     Unknown: listed on MAR   Erythromycin     Unknown: listed on MAR   Monistat [Miconazole]     Unknown: listed on MAR   Morphine And Related     Unknown: listed on MAR   Orange Juice [Orange Oil]     Unknown: listed on MAR    Allergies as of 11/13/2020      Reactions   Amoxicillin    Unknown, patient unable to answer questionnaire    Aspirin Other (See Comments)   On MAR   Avelox [moxifloxacin]    Unknown: listed on MAR   Erythromycin    Unknown: listed on MAR   Monistat [miconazole]    Unknown: listed on MAR   Morphine And Related    Unknown: listed on MAR   Orange Juice [orange Oil]    Unknown: listed on [facility]      Medication List       Accurate as of November 13, 2020 11:59 PM. If you have any questions, ask your nurse or doctor.        STOP taking these medications   acetaminophen 325 MG tablet Commonly known as: TYLENOL Stopped by: Sonja Manseau X Cristella Stiver, NP     TAKE these medications   aspirin 81 MG chewable tablet Chew 81 mg by mouth daily.   calcium citrate-vitamin D 315-200 MG-UNIT tablet Commonly known as: CITRACAL+D Take 1 tablet by mouth daily.   CEROVITE SENIOR PO Take 1 tablet by mouth daily.   docusate sodium 100 MG capsule Commonly known as: COLACE Take 200 mg by mouth at bedtime.   fexofenadine 60 MG tablet Commonly known as: ALLEGRA Take 60 mg by mouth  daily.   ipratropium-albuterol 0.5-2.5 (3) MG/3ML Soln Commonly known as: DUONEB Take 3 mLs by nebulization 2 (two) times daily.   levothyroxine 150 MCG tablet Commonly known as: SYNTHROID Take 150 mcg by mouth daily.   melatonin 3 MG Tabs tablet Take 3 mg by mouth at bedtime.   OcuSoft Lid Scrub Allergy Pads Place 1 each into both eyes in the morning and at bedtime. Cleanse bilateral lids for excess eye secretions.   potassium chloride SA 20 MEQ tablet Commonly known as: KLOR-CON Take 20 mEq by mouth daily.   Refresh Tears 0.5 % Soln Generic drug: carboxymethylcellulose Place 1 drop into both eyes 2 (two) times daily.   sennosides-docusate sodium 8.6-50 MG tablet Commonly known as: SENOKOT-S Take 2 tablets by mouth at bedtime.   torsemide 20 MG tablet Commonly known as:  DEMADEX Take 40 mg by mouth every other day.   torsemide 20 MG tablet Commonly known as: DEMADEX Take 20 mg by mouth every other day.   zinc oxide 20 % ointment Apply 1 application topically daily as needed for irritation (To buttocks after every incontinent episode and for redness.).       Review of Systems  Constitutional: Negative for fatigue, fever and unexpected weight change.       Weight loss about #5Ibs in the past 2weeks.   HENT: Positive for hearing loss. Negative for congestion and voice change.   Eyes: Negative for visual disturbance.  Respiratory: Positive for shortness of breath. Negative for cough.        DOE  Cardiovascular: Positive for leg swelling.  Gastrointestinal: Negative for abdominal distention, abdominal pain, constipation, diarrhea, nausea and vomiting.  Genitourinary: Negative for difficulty urinating, dysuria and urgency.  Musculoskeletal: Positive for arthralgias and gait problem.  Skin: Negative for color change.       Bruised back of the right hand, right knee  Neurological: Negative for dizziness, speech difficulty and weakness.       Memory lapses.    Psychiatric/Behavioral: Negative for behavioral problems and sleep disturbance. The patient is not nervous/anxious.     Immunization History  Administered Date(s) Administered   Influenza Whole 10/19/2012, 09/15/2018   Influenza, High Dose Seasonal PF 09/14/2019   Influenza-Unspecified 10/18/2013, 10/17/2014, 09/02/2015, 09/29/2016, 10/03/2017, 09/23/2020   Moderna SARS-COVID-2 Vaccination 12/15/2019, 01/12/2020   PPD Test 01/22/2013, 11/28/2015   Pneumococcal Conjugate-13 11/18/2017   Pneumococcal Polysaccharide-23 01/22/2013   Td 02/21/2009   Tdap 04/07/2015   Pertinent  Health Maintenance Due  Topic Date Due   INFLUENZA VACCINE  Completed   PNA vac Low Risk Adult  Completed   Fall Risk  09/05/2018 09/02/2017 11/14/2015 04/17/2015 09/26/2014  Falls in the past year? Yes No No Yes No  Number falls in past yr: 2 or more - - 1 -  Injury with Fall? Yes - - Yes -  Comment - - - laceration forehead 04/07/15 -  Risk Factor Category  - - - High Fall Risk -  Risk for fall due to : - - Impaired mobility;Impaired vision - -   Functional Status Survey:    Vitals:   11/13/20 1314  BP: 138/72  Pulse: 78  Resp: 20  Temp: 98.4 F (36.9 C)  SpO2: 94%  Weight: 135 lb 8 oz (61.5 kg)  Height: 5' (1.524 m)   Body mass index is 26.46 kg/m. Physical Exam Vitals and nursing note reviewed.  Constitutional:      Appearance: Normal appearance.  HENT:     Head: Normocephalic and atraumatic.     Mouth/Throat:     Mouth: Mucous membranes are moist.  Eyes:     Extraocular Movements: Extraocular movements intact.     Pupils: Pupils are equal, round, and reactive to light.  Cardiovascular:     Rate and Rhythm: Normal rate and regular rhythm.     Heart sounds: Murmur heard.   Pulmonary:     Breath sounds: Rales present.     Comments: Bibasilar rales chronic Abdominal:     General: Bowel sounds are normal. There is no distension.     Palpations: Abdomen is soft.     Tenderness:  There is no abdominal tenderness. There is no right CVA tenderness, left CVA tenderness, guarding or rebound.  Musculoskeletal:     Cervical back: Normal range of motion and neck supple.  Right lower leg: Edema present.     Left lower leg: Edema present.     Comments: Trace edema BLE  Skin:    General: Skin is warm and dry.     Findings: Bruising present.     Comments: Sustained the dorsum right hand and right knee bruise, no warmth/swelling, reduced ROM, or pain palpated. Continue to observe .   Neurological:     General: No focal deficit present.     Mental Status: She is alert. Mental status is at baseline.     Gait: Gait abnormal.     Comments: Oriented to person, place.   Psychiatric:        Mood and Affect: Mood normal.        Behavior: Behavior normal.     Labs reviewed: Recent Labs    06/06/20 0000 06/25/20 0000 10/17/20 0000  NA 139 141 140  K 3.8 4.1 4.3  CL 99 99 97*  CO2 31* 31* 30*  BUN 36* 34* 33*  CREATININE 1.1 1.2* 1.1  CALCIUM 8.8 9.3 9.4   Recent Labs    06/25/20 0000 10/17/20 0000 10/20/20 0000  AST 17 299* 99*  ALT 12 299* 299*  ALKPHOS 65 160* 171*  ALBUMIN 3.5 3.8 3.8   Recent Labs    06/06/20 0000 06/25/20 0000 10/17/20 0000  WBC 7.9 10.0 5.0  NEUTROABS 4,511 5,880 2,520.00  HGB 12.5 12.9 14.4  HCT 37 40 44  PLT 184 194 220   Lab Results  Component Value Date   TSH 0.47 10/17/2020   No results found for: HGBA1C No results found for: CHOL, HDL, LDLCALC, LDLDIRECT, TRIG, CHOLHDL  Significant Diagnostic Results in last 30 days:  No results found.  Assessment/Plan Frequent falls Fall 11/12/20 when the patient was found on the floor at the bathroom door. Sustained the dorsum right hand and right knee bruise, no warmth/swelling, reduced ROM, or pain palpated. Lack of safety awareness, increased frailty, unsteady gait are contributory to falling, close supervision/assistance for safety in [facility]. Continue to observe  .  Abrasion Sustained the dorsum right hand and right knee bruise, no warmth/swelling, reduced ROM, or pain palpated. Continue to observe .   Hypothyroidism Hypothyroidism, takes Levothyroxine 143mg qd, TSH0.44 06/25/20   Constipation Constipation, takes Colace 2090mqd, MOM prn, Senokot S II qhs  COPD, mild (HCC) COPD, takes Allegra 6044md, DuoNebbid   PVD (peripheral vascular disease) (HCC) Edema BLE, chronic, takes Torsemide 60m53md, 40mg69m   Gait instability Progressing, ambulates with walker with SBA  Arthralgia OA pain, R knee, X-ray negative fx 05/16/20, prn Tylenol.   TIA (transient ischemic attack) TIA ?, Hx of CAD, takes ASA 81mg 76m   Cholelithiasis Elevated LFT off Tylenol, RUQ US, 11[place]/21 US abdKoreaultiple gallstones in the gallbladder, largest 0.8cm.   repeated AST 99(was 299), ALT 299 again, total bilirubin 0.7(was 1.7), Alk phos 171(was 160)      Family/ staff Communication: plan of care reviewed with the patient and charge nurse.   Labs/tests ordered:  none  Time spend 35 minutes.

## 2020-11-13 NOTE — Assessment & Plan Note (Signed)
Sustained the dorsum right hand and right knee bruise, no warmth/swelling, reduced ROM, or pain palpated. Continue to observe .

## 2020-11-13 NOTE — Assessment & Plan Note (Signed)
TIA ?, Hx of CAD, takes ASA 81mg qd. 

## 2020-11-14 ENCOUNTER — Encounter: Payer: Self-pay | Admitting: Nurse Practitioner

## 2020-11-14 LAB — HEPATIC FUNCTION PANEL
ALT: 13 (ref 7–35)
AST: 17 (ref 13–35)
Alkaline Phosphatase: 71 (ref 25–125)
Bilirubin, Direct: 0 — AB (ref 0.01–0.4)
Bilirubin, Total: 0.3

## 2020-11-14 LAB — COMPREHENSIVE METABOLIC PANEL
Albumin: 3.2 — AB (ref 3.5–5.0)
Globulin: 2.4

## 2020-11-18 ENCOUNTER — Non-Acute Institutional Stay (SKILLED_NURSING_FACILITY): Payer: Medicare PPO | Admitting: Internal Medicine

## 2020-11-18 ENCOUNTER — Encounter: Payer: Self-pay | Admitting: Internal Medicine

## 2020-11-18 DIAGNOSIS — K8020 Calculus of gallbladder without cholecystitis without obstruction: Secondary | ICD-10-CM

## 2020-11-18 DIAGNOSIS — J449 Chronic obstructive pulmonary disease, unspecified: Secondary | ICD-10-CM | POA: Diagnosis not present

## 2020-11-18 DIAGNOSIS — R296 Repeated falls: Secondary | ICD-10-CM | POA: Diagnosis not present

## 2020-11-18 DIAGNOSIS — I872 Venous insufficiency (chronic) (peripheral): Secondary | ICD-10-CM

## 2020-11-18 DIAGNOSIS — R600 Localized edema: Secondary | ICD-10-CM | POA: Diagnosis not present

## 2020-11-18 DIAGNOSIS — G459 Transient cerebral ischemic attack, unspecified: Secondary | ICD-10-CM | POA: Diagnosis not present

## 2020-11-18 NOTE — Progress Notes (Signed)
Location:   [facility] Room Number: 28 Place of Service:  SNF [phone]) Provider:  Veleta Miners MD  [name], MD  Patient Care Team: [name], MD as PCP - General (Internal Medicine) [place], [facility] [place], [place] X, NP as Nurse Practitioner (Nurse Practitioner)  Extended Emergency Contact Information Primary Emergency Contact: [name] States of [place] Phone: [phone] Relation: Son Secondary Emergency Contact: [place] Phone: [phone] Mobile Phone: [phone] Relation: Daughter  Code Status:  DNR Goals of care: Advanced Directive information Advanced Directives 11/18/2020  Does Patient Have a Medical Advance Directive? Yes  Type of Advance Directive Out of facility DNR (pink MOST or yellow form);Living will;Healthcare Power of Attorney  Does patient want to make changes to medical advance directive? No - Patient declined  Copy of [place] in Chart? Yes - validated most recent copy scanned in chart (See row information)  Would patient like information on creating a medical advance directive? -  Pre-existing out of facility DNR order (yellow form or pink MOST form) Pink MOST form placed in chart (order not valid for inpatient use);Yellow form placed in chart (order not valid for inpatient use)     Chief Complaint  Patient presents with  . Medical Management of Chronic Issues    HPI:  Pt is a 86 y.o. female seen today for medical management of chronic diseases.    Patient has h/o Bilateral LE edema , Hypertension, Hard of hearing, h/o Cellulitis, Hypothyroidism, COPD, Chronic Conjunctivitis  Had elevated LFTs 6 weeks ago.  Patient was completely asymptomatic.  Right upper quadrant ultrasound showed multiple gallstones but Murphy sign was negative with no inflammation.  Her LFTs came down.  Done few days ago were completely normal Patient continues to  be asymptomatic  Has lost some weight in past few months. Continues to sometimes have falls.  Sleeps on her recliner Has lower extremity edema but no cough or shortness of breath Past Medical History:  Diagnosis Date  . Abnormal liver function tests    11/11/15 AST 31, ALT 88, alk phos 96   . Acute kidney failure, unspecified ([place]) 01/24/2012  . Arthralgia 09/26/2014   Multiple joints: knees, shoulders, wrists, spine hips   . Arthritis   . Candidiasis of other urogenital sites 08/10/2012  . Cerebral embolism 05/23/2005  . CHF (congestive heart failure) ([place])   . Cholelithiasis 11/04/2015  . Closed fracture of sacrum and coccyx without mention of spinal cord injury 02/14/2012  . Constipation 05/03/2013  . Contusion of face, scalp, and neck except eye(s) 06/01/2012  . Contusion of wrist 06/01/2012  . COPD (chronic obstructive pulmonary disease) ([place])   . COPD, mild ([place]) 01/20/2013  . Edema 04/20/2012  . H/O: CVA (cerebrovascular accident)   . Hearing loss 09/26/2014  . History of cancer of uterus   . HTN (hypertension), benign   . Hyperglycemia 11/14/2015  . Hypothyroidism   . Insomnia, unspecified 08/10/2012  . Open wound of knee, leg (except thigh), and ankle, without mention of complication 12/21/3788  . Other and unspecified hyperlipidemia 06/01/2012  . Other disorder of coccyx 01/27/2012  . Pain in joint, ankle and foot 06/14/2012  . Peripheral vascular disease, unspecified ([place]) 01/27/2012  . Personal history of fall 01/27/2012  . Pneumonia, organism unspecified(486) 01/23/2005  . Rheumatic fever 09/26/1933   Age 68 10/01/14 ESR 13, RAF <10   . Seasonal allergies 05/03/2013  . Stasis dermatitis of both legs  09/26/2014  . Unspecified constipation 11/02/2012  . Unspecified hearing loss 02/08/2013  . Unspecified hereditary and idiopathic peripheral neuropathy 01/27/2012  . Urinary frequency 02/24/2014  . Ventricular fibrillation ([place]) 01/24/2012   Past Surgical History:  Procedure Laterality  Date  . ABDOMINAL HYSTERECTOMY  1990   for endometrial cancer  . CATARACT EXTRACTION W/ INTRAOCULAR LENS  IMPLANT, BILATERAL    . [place]  . GUM SURGERY  1932  . MASTOIDECTOMY  1920   bilateral  . THYROIDECTOMY  1960  . TONSILLECTOMY  1916    Allergies  Allergen Reactions  . Amoxicillin     Unknown, patient unable to answer questionnaire   . Aspirin Other (See Comments)    On MAR  . Avelox [Moxifloxacin]     Unknown: listed on MAR  . Erythromycin     Unknown: listed on MAR  . Monistat [Miconazole]     Unknown: listed on MAR  . Morphine And Related     Unknown: listed on MAR  . Orange Juice [Orange Oil]     Unknown: listed on MAR    Allergies as of 11/18/2020      Reactions   Amoxicillin    Unknown, patient unable to answer questionnaire    Aspirin Other (See Comments)   On MAR   Avelox [moxifloxacin]    Unknown: listed on MAR   Erythromycin    Unknown: listed on MAR   Monistat [miconazole]    Unknown: listed on MAR   Morphine And Related    Unknown: listed on MAR   Orange Juice [orange Oil]    Unknown: listed on [facility]      Medication List       Accurate as of November 18, 2020  9:32 AM. If you have any questions, ask your nurse or doctor.        aspirin 81 MG chewable tablet Chew 81 mg by mouth daily.   calcium citrate-vitamin D 315-200 MG-UNIT tablet Commonly known as: CITRACAL+D Take 1 tablet by mouth daily.   CEROVITE SENIOR PO Take 1 tablet by mouth daily.   docusate sodium 100 MG capsule Commonly known as: COLACE Take 200 mg by mouth at bedtime.   fexofenadine 60 MG tablet Commonly known as: ALLEGRA Take 60 mg by mouth daily.   ipratropium-albuterol 0.5-2.5 (3) MG/3ML Soln Commonly known as: DUONEB Take 3 mLs by nebulization 2 (two) times daily.   levothyroxine 150 MCG tablet Commonly known as: SYNTHROID Take 150 mcg by mouth daily.   melatonin 3 MG Tabs tablet Take 3 mg by mouth at bedtime.   OcuSoft Lid  Scrub Allergy Pads Place 1 each into both eyes in the morning and at bedtime. Cleanse bilateral lids for excess eye secretions.   potassium chloride SA 20 MEQ tablet Commonly known as: KLOR-CON Take 20 mEq by mouth daily.   Refresh Tears 0.5 % Soln Generic drug: carboxymethylcellulose Place 1 drop into both eyes 2 (two) times daily.   sennosides-docusate sodium 8.6-50 MG tablet Commonly known as: SENOKOT-S Take 2 tablets by mouth at bedtime.   torsemide 20 MG tablet Commonly known as: DEMADEX Take 40 mg by mouth every other day.   torsemide 20 MG tablet Commonly known as: DEMADEX Take 20 mg by mouth every other day.   zinc oxide 20 % ointment Apply 1 application topically daily as needed for irritation (To buttocks after every incontinent episode and for redness.).       Review of Systems  Constitutional: Positive for activity change.  HENT: Negative.   Respiratory: Negative for cough and shortness of breath.   Cardiovascular: Positive for leg swelling.  Gastrointestinal: Negative.   Genitourinary: Negative.   Musculoskeletal: Positive for gait problem.  Skin: Negative.   Neurological: Positive for weakness.  Psychiatric/Behavioral: Positive for sleep disturbance.    Immunization History  Administered Date(s) Administered  . Influenza Whole 10/19/2012, 09/15/2018  . Influenza, High Dose Seasonal PF 09/14/2019  . Influenza-Unspecified 10/18/2013, 10/17/2014, 09/02/2015, 09/29/2016, 10/03/2017, 09/23/2020  . Moderna SARS-COVID-2 Vaccination 12/15/2019, 01/12/2020  . PPD Test 01/22/2013, 11/28/2015  . Pneumococcal Conjugate-13 11/18/2017  . Pneumococcal Polysaccharide-23 01/22/2013  . Td 02/21/2009  . Tdap 04/07/2015   Pertinent  Health Maintenance Due  Topic Date Due  . INFLUENZA VACCINE  Completed  . PNA vac Low Risk Adult  Completed   Fall Risk  09/05/2018 09/02/2017 11/14/2015 04/17/2015 09/26/2014  Falls in the past year? Yes No No Yes No  Number falls in  past yr: 2 or more - - 1 -  Injury with Fall? Yes - - Yes -  Comment - - - laceration forehead 04/07/15 -  Risk Factor Category  - - - High Fall Risk -  Risk for fall due to : - - Impaired mobility;Impaired vision - -   Functional Status Survey:    Vitals:   11/18/20 0926  BP: 100/68  Pulse: 74  Resp: 20  Temp: 98.4 F (36.9 C)  SpO2: 93%  Weight: 135 lb 8 oz (61.5 kg)  Height: 5' (1.524 m)   Body mass index is 26.46 kg/m. Physical Exam Vitals reviewed.  Constitutional:      Appearance: Normal appearance.     Comments: Very h hard of hearing  HENT:     Head: Normocephalic.     Nose: Nose normal.     Mouth/Throat:     Mouth: Mucous membranes are moist.     Pharynx: Oropharynx is clear.  Eyes:     Pupils: Pupils are equal, round, and reactive to light.  Cardiovascular:     Rate and Rhythm: Normal rate and regular rhythm.     Pulses: Normal pulses.     Heart sounds: Normal heart sounds.  Pulmonary:     Effort: Pulmonary effort is normal.     Breath sounds: Normal breath sounds.  Abdominal:     General: Abdomen is flat. Bowel sounds are normal. There is no distension.     Palpations: Abdomen is soft.     Tenderness: There is no abdominal tenderness.  Musculoskeletal:        General: Swelling present.     Cervical back: Neck supple.  Skin:    General: Skin is warm.  Neurological:     General: No focal deficit present.     Mental Status: She is alert.  Psychiatric:        Mood and Affect: Mood normal.        Thought Content: Thought content normal.     Labs reviewed: Recent Labs    06/06/20 0000 06/25/20 0000 10/17/20 0000  NA 139 141 140  K 3.8 4.1 4.3  CL 99 99 97*  CO2 31* 31* 30*  BUN 36* 34* 33*  CREATININE 1.1 1.2* 1.1  CALCIUM 8.8 9.3 9.4   Recent Labs    10/17/20 0000 10/20/20 0000 11/14/20 0000  AST 299* 99* 17  ALT 299* 299* 13  ALKPHOS 160* 171* 71  ALBUMIN 3.8 3.8 3.2*   Recent Labs  06/06/20 0000 06/25/20 0000  10/17/20 0000  WBC 7.9 10.0 5.0  NEUTROABS 4,511 5,880 2,520.00  HGB 12.5 12.9 14.4  HCT 37 40 44  PLT 184 194 220   Lab Results  Component Value Date   TSH 0.47 10/17/2020   No results found for: HGBA1C No results found for: CHOL, HDL, LDLCALC, LDLDIRECT, TRIG, CHOLHDL  Significant Diagnostic Results in last 30 days:  No results found.  Assessment/Plan Gall bladder stones Asymptomatic LFTS back to normal Follow Clinically Frequent falls Supportive care Edema of both lower legs due to peripheral venous insufficiency On Demadex BUN and Creat are stab;e TIA (transient ischemic attack) On Low dose of Aspirin COPD, mild (HCC) Nebs PRN Hypothyroidism TSH Normal 11/21   Family/ staff Communication:   Labs/tests ordered:

## 2020-12-18 ENCOUNTER — Non-Acute Institutional Stay (SKILLED_NURSING_FACILITY): Payer: Medicare PPO | Admitting: Nurse Practitioner

## 2020-12-18 ENCOUNTER — Encounter: Payer: Self-pay | Admitting: Nurse Practitioner

## 2020-12-18 DIAGNOSIS — R600 Localized edema: Secondary | ICD-10-CM | POA: Diagnosis not present

## 2020-12-18 DIAGNOSIS — G459 Transient cerebral ischemic attack, unspecified: Secondary | ICD-10-CM | POA: Diagnosis not present

## 2020-12-18 DIAGNOSIS — J449 Chronic obstructive pulmonary disease, unspecified: Secondary | ICD-10-CM

## 2020-12-18 DIAGNOSIS — E890 Postprocedural hypothyroidism: Secondary | ICD-10-CM | POA: Diagnosis not present

## 2020-12-18 DIAGNOSIS — I872 Venous insufficiency (chronic) (peripheral): Secondary | ICD-10-CM | POA: Diagnosis not present

## 2020-12-18 DIAGNOSIS — M8949 Other hypertrophic osteoarthropathy, multiple sites: Secondary | ICD-10-CM

## 2020-12-18 DIAGNOSIS — K8018 Calculus of gallbladder with other cholecystitis without obstruction: Secondary | ICD-10-CM | POA: Diagnosis not present

## 2020-12-18 DIAGNOSIS — K5900 Constipation, unspecified: Secondary | ICD-10-CM

## 2020-12-18 DIAGNOSIS — M159 Polyosteoarthritis, unspecified: Secondary | ICD-10-CM | POA: Insufficient documentation

## 2020-12-18 NOTE — Assessment & Plan Note (Signed)
Edema BLE, chronic, takes Torsemide 20mg qod, 40mg qod. Bun/creat 33/1.1 10/17/20 

## 2020-12-18 NOTE — Assessment & Plan Note (Signed)
OA pain, R knee, X-ray negative fx 05/16/20, prn Tylenol.  

## 2020-12-18 NOTE — Assessment & Plan Note (Signed)
Constipation, takes Colace 200mg qd, MOM prn, Senokot S II qhs  

## 2020-12-18 NOTE — Assessment & Plan Note (Signed)
COPD, takes Allegra 60mg qd, DuoNebbid  

## 2020-12-18 NOTE — Assessment & Plan Note (Signed)
TIA ?, Hx of CAD, takes ASA 81mg qd. 

## 2020-12-18 NOTE — Assessment & Plan Note (Signed)
Hypothyroidism, takes Levothyroxine 150mcg qd, TSH0.47 10/17/20 

## 2020-12-18 NOTE — Progress Notes (Signed)
Location:    [place] Room Number: 28 Place of Service:  SNF (31) Provider: Lennie Odor Tennile Styles NP  [name], MD  Patient Care Team: [name], MD as PCP - General (Internal Medicine) [place], [place], [facility] Carisma Troupe X, NP as Nurse Practitioner (Nurse Practitioner)  Extended Emergency Contact Information Primary Emergency Contact: [name] [place] Phone: [phone] Relation: Son Secondary Emergency Contact: [place] Phone: [phone] Mobile Phone: [phone] Relation: Daughter  Code Status:  DNR Goals of care: Advanced Directive information Advanced Directives 11/18/2020  Does Patient Have a Medical Advance Directive? Yes  Type of Advance Directive Out of facility DNR (pink MOST or yellow form);Living will;Healthcare Power of Attorney  Does patient want to make changes to medical advance directive? No - Patient declined  Copy of [place] in Chart? Yes - validated most recent copy scanned in chart (See row information)  Would patient like information on creating a medical advance directive? -  Pre-existing out of facility DNR order (yellow form or pink MOST form) Pink MOST form placed in chart (order not valid for inpatient use);Yellow form placed in chart (order not valid for inpatient use)     Chief Complaint  Patient presents with  . Medical Management of Chronic Issues    HPI:  Pt is a 87 y.o. female seen today for medical management of chronic diseases.    Hypothyroidism, takes Levothyroxine 185mg qd, TSH0.47 10/17/20 Constipation, takes Colace 2087mqd, MOM prn, Senokot S II qhs COPD, takes Allegra 606md, DuoNebbid Edema BLE, chronic, takes Torsemide 38m36md, 40mg80m. Bun/creat 33/1.1 10/17/20 OA pain, R knee, X-ray negative fx 05/16/20, prn Tylenol. TIA ?, Hx of CAD, takes ASA  81mg 26mElevated LFT off Tylenol, RUQ US,10/21/20 US abdKoreaultiple gallstones in the gallbladder, largest 0.8cm. repeated AST 17(was 299), ALT 13(was 299),  total bilirubin 0.3(was 1.7), Alk phos 71(was 160) 11/14/20   Past Medical History:  Diagnosis Date  . Abnormal liver function tests    11/11/15 AST 31, ALT 88, alk phos 96   . Acute kidney failure, unspecified (HCC) 2[place]/2013  . Arthralgia 09/26/2014   Multiple joints: knees, shoulders, wrists, spine hips   . Arthritis   . Candidiasis of other urogenital sites 08/10/2012  . Cerebral embolism 05/23/2005  . CHF (congestive heart failure) (HCC)  [place] 11/04/2015  . Closed fracture of sacrum and coccyx without mention of spinal cord injury 02/14/2012  . Constipation 05/03/2013  . Contusion of face, scalp, and neck except eye(s) 06/01/2012  . Contusion of wrist 06/01/2012  . COPD (chronic obstructive pulmonary disease) (HCC)  [place], mild ([place] 2[place]2014  . Edema 04/20/2012  . H/O: CVA (cerebrovascular accident)   . Hearing loss 09/26/2014  . History of cancer of uterus   . HTN (hypertension), benign   . Hyperglycemia 11/14/2015  . Hypothyroidism   . Insomnia, unspecified 08/10/2012  . Open wound of knee, leg (except thigh), and ankle, without mention of complication 5/9/208/0/9983her and unspecified hyperlipidemia 06/01/2012  . Other disorder of coccyx 01/27/2012  . Pain in joint, ankle and foot 06/14/2012  . Peripheral vascular disease, unspecified (HCC) 2[place]/2013  . Personal history of fall 01/27/2012  . Pneumonia, organism unspecified(486) 01/23/2005  . Rheumatic fever 09/26/1933   Age 89 10/01/14 ESR 13, RAF <10   . Seasonal allergies 05/03/2013  . Stasis dermatitis of both legs 09/26/2014  . Unspecified  constipation 11/02/2012  . Unspecified hearing loss 02/08/2013  . Unspecified hereditary and idiopathic peripheral neuropathy 01/27/2012  . Urinary frequency 02/24/2014  . Ventricular fibrillation ([place])  01/24/2012   Past Surgical History:  Procedure Laterality Date  . ABDOMINAL HYSTERECTOMY  1990   for endometrial cancer  . CATARACT EXTRACTION W/ INTRAOCULAR LENS  IMPLANT, BILATERAL    . [place]  . GUM SURGERY  1932  . MASTOIDECTOMY  1920   bilateral  . THYROIDECTOMY  1960  . TONSILLECTOMY  1916    Allergies  Allergen Reactions  . Amoxicillin     Unknown, patient unable to answer questionnaire   . Aspirin Other (See Comments)    On MAR  . Avelox [Moxifloxacin]     Unknown: listed on MAR  . Erythromycin     Unknown: listed on MAR  . Monistat [Miconazole]     Unknown: listed on MAR  . Morphine And Related     Unknown: listed on MAR  . Orange Juice [Orange Oil]     Unknown: listed on MAR    Allergies as of 12/18/2020      Reactions   Amoxicillin    Unknown, patient unable to answer questionnaire    Aspirin Other (See Comments)   On MAR   Avelox [moxifloxacin]    Unknown: listed on MAR   Erythromycin    Unknown: listed on MAR   Monistat [miconazole]    Unknown: listed on MAR   Morphine And Related    Unknown: listed on MAR   Orange Juice [orange Oil]    Unknown: listed on [facility]      Medication List       Accurate as of December 18, 2020 11:59 PM. If you have any questions, ask your nurse or doctor.        aspirin 81 MG chewable tablet Chew 81 mg by mouth daily.   calcium citrate-vitamin D 315-200 MG-UNIT tablet Commonly known as: CITRACAL+D Take 1 tablet by mouth daily.   carboxymethylcellulose 0.5 % Soln Commonly known as: REFRESH PLUS Place 1 drop into both eyes 2 (two) times daily.   CEROVITE SENIOR PO Take 1 tablet by mouth daily.   docusate sodium 100 MG capsule Commonly known as: COLACE Take 200 mg by mouth at bedtime.   fexofenadine 60 MG tablet Commonly known as: ALLEGRA Take 60 mg by mouth daily.   ipratropium-albuterol 0.5-2.5 (3) MG/3ML Soln Commonly known as: DUONEB Take 3 mLs by nebulization 2 (two) times  daily.   levothyroxine 150 MCG tablet Commonly known as: SYNTHROID Take 150 mcg by mouth daily.   magnesium hydroxide 400 MG/5ML suspension Commonly known as: MILK OF MAGNESIA Take 30 mLs by mouth daily as needed for mild constipation.   melatonin 3 MG Tabs tablet Take 3 mg by mouth at bedtime.   OcuSoft Lid Scrub Allergy Pads Place 1 each into both eyes in the morning and at bedtime. Cleanse bilateral lids for excess eye secretions.   potassium chloride SA 20 MEQ tablet Commonly known as: KLOR-CON Take 20 mEq by mouth daily.   sennosides-docusate sodium 8.6-50 MG tablet Commonly known as: SENOKOT-S Take 2 tablets by mouth at bedtime.   torsemide 20 MG tablet Commonly known as: DEMADEX Take 40 mg by mouth every other day.   torsemide 20 MG tablet Commonly known as: DEMADEX Take 20 mg by mouth every other day.   zinc oxide 20 % ointment Apply 1 application topically daily as needed for  irritation (To buttocks after every incontinent episode and for redness.).       Review of Systems  Constitutional: Negative for fatigue, fever and unexpected weight change.       Weight loss about #5Ibs in the past 2weeks.   HENT: Positive for hearing loss. Negative for congestion and voice change.   Eyes: Negative for visual disturbance.  Respiratory: Positive for shortness of breath. Negative for cough.        DOE  Cardiovascular: Positive for leg swelling.  Gastrointestinal: Negative for abdominal distention, abdominal pain, constipation, diarrhea, nausea and vomiting.  Genitourinary: Negative for difficulty urinating, dysuria and urgency.  Musculoskeletal: Positive for arthralgias and gait problem.  Skin: Negative for color change.       Bruised back of the right hand, right knee  Neurological: Negative for dizziness, speech difficulty and weakness.       Memory lapses.   Psychiatric/Behavioral: Negative for behavioral problems and sleep disturbance. The patient is not  nervous/anxious.     Immunization History  Administered Date(s) Administered  . Influenza Whole 10/19/2012, 09/15/2018  . Influenza, High Dose Seasonal PF 09/14/2019  . Influenza-Unspecified 10/18/2013, 10/17/2014, 09/02/2015, 09/29/2016, 10/03/2017, 09/23/2020  . Moderna Sars-Covid-2 Vaccination 12/15/2019, 01/12/2020  . PPD Test 01/22/2013, 11/28/2015  . Pneumococcal Conjugate-13 11/18/2017  . Pneumococcal Polysaccharide-23 01/22/2013  . Td 02/21/2009  . Tdap 04/07/2015   Pertinent  Health Maintenance Due  Topic Date Due  . INFLUENZA VACCINE  Completed  . PNA vac Low Risk Adult  Completed   Fall Risk  09/05/2018 09/02/2017 11/14/2015 04/17/2015 09/26/2014  Falls in the past year? Yes No No Yes No  Number falls in past yr: 2 or more - - 1 -  Injury with Fall? Yes - - Yes -  Comment - - - laceration forehead 04/07/15 -  Risk Factor Category  - - - High Fall Risk -  Risk for fall due to : - - Impaired mobility;Impaired vision - -   Functional Status Survey:    Vitals:   12/18/20 1641  BP: 114/73  Pulse: 82  Resp: 18  Temp: (!) 97.2 F (36.2 C)  SpO2: 93%  Weight: 135 lb 8 oz (61.5 kg)  Height: 5' (1.524 m)   Body mass index is 26.46 kg/m. Physical Exam Vitals and nursing note reviewed.  Constitutional:      Appearance: Normal appearance.  HENT:     Head: Normocephalic and atraumatic.     Mouth/Throat:     Mouth: Mucous membranes are moist.  Eyes:     Extraocular Movements: Extraocular movements intact.     Pupils: Pupils are equal, round, and reactive to light.  Cardiovascular:     Rate and Rhythm: Normal rate and regular rhythm.     Heart sounds: Murmur heard.    Pulmonary:     Breath sounds: Rales present.     Comments: Bibasilar rales chronic Abdominal:     General: Bowel sounds are normal. There is no distension.     Palpations: Abdomen is soft.     Tenderness: There is no abdominal tenderness. There is no right CVA tenderness, left CVA tenderness,  guarding or rebound.  Musculoskeletal:     Cervical back: Normal range of motion and neck supple.     Right lower leg: Edema present.     Left lower leg: Edema present.     Comments: Trace edema BLE  Skin:    General: Skin is warm and dry.     Findings:  Bruising present.     Comments: Sustained the dorsum right hand and right knee bruise, no warmth/swelling, reduced ROM, or pain palpated. Continue to observe .   Neurological:     General: No focal deficit present.     Mental Status: She is alert. Mental status is at baseline.     Gait: Gait abnormal.     Comments: Oriented to person, place.   Psychiatric:        Mood and Affect: Mood normal.        Behavior: Behavior normal.     Labs reviewed: Recent Labs    06/06/20 0000 06/25/20 0000 10/17/20 0000  NA 139 141 140  K 3.8 4.1 4.3  CL 99 99 97*  CO2 31* 31* 30*  BUN 36* 34* 33*  CREATININE 1.1 1.2* 1.1  CALCIUM 8.8 9.3 9.4   Recent Labs    10/17/20 0000 10/20/20 0000 11/14/20 0000  AST 299* 99* 17  ALT 299* 299* 13  ALKPHOS 160* 171* 71  ALBUMIN 3.8 3.8 3.2*   Recent Labs    06/06/20 0000 06/25/20 0000 10/17/20 0000  WBC 7.9 10.0 5.0  NEUTROABS 4,511 5,880 2,520.00  HGB 12.5 12.9 14.4  HCT 37 40 44  PLT 184 194 220   Lab Results  Component Value Date   TSH 0.47 10/17/2020   No results found for: HGBA1C No results found for: CHOL, HDL, LDLCALC, LDLDIRECT, TRIG, CHOLHDL  Significant Diagnostic Results in last 30 days:  No results found.  Assessment/Plan  Hypothyroidism Hypothyroidism, takes Levothyroxine 177mg qd, TSH0.47 10/17/20   Constipation Constipation, takes Colace 2085mqd, MOM prn, Senokot S II qhs   COPD, mild (HCC) COPD, takes Allegra 6032md, DuoNebbid   Edema of both lower legs due to peripheral venous insufficiency Edema BLE, chronic, takes Torsemide 59m44md, 40mg68m. Bun/creat 33/1.1 10/17/20   Osteoarthritis, multiple sites OA pain, R knee, X-ray negative fx  05/16/20, prn Tylenol.   TIA (transient ischemic attack) TIA ?, Hx of CAD, takes ASA 81mg 52m  Cholelithiasis Elevated LFT off Tylenol, RUQ US,10/21/20 US abdKoreaultiple gallstones in the gallbladder, largest 0.8cm. repeated AST 17(was 299), ALT 13(was 299),  total bilirubin 0.3(was 1.7), Alk phos 71(was 160) 11/14/20   Family/ staff Communication: plan of care reviewed with the patient and charge nurse.  Labs/tests ordered: none  Time spend 35 minutes.

## 2020-12-18 NOTE — Assessment & Plan Note (Signed)
Elevated LFT off Tylenol, RUQ US,10/21/20 US abd multiple gallstones in the gallbladder, largest 0.8cm. repeated AST17(was 299), ALT13(was 299),total bilirubin 0.3(was 1.7), Alk phos71(was 160)11/14/20   

## 2020-12-19 ENCOUNTER — Encounter: Payer: Self-pay | Admitting: Nurse Practitioner

## 2020-12-31 ENCOUNTER — Non-Acute Institutional Stay (SKILLED_NURSING_FACILITY): Payer: Medicare PPO | Admitting: Nurse Practitioner

## 2020-12-31 ENCOUNTER — Encounter: Payer: Self-pay | Admitting: Nurse Practitioner

## 2020-12-31 DIAGNOSIS — R600 Localized edema: Secondary | ICD-10-CM

## 2020-12-31 DIAGNOSIS — G459 Transient cerebral ischemic attack, unspecified: Secondary | ICD-10-CM

## 2020-12-31 DIAGNOSIS — J449 Chronic obstructive pulmonary disease, unspecified: Secondary | ICD-10-CM

## 2020-12-31 DIAGNOSIS — K5900 Constipation, unspecified: Secondary | ICD-10-CM

## 2020-12-31 DIAGNOSIS — R2681 Unsteadiness on feet: Secondary | ICD-10-CM

## 2020-12-31 DIAGNOSIS — M8949 Other hypertrophic osteoarthropathy, multiple sites: Secondary | ICD-10-CM | POA: Diagnosis not present

## 2020-12-31 DIAGNOSIS — R296 Repeated falls: Secondary | ICD-10-CM

## 2020-12-31 DIAGNOSIS — R413 Other amnesia: Secondary | ICD-10-CM | POA: Insufficient documentation

## 2020-12-31 DIAGNOSIS — I872 Venous insufficiency (chronic) (peripheral): Secondary | ICD-10-CM | POA: Diagnosis not present

## 2020-12-31 DIAGNOSIS — E890 Postprocedural hypothyroidism: Secondary | ICD-10-CM | POA: Diagnosis not present

## 2020-12-31 DIAGNOSIS — M159 Polyosteoarthritis, unspecified: Secondary | ICD-10-CM

## 2020-12-31 DIAGNOSIS — K8018 Calculus of gallbladder with other cholecystitis without obstruction: Secondary | ICD-10-CM

## 2020-12-31 NOTE — Assessment & Plan Note (Signed)
TIA ?, Hx of CAD, takes ASA 81mg qd. 

## 2020-12-31 NOTE — Assessment & Plan Note (Signed)
Gait abnormality, wobbly gait with walker, needs assistance with transfer.

## 2020-12-31 NOTE — Assessment & Plan Note (Addendum)
unwitnessed fall 12/30/20 when the patient was found sitting on the floor, c/o scalp pain with finding of a small scalp hematoma back of the left head. The patient denied chest pain, SOB, palpitation, dizziness, change of vision, or focal weakness associated with the event. The patient stated she attempted to transfer, walking with walker, then fall when she lost her balance.  Close supervision, assistance for safety

## 2020-12-31 NOTE — Progress Notes (Signed)
Location:   SNF [place] Room Number: 28 Place of Service:  SNF (31) Provider: [facility] Clovis Mankins NP  Tracie Dad, MD  Patient Care Team: Tracie Dad, MD as PCP - General (Internal Medicine) [place] [place] [name] X, NP as Nurse Practitioner (Nurse Practitioner)  Extended Emergency Contact Information Primary Emergency Contact: [name] [place] of [place] Phone: [phone] Relation: Son Secondary Emergency Contact: [place] Phone: [phone] Mobile Phone: [phone] Relation: Daughter  Code Status: DNR Goals of care: Advanced Directive information Advanced Directives 12/31/2020  Does Patient Have a Medical Advance Directive? Yes  Type of Paramedic of [place];Living will;Out of facility DNR (pink MOST or yellow form)  Does patient want to make changes to medical advance directive? No - Patient declined  Copy of [place] in Chart? Yes - validated most recent copy scanned in chart (See row information)  Would patient like information on creating a medical advance directive? -  Pre-existing out of facility DNR order (yellow form or pink MOST form) Pink MOST form placed in chart (order not valid for inpatient use)     Chief Complaint  Patient presents with  . Acute Visit    Fall    HPI:  Pt is a 87 y.o. female seen today for an acute visit for unwitnessed fall 12/30/20 when the patient was found sitting on the floor, c/o scalp pain with finding of a small scalp hematoma. The patient denied chest pain, SOB, palpitation, dizziness, change of vision, or focal weakness associated with the event. The patient stated she attempted to transfer, walking with walker, then fall when she lost her balance.   Gait abnormality, wobbly gait with walker, needs assistance with transfer.   Memory deficit, needs remainders, verbal cues frequently.   Hypothyroidism, takes  Levothyroxine 145mg qd, TSH0.47 10/17/20 Constipation, takes Colace 2034mqd, MOM prn, Senokot S II qhs COPD, takes Allegra 6072md, DuoNebbid Edema BLE, chronic, takes Torsemide 73m35md, 40mg82m. Bun/creat 33/1.1 10/17/20 OA pain, R knee, X-ray negative fx 05/16/20, prn Tylenol. TIA ?, Hx of CAD, takes ASA 81mg 23mElevated LFT off Tylenol, RUQ US,10/21/20 US abdKoreaultiple gallstones in the gallbladder, largest 0.8cm. repeated AST 17(was 299), ALT 13(was 299),  total bilirubin 0.3(was 1.7), Alk phos 71(was 160) 11/14/20      Past Medical History:  Diagnosis Date  . Abnormal liver function tests    11/11/15 AST 31, ALT 88, alk phos 96   . Acute kidney failure, unspecified (HCC) [place]/2013  . Arthralgia 09/26/2014   Multiple joints: knees, shoulders, wrists, spine hips   . Arthritis   . Candidiasis of other urogenital sites 08/10/2012  . Cerebral embolism 05/23/2005  . CHF (congestive heart failure) (HCC)  [place] 11/[place]/2015  . Closed fracture of sacrum and coccyx without mention of spinal cord injury 02/14/2012  . Constipation 05/03/2013  . Contusion of face, scalp, and neck except eye(s) 06/01/2012  . Contusion of wrist 06/01/2012  . COPD (chronic obstructive pulmonary disease) (HCC)  [place], [place] (H[place] 2[place]  . Edema 04/20/2012  . H/O: CVA (cerebrovascular accident)   . Hearing loss 09/26/2014  . History of cancer of uterus   . HTN (hypertension), benign   . Hyperglycemia 11/14/2015  . Hypothyroidism   . Insomnia, unspecified 08/10/2012  . Open wound of knee, leg (except thigh), and ankle, without mention of complication 5/9/205/2/8413her and unspecified hyperlipidemia 06/01/2012  .  Other disorder of coccyx 01/27/2012  . Pain in joint, ankle and foot 06/14/2012  . Peripheral vascular disease, unspecified ([place]) 01/27/2012  . Personal history of fall 01/27/2012  . Pneumonia, organism unspecified(486)  01/23/2005  . Rheumatic fever 09/26/1933   Age 87 10/01/14 ESR 13, RAF <10   . Seasonal allergies 05/03/2013  . Stasis dermatitis of both legs 09/26/2014  . Unspecified constipation 11/02/2012  . Unspecified hearing loss 02/08/2013  . Unspecified hereditary and idiopathic peripheral neuropathy 01/27/2012  . Urinary frequency 02/24/2014  . Ventricular fibrillation ([place]) 01/24/2012   Past Surgical History:  Procedure Laterality Date  . ABDOMINAL HYSTERECTOMY  1990   for endometrial cancer  . CATARACT EXTRACTION W/ INTRAOCULAR LENS  IMPLANT, BILATERAL    . [place]  . GUM SURGERY  1932  . MASTOIDECTOMY  1920   bilateral  . THYROIDECTOMY  1960  . TONSILLECTOMY  1916    Allergies  Allergen Reactions  . Amoxicillin     Unknown, patient unable to answer questionnaire   . Aspirin Other (See Comments)    On MAR  . Avelox [Moxifloxacin]     Unknown: listed on MAR  . Erythromycin     Unknown: listed on MAR  . Monistat [Miconazole]     Unknown: listed on MAR  . Morphine And Related     Unknown: listed on MAR  . Orange Juice [Orange Oil]     Unknown: listed on MAR    Allergies as of 12/31/2020      Reactions   Amoxicillin    Unknown, patient unable to answer questionnaire    Aspirin Other (See Comments)   On MAR   Avelox [moxifloxacin]    Unknown: listed on MAR   Erythromycin    Unknown: listed on MAR   Monistat [miconazole]    Unknown: listed on MAR   Morphine And Related    Unknown: listed on MAR   Orange Juice [orange Oil]    Unknown: listed on [facility]      Medication List       Accurate as of December 31, 2020 11:59 PM. If you have any questions, ask your nurse or doctor.        STOP taking these medications   magnesium hydroxide 400 MG/5ML suspension Commonly known as: MILK OF MAGNESIA Stopped by: Takyla Kuchera X Daryll Spisak, NP     TAKE these medications   acetaminophen 325 MG tablet Commonly known as: TYLENOL Take 650 mg by mouth every 4 (four) hours as  needed.   aspirin 81 MG chewable tablet Chew 81 mg by mouth daily.   calcium citrate-vitamin D 315-200 MG-UNIT tablet Commonly known as: CITRACAL+D Take 1 tablet by mouth daily.   carboxymethylcellulose 0.5 % Soln Commonly known as: REFRESH PLUS Place 1 drop into both eyes 2 (two) times daily.   CEROVITE SENIOR PO Take 1 tablet by mouth daily.   docusate sodium 100 MG capsule Commonly known as: COLACE Take 200 mg by mouth at bedtime.   fexofenadine 60 MG tablet Commonly known as: ALLEGRA Take 60 mg by mouth daily.   ipratropium-albuterol 0.5-2.5 (3) MG/3ML Soln Commonly known as: DUONEB Take 3 mLs by nebulization 2 (two) times daily.   levothyroxine 150 MCG tablet Commonly known as: SYNTHROID Take 150 mcg by mouth daily.   melatonin 3 MG Tabs tablet Take 3 mg by mouth at bedtime.   OcuSoft Lid Scrub Allergy Pads Place 1 each into both eyes in the morning and at  bedtime. Cleanse bilateral lids for excess eye secretions.   potassium chloride SA 20 MEQ tablet Commonly known as: KLOR-CON Take 20 mEq by mouth daily.   sennosides-docusate sodium 8.6-50 MG tablet Commonly known as: SENOKOT-S Take 2 tablets by mouth at bedtime.   torsemide 20 MG tablet Commonly known as: DEMADEX Take 40 mg by mouth every other day.   torsemide 20 MG tablet Commonly known as: DEMADEX Take 20 mg by mouth every other day.   zinc oxide 20 % ointment Apply 1 application topically daily as needed for irritation (To buttocks after every incontinent episode and for redness.).       Review of Systems  Constitutional: Negative for activity change, appetite change and fever.       Weight loss about #5Ibs in the past 2weeks.   HENT: Positive for hearing loss. Negative for congestion and voice change.   Eyes: Negative for visual disturbance.  Respiratory: Positive for shortness of breath. Negative for cough.        DOE  Cardiovascular: Positive for leg swelling.  Gastrointestinal:  Negative for abdominal pain and constipation.  Genitourinary: Negative for dysuria and urgency.  Musculoskeletal: Positive for arthralgias and gait problem.  Skin: Negative for color change.       Scalp hematoma.   Neurological: Negative for speech difficulty, weakness, light-headedness and headaches.       Memory lapses.   Psychiatric/Behavioral: Negative for behavioral problems and sleep disturbance. The patient is not nervous/anxious.     Immunization History  Administered Date(s) Administered  . Influenza Whole 10/19/2012, 09/15/2018  . Influenza, High Dose Seasonal PF 09/14/2019  . Influenza-Unspecified 10/18/2013, 10/17/2014, 09/02/2015, 09/29/2016, 10/03/2017, 09/23/2020  . Moderna Sars-Covid-2 Vaccination 12/15/2019, 01/12/2020, 10/21/2020  . PPD Test 01/22/2013, 11/28/2015  . Pneumococcal Conjugate-13 11/18/2017  . Pneumococcal Polysaccharide-23 01/22/2013  . Td 02/21/2009  . Tdap 04/07/2015   Pertinent  Health Maintenance Due  Topic Date Due  . INFLUENZA VACCINE  Completed  . PNA vac Low Risk Adult  Completed   Fall Risk  09/05/2018 09/02/2017 11/14/2015 04/17/2015 09/26/2014  Falls in the past year? Yes No No Yes No  Number falls in past yr: 2 or more - - 1 -  Injury with Fall? Yes - - Yes -  Comment - - - laceration forehead 04/07/15 -  Risk Factor Category  - - - High Fall Risk -  Risk for fall due to : - - Impaired mobility;Impaired vision - -   Functional Status Survey:    Vitals:   12/31/20 1354  BP: (!) 164/76  Pulse: 91  Resp: 18  Temp: (!) 96.9 F (36.1 C)  SpO2: 95%  Weight: 133 lb 6.4 oz (60.5 kg)  Height: 5' (1.524 m)   Body mass index is 26.05 kg/m. Physical Exam Vitals and nursing note reviewed.  Constitutional:      Appearance: Normal appearance.  HENT:     Head: Normocephalic and atraumatic.     Mouth/Throat:     Mouth: Mucous membranes are moist.  Eyes:     Extraocular Movements: Extraocular movements intact.     Pupils: Pupils are  equal, round, and reactive to light.  Cardiovascular:     Rate and Rhythm: Normal rate and regular rhythm.     Heart sounds: Murmur heard.    Pulmonary:     Breath sounds: Rales present.     Comments: Bibasilar rales chronic Abdominal:     General: Bowel sounds are normal. There is no distension.  Palpations: Abdomen is soft.     Tenderness: There is no abdominal tenderness. There is no right CVA tenderness, left CVA tenderness, guarding or rebound.  Musculoskeletal:     Cervical back: Normal range of motion and neck supple.     Right lower leg: Edema present.     Left lower leg: Edema present.     Comments: Trace edema BLE  Skin:    General: Skin is warm and dry.     Comments: Scalp hematoma about a marble sized left occiput.   Neurological:     General: No focal deficit present.     Mental Status: She is alert. Mental status is at baseline.     Gait: Gait abnormal.     Comments: Oriented to person, place.   Psychiatric:        Mood and Affect: Mood normal.        Behavior: Behavior normal.     Labs reviewed: Recent Labs    06/06/20 0000 06/25/20 0000 10/17/20 0000  NA 139 141 140  K 3.8 4.1 4.3  CL 99 99 97*  CO2 31* 31* 30*  BUN 36* 34* 33*  CREATININE 1.1 1.2* 1.1  CALCIUM 8.8 9.3 9.4   Recent Labs    10/17/20 0000 10/20/20 0000 11/14/20 0000  AST 299* 99* 17  ALT 299* 299* 13  ALKPHOS 160* 171* 71  ALBUMIN 3.8 3.8 3.2*   Recent Labs    06/06/20 0000 06/25/20 0000 10/17/20 0000  WBC 7.9 10.0 5.0  NEUTROABS 4,511 5,880 2,520.00  HGB 12.5 12.9 14.4  HCT 37 40 44  PLT 184 194 220   Lab Results  Component Value Date   TSH 0.47 10/17/2020   No results found for: HGBA1C No results found for: CHOL, HDL, LDLCALC, LDLDIRECT, TRIG, CHOLHDL  Significant Diagnostic Results in last 30 days:  No results found.  Assessment/Plan: Frequent falls unwitnessed fall 12/30/20 when the patient was found sitting on the floor, c/o scalp pain with finding of  a small scalp hematoma back of the left head. The patient denied chest pain, SOB, palpitation, dizziness, change of vision, or focal weakness associated with the event. The patient stated she attempted to transfer, walking with walker, then fall when she lost her balance.  Close supervision, assistance for safety  Gait instability Gait abnormality, wobbly gait with walker, needs assistance with transfer.   Memory deficits Memory deficit, needs remainders, verbal cues frequently.   Hypothyroidism Hypothyroidism, takes Levothyroxine 115mg qd, TSH0.47 10/17/20  Constipation Constipation, takes Colace 2034mqd, MOM prn, Senokot S II qhs   COPD, mild (HCC) COPD, takes Allegra 6071md, DuoNebbid   Edema of both lower legs due to peripheral venous insufficiency Edema BLE, chronic, takes Torsemide 62m26md, 40mg24m. Bun/creat 33/1.1 10/17/20  Osteoarthritis, multiple sites OA pain, R knee, X-ray negative fx 05/16/20, prn Tylenol.   TIA (transient ischemic attack) TIA ?, Hx of CAD, takes ASA 81mg 22m  Cholelithiasis Elevated LFT off Tylenol, RUQ US,10/21/20 US abdKoreaultiple gallstones in the gallbladder, largest 0.8cm. repeated AST 17(was 299), ALT 13(was 299),  total bilirubin 0.3(was 1.7), Alk phos 71(was 160) 11/14/20     Family/ staff Communication: plan of care reviewed with the patient and charge nurse.   Labs/tests ordered:  None  Time spend 35 minutes.

## 2020-12-31 NOTE — Assessment & Plan Note (Signed)
COPD, takes Allegra 60mg  qd, DuoNebbid

## 2020-12-31 NOTE — Assessment & Plan Note (Signed)
Edema BLE, chronic, takes Torsemide 20mg  qod, 40mg  qod. Bun/creat 33/1.1 10/17/20

## 2020-12-31 NOTE — Assessment & Plan Note (Signed)
Elevated LFT off Tylenol, RUQ US,10/21/20 US abd multiple gallstones in the gallbladder, largest 0.8cm. repeated AST17(was 299), ALT13(was 299),total bilirubin 0.3(was 1.7), Alk phos71(was 160)11/14/20   

## 2020-12-31 NOTE — Assessment & Plan Note (Signed)
Hypothyroidism, takes Levothyroxine qd, TSH0.47 10/17/20

## 2020-12-31 NOTE — Assessment & Plan Note (Signed)
Constipation, takes Colace 200mg  qd, MOM prn, Senokot S II qhs

## 2020-12-31 NOTE — Assessment & Plan Note (Signed)
Memory deficit, needs remainders, verbal cues frequently.

## 2020-12-31 NOTE — Assessment & Plan Note (Signed)
OA pain, R knee, X-ray negative fx 05/16/20, prn Tylenol.

## 2021-01-01 ENCOUNTER — Encounter: Payer: Self-pay | Admitting: Nurse Practitioner

## 2021-01-20 ENCOUNTER — Encounter: Payer: Self-pay | Admitting: Internal Medicine

## 2021-01-20 ENCOUNTER — Non-Acute Institutional Stay (SKILLED_NURSING_FACILITY): Payer: Medicare PPO | Admitting: Internal Medicine

## 2021-01-20 DIAGNOSIS — R300 Dysuria: Secondary | ICD-10-CM | POA: Diagnosis not present

## 2021-01-20 NOTE — Progress Notes (Signed)
Location:   [place] Room Number: 28 Place of Service:  SNF [phone]) Provider:  Veleta Miners MD  [name], MD  Patient Care Team: [name], MD as PCP - General (Internal Medicine) [place] [place] Mast, Man X, NP as Nurse Practitioner (Nurse Practitioner)  Extended Emergency Contact Information Primary Emergency Contact: [name] States of [place] Phone: [phone] Relation: Son Secondary Emergency Contact: [place] Phone: [phone] Mobile Phone: [phone] Relation: Daughter  Code Status:  DNR Managed Care Goals of care: Advanced Directive information Advanced Directives 12/31/2020  Does Patient Have a Medical Advance Directive? Yes  Type of Paramedic of [place];Living will;Out of facility DNR (pink MOST or yellow form)  Does patient want to make changes to medical advance directive? No - Patient declined  Copy of [place] in Chart? Yes - validated most recent copy scanned in chart (See row information)  Would patient like information on creating a medical advance directive? -  Pre-existing out of facility DNR order (yellow form or pink MOST form) Pink MOST form placed in chart (order not valid for inpatient use)     Chief Complaint  Patient presents with   Acute Visit    Urinary frequency     HPI:  Pt is a 87 y.o. female seen today for an acute visit for Urinary Frequency Dysuria  Patient has h/o Bilateral LE edema , Hypertension, Hard of hearing, h/o Cellulitis, Hypothyroidism, COPD, Chronic Conjunctivitis  C/o incrased Frequency and c/o Dysuria No Fever or Abdominal Pain No Nausea Very difficult to get history due to hard of hearing  Past Medical History:  Diagnosis Date   Abnormal liver function tests    11/11/15 AST 31, ALT 88, alk phos 96    Acute kidney failure, unspecified ([place]) 01/24/2012    Arthralgia 09/26/2014   Multiple joints: knees, shoulders, wrists, spine hips    Arthritis    Candidiasis of other urogenital sites 08/10/2012   Cerebral embolism 05/23/2005   CHF (congestive heart failure) ([place])    Cholelithiasis 11/04/2015   Closed fracture of sacrum and coccyx without mention of spinal cord injury 02/14/2012   Constipation 05/03/2013   Contusion of face, scalp, and neck except eye(s) 06/01/2012   Contusion of wrist 06/01/2012   COPD (chronic obstructive pulmonary disease) ([place])    COPD, mild ([place]) 01/20/2013   Edema 04/20/2012   H/O: CVA (cerebrovascular accident)    Hearing loss 09/26/2014   History of cancer of uterus    HTN (hypertension), benign    Hyperglycemia 11/14/2015   Hypothyroidism    Insomnia, unspecified 08/10/2012   Open wound of knee, leg (except thigh), and ankle, without mention of complication 0/0/9381   Other and unspecified hyperlipidemia 06/01/2012   Other disorder of coccyx 01/27/2012   Pain in joint, ankle and foot 06/14/2012   Peripheral vascular disease, unspecified ([place]) 01/27/2012   Personal history of fall 01/27/2012   Pneumonia, organism unspecified(486) 01/23/2005   Rheumatic fever 09/26/1933   Age 88 10/01/14 ESR 13, RAF <10    Seasonal allergies 05/03/2013   Stasis dermatitis of both legs 09/26/2014   Unspecified constipation 11/02/2012   Unspecified hearing loss 02/08/2013   Unspecified hereditary and idiopathic peripheral neuropathy 01/27/2012   Urinary frequency 02/24/2014   Ventricular fibrillation ([place]) 01/24/2012   Past Surgical History:  Procedure Laterality Date   ABDOMINAL HYSTERECTOMY  1990   for endometrial cancer   CATARACT EXTRACTION  W/ INTRAOCULAR LENS  IMPLANT, BILATERAL     ECTOPIC PREGNANCY SURGERY  1952   GUM SURGERY  1932   MASTOIDECTOMY  1920   bilateral   THYROIDECTOMY  1960   TONSILLECTOMY  1916    Allergies  Allergen Reactions   Amoxicillin     Unknown, patient unable to  answer questionnaire    Aspirin Other (See Comments)    On MAR   Avelox [Moxifloxacin]     Unknown: listed on MAR   Erythromycin     Unknown: listed on MAR   Monistat [Miconazole]     Unknown: listed on MAR   Morphine And Related     Unknown: listed on MAR   Orange Juice [Orange Oil]     Unknown: listed on MAR    Allergies as of 01/20/2021      Reactions   Amoxicillin    Unknown, patient unable to answer questionnaire    Aspirin Other (See Comments)   On MAR   Avelox [moxifloxacin]    Unknown: listed on MAR   Erythromycin    Unknown: listed on MAR   Monistat [miconazole]    Unknown: listed on MAR   Morphine And Related    Unknown: listed on MAR   Orange Juice [orange Oil]    Unknown: listed on [facility]      Medication List       Accurate as of January 20, 2021 10:55 AM. If you have any questions, ask your nurse or doctor.        STOP taking these medications   acetaminophen 325 MG tablet Commonly known as: TYLENOL Stopped by: [name], MD     TAKE these medications   aspirin 81 MG chewable tablet Chew 81 mg by mouth daily.   calcium citrate-vitamin D 315-200 MG-UNIT tablet Commonly known as: CITRACAL+D Take 1 tablet by mouth daily.   carboxymethylcellulose 0.5 % Soln Commonly known as: REFRESH PLUS Place 1 drop into both eyes 2 (two) times daily.   CEROVITE SENIOR PO Take 1 tablet by mouth daily.   docusate sodium 100 MG capsule Commonly known as: COLACE Take 200 mg by mouth at bedtime.   fexofenadine 60 MG tablet Commonly known as: ALLEGRA Take 60 mg by mouth daily.   ipratropium-albuterol 0.5-2.5 (3) MG/3ML Soln Commonly known as: DUONEB Take 3 mLs by nebulization 2 (two) times daily.   levothyroxine 150 MCG tablet Commonly known as: SYNTHROID Take 150 mcg by mouth daily.   melatonin 3 MG Tabs tablet Take 3 mg by mouth at bedtime.   OcuSoft Lid Scrub Allergy Pads Place 1 each into both eyes in the morning and at bedtime. Cleanse  bilateral lids for excess eye secretions.   potassium chloride SA 20 MEQ tablet Commonly known as: KLOR-CON Take 20 mEq by mouth daily.   sennosides-docusate sodium 8.6-50 MG tablet Commonly known as: SENOKOT-S Take 2 tablets by mouth at bedtime.   torsemide 20 MG tablet Commonly known as: DEMADEX Take 40 mg by mouth every other day.   torsemide 20 MG tablet Commonly known as: DEMADEX Take 20 mg by mouth every other day.   zinc oxide 20 % ointment Apply 1 application topically daily as needed for irritation (To buttocks after every incontinent episode and for redness.).       Review of Systems  Constitutional: Positive for unexpected weight change.  HENT: Negative.   Respiratory: Negative.   Cardiovascular: Positive for leg swelling.  Gastrointestinal: Negative.   Genitourinary: Positive  for dysuria, frequency and urgency.  Musculoskeletal: Positive for gait problem.  Neurological: Positive for weakness.  Psychiatric/Behavioral: Negative.     Immunization History  Administered Date(s) Administered   Influenza Whole 10/19/2012, 09/15/2018   Influenza, High Dose Seasonal PF 09/14/2019   Influenza-Unspecified 10/18/2013, 10/17/2014, 09/02/2015, 09/29/2016, 10/03/2017, 09/23/2020   Moderna Sars-Covid-2 Vaccination 12/15/2019, 01/12/2020, 10/21/2020   PPD Test 01/22/2013, 11/28/2015   Pneumococcal Conjugate-13 11/18/2017   Pneumococcal Polysaccharide-23 01/22/2013   Td 02/21/2009   Tdap 04/07/2015   Pertinent  Health Maintenance Due  Topic Date Due   INFLUENZA VACCINE  Completed   PNA vac Low Risk Adult  Completed   Fall Risk  09/05/2018 09/02/2017 11/14/2015 04/17/2015 09/26/2014  Falls in the past year? Yes No No Yes No  Number falls in past yr: 2 or more - - 1 -  Injury with Fall? Yes - - Yes -  Comment - - - laceration forehead 04/07/15 -  Risk Factor Category  - - - High Fall Risk -  Risk for fall due to : - - Impaired mobility;Impaired vision - -    Functional Status Survey:    Vitals:   01/20/21 1043  BP: 130/60  Pulse: 75  Resp: 18  Temp: 97.7 F (36.5 C)  SpO2: 93%  Weight: 129 lb 12.8 oz (58.9 kg)  Height: 5' (1.524 m)   Body mass index is 25.35 kg/m. Physical Exam Vitals reviewed.  Constitutional:      Appearance: Normal appearance.  HENT:     Head: Normocephalic.     Nose: Nose normal.     Mouth/Throat:     Mouth: Mucous membranes are moist.     Pharynx: Oropharynx is clear.  Eyes:     Pupils: Pupils are equal, round, and reactive to light.  Cardiovascular:     Rate and Rhythm: Normal rate.     Pulses: Normal pulses.  Pulmonary:     Effort: Pulmonary effort is normal.  Abdominal:     General: Abdomen is flat. Bowel sounds are normal.     Palpations: Abdomen is soft.     Tenderness: There is no abdominal tenderness.  Musculoskeletal:        General: Swelling present.     Cervical back: Neck supple.  Skin:    General: Skin is warm.  Neurological:     General: No focal deficit present.     Mental Status: She is alert.  Psychiatric:        Mood and Affect: Mood normal.        Thought Content: Thought content normal.     Labs reviewed: Recent Labs    06/06/20 0000 06/25/20 0000 10/17/20 0000  NA 139 141 140  K 3.8 4.1 4.3  CL 99 99 97*  CO2 31* 31* 30*  BUN 36* 34* 33*  CREATININE 1.1 1.2* 1.1  CALCIUM 8.8 9.3 9.4   Recent Labs    10/17/20 0000 10/20/20 0000 11/14/20 0000  AST 299* 99* 17  ALT 299* 299* 13  ALKPHOS 160* 171* 71  ALBUMIN 3.8 3.8 3.2*   Recent Labs    06/06/20 0000 06/25/20 0000 10/17/20 0000  WBC 7.9 10.0 5.0  NEUTROABS 4,511 5,880 2,520.00  HGB 12.5 12.9 14.4  HCT 37 40 44  PLT 184 194 220   Lab Results  Component Value Date   TSH 0.47 10/17/2020   No results found for: HGBA1C No results found for: CHOL, HDL, LDLCALC, LDLDIRECT, TRIG, CHOLHDL  Significant Diagnostic Results  in last 30 days:  No results found.  Assessment/Plan Dysuria with  Urgency and Frequency Will Send Urine for Analysis and Culture  Edema of both lower legs due to peripheral venous insufficiency On Demadex BUN and Creat are stab;e TIA (transient ischemic attack) On Low dose of Aspirin COPD, mild (HCC) Nebs PRN Hypothyroidism TSH Normal 11/21  Family/ staff Communication:   Labs/tests ordered:

## 2021-01-21 ENCOUNTER — Encounter: Payer: Self-pay | Admitting: Nurse Practitioner

## 2021-01-21 DIAGNOSIS — N390 Urinary tract infection, site not specified: Secondary | ICD-10-CM | POA: Diagnosis not present

## 2021-01-21 NOTE — Progress Notes (Signed)
This encounter was created in error - please disregard.

## 2021-01-22 ENCOUNTER — Non-Acute Institutional Stay (SKILLED_NURSING_FACILITY): Payer: Medicare PPO | Admitting: Nurse Practitioner

## 2021-01-22 ENCOUNTER — Encounter: Payer: Self-pay | Admitting: Nurse Practitioner

## 2021-01-22 DIAGNOSIS — K8018 Calculus of gallbladder with other cholecystitis without obstruction: Secondary | ICD-10-CM | POA: Diagnosis not present

## 2021-01-22 DIAGNOSIS — J449 Chronic obstructive pulmonary disease, unspecified: Secondary | ICD-10-CM | POA: Diagnosis not present

## 2021-01-22 DIAGNOSIS — M8949 Other hypertrophic osteoarthropathy, multiple sites: Secondary | ICD-10-CM

## 2021-01-22 DIAGNOSIS — R413 Other amnesia: Secondary | ICD-10-CM

## 2021-01-22 DIAGNOSIS — I872 Venous insufficiency (chronic) (peripheral): Secondary | ICD-10-CM

## 2021-01-22 DIAGNOSIS — R2681 Unsteadiness on feet: Secondary | ICD-10-CM

## 2021-01-22 DIAGNOSIS — R300 Dysuria: Secondary | ICD-10-CM | POA: Insufficient documentation

## 2021-01-22 DIAGNOSIS — R600 Localized edema: Secondary | ICD-10-CM

## 2021-01-22 DIAGNOSIS — E890 Postprocedural hypothyroidism: Secondary | ICD-10-CM | POA: Diagnosis not present

## 2021-01-22 DIAGNOSIS — K5900 Constipation, unspecified: Secondary | ICD-10-CM | POA: Diagnosis not present

## 2021-01-22 DIAGNOSIS — G459 Transient cerebral ischemic attack, unspecified: Secondary | ICD-10-CM

## 2021-01-22 DIAGNOSIS — M159 Polyosteoarthritis, unspecified: Secondary | ICD-10-CM

## 2021-01-22 DIAGNOSIS — R296 Repeated falls: Secondary | ICD-10-CM

## 2021-01-22 NOTE — Assessment & Plan Note (Signed)
TIA ?, Hx of CAD, takes ASA 81mg  qd.

## 2021-01-22 NOTE — Assessment & Plan Note (Signed)
01/20/21, not today, pending UA C/S, afebrile.

## 2021-01-22 NOTE — Assessment & Plan Note (Signed)
R knee, X-ray negative fx 05/16/20, prn Tylenol.

## 2021-01-22 NOTE — Assessment & Plan Note (Signed)
needs remainders, verbal cues frequently.

## 2021-01-22 NOTE — Assessment & Plan Note (Signed)
Elevated LFT off Tylenol, RUQ US,10/21/20 US abd multiple gallstones in the gallbladder, largest 0.8cm. repeated AST17(was 299), ALT13(was 299),total bilirubin 0.3(was 1.7), Alk phos71(was 160)11/14/20   

## 2021-01-22 NOTE — Assessment & Plan Note (Signed)
frailty, needs assistance/supervision for transfer/ambulation, doesn't call for assistance consistently.

## 2021-01-22 NOTE — Assessment & Plan Note (Signed)
takes Allegra 60mg  qd, DuoNebbid

## 2021-01-22 NOTE — Assessment & Plan Note (Signed)
takes Levothyroxine qd, TSH0.47 10/17/20

## 2021-01-22 NOTE — Assessment & Plan Note (Signed)
takes Colace 200mg  qd, MOM prn, Senokot S II qhs

## 2021-01-22 NOTE — Assessment & Plan Note (Signed)
wobbly gait with walker, needs assistance with transfer.

## 2021-01-22 NOTE — Progress Notes (Signed)
Location:   [place] Room Number: 28 Place of Service:  SNF (31) Provider:  Marda Stalker, Lennie Odor NP  [name], MD  Patient Care Team: [name], MD as PCP - General (Internal Medicine) [place], [place], [facility] Dimples Probus X, NP as Nurse Practitioner (Nurse Practitioner)  Extended Emergency Contact Information Primary Emergency Contact: [name] States of [place] Phone: [phone] Relation: Son Secondary Emergency Contact: [place] Phone: [phone] Mobile Phone: [phone] Relation: Daughter  Code Status:  DNR Managed Care Goals of care: Advanced Directive information Advanced Directives 12/31/2020  Does Patient Have a Medical Advance Directive? Yes  Type of Paramedic of [place];Living will;Out of facility DNR (pink MOST or yellow form)  Does patient want to make changes to medical advance directive? No - Patient declined  Copy of [place] in Chart? Yes - validated most recent copy scanned in chart (See row information)  Would patient like information on creating a medical advance directive? -  Pre-existing out of facility DNR order (yellow form or pink MOST form) Pink MOST form placed in chart (order not valid for inpatient use)     Chief Complaint  Patient presents with  . Medical Management of Chronic Issues    HPI:  Pt is a 87 y.o. female seen today for medical management of chronic diseases.     C/o dysuria, 01/20/21, not today, pending UA C/S, afebrile.  Frequent falls, frailty, needs assistance/supervision for transfer/ambulation, doesn't call for assistance consistently.   Gait abnormality, wobbly gait with walker, needs assistance with transfer.              Memory deficit, needs remainders, verbal cues frequently.              Hypothyroidism, takes Levothyroxine 134mg qd, TSH0.47 10/17/20 Constipation, takes Colace  2066mqd, MOM prn, Senokot S II qhs COPD, takes Allegra 6054md, DuoNebbid Edema BLE, chronic, takes Torsemide 96m32md, 40mg78m. Bun/creat 33/1.1 10/17/20 OA pain, R knee, X-ray negative fx 05/16/20, prn Tylenol. TIA ?, Hx of CAD, takes ASA 81mg 10mElevated LFT off Tylenol, RUQ US,10/21/20 US abd[place]oreaultiple gallstones in the gallbladder, largest 0.8cm. repeated AST17(was 299), ALT13(was 299),total bilirubin 0.3(was 1.7), Alk phos71(was 160)11/14/20   Past Medical History:  Diagnosis Date  . Abnormal liver function tests    11/11/15 AST 31, ALT 88, alk phos 96   . Acute kidney failure, unspecified (HCC) 2[place]/2013  . Arthralgia 09/26/2014   Multiple joints: knees, shoulders, wrists, spine hips   . Arthritis   . Candidiasis of other urogenital sites 08/10/2012  . Cerebral embolism 05/23/2005  . CHF (congestive heart failure) (HCC)  [place]olelithiasis 11/04/2015  . Closed fracture of sacrum and coccyx without mention of spinal cord injury 02/14/2012  . Constipation 05/03/2013  . Contusion of face, scalp, and neck except eye(s) 06/01/2012  . Contusion of wrist 06/01/2012  . COPD (chronic obstructive pulmonary disease) (HCC)  [place], mild (HCC) 2[place]2014  . Edema 04/20/2012  . H/O: CVA (cerebrovascular accident)   . Hearing loss 09/26/2014  . History of cancer of uterus   . HTN (hypertension), benign   . Hyperglycemia 11/14/2015  . Hypothyroidism   . Insomnia, unspecified 08/10/2012  . Open wound of knee, leg (except thigh), and ankle, without mention of complication 5/9/205/0/3546her and unspecified hyperlipidemia 06/01/2012  . Other disorder of coccyx 01/27/2012  . Pain in joint, ankle and  foot 06/14/2012  . Peripheral vascular disease, unspecified ([place]) 01/27/2012  . Personal history of fall 01/27/2012  . Pneumonia, organism unspecified(486) 01/23/2005  . Rheumatic fever 09/26/1933   Age 41 10/01/14 ESR 13, RAF <10   .  Seasonal allergies 05/03/2013  . Stasis dermatitis of both legs 09/26/2014  . Unspecified constipation 11/02/2012  . Unspecified hearing loss 02/08/2013  . Unspecified hereditary and idiopathic peripheral neuropathy 01/27/2012  . Urinary frequency 02/24/2014  . Ventricular fibrillation ([place]) 01/24/2012   Past Surgical History:  Procedure Laterality Date  . ABDOMINAL HYSTERECTOMY  1990   for endometrial cancer  . CATARACT EXTRACTION W/ INTRAOCULAR LENS  IMPLANT, BILATERAL    . [place]  . GUM SURGERY  1932  . MASTOIDECTOMY  1920   bilateral  . THYROIDECTOMY  1960  . TONSILLECTOMY  1916    Allergies  Allergen Reactions  . Amoxicillin     Unknown, patient unable to answer questionnaire   . Aspirin Other (See Comments)    On MAR  . Avelox [Moxifloxacin]     Unknown: listed on MAR  . Erythromycin     Unknown: listed on MAR  . Monistat [Miconazole]     Unknown: listed on MAR  . Morphine And Related     Unknown: listed on MAR  . Orange Juice [Orange Oil]     Unknown: listed on MAR    Allergies as of 01/22/2021      Reactions   Amoxicillin    Unknown, patient unable to answer questionnaire    Aspirin Other (See Comments)   On MAR   Avelox [moxifloxacin]    Unknown: listed on MAR   Erythromycin    Unknown: listed on MAR   Monistat [miconazole]    Unknown: listed on MAR   Morphine And Related    Unknown: listed on MAR   Orange Juice [orange Oil]    Unknown: listed on [facility]      Medication List       Accurate as of January 22, 2021 11:59 PM. If you have any questions, ask your nurse or doctor.        aspirin 81 MG chewable tablet Chew 81 mg by mouth daily.   calcium citrate-vitamin D 315-200 MG-UNIT tablet Commonly known as: CITRACAL+D Take 1 tablet by mouth daily.   carboxymethylcellulose 0.5 % Soln Commonly known as: REFRESH PLUS Place 1 drop into both eyes 2 (two) times daily.   CEROVITE SENIOR PO Take 1 tablet by mouth daily.    docusate sodium 100 MG capsule Commonly known as: COLACE Take 200 mg by mouth at bedtime.   fexofenadine 60 MG tablet Commonly known as: ALLEGRA Take 60 mg by mouth daily.   ipratropium-albuterol 0.5-2.5 (3) MG/3ML Soln Commonly known as: DUONEB Take 3 mLs by nebulization 2 (two) times daily.   levothyroxine 150 MCG tablet Commonly known as: SYNTHROID Take 150 mcg by mouth daily.   melatonin 3 MG Tabs tablet Take 3 mg by mouth at bedtime.   OcuSoft Lid Scrub Allergy Pads Place 1 each into both eyes in the morning and at bedtime. Cleanse bilateral lids for excess eye secretions.   potassium chloride SA 20 MEQ tablet Commonly known as: KLOR-CON Take 20 mEq by mouth daily.   sennosides-docusate sodium 8.6-50 MG tablet Commonly known as: SENOKOT-S Take 2 tablets by mouth at bedtime.   torsemide 20 MG tablet Commonly known as: DEMADEX Take 40 mg by mouth every other day.   torsemide  20 MG tablet Commonly known as: DEMADEX Take 20 mg by mouth every other day.   zinc oxide 20 % ointment Apply 1 application topically daily as needed for irritation (To buttocks after every incontinent episode and for redness.).       Review of Systems  Constitutional: Negative for fatigue, fever and unexpected weight change.       Weight loss about #5Ibs in the past 2weeks.   HENT: Positive for hearing loss. Negative for congestion and voice change.   Eyes: Negative for visual disturbance.  Respiratory: Positive for shortness of breath. Negative for cough.        DOE  Cardiovascular: Positive for leg swelling.  Gastrointestinal: Negative for abdominal distention, abdominal pain, constipation, nausea and vomiting.  Genitourinary: Positive for frequency. Negative for dysuria and urgency.       Denied dysuria today.   Musculoskeletal: Positive for arthralgias and gait problem.  Skin: Negative for color change.  Neurological: Negative for dizziness, speech difficulty and weakness.        Memory lapses.   Psychiatric/Behavioral: Negative for behavioral problems and sleep disturbance. The patient is not nervous/anxious.     Immunization History  Administered Date(s) Administered  . Influenza Whole 10/19/2012, 09/15/2018  . Influenza, High Dose Seasonal PF 09/14/2019  . Influenza-Unspecified 10/18/2013, 10/17/2014, 09/02/2015, 09/29/2016, 10/03/2017, 09/23/2020  . Moderna Sars-Covid-2 Vaccination 12/15/2019, 01/12/2020, 10/21/2020  . PPD Test 01/22/2013, 11/28/2015  . Pneumococcal Conjugate-13 11/18/2017  . Pneumococcal Polysaccharide-23 01/22/2013  . Td 02/21/2009  . Tdap 04/07/2015   Pertinent  Health Maintenance Due  Topic Date Due  . INFLUENZA VACCINE  Completed  . PNA vac Low Risk Adult  Completed   Fall Risk  09/05/2018 09/02/2017 11/14/2015 04/17/2015 09/26/2014  Falls in the past year? Yes No No Yes No  Number falls in past yr: 2 or more - - 1 -  Injury with Fall? Yes - - Yes -  Comment - - - laceration forehead 04/07/15 -  Risk Factor Category  - - - High Fall Risk -  Risk for fall due to : - - Impaired mobility;Impaired vision - -   Functional Status Survey:    Vitals:   01/22/21 0942  BP: 130/60  Pulse: 75  Resp: 18  Temp: 98 F (36.7 C)  SpO2: 93%  Weight: 129 lb 12.8 oz (58.9 kg)  Height: 5' (1.524 m)   Body mass index is 25.35 kg/m. Physical Exam Vitals and nursing note reviewed.  Constitutional:      Appearance: Normal appearance.  HENT:     Head: Normocephalic and atraumatic.     Mouth/Throat:     Mouth: Mucous membranes are moist.  Eyes:     Extraocular Movements: Extraocular movements intact.     Pupils: Pupils are equal, round, and reactive to light.  Cardiovascular:     Rate and Rhythm: Normal rate and regular rhythm.     Heart sounds: Murmur heard.    Pulmonary:     Breath sounds: Rales present.     Comments: Bibasilar rales chronic Abdominal:     General: Bowel sounds are normal. There is no distension.     Palpations:  Abdomen is soft.     Tenderness: There is no abdominal tenderness. There is no right CVA tenderness, left CVA tenderness, guarding or rebound.  Musculoskeletal:     Cervical back: Normal range of motion and neck supple.     Right lower leg: Edema present.     Left lower leg:  Edema present.     Comments: Trace edema BLE  Skin:    General: Skin is warm and dry.     Comments: Scalp hematoma about a marble sized left occiput.   Neurological:     General: No focal deficit present.     Mental Status: She is alert. Mental status is at baseline.     Gait: Gait abnormal.     Comments: Oriented to person, place.   Psychiatric:        Mood and Affect: Mood normal.        Behavior: Behavior normal.     Labs reviewed: Recent Labs    06/06/20 0000 06/25/20 0000 10/17/20 0000  NA 139 141 140  K 3.8 4.1 4.3  CL 99 99 97*  CO2 31* 31* 30*  BUN 36* 34* 33*  CREATININE 1.1 1.2* 1.1  CALCIUM 8.8 9.3 9.4   Recent Labs    10/17/20 0000 10/20/20 0000 11/14/20 0000  AST 299* 99* 17  ALT 299* 299* 13  ALKPHOS 160* 171* 71  ALBUMIN 3.8 3.8 3.2*   Recent Labs    06/06/20 0000 06/25/20 0000 10/17/20 0000  WBC 7.9 10.0 5.0  NEUTROABS 4,511 5,880 2,520.00  HGB 12.5 12.9 14.4  HCT 37 40 44  PLT 184 194 220   Lab Results  Component Value Date   TSH 0.47 10/17/2020   No results found for: HGBA1C No results found for: CHOL, HDL, LDLCALC, LDLDIRECT, TRIG, CHOLHDL  Significant Diagnostic Results in last 30 days:  No results found.  Assessment/Plan Osteoarthritis, multiple sites R knee, X-ray negative fx 05/16/20, prn Tylenol.   TIA (transient ischemic attack) TIA ?, Hx of CAD, takes ASA 34m qd.  Cholelithiasis Elevated LFT off Tylenol, RUQ US,10/21/20 [place] multiple gallstones in the gallbladder, largest 0.8cm. repeated AST17(was 299), ALT13(was 299),total bilirubin 0.3(was 1.7), Alk phos71(was 160)11/14/20    Edema of both lower legs due to peripheral venous  insufficiency Elevated LFT off Tylenol, RUQ US,10/21/20 [place] multiple gallstones in the gallbladder, largest 0.8cm. repeated AST17(was 299), ALT13(was 299),total bilirubin 0.3(was 1.7), Alk phos71(was 160)11/14/20    COPD, mild (HCC) takes Allegra 673mqd, DuoNebbid   Constipation takes Colace 20034md, MOM prn, Senokot S II qhs  Hypothyroidism  takes Levothyroxine 150m43md, TSH0.47 10/17/20  Memory deficits needs remainders, verbal cues frequently.    Gait instability  wobbly gait with walker, needs assistance with transfer.   Frequent falls frailty, needs assistance/supervision for transfer/ambulation, doesn't call for assistance consistently.  Dysuria 01/20/21, not today, pending UA C/S, afebrile.     Family/ staff Communication: plan of care reviewed with the patient and charge nurse.   Labs/tests ordered:  none  Time spend 35 minutes.

## 2021-01-23 ENCOUNTER — Encounter: Payer: Self-pay | Admitting: Nurse Practitioner

## 2021-02-24 ENCOUNTER — Non-Acute Institutional Stay (SKILLED_NURSING_FACILITY): Payer: Medicare PPO | Admitting: Internal Medicine

## 2021-02-24 ENCOUNTER — Encounter: Payer: Self-pay | Admitting: Internal Medicine

## 2021-02-24 DIAGNOSIS — M8949 Other hypertrophic osteoarthropathy, multiple sites: Secondary | ICD-10-CM

## 2021-02-24 DIAGNOSIS — J449 Chronic obstructive pulmonary disease, unspecified: Secondary | ICD-10-CM

## 2021-02-24 DIAGNOSIS — G459 Transient cerebral ischemic attack, unspecified: Secondary | ICD-10-CM | POA: Diagnosis not present

## 2021-02-24 DIAGNOSIS — E039 Hypothyroidism, unspecified: Secondary | ICD-10-CM | POA: Diagnosis not present

## 2021-02-24 DIAGNOSIS — R600 Localized edema: Secondary | ICD-10-CM | POA: Diagnosis not present

## 2021-02-24 DIAGNOSIS — I872 Venous insufficiency (chronic) (peripheral): Secondary | ICD-10-CM

## 2021-02-24 DIAGNOSIS — M159 Polyosteoarthritis, unspecified: Secondary | ICD-10-CM

## 2021-02-24 NOTE — Progress Notes (Signed)
Location:  [facility] Room Number: 28 Place of Service:  SNF [phone])  Provider: Veleta Miners MD  Code Status: DNR Managed Care Goals of Care:  Advanced Directives 02/24/2021  Does Patient Have a Medical Advance Directive? Yes  Type of Paramedic of [place];Living will;Out of facility DNR (pink MOST or yellow form)  Does patient want to make changes to medical advance directive? -  Copy of [place] in Chart? Yes - validated most recent copy scanned in chart (See row information)  Would patient like information on creating a medical advance directive? -  Pre-existing out of facility DNR order (yellow form or pink MOST form) Pink MOST form placed in chart (order not valid for inpatient use)     Chief Complaint  Patient presents with  . Medical Management of Chronic Issues    HPI: Patient is a 87 y.o. female seen today for medical management of chronic diseases.    Patient has h/o Bilateral LE edema , Hypertension, Hard of hearing, h/o Cellulitis, Hypothyroidism, COPD, Chronic Conjunctivitis has h/o Gall stones with Hepatitis   Weight stable  Very hard of hearing.  Sleeps in her recliner.  Has been sleeping more nowadays. No cough or fever.  Has lower extremity edema but no shortness of breath  Past Medical History:  Diagnosis Date  . Abnormal liver function tests    11/11/15 AST 31, ALT 88, alk phos 96   . Acute kidney failure, unspecified ([place]) 01/24/2012  . Arthralgia 09/26/2014   Multiple joints: knees, shoulders, wrists, spine hips   . Arthritis   . Candidiasis of other urogenital sites 08/10/2012  . Cerebral embolism 05/23/2005  . CHF (congestive heart failure) ([place])   . Cholelithiasis 11/04/2015  . Closed fracture of sacrum and coccyx without mention of spinal cord injury 02/14/2012  . Constipation 05/03/2013  . Contusion of face, scalp, and neck except eye(s) 06/01/2012  . Contusion of wrist 06/01/2012   . COPD (chronic obstructive pulmonary disease) ([place])   . COPD, mild ([place]) 01/20/2013  . Edema 04/20/2012  . H/O: CVA (cerebrovascular accident)   . Hearing loss 09/26/2014  . History of cancer of uterus   . HTN (hypertension), benign   . Hyperglycemia 11/14/2015  . Hypothyroidism   . Insomnia, unspecified 08/10/2012  . Open wound of knee, leg (except thigh), and ankle, without mention of complication 0/01/7252  . Other and unspecified hyperlipidemia 06/01/2012  . Other disorder of coccyx 01/27/2012  . Pain in joint, ankle and foot 06/14/2012  . Peripheral vascular disease, unspecified ([place]) 01/27/2012  . Personal history of fall 01/27/2012  . Pneumonia, organism unspecified(486) 01/23/2005  . Rheumatic fever 09/26/1933   Age 58 10/01/14 ESR 13, RAF <10   . Seasonal allergies 05/03/2013  . Stasis dermatitis of both legs 09/26/2014  . Unspecified constipation 11/02/2012  . Unspecified hearing loss 02/08/2013  . Unspecified hereditary and idiopathic peripheral neuropathy 01/27/2012  . Urinary frequency 02/24/2014  . Ventricular fibrillation ([place]) 01/24/2012    Past Surgical History:  Procedure Laterality Date  . ABDOMINAL HYSTERECTOMY  1990   for endometrial cancer  . CATARACT EXTRACTION W/ INTRAOCULAR LENS  IMPLANT, BILATERAL    . [place]  . GUM SURGERY  1932  . MASTOIDECTOMY  1920   bilateral  . THYROIDECTOMY  1960  . TONSILLECTOMY  1916    Allergies  Allergen Reactions  . Amoxicillin     Unknown, patient unable to answer  questionnaire   . Aspirin Other (See Comments)    On MAR  . Avelox [Moxifloxacin]     Unknown: listed on MAR  . Erythromycin     Unknown: listed on MAR  . Monistat [Miconazole]     Unknown: listed on MAR  . Morphine And Related     Unknown: listed on MAR  . Orange Juice [Orange Oil]     Unknown: listed on [facility]    Outpatient Encounter Medications as of 02/24/2021  Medication Sig  . aspirin 81 MG chewable tablet Chew 81 mg by mouth  daily.  . calcium citrate-vitamin D (CITRACAL+D) 315-200 MG-UNIT per tablet Take 1 tablet by mouth daily.  . carboxymethylcellulose (REFRESH PLUS) 0.5 % SOLN Place 1 drop into both eyes 2 (two) times daily.  . Eyelid Cleansers (OCUSOFT LID SCRUB ALLERGY) PADS Place 1 each into both eyes in the morning and at bedtime. Cleanse bilateral lids for excess eye secretions.  . fexofenadine (ALLEGRA) 60 MG tablet Take 60 mg by mouth daily.  Marland Kitchen ipratropium-albuterol (DUONEB) 0.5-2.5 (3) MG/3ML SOLN Take 3 mLs by nebulization 2 (two) times daily.   Marland Kitchen levothyroxine (SYNTHROID, LEVOTHROID) 150 MCG tablet Take 150 mcg by mouth daily.   . Melatonin 3 MG TABS Take 3 mg by mouth at bedtime.   . Multiple Vitamins-Minerals (CEROVITE SENIOR PO) Take 1 tablet by mouth daily.  . potassium chloride SA (K-DUR) 20 MEQ tablet Take 20 mEq by mouth daily.  . sennosides-docusate sodium (SENOKOT-S) 8.6-50 MG tablet Take 2 tablets by mouth at bedtime.   . torsemide (DEMADEX) 20 MG tablet Take 40 mg by mouth every other day.   . torsemide (DEMADEX) 20 MG tablet Take 20 mg by mouth every other day.  . zinc oxide 20 % ointment Apply 1 application topically daily as needed for irritation (To buttocks after every incontinent episode and for redness.).   . [DISCONTINUED] docusate sodium (COLACE) 100 MG capsule Take 200 mg by mouth at bedtime.    No facility-administered encounter medications on file as of 02/24/2021.    Review of Systems:  Review of Systems  Very hard of hearing No New issues per nurses. Just sleeps more  Health Maintenance  Topic Date Due  . TETANUS/TDAP  04/06/2025  . INFLUENZA VACCINE  Completed  . COVID-19 Vaccine  Completed  . PNA vac Low Risk Adult  Completed  . HPV VACCINES  Aged Out    Physical Exam: Vitals:   02/24/21 1425  BP: 127/62  Pulse: 84  Resp: 14  Temp: (!) 97.5 F (36.4 C)  SpO2: 94%  Weight: 130 lb 12.8 oz (59.3 kg)  Height: 5' (1.524 m)   Body mass index is 25.55  kg/m. Physical Exam Vitals reviewed.  Constitutional:      Appearance: Normal appearance.  HENT:     Head: Normocephalic.     Nose: Nose normal.     Mouth/Throat:     Mouth: Mucous membranes are moist.     Pharynx: Oropharynx is clear.  Eyes:     Pupils: Pupils are equal, round, and reactive to light.  Cardiovascular:     Rate and Rhythm: Normal rate.     Pulses: Normal pulses.  Pulmonary:     Effort: Pulmonary effort is normal.     Breath sounds: Normal breath sounds.  Abdominal:     General: Abdomen is flat. Bowel sounds are normal.     Palpations: Abdomen is soft.  Musculoskeletal:  General: Swelling present.     Cervical back: Neck supple.  Skin:    General: Skin is warm.  Neurological:     General: No focal deficit present.     Mental Status: She is alert.  Psychiatric:        Mood and Affect: Mood normal.        Thought Content: Thought content normal.     Labs reviewed: Basic Metabolic Panel: Recent Labs    06/06/20 0000 06/25/20 0000 10/17/20 0000  NA 139 141 140  K 3.8 4.1 4.3  CL 99 99 97*  CO2 31* 31* 30*  BUN 36* 34* 33*  CREATININE 1.1 1.2* 1.1  CALCIUM 8.8 9.3 9.4  TSH 0.49 0.44 0.47   Liver Function Tests: Recent Labs    10/17/20 0000 10/20/20 0000 11/14/20 0000  AST 299* 99* 17  ALT 299* 299* 13  ALKPHOS 160* 171* 71  ALBUMIN 3.8 3.8 3.2*   No results for input(s): LIPASE, AMYLASE in the last 8760 hours. No results for input(s): AMMONIA in the last 8760 hours. CBC: Recent Labs    06/06/20 0000 06/25/20 0000 10/17/20 0000  WBC 7.9 10.0 5.0  NEUTROABS 4,511 5,880 2,520.00  HGB 12.5 12.9 14.4  HCT 37 40 44  PLT 184 194 220   Lipid Panel: No results for input(s): CHOL, HDL, LDLCALC, TRIG, CHOLHDL, LDLDIRECT in the last 8760 hours. No results found for: HGBA1C  Procedures since last visit: No results found.  Assessment/Plan 1.  Frequent falls Continue supportive care  2. TIA (transient ischemic attack) On  low-dose of aspirin  3. Primary osteoarthritis involving multiple joints Continue on Tylenol  4. Hypothyroidism, unspecified type Repeat TSH 5. LE Edema Continue on Demadex and potassium Will repeat BMP  Labs/tests ordered: CMP,CBC,TSH

## 2021-02-26 DIAGNOSIS — I10 Essential (primary) hypertension: Secondary | ICD-10-CM | POA: Diagnosis not present

## 2021-02-27 ENCOUNTER — Non-Acute Institutional Stay (SKILLED_NURSING_FACILITY): Payer: Medicare PPO | Admitting: Internal Medicine

## 2021-02-27 ENCOUNTER — Encounter: Payer: Self-pay | Admitting: Internal Medicine

## 2021-02-27 DIAGNOSIS — N1832 Chronic kidney disease, stage 3b: Secondary | ICD-10-CM

## 2021-02-27 DIAGNOSIS — I872 Venous insufficiency (chronic) (peripheral): Secondary | ICD-10-CM | POA: Diagnosis not present

## 2021-02-27 DIAGNOSIS — R600 Localized edema: Secondary | ICD-10-CM

## 2021-02-27 DIAGNOSIS — E039 Hypothyroidism, unspecified: Secondary | ICD-10-CM

## 2021-02-27 DIAGNOSIS — R7989 Other specified abnormal findings of blood chemistry: Secondary | ICD-10-CM | POA: Diagnosis not present

## 2021-02-27 LAB — CBC AND DIFFERENTIAL
HCT: 43 (ref 36–46)
Hemoglobin: 13.8 (ref 12.0–16.0)
Neutrophils Absolute: 7897
Platelets: 174 (ref 150–399)
WBC: 10.6

## 2021-02-27 LAB — HEPATIC FUNCTION PANEL
ALT: 299 — AB (ref 7–35)
AST: 299 — AB (ref 13–35)
Alkaline Phosphatase: 234 — AB (ref 25–125)
Bilirubin, Total: 2

## 2021-02-27 LAB — COMPREHENSIVE METABOLIC PANEL
Albumin: 3.3 — AB (ref 3.5–5.0)
Calcium: 8.7 (ref 8.7–10.7)
Globulin: 2.6

## 2021-02-27 LAB — BASIC METABOLIC PANEL
BUN: 46 — AB (ref 4–21)
CO2: 28 — AB (ref 13–22)
Chloride: 106 (ref 99–108)
Creatinine: 1.2 — AB (ref 0.5–1.1)
Glucose: 106
Potassium: 3.6 (ref 3.4–5.3)
Sodium: 146 (ref 137–147)

## 2021-02-27 LAB — CBC: RBC: 4.87 (ref 3.87–5.11)

## 2021-02-27 NOTE — Progress Notes (Signed)
Location:   [facility] Room Number: 28 Place of Service:  SNF [phone]) Provider:  Veleta Miners MD  [name], MD  Patient Care Team: [name], MD as PCP - General (Internal Medicine) [place], [place], [facility] Mast, Man X, NP as Nurse Practitioner (Nurse Practitioner)  Extended Emergency Contact Information Primary Emergency Contact: [name] [place] of [place] Phone: [phone] Relation: Son Secondary Emergency Contact: [place] Phone: [phone] Mobile Phone: [phone] Relation: Daughter  Code Status:  DNR Managed Care Goals of care: Advanced Directive information Advanced Directives 02/24/2021  Does Patient Have a Medical Advance Directive? Yes  Type of Paramedic of [place];Living will;Out of facility DNR (pink MOST or yellow form)  Does patient want to make changes to medical advance directive? -  Copy of [place] in Chart? Yes - validated most recent copy scanned in chart (See row information)  Would patient like information on creating a medical advance directive? -  Pre-existing out of facility DNR order (yellow form or pink MOST form) Pink MOST form placed in chart (order not valid for inpatient use)     Chief Complaint  Patient presents with  . Acute Visit    Hepatitis    HPI:  Pt is a 87 y.o. female seen today for an acute visit for Elevated LFTS  Patient has h/o Bilateral LE edema , Hypertension, Hard of hearing, h/o Cellulitis, Hypothyroidism, COPD, Chronic Conjunctivitis has h/o Gall stones with Hepatitis   Patient had regular labs and she has Elevated LFTS. Her weight is stable No Nausea Vomiting or Abdominal pain Her old US done in 11/21 was positive for Gall stones  Past Medical History:  Diagnosis Date  . Abnormal liver function tests    11/11/15 AST 31, ALT 88, alk phos 96   . Acute kidney failure,  unspecified ([place]) 01/24/2012  . Arthralgia 09/26/2014   Multiple joints: knees, shoulders, wrists, spine hips   . Arthritis   . Candidiasis of other urogenital sites 08/10/2012  . Cerebral embolism 05/23/2005  . CHF (congestive heart failure) ([place])   . Cholelithiasis 11/04/2015  . Closed fracture of sacrum and coccyx without mention of spinal cord injury 02/14/2012  . Constipation 05/03/2013  . Contusion of face, scalp, and neck except eye(s) 06/01/2012  . Contusion of wrist 06/01/2012  . COPD (chronic obstructive pulmonary disease) ([place])   . COPD, mild ([place]) 01/20/2013  . Edema 04/20/2012  . H/O: CVA (cerebrovascular accident)   . Hearing loss 09/26/2014  . History of cancer of uterus   . HTN (hypertension), benign   . Hyperglycemia 11/14/2015  . Hypothyroidism   . Insomnia, unspecified 08/10/2012  . Open wound of knee, leg (except thigh), and ankle, without mention of complication 5/0/2774  . Other and unspecified hyperlipidemia 06/01/2012  . Other disorder of coccyx 01/27/2012  . Pain in joint, ankle and foot 06/14/2012  . Peripheral vascular disease, unspecified ([place]) 01/27/2012  . Personal history of fall 01/27/2012  . Pneumonia, organism unspecified(486) 01/23/2005  . Rheumatic fever 09/26/1933   Age 25 10/01/14 ESR 13, RAF <10   . Seasonal allergies 05/03/2013  . Stasis dermatitis of both legs 09/26/2014  . Unspecified constipation 11/02/2012  . Unspecified hearing loss 02/08/2013  . Unspecified hereditary and idiopathic peripheral neuropathy 01/27/2012  . Urinary frequency 02/24/2014  . Ventricular fibrillation ([place]) 01/24/2012   Past Surgical History:  Procedure Laterality Date  . ABDOMINAL HYSTERECTOMY  1990  for endometrial cancer  . CATARACT EXTRACTION W/ INTRAOCULAR LENS  IMPLANT, BILATERAL    . [place]  . GUM SURGERY  1932  . MASTOIDECTOMY  1920   bilateral  . THYROIDECTOMY  1960  . TONSILLECTOMY  1916    Allergies  Allergen Reactions  . Amoxicillin      Unknown, patient unable to answer questionnaire   . Aspirin Other (See Comments)    On MAR  . Avelox [Moxifloxacin]     Unknown: listed on MAR  . Erythromycin     Unknown: listed on MAR  . Monistat [Miconazole]     Unknown: listed on MAR  . Morphine And Related     Unknown: listed on MAR  . Orange Juice [Orange Oil]     Unknown: listed on MAR    Allergies as of 02/27/2021      Reactions   Amoxicillin    Unknown, patient unable to answer questionnaire    Aspirin Other (See Comments)   On MAR   Avelox [moxifloxacin]    Unknown: listed on MAR   Erythromycin    Unknown: listed on MAR   Monistat [miconazole]    Unknown: listed on MAR   Morphine And Related    Unknown: listed on MAR   Orange Juice [orange Oil]    Unknown: listed on [facility]      Medication List       Accurate as of February 27, 2021  2:09 PM. If you have any questions, ask your nurse or doctor.        aspirin 81 MG chewable tablet Chew 81 mg by mouth daily.   calcium citrate-vitamin D 315-200 MG-UNIT tablet Commonly known as: CITRACAL+D Take 1 tablet by mouth daily.   carboxymethylcellulose 0.5 % Soln Commonly known as: REFRESH PLUS Place 1 drop into both eyes 2 (two) times daily.   CEROVITE SENIOR PO Take 1 tablet by mouth daily.   fexofenadine 60 MG tablet Commonly known as: ALLEGRA Take 60 mg by mouth daily.   ipratropium-albuterol 0.5-2.5 (3) MG/3ML Soln Commonly known as: DUONEB Take 3 mLs by nebulization 2 (two) times daily.   levothyroxine 150 MCG tablet Commonly known as: SYNTHROID Take 150 mcg by mouth daily.   melatonin 3 MG Tabs tablet Take 3 mg by mouth at bedtime.   OcuSoft Lid Scrub Allergy Pads Place 1 each into both eyes in the morning and at bedtime. Cleanse bilateral lids for excess eye secretions.   potassium chloride SA 20 MEQ tablet Commonly known as: KLOR-CON Take 20 mEq by mouth daily.   sennosides-docusate sodium 8.6-50 MG tablet Commonly known as:  SENOKOT-S Take 2 tablets by mouth at bedtime.   torsemide 20 MG tablet Commonly known as: DEMADEX Take 40 mg by mouth every other day.   torsemide 20 MG tablet Commonly known as: DEMADEX Take 20 mg by mouth every other day.   zinc oxide 20 % ointment Apply 1 application topically daily as needed for irritation (To buttocks after every incontinent episode and for redness.).       Review of Systems  Hard to get history as she is very Hard of hearing  Immunization History  Administered Date(s) Administered  . Influenza Whole 10/19/2012, 09/15/2018  . Influenza, High Dose Seasonal PF 09/14/2019  . Influenza-Unspecified 10/18/2013, 10/17/2014, 09/02/2015, 09/29/2016, 10/03/2017, 09/23/2020  . Moderna Sars-Covid-2 Vaccination 12/15/2019, 01/12/2020, 10/21/2020  . PPD Test 01/22/2013, 11/28/2015  . Pneumococcal Conjugate-13 11/18/2017  . Pneumococcal Polysaccharide-23 01/22/2013  .  Td 02/21/2009  . Tdap 04/07/2015   Pertinent  Health Maintenance Due  Topic Date Due  . INFLUENZA VACCINE  Completed  . PNA vac Low Risk Adult  Completed   Fall Risk  09/05/2018 09/02/2017 11/14/2015 04/17/2015 09/26/2014  Falls in the past year? Yes No No Yes No  Number falls in past yr: 2 or more - - 1 -  Injury with Fall? Yes - - Yes -  Comment - - - laceration forehead 04/07/15 -  Risk Factor Category  - - - High Fall Risk -  Risk for fall due to : - - Impaired mobility;Impaired vision - -   Functional Status Survey:    Vitals:   02/27/21 1400  BP: 127/62  Pulse: 84  Resp: 14  Temp: 98.4 F (36.9 C)  SpO2: 90%  Weight: 130 lb 12.8 oz (59.3 kg)  Height: 5' (1.524 m)   Body mass index is 25.55 kg/m. Physical Exam  Constitutional: . Well-developed and well-nourished.  HENT:  Head: Normocephalic.  Mouth/Throat: Oropharynx is clear and moist.  Eyes: Pupils are equal, round, and reactive to light.  Neck: Neck supple.  Cardiovascular: Normal rate and normal heart sounds.  No murmur  heard. Pulmonary/Chest: Effort normal and breath sounds normal. No respiratory distress. No wheezes. She has no rales.  Abdominal: Soft. Bowel sounds are normal. No distension. There is no tenderness. There is no rebound.  Musculoskeletal: Mild edema.  Lymphadenopathy: none Neurological:No Deficits Skin: Skin is warm and dry.  Psychiatric: Normal mood and affect. Behavior is normal. Thought content normal.    Labs reviewed: Recent Labs    06/25/20 0000 10/17/20 0000 02/27/21 0000  NA 141 140 146  K 4.1 4.3 3.6  CL 99 97* 106  CO2 31* 30* 28*  BUN 34* 33* 46*  CREATININE 1.2* 1.1 1.2*  CALCIUM 9.3 9.4 8.7   Recent Labs    10/20/20 0000 11/14/20 0000 02/27/21 0000  AST 99* 17 299*  ALT 299* 13 299*  ALKPHOS 171* 71 234*  ALBUMIN 3.8 3.2* 3.3*   Recent Labs    06/25/20 0000 10/17/20 0000 02/27/21 0000  WBC 10.0 5.0 10.6  NEUTROABS 5,880 2,520.00 7,897.00  HGB 12.9 14.4 13.8  HCT 40 44 43  PLT 194 220 174   Lab Results  Component Value Date   TSH 0.47 10/17/2020   No results found for: HGBA1C No results found for: CHOL, HDL, LDLCALC, LDLDIRECT, TRIG, CHOLHDL  Significant Diagnostic Results in last 30 days:  No results found.  Assessment/Plan Elevated LFTs Total Billirubin is 2 AST 340 ALT 609 Alk phos 234 Previous US has showed Gall stones  Patient asymptomatic. Though staying more sleepy. Still eating She is comfort care If Worsening Clinically will have her Hospice cosult  Low TSH level Change Syntyroid to 137.5 mcg Repeat TSH in 8 weeks Stage 3b chronic kidney disease (HCC) Mild Worsening Edema of both lower legs due to peripheral venous insufficiency Continue Demadex for now     Family/ staff Communication:   Labs/tests ordered:

## 2021-03-25 ENCOUNTER — Encounter: Payer: Self-pay | Admitting: Nurse Practitioner

## 2021-03-25 ENCOUNTER — Non-Acute Institutional Stay (SKILLED_NURSING_FACILITY): Payer: Medicare PPO | Admitting: Nurse Practitioner

## 2021-03-25 DIAGNOSIS — J449 Chronic obstructive pulmonary disease, unspecified: Secondary | ICD-10-CM | POA: Diagnosis not present

## 2021-03-25 DIAGNOSIS — R748 Abnormal levels of other serum enzymes: Secondary | ICD-10-CM | POA: Diagnosis not present

## 2021-03-25 DIAGNOSIS — I872 Venous insufficiency (chronic) (peripheral): Secondary | ICD-10-CM

## 2021-03-25 DIAGNOSIS — R2681 Unsteadiness on feet: Secondary | ICD-10-CM | POA: Diagnosis not present

## 2021-03-25 DIAGNOSIS — R413 Other amnesia: Secondary | ICD-10-CM | POA: Diagnosis not present

## 2021-03-25 DIAGNOSIS — R296 Repeated falls: Secondary | ICD-10-CM

## 2021-03-25 DIAGNOSIS — G459 Transient cerebral ischemic attack, unspecified: Secondary | ICD-10-CM

## 2021-03-25 DIAGNOSIS — N1832 Chronic kidney disease, stage 3b: Secondary | ICD-10-CM | POA: Diagnosis not present

## 2021-03-25 DIAGNOSIS — R600 Localized edema: Secondary | ICD-10-CM

## 2021-03-25 DIAGNOSIS — R627 Adult failure to thrive: Secondary | ICD-10-CM

## 2021-03-25 DIAGNOSIS — M8949 Other hypertrophic osteoarthropathy, multiple sites: Secondary | ICD-10-CM | POA: Diagnosis not present

## 2021-03-25 DIAGNOSIS — M159 Polyosteoarthritis, unspecified: Secondary | ICD-10-CM

## 2021-03-25 NOTE — Progress Notes (Signed)
Location:   [facility] Room Number: 28-A Place of Service:  SNF (31) Provider:  , , NP    Patient Care Team: Tracie Dad, MD as PCP - General (Internal Medicine) [place], [place], [facility] ,  X, NP as Nurse Practitioner (Nurse Practitioner)  Extended Emergency Contact Information Primary Emergency Contact: [name] States of [place] Phone: [phone] Relation: Son Secondary Emergency Contact: [place] Phone: [phone] Mobile Phone: [phone] Relation: Daughter  Code Status:  DNR Goals of care: Advanced Directive information Advanced Directives 03/25/2021  Does Patient Have a Medical Advance Directive? Yes  Type of Paramedic of [place];Living will;Out of facility DNR (pink MOST or yellow form)  Does patient want to make changes to medical advance directive? No - Patient declined  Copy of [place] in Chart? Yes - validated most recent copy scanned in chart (See row information)  Would patient like information on creating a medical advance directive? -  Pre-existing out of facility DNR order (yellow form or pink MOST form) -     Chief Complaint  Patient presents with  . Medical agement of Chronic Issues    Routine Visit.     HPI:  Pt is a 87 y.o. female seen today for medical management of chronic diseases.    Frequent falls, frailty, needs assistance/supervision for transfer/ambulation, doesn't call for assistance consistently.              Gait abnormality, wobbly gait with walker, needs assistance with transfer.  Memory deficit, needs remainders, verbal cues frequently.  Hypothyroidism, takes Levothyroxine 137.14mg qd since 02/27/21, TSH 0.47 10/17/21, pending f/u 8 weeks Constipation, takes Colace 2050mqd, MOM prn, Senokot S II qhs COPD, takes Allegra 6049md,  DuoNebbid Edema BLE, chronic, takes Torsemide 16m4md, 40mg12m. Bun/creat 33/1.1 10/17/20 CKD, Bun/creat 46/1.2 4 02/27/21 OA pain, R knee, X-ray negative fx 05/16/20, prn Tylenol. TIA ?, Hx of CAD, takes ASA 81mg 7mElevated LFT off Tylenol, RUQ US,10/21/20 US [place] gallstones in the gallbladder, largest 0.8cm. repeated AST299(was 299), ALT299(was 299),total bilirubin 2.0(was 1.7), Alk phos234(was 160)02/27/21    Past Medical History:  Diagnosis Date  . Abnormal liver function tests    11/11/15 AST 31, ALT 88, alk phos 96   . Acute kidney failure, unspecified (HCC) [place]/2013  . Arthralgia 09/26/2014   Multiple joints: knees, shoulders, wrists, spine hips   . Arthritis   . Candidiasis of other urogenital sites 08/10/2012  . Cerebral embolism 05/23/2005  . CHF (congestive heart failure) (HCC)  [place]olelithiasis 11/04/2015  . Closed fracture of sacrum and coccyx without mention of spinal cord injury 02/14/2012  . Constipation 05/03/2013  . Contusion of face, scalp, and neck except eye(s) 06/01/2012  . Contusion of wrist 06/01/2012  . COPD (chronic obstructive pulmonary disease) (HCC)  [place], mild (HCC) 2[place]2014  . Edema 04/20/2012  . H/O: CVA (cerebrovascular accident)   . Hearing loss 09/26/2014  . History of cancer of uterus   . HTN (hypertension), benign   . Hyperglycemia 11/14/2015  . Hypothyroidism   . Insomnia, unspecified 08/10/2012  . Open wound of knee, leg (except thigh), and ankle, without mention of complication 5/9/206/9/4854her and unspecified hyperlipidemia 06/01/2012  . Other disorder of coccyx 01/27/2012  . Pain in joint, ankle and foot 06/14/2012  . Peripheral vascular disease, unspecified (HCC) 2[place]/2013  . Personal history of fall 01/27/2012  . Pneumonia,  organism unspecified(486) 01/23/2005  . Rheumatic fever 09/26/1933   Age 7 10/01/14 ESR 13, RAF <10   . Seasonal allergies 05/03/2013  . Stasis  dermatitis of both legs 09/26/2014  . Unspecified constipation 11/02/2012  . Unspecified hearing loss 02/08/2013  . Unspecified hereditary and idiopathic peripheral neuropathy 01/27/2012  . Urinary frequency 02/24/2014  . Ventricular fibrillation ([place]) 01/24/2012   Past Surgical History:  Procedure Laterality Date  . ABDOMINAL HYSTERECTOMY  1990   for endometrial cancer  . CATARACT EXTRACTION W/ INTRAOCULAR LENS  IMPLANT, BILATERAL    . [place]  . GUM SURGERY  1932  . MASTOIDECTOMY  1920   bilateral  . THYROIDECTOMY  1960  . TONSILLECTOMY  1916    Allergies  Allergen Reactions  . Amoxicillin     Unknown, patient unable to answer questionnaire   . Aspirin Other (See Comments)    On MAR  . Avelox [Moxifloxacin]     Unknown: listed on MAR  . Erythromycin     Unknown: listed on MAR  . Monistat [Miconazole]     Unknown: listed on MAR  . Morphine And Related     Unknown: listed on MAR  . Orange Juice [Orange Oil]     Unknown: listed on MAR    Allergies as of 03/25/2021      Reactions   Amoxicillin    Unknown, patient unable to answer questionnaire    Aspirin Other (See Comments)   On MAR   Avelox [moxifloxacin]    Unknown: listed on MAR   Erythromycin    Unknown: listed on MAR   Monistat [miconazole]    Unknown: listed on MAR   Morphine And Related    Unknown: listed on MAR   Orange Juice [orange Oil]    Unknown: listed on [facility]      Medication List       Accurate as of March 25, 2021 11:59 PM. If you have any questions, ask your nurse or doctor.        aspirin 81 MG chewable tablet Chew 81 mg by mouth daily.   calcium citrate-vitamin D 315-200 MG-UNIT tablet Commonly known as: CITRACAL+D Take 1 tablet by mouth daily.   carboxymethylcellulose 0.5 % Soln Commonly known as: REFRESH PLUS Place 1 drop into both eyes 2 (two) times daily.   CEROVITE SENIOR PO Take 1 tablet by mouth daily.   fexofenadine 60 MG tablet Commonly known as:  ALLEGRA Take 60 mg by mouth daily.   ipratropium-albuterol 0.5-2.5 (3) MG/3ML Soln Commonly known as: DUONEB Take 3 mLs by nebulization 2 (two) times daily.   levothyroxine 137 MCG tablet Commonly known as: SYNTHROID Take 137 mcg by mouth daily before breakfast. What changed: Another medication with the same name was removed. Continue taking this medication, and follow the directions you see here. Changed by:  X , NP   melatonin 3 MG Tabs tablet Take 3 mg by mouth at bedtime.   OcuSoft Lid Scrub Allergy Pads Place 1 each into both eyes in the morning and at bedtime. Cleanse bilateral lids for excess eye secretions.   potassium chloride SA 20 MEQ tablet Commonly known as: KLOR-CON Take 20 mEq by mouth daily.   sennosides-docusate sodium 8.6-50 MG tablet Commonly known as: SENOKOT-S Take 2 tablets by mouth at bedtime.   torsemide 20 MG tablet Commonly known as: DEMADEX Take 40 mg by mouth every other day.   torsemide 20 MG tablet Commonly known as: DEMADEX Take 20  mg by mouth every other day.   zinc oxide 20 % ointment Apply 1 application topically daily as needed for irritation (To buttocks after every incontinent episode and for redness.).       Review of Systems  Constitutional: Positive for unexpected weight change. Negative for fatigue and fever.       Weight loss about #7 Ibs in the past month.   HENT: Positive for hearing loss. Negative for congestion and voice change.   Eyes: Negative for visual disturbance.  Respiratory: Positive for shortness of breath. Negative for cough.        DOE  Cardiovascular: Positive for leg swelling.  Gastrointestinal: Negative for abdominal pain and constipation.  Genitourinary: Positive for frequency. Negative for dysuria and urgency.       Denied dysuria today.   Musculoskeletal: Positive for arthralgias and gait problem.  Skin: Negative for color change.  Neurological: Negative for speech difficulty, weakness,  light-headedness and headaches.       Memory lapses.   Psychiatric/Behavioral: Negative for behavioral problems and sleep disturbance. The patient is not nervous/anxious.     Immunization History  Administered Date(s) Administered  . Influenza Whole 10/19/2012, 09/15/2018  . Influenza, High Dose Seasonal PF 09/14/2019  . Influenza-Unspecified 10/18/2013, 10/17/2014, 09/02/2015, 09/29/2016, 10/03/2017, 09/23/2020  . Moderna Sars-Covid-2 Vaccination 12/15/2019, 01/12/2020, 10/21/2020  . PPD Test 01/22/2013, 11/28/2015  . Pneumococcal Conjugate-13 11/18/2017  . Pneumococcal Polysaccharide-23 01/22/2013  . Td 02/21/2009  . Tdap 04/07/2015   Pertinent  Health Maintenance Due  Topic Date Due  . INFLUENZA VACCINE  07/13/2021  . PNA vac Low Risk Adult  Completed   Fall Risk  09/05/2018 09/02/2017 11/14/2015 04/17/2015 09/26/2014  Falls in the past year? Yes No No Yes No  Number falls in past yr: 2 or more - - 1 -  Injury with Fall? Yes - - Yes -  Comment - - - laceration forehead 04/07/15 -  Risk Factor Category  - - - High Fall Risk -  Risk for fall due to : - - Impaired mobility;Impaired vision - -   Functional Status Survey:    Vitals:   03/25/21 1537  BP: 118/90  Pulse: 78  Resp: 16  Temp: 98.1 F (36.7 C)  SpO2: 94%  Weight: 123 lb 8 oz (56 kg)  Height: 5' (1.524 m)   Body mass index is 24.12 kg/m. Physical Exam Vitals and nursing note reviewed.  Constitutional:      Appearance: Normal appearance.  HENT:     Head: Normocephalic and atraumatic.     Mouth/Throat:     Mouth: Mucous membranes are moist.  Eyes:     Extraocular Movements: Extraocular movements intact.     Pupils: Pupils are equal, round, and reactive to light.  Cardiovascular:     Rate and Rhythm: Normal rate and regular rhythm.     Heart sounds: Murmur heard.    Pulmonary:     Breath sounds: Rales present.     Comments: Bibasilar rales chronic Abdominal:     General: Bowel sounds are normal. There  is no distension.     Palpations: Abdomen is soft.     Tenderness: There is no abdominal tenderness. There is no right CVA tenderness, left CVA tenderness, guarding or rebound.  Musculoskeletal:     Cervical back: Normal range of motion and neck supple.     Right lower leg: Edema present.     Left lower leg: Edema present.     Comments: Trace edema  BLE  Skin:    General: Skin is warm and dry.     Comments: Scalp hematoma about a marble sized left occiput.   Neurological:     General: No focal deficit present.     Mental Status: She is alert. Mental status is at baseline.     Gait: Gait abnormal.     Comments: Oriented to person, place.   Psychiatric:        Mood and Affect: Mood normal.        Behavior: Behavior normal.     Labs reviewed: Recent Labs    06/25/20 0000 10/17/20 0000 02/27/21 0000  NA 141 140 146  K 4.1 4.3 3.6  CL 99 97* 106  CO2 31* 30* 28*  BUN 34* 33* 46*  CREATININE 1.2* 1.1 1.2*  CALCIUM 9.3 9.4 8.7   Recent Labs    10/20/20 0000 11/14/20 0000 02/27/21 0000  AST 99* 17 299*  ALT 299* 13 299*  ALKPHOS 171* 71 234*  ALBUMIN 3.8 3.2* 3.3*   Recent Labs    06/25/20 0000 10/17/20 0000 02/27/21 0000  WBC 10.0 5.0 10.6  NEUTROABS 5,880 2,520.00 7,897.00  HGB 12.9 14.4 13.8  HCT 40 44 43  PLT 194 220 174   Lab Results  Component Value Date   TSH 0.47 10/17/2020   No results found for: HGBA1C No results found for: CHOL, HDL, LDLCALC, LDLDIRECT, TRIG, CHOLHDL  Significant Diagnostic Results in last 30 days:  No results found.  Assessment/Plan COPD, mild (HCC) takes Allegra 21m qd, DuoNebbid   Edema of both lower legs due to peripheral venous insufficiency chronic, takes Torsemide 262mqod, 4057mod. Bun/creat 33/1.1 10/17/20  Chronic kidney disease (CKD), stage III (moderate) Bun/creat 46/1.2 4 02/27/21   Osteoarthritis, multiple sites pain, R knee, X-ray negative fx 05/16/20, prn Tylenol.   TIA (transient ischemic  attack) TIA ?, Hx of CAD, takes ASA 40m35m.  Elevated liver enzymes May consider Hospice for comfort care if the patient became symptomatic.  Elevated LFT off Tylenol, RUQ US,10/21/20 US [place] multiple gallstones in the gallbladder, largest 0.8cm. repeated AST299(was 299), ALT299(was 299),total bilirubin 2.0(was 1.7), Alk phos234(was 160)02/27/21    Memory deficits Memory deficit, needs remainders, verbal cues frequently.  Gait instability Gait abnormality, wobbly gait with walker, needs assistance with transfer.    Frequent falls Frequent falls, frailty, needs assistance/supervision for transfer/ambulation, doesn't call for assistance consistently.   Adult failure to thrive Weight loss about #7Ibs in the past months, continue supportive care, may consider Hospice service.      Family/ staff Communication: plan of care reviewed with the patient and charge nurse.   Labs/tests ordered:  none  Time spend 35 minutes.

## 2021-03-26 ENCOUNTER — Encounter: Payer: Self-pay | Admitting: Nurse Practitioner

## 2021-03-26 DIAGNOSIS — R627 Adult failure to thrive: Secondary | ICD-10-CM | POA: Insufficient documentation

## 2021-03-26 DIAGNOSIS — R748 Abnormal levels of other serum enzymes: Secondary | ICD-10-CM | POA: Insufficient documentation

## 2021-03-26 NOTE — Assessment & Plan Note (Signed)
Weight loss about #7Ibs in the past months, continue supportive care, may consider Hospice service.

## 2021-03-26 NOTE — Assessment & Plan Note (Signed)
pain, R knee, X-ray negative fx 05/16/20, prn Tylenol.

## 2021-03-26 NOTE — Assessment & Plan Note (Signed)
Bun/creat 46/1.2 4 02/27/21

## 2021-03-26 NOTE — Assessment & Plan Note (Signed)
May consider Hospice for comfort care if the patient became symptomatic.  Elevated LFT off Tylenol, RUQ US,10/21/20 [place] abd multiple gallstones in the gallbladder, largest 0.8cm. repeated AST299(was 299), ALT299(was 299),total bilirubin 2.0(was 1.7), Alk phos234(was 160)02/27/21

## 2021-03-26 NOTE — Assessment & Plan Note (Signed)
chronic, takes Torsemide 20mg  qod, 40mg  qod. Bun/creat 33/1.1 10/17/20

## 2021-03-26 NOTE — Assessment & Plan Note (Signed)
takes Allegra 60mg  qd, DuoNebbid

## 2021-03-26 NOTE — Assessment & Plan Note (Signed)
TIA ?, Hx of CAD, takes ASA 81mg  qd.

## 2021-03-26 NOTE — Assessment & Plan Note (Signed)
Gait abnormality, wobbly gait with walker, needs assistance with transfer.

## 2021-03-26 NOTE — Assessment & Plan Note (Signed)
Frequent falls, frailty, needs assistance/supervision for transfer/ambulation, doesn't call for assistance consistently.

## 2021-03-26 NOTE — Assessment & Plan Note (Signed)
Memory deficit, needs remainders, verbal cues frequently.

## 2021-03-30 ENCOUNTER — Encounter: Payer: Self-pay | Admitting: Nurse Practitioner

## 2021-04-20 ENCOUNTER — Encounter: Payer: Self-pay | Admitting: Nurse Practitioner

## 2021-04-20 ENCOUNTER — Non-Acute Institutional Stay (SKILLED_NURSING_FACILITY): Payer: Medicare Other | Admitting: Nurse Practitioner

## 2021-04-20 DIAGNOSIS — G459 Transient cerebral ischemic attack, unspecified: Secondary | ICD-10-CM

## 2021-04-20 DIAGNOSIS — J449 Chronic obstructive pulmonary disease, unspecified: Secondary | ICD-10-CM

## 2021-04-20 DIAGNOSIS — R627 Adult failure to thrive: Secondary | ICD-10-CM | POA: Diagnosis not present

## 2021-04-20 DIAGNOSIS — M159 Polyosteoarthritis, unspecified: Secondary | ICD-10-CM

## 2021-04-20 DIAGNOSIS — N1832 Chronic kidney disease, stage 3b: Secondary | ICD-10-CM

## 2021-04-20 DIAGNOSIS — R413 Other amnesia: Secondary | ICD-10-CM | POA: Diagnosis not present

## 2021-04-20 DIAGNOSIS — I872 Venous insufficiency (chronic) (peripheral): Secondary | ICD-10-CM

## 2021-04-20 DIAGNOSIS — R296 Repeated falls: Secondary | ICD-10-CM

## 2021-04-20 DIAGNOSIS — E890 Postprocedural hypothyroidism: Secondary | ICD-10-CM

## 2021-04-20 DIAGNOSIS — K5900 Constipation, unspecified: Secondary | ICD-10-CM

## 2021-04-20 DIAGNOSIS — R600 Localized edema: Secondary | ICD-10-CM

## 2021-04-20 DIAGNOSIS — M8949 Other hypertrophic osteoarthropathy, multiple sites: Secondary | ICD-10-CM

## 2021-04-20 DIAGNOSIS — R2681 Unsteadiness on feet: Secondary | ICD-10-CM

## 2021-04-20 DIAGNOSIS — R748 Abnormal levels of other serum enzymes: Secondary | ICD-10-CM

## 2021-04-20 NOTE — Assessment & Plan Note (Signed)
OA pain, R knee, X-ray negative fx 05/16/20, prn Tylenol.  

## 2021-04-20 NOTE — Assessment & Plan Note (Signed)
gradual weight loss, comfort measures, under Hospice service.

## 2021-04-20 NOTE — Assessment & Plan Note (Signed)
needs remainders, verbal cues frequently.

## 2021-04-20 NOTE — Assessment & Plan Note (Signed)
TIA ?, Hx of CAD, takes ASA 81mg  qd.

## 2021-04-20 NOTE — Assessment & Plan Note (Signed)
Bun/creat 46/1.2 4 02/27/21 

## 2021-04-20 NOTE — Assessment & Plan Note (Signed)
frailty, needs assistance/supervision for transfer/ambulation, doesn't call for assistance consistently.

## 2021-04-20 NOTE — Assessment & Plan Note (Signed)
w/c for mobility, needs assistance with transfer. °

## 2021-04-20 NOTE — Assessment & Plan Note (Signed)
takes Colace 200mg qd, MOM prn, Senokot S II qhs 

## 2021-04-20 NOTE — Assessment & Plan Note (Signed)
chronic, takes Torsemide 20mg  qod, 40mg  qod. Bun/creat 46/1.24 3/18/

## 2021-04-20 NOTE — Progress Notes (Signed)
Location:   [facility] Room Number: 28 Place of Service:  SNF (31) Provider: Xie  NP  Gupta, Anjali L, MD  Patient Care Team: Gupta, Anjali L, MD as PCP - General (Internal Medicine) [place]  X, NP as Nurse Practitioner (Nurse Practitioner)  Extended Emergency Contact Information Primary Emergency Contact: Linsey,Samuel  [place] Home Phone: [phone] Relation: Son Secondary Emergency Contact: Graf-Dwight,Jean Home Phone: [phone] Mobile Phone: [phone] Relation: Daughter  Code Status:  DNR Goals of care: Advanced Directive information Advanced Directives 03/25/2021  Does Patient Have a Medical Advance Directive? Yes  Type of Advance Directive Healthcare Power of Attorney;Living will;Out of facility DNR (pink MOST or yellow form)  Does patient want to make changes to medical advance directive? No - Patient declined  Copy of Healthcare Power of Attorney in Chart? Yes - validated most recent copy scanned in chart (See row information)  Would patient like information on creating a medical advance directive? -  Pre-existing out of facility DNR order (yellow form or pink MOST form) -     Chief Complaint  Patient presents with  . Medical agement of Chronic Issues    HPI:  Pt is a 87 y.o. female seen today for medical management of chronic diseases.    AFT, gradual weight loss, comfort measures, under Hospice service.   Frequent falls, frailty, needs assistance/supervision for transfer/ambulation, doesn't call for assistance consistently.  Gait abnormality, w/c for mobility, needs assistance with transfer.  Memory deficit, needs remainders, verbal cues frequently.  Hypothyroidism, takes Levothyroxine 137.5mcg qd since 02/27/21, TSH 0.47 10/17/21, pending f/u 8 weeks Constipation, takes Colace 200mg qd, MOM prn, Senokot S II  qhs COPD, takes Allegra 60mg qd, DuoNebbid Edema BLE, chronic, takes Torsemide 20mg qod, 40mg qod. Bun/creat 46/1.24 02/27/21  CKD, Bun/creat 46/1.2 4 02/27/21 OA pain, R knee, X-ray negative fx 05/16/20, prn Tylenol. TIA ?, Hx of CAD, takes ASA 81mg qd. Elevated LFT off Tylenol, RUQ US,10/21/20 US abd multiple gallstones in the gallbladder, largest 0.8cm. repeated AST299(was 299), ALT299(was 299),total bilirubin 2.0(was 1.7), Alk phos234(was 160)02/27/21   Past Medical History:  Diagnosis Date  . Abnormal liver function tests    11/11/15 AST 31, ALT 88, alk phos 96   . Acute kidney failure, unspecified (HCC) 01/24/2012  . Arthralgia 09/26/2014   Multiple joints: knees, shoulders, wrists, spine hips   . Arthritis   . Candidiasis of other urogenital sites 08/10/2012  . Cerebral embolism 05/23/2005  . CHF (congestive heart failure) (HCC)   . Cholelithiasis 11/04/2015  . Closed fracture of sacrum and coccyx without mention of spinal cord injury 02/14/2012  . Constipation 05/03/2013  . Contusion of face, scalp, and neck except eye(s) 06/01/2012  . Contusion of wrist 06/01/2012  . COPD (chronic obstructive pulmonary disease) (HCC)   . COPD, mild (HCC) 01/20/2013  . Edema 04/20/2012  . H/O: CVA (cerebrovascular accident)   . Hearing loss 09/26/2014  . History of cancer of uterus   . HTN (hypertension), benign   . Hyperglycemia 11/14/2015  . Hypothyroidism   . Insomnia, unspecified 08/10/2012  . Open wound of knee, leg (except thigh), and ankle, without mention of complication 04/20/2012  . Other and unspecified hyperlipidemia 06/01/2012  . Other disorder of coccyx 01/27/2012  . Pain in joint, ankle and foot 06/14/2012  . Peripheral vascular disease, unspecified (HCC) 01/27/2012  . Personal history of fall 01/27/2012  . Pneumonia, organism unspecified(486) 01/23/2005  . Rheumatic fever 09/26/1933     Age 22 10/01/14 ESR 13, RAF <10   .  Seasonal allergies 05/03/2013  . Stasis dermatitis of both legs 09/26/2014  . Unspecified constipation 11/02/2012  . Unspecified hearing loss 02/08/2013  . Unspecified hereditary and idiopathic peripheral neuropathy 01/27/2012  . Urinary frequency 02/24/2014  . Ventricular fibrillation (HCC) 01/24/2012   Past Surgical History:  Procedure Laterality Date  . ABDOMINAL HYSTERECTOMY  1990   for endometrial cancer  . CATARACT EXTRACTION W/ INTRAOCULAR LENS  IMPLANT, BILATERAL    . ECTOPIC PREGNANCY SURGERY  1952  . GUM SURGERY  1932  . MASTOIDECTOMY  1920   bilateral  . THYROIDECTOMY  1960  . TONSILLECTOMY  1916    Allergies  Allergen Reactions  . Amoxicillin     Unknown, patient unable to answer questionnaire   . Aspirin Other (See Comments)    On MAR  . Avelox [Moxifloxacin]     Unknown: listed on MAR  . Erythromycin     Unknown: listed on MAR  . Monistat [Miconazole]     Unknown: listed on MAR  . Morphine And Related     Unknown: listed on MAR  . Orange Juice [Orange Oil]     Unknown: listed on MAR    Allergies as of 04/20/2021      Reactions   Amoxicillin    Unknown, patient unable to answer questionnaire    Aspirin Other (See Comments)   On MAR   Avelox [moxifloxacin]    Unknown: listed on MAR   Erythromycin    Unknown: listed on MAR   Monistat [miconazole]    Unknown: listed on MAR   Morphine And Related    Unknown: listed on MAR   Orange Juice [orange Oil]    Unknown: listed on MAR      Medication List       Accurate as of Apr 20, 2021 11:59 PM. If you have any questions, ask your nurse or doctor.        aspirin 81 MG chewable tablet Chew 81 mg by mouth daily.   calcium citrate-vitamin D 315-200 MG-UNIT tablet Commonly known as: CITRACAL+D Take 1 tablet by mouth daily.   carboxymethylcellulose 0.5 % Soln Commonly known as: REFRESH PLUS Place 1 drop into both eyes 2 (two) times daily.   CEROVITE SENIOR PO Take 1 tablet by mouth daily.    fexofenadine 60 MG tablet Commonly known as: ALLEGRA Take 60 mg by mouth daily.   ipratropium-albuterol 0.5-2.5 (3) MG/3ML Soln Commonly known as: DUONEB Take 3 mLs by nebulization 2 (two) times daily.   levothyroxine 137 MCG tablet Commonly known as: SYNTHROID Take 137 mcg by mouth daily before breakfast.   melatonin 3 MG Tabs tablet Take 3 mg by mouth at bedtime.   OcuSoft Lid Scrub Allergy Pads Place 1 each into both eyes in the morning and at bedtime. Cleanse bilateral lids for excess eye secretions.   potassium chloride SA 20 MEQ tablet Commonly known as: KLOR-CON Take 20 mEq by mouth daily.   sennosides-docusate sodium 8.6-50 MG tablet Commonly known as: SENOKOT-S Take 2 tablets by mouth at bedtime.   torsemide 20 MG tablet Commonly known as: DEMADEX Take 40 mg by mouth every other day.   torsemide 20 MG tablet Commonly known as: DEMADEX Take 20 mg by mouth every other day.   zinc oxide 20 % ointment Apply 1 application topically daily as needed for irritation (To buttocks after every incontinent episode and for redness.).         Review of Systems  Constitutional: Positive for unexpected weight change. Negative for fatigue and fever.       Weight loss gradually, #2Ibs in the past month.   HENT: Positive for hearing loss. Negative for congestion and voice change.   Eyes: Negative for visual disturbance.  Respiratory: Positive for shortness of breath. Negative for cough.        DOE  Cardiovascular: Positive for leg swelling.  Gastrointestinal: Negative for abdominal pain and constipation.  Genitourinary: Positive for frequency. Negative for dysuria and urgency.       Denied dysuria today.   Musculoskeletal: Positive for arthralgias and gait problem.  Skin: Negative for color change.  Neurological: Negative for speech difficulty, weakness, light-headedness and headaches.       Memory lapses.   Psychiatric/Behavioral: Negative for behavioral problems and  sleep disturbance. The patient is not nervous/anxious.     Immunization History  Administered Date(s) Administered  . Influenza Whole 10/19/2012, 09/15/2018  . Influenza, High Dose Seasonal PF 09/14/2019  . Influenza-Unspecified 10/18/2013, 10/17/2014, 09/02/2015, 09/29/2016, 10/03/2017, 09/23/2020  . Moderna Sars-Covid-2 Vaccination 12/15/2019, 01/12/2020, 10/21/2020  . PPD Test 01/22/2013, 11/28/2015  . Pneumococcal Conjugate-13 11/18/2017  . Pneumococcal Polysaccharide-23 01/22/2013  . Td 02/21/2009  . Tdap 04/07/2015   Pertinent  Health Maintenance Due  Topic Date Due  . INFLUENZA VACCINE  07/13/2021  . PNA vac Low Risk Adult  Completed   Fall Risk  09/05/2018 09/02/2017 11/14/2015 04/17/2015 09/26/2014  Falls in the past year? Yes No No Yes No  Number falls in past yr: 2 or more - - 1 -  Injury with Fall? Yes - - Yes -  Comment - - - laceration forehead 04/07/15 -  Risk Factor Category  - - - High Fall Risk -  Risk for fall due to : - - Impaired mobility;Impaired vision - -   Functional Status Survey:    Vitals:   04/20/21 1552  BP: 134/66  Pulse: 80  Resp: 18  Temp: (!) 97.1 F (36.2 C)  SpO2: 94%  Weight: 120 lb (54.4 kg)   Body mass index is 23.44 kg/m. Physical Exam Vitals and nursing note reviewed.  Constitutional:      Appearance: Normal appearance.  HENT:     Head: Normocephalic and atraumatic.     Mouth/Throat:     Mouth: Mucous membranes are moist.  Eyes:     Extraocular Movements: Extraocular movements intact.     Pupils: Pupils are equal, round, and reactive to light.  Cardiovascular:     Rate and Rhythm: Normal rate and regular rhythm.     Heart sounds: Murmur heard.    Pulmonary:     Breath sounds: Rales present.     Comments: Bibasilar rales chronic Abdominal:     General: Bowel sounds are normal. There is no distension.     Palpations: Abdomen is soft.     Tenderness: There is no abdominal tenderness. There is no right CVA tenderness,  left CVA tenderness, guarding or rebound.  Musculoskeletal:     Cervical back: Normal range of motion and neck supple.     Right lower leg: Edema present.     Left lower leg: Edema present.     Comments: Trace edema BLE  Skin:    General: Skin is warm and dry.     Comments: Scalp hematoma about a marble sized left occiput.   Neurological:     General: No focal deficit present.     Mental Status: She  is alert. Mental status is at baseline.     Gait: Gait abnormal.     Comments: Oriented to person, place.   Psychiatric:        Mood and Affect: Mood normal.        Behavior: Behavior normal.     Labs reviewed: Recent Labs    06/25/20 0000 10/17/20 0000 02/27/21 0000  NA 141 140 146  K 4.1 4.3 3.6  CL 99 97* 106  CO2 31* 30* 28*  BUN 34* 33* 46*  CREATININE 1.2* 1.1 1.2*  CALCIUM 9.3 9.4 8.7   Recent Labs    10/20/20 0000 11/14/20 0000 02/27/21 0000  AST 99* 17 299*  ALT 299* 13 299*  ALKPHOS 171* 71 234*  ALBUMIN 3.8 3.2* 3.3*   Recent Labs    06/25/20 0000 10/17/20 0000 02/27/21 0000  WBC 10.0 5.0 10.6  NEUTROABS 5,880 2,520.00 7,897.00  HGB 12.9 14.4 13.8  HCT 40 44 43  PLT 194 220 174   Lab Results  Component Value Date   TSH 0.47 10/17/2020   No results found for: HGBA1C No results found for: CHOL, HDL, LDLCALC, LDLDIRECT, TRIG, CHOLHDL  Significant Diagnostic Results in last 30 days:  No results found.  Assessment/Plan  Adult failure to thrive gradual weight loss, comfort measures, under Hospice service.    Frequent falls  frailty, needs assistance/supervision for transfer/ambulation, doesn't call for assistance consistently.    Gait instability w/c for mobility, needs assistance with transfer.    Memory deficits needs remainders, verbal cues frequently.    Hypothyroidism Hypothyroidism, takes Levothyroxine 137.5mcg qd since 02/27/21, TSH 0.47 10/17/21, pending f/u 8 weeks   Constipation  takes Colace 200mg qd, MOM prn, Senokot S  II qhs   COPD, mild (HCC) takes Allegra 60mg qd, DuoNebbid   Edema of both lower legs due to peripheral venous insufficiency chronic, takes Torsemide 20mg qod, 40mg qod. Bun/creat 46/1.24 3/18/  Chronic kidney disease (CKD), stage III (moderate)  Bun/creat 46/1.2 4 02/27/21   Osteoarthritis, multiple sites OA pain, R knee, X-ray negative fx 05/16/20, prn Tylenol.  TIA (transient ischemic attack) TIA ?, Hx of CAD, takes ASA 81mg qd.  Elevated liver enzymes Elevated LFT off Tylenol, RUQ US,10/21/20 US abd multiple gallstones in the gallbladder, largest 0.8cm. repeated AST299(was 299), ALT299(was 299),total bilirubin 2.0(was 1.7), Alk phos234(was 160)02/27/21     Family/ staff Communication: plan of care reviewed with the patient and charge nurse.   Labs/tests ordered:  None  Time spend 35 minutes.  

## 2021-04-20 NOTE — Assessment & Plan Note (Signed)
Elevated LFT off Tylenol, RUQ US,10/21/20 [place] abd multiple gallstones in the gallbladder, largest 0.8cm. repeated AST299(was 299), ALT299(was 299),total bilirubin 2.0(was 1.7), Alk phos234(was 160)02/27/21

## 2021-04-20 NOTE — Assessment & Plan Note (Signed)
takes Allegra 60mg  qd, DuoNebbid

## 2021-04-20 NOTE — Assessment & Plan Note (Signed)
Hypothyroidism, takes Levothyroxine 137. qd since 02/27/21, TSH 0.47 10/17/21, pending f/u 8 weeks

## 2021-04-21 ENCOUNTER — Encounter: Payer: Self-pay | Admitting: Nurse Practitioner

## 2021-04-24 LAB — TSH: TSH: 1.64 (ref 0.41–5.90)

## 2021-05-28 ENCOUNTER — Non-Acute Institutional Stay (SKILLED_NURSING_FACILITY): Payer: Medicare Other | Admitting: Nurse Practitioner

## 2021-05-28 ENCOUNTER — Encounter: Payer: Self-pay | Admitting: Nurse Practitioner

## 2021-05-28 DIAGNOSIS — M8949 Other hypertrophic osteoarthropathy, multiple sites: Secondary | ICD-10-CM

## 2021-05-28 DIAGNOSIS — R296 Repeated falls: Secondary | ICD-10-CM

## 2021-05-28 DIAGNOSIS — K5900 Constipation, unspecified: Secondary | ICD-10-CM

## 2021-05-28 DIAGNOSIS — G459 Transient cerebral ischemic attack, unspecified: Secondary | ICD-10-CM | POA: Diagnosis not present

## 2021-05-28 DIAGNOSIS — E890 Postprocedural hypothyroidism: Secondary | ICD-10-CM

## 2021-05-28 DIAGNOSIS — R748 Abnormal levels of other serum enzymes: Secondary | ICD-10-CM

## 2021-05-28 DIAGNOSIS — R627 Adult failure to thrive: Secondary | ICD-10-CM

## 2021-05-28 DIAGNOSIS — N1832 Chronic kidney disease, stage 3b: Secondary | ICD-10-CM | POA: Diagnosis not present

## 2021-05-28 DIAGNOSIS — M150 Primary generalized (osteo)arthritis: Secondary | ICD-10-CM

## 2021-05-28 DIAGNOSIS — R2681 Unsteadiness on feet: Secondary | ICD-10-CM

## 2021-05-28 DIAGNOSIS — J449 Chronic obstructive pulmonary disease, unspecified: Secondary | ICD-10-CM

## 2021-05-28 DIAGNOSIS — I872 Venous insufficiency (chronic) (peripheral): Secondary | ICD-10-CM

## 2021-05-28 DIAGNOSIS — M159 Polyosteoarthritis, unspecified: Secondary | ICD-10-CM

## 2021-05-28 DIAGNOSIS — R413 Other amnesia: Secondary | ICD-10-CM

## 2021-05-28 DIAGNOSIS — R600 Localized edema: Secondary | ICD-10-CM

## 2021-05-28 NOTE — Assessment & Plan Note (Signed)
TIA ?, Hx of CAD, takes ASA 81mg qd. 

## 2021-05-28 NOTE — Assessment & Plan Note (Signed)
takes Allegra 60mg qd, DuoNebbid  

## 2021-05-28 NOTE — Assessment & Plan Note (Signed)
Bun/creat 46/1.2 4 02/27/21

## 2021-05-28 NOTE — Assessment & Plan Note (Signed)
w/c for mobility, needs assistance with transfer. °

## 2021-05-28 NOTE — Assessment & Plan Note (Signed)
R knee, X-ray negative fx 05/16/20, prn Tylenol.

## 2021-05-28 NOTE — Assessment & Plan Note (Signed)
gradual weight loss, but stable in the past month, comfort measures, under Hospice service.

## 2021-05-28 NOTE — Assessment & Plan Note (Signed)
takes Colace 200mg  qd, MOM prn, Senokot S II qhs

## 2021-05-28 NOTE — Assessment & Plan Note (Signed)
off routine Tylenol, RUQ [place], 10/21/20 [place] abd multiple gallstones in the gallbladder, largest 0.8cm.   repeated AST 299(was 299), ALT 299(was 299),  total bilirubin 2.0(was 1.7), Alk phos 234(was 160) 02/27/21  stable clinically, update LFT

## 2021-05-28 NOTE — Assessment & Plan Note (Signed)
Frequent falls, frailty, needs assistance/supervision for transfer/ambulation, doesn't call for assistance consistently. Last fall 05/27/21 when the patient was found on the floor in living room, no injury noted.

## 2021-05-28 NOTE — Assessment & Plan Note (Signed)
takes Levothyroxine 137.5mcg qd since 02/27/21, TSH 1.64 04/24/21 

## 2021-05-28 NOTE — Assessment & Plan Note (Signed)
needs remainders, verbal cues frequently.   

## 2021-05-28 NOTE — Assessment & Plan Note (Signed)
Chronic issue, no apparent edema BLE,  Will decrease Torsemide 20mg  qd, update BMP one week.

## 2021-05-28 NOTE — Progress Notes (Signed)
Location:   [facility] Room Number: [phone] Place of Service:  SNF (31) Provider:  Amillion Scobee Otho Darner, NP    Patient Care Team: [name], MD as PCP - General (Internal Medicine) [place], [place], [facility] Wenda Vanschaick X, NP as Nurse Practitioner (Nurse Practitioner)  Extended Emergency Contact Information Primary Emergency Contact: [name] [place] Phone: [phone] Relation: Son Secondary Emergency Contact: [place] Phone: [phone] Mobile Phone: [phone] Relation: Daughter  Code Status:  DNR Goals of care: Advanced Directive information Advanced Directives 05/28/2021  Does Patient Have a Medical Advance Directive? Yes  Type of Paramedic of [place];Living will;Out of facility DNR (pink MOST or yellow form)  Does patient want to make changes to medical advance directive? No - Patient declined  Copy of [place] in Chart? Yes - validated most recent copy scanned in chart (See row information)  Would patient like information on creating a medical advance directive? -  Pre-existing out of facility DNR order (yellow form or pink MOST form) Yellow form placed in chart (order not valid for inpatient use);Pink MOST form placed in chart (order not valid for inpatient use)     Chief Complaint  Patient presents with   Medical Management of Chronic Issues    Routine follow up.    Health Maintenance    Discuss need for shingles vaccine.     HPI:  Pt is a 87 y.o. female seen today for medical management of chronic diseases.    FTT,  gradual weight loss, comfort measures, under Hospice service.              Frequent falls, frailty, needs assistance/supervision for transfer/ambulation, doesn't call for assistance consistently. Last fall 05/27/21 when the patient was found on the floor in living room, no injury noted.              Gait abnormality, w/c  for mobility, needs assistance with transfer.             Memory deficit, needs remainders, verbal cues frequently.             Hypothyroidism, takes Levothyroxine 137.35mg qd since 02/27/21, TSH 1.64 04/24/21             Constipation, takes Colace 2042mqd, MOM prn, Senokot S II qhs             COPD, takes Allegra 6024md, DuoNeb bid             Edema BLE, chronic, takes Torsemide 67m46md, 40mg48m. Bun/creat 46/1.24 02/27/21             CKD, Bun/creat 46/1.2 4 02/27/21             OA pain, R knee, X-ray negative fx 05/16/20, prn Tylenol.              TIA ?, Hx of CAD,  takes ASA 81mg 6m             Elevated LFT off routine Tylenol, RUQ US, 11[place]/21 US abdKoreaultiple gallstones in the gallbladder, largest 0.8cm.   repeated AST 299(was 299), ALT 299(was 299),  total bilirubin 2.0(was 1.7), Alk phos 234(was 160) 02/27/21    Past Medical History:  Diagnosis Date   Abnormal liver function tests    11/11/15 AST 31, ALT 88, alk phos 96    Acute kidney failure, unspecified (HCC) [place]/2013   Arthralgia 09/26/2014  Multiple joints: knees, shoulders, wrists, spine hips    Arthritis    Candidiasis of other urogenital sites 08/10/2012   Cerebral embolism 05/23/2005   CHF (congestive heart failure) ([place])    Cholelithiasis 11/04/2015   Closed fracture of sacrum and coccyx without mention of spinal cord injury 02/14/2012   Constipation 05/03/2013   Contusion of face, scalp, and neck except eye(s) 06/01/2012   Contusion of wrist 06/01/2012   COPD (chronic obstructive pulmonary disease) ([place])    COPD, mild ([place]) 01/20/2013   Edema 04/20/2012   H/O: CVA (cerebrovascular accident)    Hearing loss 09/26/2014   History of cancer of uterus    HTN (hypertension), benign    Hyperglycemia 11/14/2015   Hypothyroidism    Insomnia, unspecified 08/10/2012   Open wound of knee, leg (except thigh), and ankle, without mention of complication 0/08/6044   Other and unspecified hyperlipidemia 06/01/2012   Other disorder of coccyx  01/27/2012   Pain in joint, ankle and foot 06/14/2012   Peripheral vascular disease, unspecified ([place]) 01/27/2012   Personal history of fall 01/27/2012   Pneumonia, organism unspecified(486) 01/23/2005   Rheumatic fever 09/26/1933   Age 30 10/01/14 ESR 13, RAF <10    Seasonal allergies 05/03/2013   Stasis dermatitis of both legs 09/26/2014   Unspecified constipation 11/02/2012   Unspecified hearing loss 02/08/2013   Unspecified hereditary and idiopathic peripheral neuropathy 01/27/2012   Urinary frequency 02/24/2014   Ventricular fibrillation ([place]) 01/24/2012   Past Surgical History:  Procedure Laterality Date   ABDOMINAL HYSTERECTOMY  1990   for endometrial cancer   CATARACT EXTRACTION W/ INTRAOCULAR LENS  IMPLANT, [place]ierrez   bilateral   THYROIDECTOMY  1960   TONSILLECTOMY  1916    Allergies  Allergen Reactions   Amoxicillin     Unknown, patient unable to answer questionnaire    Aspirin Other (See Comments)    On MAR   Avelox [Moxifloxacin]     Unknown: listed on MAR   Erythromycin     Unknown: listed on MAR   Monistat [Miconazole]     Unknown: listed on MAR   Morphine And Related     Unknown: listed on MAR   Orange Juice [Orange Oil]     Unknown: listed on MAR    Allergies as of 05/28/2021       Reactions   Amoxicillin    Unknown, patient unable to answer questionnaire    Aspirin Other (See Comments)   On MAR   Avelox [moxifloxacin]    Unknown: listed on MAR   Erythromycin    Unknown: listed on MAR   Monistat [miconazole]    Unknown: listed on MAR   Morphine And Related    Unknown: listed on MAR   Orange Juice [orange Oil]    Unknown: listed on [facility]        Medication List        Accurate as of May 28, 2021 11:59 PM. If you have any questions, ask your nurse or doctor.          STOP taking these medications    calcium citrate-vitamin D 315-200 MG-UNIT tablet Commonly  known as: CITRACAL+D Stopped by: Yakub Lodes X Kenna Kirn, NP   CEROVITE SENIOR PO Stopped by: Fay Bagg X Marquin Patino, NP       TAKE these medications    aspirin 81 MG chewable tablet Chew 81 mg  by mouth daily.   carboxymethylcellulose 0.5 % Soln Commonly known as: REFRESH PLUS Place 1 drop into both eyes 2 (two) times daily.   fexofenadine 60 MG tablet Commonly known as: ALLEGRA Take 60 mg by mouth daily.   ipratropium-albuterol 0.5-2.5 (3) MG/3ML Soln Commonly known as: DUONEB Take 3 mLs by nebulization 2 (two) times daily.   levothyroxine 137 MCG tablet Commonly known as: SYNTHROID Take 137 mcg by mouth daily before breakfast.   melatonin 3 MG Tabs tablet Take 3 mg by mouth at bedtime.   OcuSoft Lid Scrub Allergy Pads Place 1 each into both eyes in the morning and at bedtime. Cleanse bilateral lids for excess eye secretions.   potassium chloride SA 20 MEQ tablet Commonly known as: KLOR-CON Take 20 mEq by mouth daily.   sennosides-docusate sodium 8.6-50 MG tablet Commonly known as: SENOKOT-S Take 2 tablets by mouth at bedtime.   torsemide 20 MG tablet Commonly known as: DEMADEX Take 40 mg by mouth every other day.   torsemide 20 MG tablet Commonly known as: DEMADEX Take 20 mg by mouth every other day.   zinc oxide 20 % ointment Apply 1 application topically daily as needed for irritation (To buttocks after every incontinent episode and for redness.).        Review of Systems  Constitutional:  Negative for activity change, fever and unexpected weight change.  HENT:  Positive for hearing loss. Negative for congestion and voice change.   Eyes:  Negative for visual disturbance.  Respiratory:  Positive for shortness of breath. Negative for cough.        DOE  Cardiovascular:  Negative for leg swelling.  Gastrointestinal:  Negative for abdominal pain and constipation.  Genitourinary:  Positive for frequency. Negative for dysuria and urgency.       Denied dysuria today.    Musculoskeletal:  Positive for arthralgias and gait problem.  Skin:  Negative for color change.  Neurological:  Negative for speech difficulty, weakness and light-headedness.       Memory lapses.   Psychiatric/Behavioral:  Negative for behavioral problems and sleep disturbance. The patient is not nervous/anxious.    Immunization History  Administered Date(s) Administered   Influenza Whole 10/19/2012, 09/15/2018   Influenza, High Dose Seasonal PF 09/14/2019   Influenza-Unspecified 10/18/2013, 10/17/2014, 09/02/2015, 09/29/2016, 10/03/2017, 09/23/2020   Moderna Sars-Covid-2 Vaccination 12/15/2019, 01/12/2020, 10/21/2020, 05/12/2021   PPD Test 01/22/2013, 11/28/2015   Pneumococcal Conjugate-13 11/18/2017   Pneumococcal Polysaccharide-23 01/22/2013   Td 02/21/2009   Tdap 04/07/2015   Pertinent  Health Maintenance Due  Topic Date Due   INFLUENZA VACCINE  07/13/2021   PNA vac Low Risk Adult  Completed   Fall Risk  09/05/2018 09/02/2017 11/14/2015 04/17/2015 09/26/2014  Falls in the past year? Yes No No Yes No  Number falls in past yr: 2 or more - - 1 -  Injury with Fall? Yes - - Yes -  Comment - - - laceration forehead 04/07/15 -  Risk Factor Category  - - - High Fall Risk -  Risk for fall due to : - - Impaired mobility;Impaired vision - -   Functional Status Survey:    Vitals:   05/28/21 0942  BP: 114/79  Pulse: 89  Resp: 18  Temp: 98 F (36.7 C)  SpO2: 93%  Weight: 120 lb 12.8 oz (54.8 kg)  Height: 5' (1.524 m)   Body mass index is 23.59 kg/m. Physical Exam Vitals and nursing note reviewed.  Constitutional:  Appearance: Normal appearance.  HENT:     Head: Normocephalic and atraumatic.     Mouth/Throat:     Mouth: Mucous membranes are moist.  Eyes:     Extraocular Movements: Extraocular movements intact.     Pupils: Pupils are equal, round, and reactive to light.  Cardiovascular:     Rate and Rhythm: Normal rate and regular rhythm.     Heart sounds: Murmur  heard.  Pulmonary:     Breath sounds: No rales.  Abdominal:     General: Bowel sounds are normal.     Palpations: Abdomen is soft.     Tenderness: There is no abdominal tenderness.  Musculoskeletal:     Cervical back: Normal range of motion and neck supple.     Right lower leg: No edema.     Left lower leg: No edema.  Skin:    General: Skin is warm and dry.     Comments: Scalp hematoma about a marble sized left occiput.   Neurological:     General: No focal deficit present.     Mental Status: She is alert. Mental status is at baseline.     Gait: Gait abnormal.     Comments: Oriented to person, place.   Psychiatric:        Mood and Affect: Mood normal.        Behavior: Behavior normal.    Labs reviewed: Recent Labs    06/25/20 0000 10/17/20 0000 02/27/21 0000  NA 141 140 146  K 4.1 4.3 3.6  CL 99 97* 106  CO2 31* 30* 28*  BUN 34* 33* 46*  CREATININE 1.2* 1.1 1.2*  CALCIUM 9.3 9.4 8.7   Recent Labs    10/20/20 0000 11/14/20 0000 02/27/21 0000  AST 99* 17 299*  ALT 299* 13 299*  ALKPHOS 171* 71 234*  ALBUMIN 3.8 3.2* 3.3*   Recent Labs    06/25/20 0000 10/17/20 0000 02/27/21 0000  WBC 10.0 5.0 10.6  NEUTROABS 5,880 2,520.00 7,897.00  HGB 12.9 14.4 13.8  HCT 40 44 43  PLT 194 220 174   Lab Results  Component Value Date   TSH 1.64 04/24/2021   No results found for: HGBA1C No results found for: CHOL, HDL, LDLCALC, LDLDIRECT, TRIG, CHOLHDL  Significant Diagnostic Results in last 30 days:  No results found.  Assessment/Plan Edema of both lower legs due to peripheral venous insufficiency Chronic issue, no apparent edema BLE,  Will decrease Torsemide 21m qd, update BMP one week.   Chronic kidney disease (CKD), stage III (moderate) Bun/creat 46/1.2 4 02/27/21  Osteoarthritis, multiple sites R knee, X-ray negative fx 05/16/20, prn Tylenol.   TIA (transient ischemic attack)  TIA ?, Hx of CAD,  takes ASA 874mqd.   Elevated liver enzymes off routine  Tylenol, RUQ US[place]11/9/21 USKoreabd multiple gallstones in the gallbladder, largest 0.8cm.   repeated AST 299(was 299), ALT 299(was 299),  total bilirubin 2.0(was 1.7), Alk phos 234(was 160) 02/27/21  stable clinically, update LFT  COPD, mild (HCC) takes Allegra 6074md, DuoNeb bid  Constipation takes Colace 200m54m, MOM prn, Senokot S II qhs  Hypothyroidism  takes Levothyroxine 137.5mcg52m since 02/27/21, TSH 1.64 04/24/21  Memory deficits needs remainders, verbal cues frequently.  Gait instability w/c for mobility, needs assistance with transfer.  Frequent falls Frequent falls, frailty, needs assistance/supervision for transfer/ambulation, doesn't call for assistance consistently. Last fall 05/27/21 when the patient was found on the floor in living room, no injury noted.  Adult failure to thrive gradual weight loss, but stable in the past month, comfort measures, under Hospice service.     Family/ staff Communication: plan of care reviewed with the patient and charge nurse.   Labs/tests ordered:  CMP/eGFR one week  Time spend 25 minutes.

## 2021-05-29 ENCOUNTER — Encounter: Payer: Self-pay | Admitting: Nurse Practitioner

## 2021-06-04 LAB — HEPATIC FUNCTION PANEL
ALT: 17 (ref 7–35)
AST: 16 (ref 13–35)
Alkaline Phosphatase: 100 (ref 25–125)
Bilirubin, Total: 0.7

## 2021-06-04 LAB — BASIC METABOLIC PANEL
BUN: 23 — AB (ref 4–21)
CO2: 31 — AB (ref 13–22)
Chloride: 95 — AB (ref 99–108)
Creatinine: 1.2 — AB (ref 0.5–1.1)
Glucose: 79
Potassium: 3.8 (ref 3.4–5.3)
Sodium: 136 — AB (ref 137–147)

## 2021-06-04 LAB — COMPREHENSIVE METABOLIC PANEL
Albumin: 3.2 — AB (ref 3.5–5.0)
Calcium: 8.7 (ref 8.7–10.7)
Globulin: 2.4

## 2021-06-05 ENCOUNTER — Encounter: Payer: Self-pay | Admitting: Nurse Practitioner

## 2021-06-05 ENCOUNTER — Non-Acute Institutional Stay (SKILLED_NURSING_FACILITY): Payer: Medicare Other | Admitting: Nurse Practitioner

## 2021-06-05 DIAGNOSIS — J449 Chronic obstructive pulmonary disease, unspecified: Secondary | ICD-10-CM

## 2021-06-05 DIAGNOSIS — N1832 Chronic kidney disease, stage 3b: Secondary | ICD-10-CM

## 2021-06-05 DIAGNOSIS — G459 Transient cerebral ischemic attack, unspecified: Secondary | ICD-10-CM

## 2021-06-05 DIAGNOSIS — R2681 Unsteadiness on feet: Secondary | ICD-10-CM

## 2021-06-05 DIAGNOSIS — K5900 Constipation, unspecified: Secondary | ICD-10-CM | POA: Diagnosis not present

## 2021-06-05 DIAGNOSIS — M159 Polyosteoarthritis, unspecified: Secondary | ICD-10-CM

## 2021-06-05 DIAGNOSIS — M8949 Other hypertrophic osteoarthropathy, multiple sites: Secondary | ICD-10-CM

## 2021-06-05 DIAGNOSIS — K8018 Calculus of gallbladder with other cholecystitis without obstruction: Secondary | ICD-10-CM

## 2021-06-05 DIAGNOSIS — I872 Venous insufficiency (chronic) (peripheral): Secondary | ICD-10-CM

## 2021-06-05 DIAGNOSIS — U071 COVID-19: Secondary | ICD-10-CM | POA: Diagnosis not present

## 2021-06-05 DIAGNOSIS — R600 Localized edema: Secondary | ICD-10-CM

## 2021-06-05 DIAGNOSIS — R413 Other amnesia: Secondary | ICD-10-CM

## 2021-06-05 DIAGNOSIS — E890 Postprocedural hypothyroidism: Secondary | ICD-10-CM

## 2021-06-05 NOTE — Progress Notes (Deleted)
.  psc 

## 2021-06-05 NOTE — Assessment & Plan Note (Signed)
takes Levothyroxine 137. qd since 02/27/21, TSH 1.64 04/24/21

## 2021-06-05 NOTE — Progress Notes (Signed)
Location:   [place] Room Number: 28 Place of Service:  SNF (31) Provider:  Reilly Molchan X, NP  [name], MD  Patient Care Team: [name], MD as PCP - General (Internal Medicine) [facility] Nyomie Ehrlich X, NP as Nurse Practitioner (Nurse Practitioner)  Extended Emergency Contact Information Primary Emergency Contact: [name] [place] Phone: [phone] Relation: Son Secondary Emergency Contact: [place] Phone: [phone] Mobile Phone: [phone] Relation: Daughter  Code Status:  DNR Goals of care: Advanced Directive information Advanced Directives 06/05/2021  Does Patient Have a Medical Advance Directive? Yes  Type of Paramedic of [place];Out of facility DNR (pink MOST or yellow form)  Does patient want to make changes to medical advance directive? No - Patient declined  Copy of [place] in Chart? Yes - validated most recent copy scanned in chart (See row information)  Would patient like information on creating a medical advance directive? -  Pre-existing out of facility DNR order (yellow form or pink MOST form) Yellow form placed in chart (order not valid for inpatient use)     Chief Complaint  Patient presents with   Acute Visit    COVID positive    HPI:  Pt is a 87 y.o. female seen today for an acute visit for the patient's cough, tested positive COVID. No change of her appetite/oral intakes, she transferred self to the commode which is her baseline function/energy level. No noted nasal congestion or O2 desaturation. She is afebrile, denied chest pain or SOB    Gait abnormality, w/c for mobility, needs assistance with transfer.             Memory deficit, needs remainders, verbal cues frequently.             Hypothyroidism, takes Levothyroxine 137.32mg qd since 02/27/21, TSH 1.64 04/24/21              Constipation, takes Colace 2090mqd, MOM prn, Senokot S II qhs             COPD, takes Allegra 6055md, DuoNeb bid             Edema BLE, chronic, minimal BLE, mostly on top of feet,  takes Torsemide 58m33m             CKD, Bun/creat 23/1.15 eGFR 37             OA pain, R knee, X-ray negative fx 05/16/20, prn Tylenol.              TIA ?, Hx of CAD,  takes ASA 81mg28m              Elevated LFT off routine Tylenol, RUQ US, 1[place]9/21 US abKoreamultiple gallstones in the gallbladder, largest 0.8cm.   repeated AST 299(was 299), ALT 299(was 299),  total bilirubin 2.0(was 1.7), Alk phos 234(was 160) 02/27/21    Past Medical History:  Diagnosis Date   Abnormal liver function tests    11/11/15 AST 31, ALT 88, alk phos 96    Acute kidney failure, unspecified (HCC) [place]1/2013   Arthralgia 09/26/2014   Multiple joints: knees, shoulders, wrists, spine hips    Arthritis    Candidiasis of other urogenital sites 08/10/2012   Cerebral embolism 05/23/2005   CHF (congestive heart failure) (HCC) [place] 11/04/2015   Closed fracture of sacrum and coccyx without mention of spinal  cord injury 02/14/2012   Constipation 05/03/2013   Contusion of face, scalp, and neck except eye(s) 06/01/2012   Contusion of wrist 06/01/2012   COPD (chronic obstructive pulmonary disease) ([place])    COPD, mild ([place]) 01/20/2013   Edema 04/20/2012   H/O: CVA (cerebrovascular accident)    Hearing loss 09/26/2014   History of cancer of uterus    HTN (hypertension), benign    Hyperglycemia 11/14/2015   Hypothyroidism    Insomnia, unspecified 08/10/2012   Open wound of knee, leg (except thigh), and ankle, without mention of complication 02/11/9517   Other and unspecified hyperlipidemia 06/01/2012   Other disorder of coccyx 01/27/2012   Pain in joint, ankle and foot 06/14/2012   Peripheral vascular disease, unspecified ([place]) 01/27/2012   Personal history of fall 01/27/2012   Pneumonia, organism unspecified(486) 01/23/2005   Rheumatic fever  09/26/1933   Age 71 10/01/14 ESR 13, RAF <10    Seasonal allergies 05/03/2013   Stasis dermatitis of both legs 09/26/2014   Unspecified constipation 11/02/2012   Unspecified hearing loss 02/08/2013   Unspecified hereditary and idiopathic peripheral neuropathy 01/27/2012   Urinary frequency 02/24/2014   Ventricular fibrillation ([place]) 01/24/2012   Past Surgical History:  Procedure Laterality Date   ABDOMINAL HYSTERECTOMY  1990   for endometrial cancer   CATARACT EXTRACTION W/ INTRAOCULAR LENS  IMPLANT, [place]   bilateral   THYROIDECTOMY  1960   TONSILLECTOMY  1916    Allergies  Allergen Reactions   Amoxicillin     Unknown, patient unable to answer questionnaire    Aspirin Other (See Comments)    On MAR   Avelox [Moxifloxacin]     Unknown: listed on MAR   Erythromycin     Unknown: listed on MAR   Monistat [Miconazole]     Unknown: listed on MAR   Morphine And Related     Unknown: listed on MAR   Orange Juice [Orange Oil]     Unknown: listed on MAR    Allergies as of 06/05/2021       Reactions   Amoxicillin    Unknown, patient unable to answer questionnaire    Aspirin Other (See Comments)   On MAR   Avelox [moxifloxacin]    Unknown: listed on MAR   Erythromycin    Unknown: listed on MAR   Monistat [miconazole]    Unknown: listed on MAR   Morphine And Related    Unknown: listed on MAR   Orange Juice [orange Oil]    Unknown: listed on [facility]        Medication List        Accurate as of June 05, 2021  4:19 PM. If you have any questions, ask your nurse or doctor.          aspirin 81 MG chewable tablet Chew 81 mg by mouth daily.   carboxymethylcellulose 0.5 % Soln Commonly known as: REFRESH PLUS Place 1 drop into both eyes 2 (two) times daily.   fexofenadine 60 MG tablet Commonly known as: ALLEGRA Take 60 mg by mouth daily.   ipratropium-albuterol 0.5-2.5 (3) MG/3ML  Soln Commonly known as: DUONEB Take 3 mLs by nebulization 2 (two) times daily.   levothyroxine 137 MCG tablet Commonly known as: SYNTHROID Take 137 mcg by mouth daily before breakfast.   melatonin 3 MG Tabs tablet Take 3 mg by mouth at bedtime.  OcuSoft Lid Scrub Allergy Pads Place 1 each into both eyes in the morning and at bedtime. Cleanse bilateral lids for excess eye secretions.   potassium chloride SA 20 MEQ tablet Commonly known as: KLOR-CON Take 20 mEq by mouth daily.   sennosides-docusate sodium 8.6-50 MG tablet Commonly known as: SENOKOT-S Take 2 tablets by mouth at bedtime.   torsemide 20 MG tablet Commonly known as: DEMADEX Take 40 mg by mouth every other day.   torsemide 20 MG tablet Commonly known as: DEMADEX Take 20 mg by mouth every other day.   zinc oxide 20 % ointment Apply 1 application topically daily as needed for irritation (To buttocks after every incontinent episode and for redness.).        Review of Systems  Constitutional:  Negative for activity change, appetite change, fatigue and fever.  HENT:  Positive for hearing loss. Negative for congestion, rhinorrhea, sinus pressure, sinus pain, sore throat and voice change.   Eyes:  Negative for visual disturbance.  Respiratory:  Positive for cough and shortness of breath.        DOE  Cardiovascular:  Negative for leg swelling.  Gastrointestinal:  Negative for abdominal pain and constipation.  Genitourinary:  Positive for frequency. Negative for dysuria and urgency.       Denied dysuria today.   Musculoskeletal:  Positive for arthralgias and gait problem.  Skin:  Negative for color change.  Neurological:  Negative for speech difficulty, weakness and light-headedness.       Memory lapses.   Psychiatric/Behavioral:  Negative for behavioral problems and sleep disturbance. The patient is not nervous/anxious.    Immunization History  Administered Date(s) Administered   Influenza Whole 10/19/2012,  09/15/2018   Influenza, High Dose Seasonal PF 09/14/2019   Influenza-Unspecified 10/18/2013, 10/17/2014, 09/02/2015, 09/29/2016, 10/03/2017, 09/23/2020   Moderna Sars-Covid-2 Vaccination 12/15/2019, 01/12/2020, 10/21/2020, 05/12/2021   PPD Test 01/22/2013, 11/28/2015   Pneumococcal Conjugate-13 11/18/2017   Pneumococcal Polysaccharide-23 01/22/2013   Td 02/21/2009   Tdap 04/07/2015   Pertinent  Health Maintenance Due  Topic Date Due   INFLUENZA VACCINE  07/13/2021   PNA vac Low Risk Adult  Completed   Fall Risk  09/05/2018 09/02/2017 11/14/2015 04/17/2015 09/26/2014  Falls in the past year? Yes No No Yes No  Number falls in past yr: 2 or more - - 1 -  Injury with Fall? Yes - - Yes -  Comment - - - laceration forehead 04/07/15 -  Risk Factor Category  - - - High Fall Risk -  Risk for fall due to : - - Impaired mobility;Impaired vision - -   Functional Status Survey:    Vitals:   06/05/21 1151  BP: (!) 145/74  Pulse: 84  Resp: 18  Temp: 97.9 F (36.6 C)  SpO2: 95%  Weight: 121 lb (54.9 kg)  Height: 5' (1.524 m)   Body mass index is 23.63 kg/m. Physical Exam Vitals and nursing note reviewed.  Constitutional:      Appearance: Normal appearance.  HENT:     Head: Normocephalic and atraumatic.     Nose: Nose normal. No congestion or rhinorrhea.     Mouth/Throat:     Mouth: Mucous membranes are moist.     Pharynx: No oropharyngeal exudate or posterior oropharyngeal erythema.  Eyes:     Extraocular Movements: Extraocular movements intact.     Pupils: Pupils are equal, round, and reactive to light.  Cardiovascular:     Rate and Rhythm: Normal rate and regular rhythm.  Heart sounds: Murmur heard.  Pulmonary:     Breath sounds: No rales.  Abdominal:     General: Bowel sounds are normal.     Palpations: Abdomen is soft.     Tenderness: There is no abdominal tenderness.  Musculoskeletal:     Cervical back: Normal range of motion and neck supple.     Right lower leg: No  edema.     Left lower leg: No edema.  Skin:    General: Skin is warm and dry.     Comments: Scalp hematoma about a marble sized left occiput.   Neurological:     General: No focal deficit present.     Mental Status: She is alert. Mental status is at baseline.     Gait: Gait abnormal.     Comments: Oriented to person, place.   Psychiatric:        Mood and Affect: Mood normal.        Behavior: Behavior normal.    Labs reviewed: Recent Labs    06/25/20 0000 10/17/20 0000 02/27/21 0000  NA 141 140 146  K 4.1 4.3 3.6  CL 99 97* 106  CO2 31* 30* 28*  BUN 34* 33* 46*  CREATININE 1.2* 1.1 1.2*  CALCIUM 9.3 9.4 8.7   Recent Labs    10/20/20 0000 11/14/20 0000 02/27/21 0000  AST 99* 17 299*  ALT 299* 13 299*  ALKPHOS 171* 71 234*  ALBUMIN 3.8 3.2* 3.3*   Recent Labs    06/25/20 0000 10/17/20 0000 02/27/21 0000  WBC 10.0 5.0 10.6  NEUTROABS 5,880 2,520.00 7,897.00  HGB 12.9 14.4 13.8  HCT 40 44 43  PLT 194 220 174   Lab Results  Component Value Date   TSH 1.64 04/24/2021   No results found for: HGBA1C No results found for: CHOL, HDL, LDLCALC, LDLDIRECT, TRIG, CHOLHDL  Significant Diagnostic Results in last 30 days:  No results found.  Assessment/Plan COVID-19 virus infection the patient's cough, tested positive COVID. No change of her appetite/oral intakes, she transferred self to the commode which is her baseline function/energy level. No noted nasal congestion or O2 desaturation. She is afebrile, denied chest pain or SOB. May consider Paxlovid 100/143m bid x 5 days if HPOA desires, otherwise will continue to monitor the patient. The patient is under Hospice service for comfort care.   Edema of both lower legs due to peripheral venous insufficiency Edema BLE, chronic, minimal BLE, mostly on top of feet,  takes Torsemide 278mqd. Bun/creat 46/1.24 02/27/21  COPD, mild (HCC) takes Allegra 6090md, DuoNeb bid  Constipation takes Colace 200m60m, MOM prn,  Senokot S II qhs  Hypothyroidism takes Levothyroxine 137.5mcg60m since 02/27/21, TSH 1.64 04/24/21  Memory deficits needs remainders, verbal cues frequently.  Gait instability w/c for mobility, needs assistance with transfer.  Chronic kidney disease (CKD), stage III (moderate) CKD, Bun/creat 23/1.15 eGFR 37  Osteoarthritis, multiple sites OA pain, R knee, X-ray negative fx 05/16/20, prn Tylenol.   TIA (transient ischemic attack) TIA ?, Hx of CAD,  takes ASA 81mg 19m  Cholelithiasis Elevated LFT off routine Tylenol, RUQ US, 11[place]/21 US abdKoreaultiple gallstones in the gallbladder, largest 0.8cm.   repeated AST 299(was 299), ALT 299(was 299),  total bilirubin 2.0(was 1.7), Alk phos 234(was 160) 02/27/21      Family/ staff Communication: plan of care reviewed with the patient and charge nurse.   Labs/tests ordered:   none  Time spend 35 minutes.

## 2021-06-05 NOTE — Assessment & Plan Note (Signed)
takes Allegra 60mg qd, DuoNebbid  

## 2021-06-05 NOTE — Assessment & Plan Note (Signed)
needs remainders, verbal cues frequently.   

## 2021-06-05 NOTE — Assessment & Plan Note (Signed)
TIA ?, Hx of CAD, takes ASA 81mg qd. 

## 2021-06-05 NOTE — Assessment & Plan Note (Signed)
Elevated LFT off routine Tylenol, RUQ [place], 10/21/20 [place] abd multiple gallstones in the gallbladder, largest 0.8cm.   repeated AST 299(was 299), ALT 299(was 299),  total bilirubin 2.0(was 1.7), Alk phos 234(was 160) 02/27/21

## 2021-06-05 NOTE — Assessment & Plan Note (Signed)
Edema BLE, chronic, minimal BLE, mostly on top of feet,  takes Torsemide 20mg  qd. Bun/creat 46/1.24 02/27/21

## 2021-06-05 NOTE — Assessment & Plan Note (Signed)
OA pain, R knee, X-ray negative fx 05/16/20, prn Tylenol.

## 2021-06-05 NOTE — Assessment & Plan Note (Signed)
CKD, Bun/creat 23/1.15 eGFR 37

## 2021-06-05 NOTE — Assessment & Plan Note (Signed)
w/c for mobility, needs assistance with transfer. °

## 2021-06-05 NOTE — Assessment & Plan Note (Signed)
takes Colace 200mg qd, MOM prn, Senokot S II qhs 

## 2021-06-05 NOTE — Assessment & Plan Note (Signed)
the patient's cough, tested positive COVID. No change of her appetite/oral intakes, she transferred self to the commode which is her baseline function/energy level. No noted nasal congestion or O2 desaturation. She is afebrile, denied chest pain or SOB. May consider Paxlovid 100/150mg  bid x 5 days if HPOA desires, otherwise will continue to monitor the patient. The patient is under Hospice service for comfort care.

## 2021-06-08 ENCOUNTER — Encounter: Payer: Self-pay | Admitting: Nurse Practitioner

## 2021-06-08 ENCOUNTER — Non-Acute Institutional Stay (SKILLED_NURSING_FACILITY): Payer: Medicare Other | Admitting: Nurse Practitioner

## 2021-06-08 DIAGNOSIS — N1832 Chronic kidney disease, stage 3b: Secondary | ICD-10-CM

## 2021-06-08 DIAGNOSIS — U071 COVID-19: Secondary | ICD-10-CM

## 2021-06-08 DIAGNOSIS — M8949 Other hypertrophic osteoarthropathy, multiple sites: Secondary | ICD-10-CM

## 2021-06-08 DIAGNOSIS — G459 Transient cerebral ischemic attack, unspecified: Secondary | ICD-10-CM

## 2021-06-08 DIAGNOSIS — M150 Primary generalized (osteo)arthritis: Secondary | ICD-10-CM

## 2021-06-08 DIAGNOSIS — M159 Polyosteoarthritis, unspecified: Secondary | ICD-10-CM

## 2021-06-08 DIAGNOSIS — R296 Repeated falls: Secondary | ICD-10-CM | POA: Diagnosis not present

## 2021-06-08 DIAGNOSIS — R600 Localized edema: Secondary | ICD-10-CM

## 2021-06-08 DIAGNOSIS — R413 Other amnesia: Secondary | ICD-10-CM | POA: Diagnosis not present

## 2021-06-08 DIAGNOSIS — R748 Abnormal levels of other serum enzymes: Secondary | ICD-10-CM

## 2021-06-08 DIAGNOSIS — J449 Chronic obstructive pulmonary disease, unspecified: Secondary | ICD-10-CM

## 2021-06-08 DIAGNOSIS — R2681 Unsteadiness on feet: Secondary | ICD-10-CM

## 2021-06-08 DIAGNOSIS — S60211A Contusion of right wrist, initial encounter: Secondary | ICD-10-CM

## 2021-06-08 DIAGNOSIS — I872 Venous insufficiency (chronic) (peripheral): Secondary | ICD-10-CM

## 2021-06-08 DIAGNOSIS — K5900 Constipation, unspecified: Secondary | ICD-10-CM

## 2021-06-08 DIAGNOSIS — E890 Postprocedural hypothyroidism: Secondary | ICD-10-CM

## 2021-06-08 NOTE — Assessment & Plan Note (Signed)
Gait abnormality, w/c for mobility, needs assistance with transfer. 

## 2021-06-08 NOTE — Assessment & Plan Note (Signed)
Cough, tested positive COVID, otherwise she is in her usual state of health, HPOA desired delay of Paxlovid.

## 2021-06-08 NOTE — Assessment & Plan Note (Signed)
TIA ?, Hx of CAD, takes ASA 81mg qd. 

## 2021-06-08 NOTE — Assessment & Plan Note (Signed)
R knee, X-ray negative fx 05/16/20, prn Tylenol.  

## 2021-06-08 NOTE — Assessment & Plan Note (Signed)
fall when the patient was found on the floor, yelled help, resultant of bruises, abrasions. Frequent falls, due to frailty and unsteady of BLE. Close supervision/assistance for safety. Continue Hospice service for comfort measures.

## 2021-06-08 NOTE — Assessment & Plan Note (Signed)
takes Colace 200mg qd, MOM prn, Senokot S II qhs 

## 2021-06-08 NOTE — Assessment & Plan Note (Signed)
Bun/creat 23/1.15 eGFR 37 06/04/21 

## 2021-06-08 NOTE — Assessment & Plan Note (Addendum)
chronic, minimal BLE, mostly on top of feet,  will reduce  Torsemide 10mg  qd insetting of decreased oral intakes, minimal swelling, and recurrent falls.

## 2021-06-08 NOTE — Assessment & Plan Note (Signed)
takes Allegra 60mg qd, DuoNebbid  

## 2021-06-08 NOTE — Assessment & Plan Note (Signed)
Memory deficit, needs remainders, verbal cues frequently.  

## 2021-06-08 NOTE — Assessment & Plan Note (Signed)
Elevated LFT off routine Tylenol, RUQ [place], 10/21/20 [place] abd multiple gallstones in the gallbladder, largest 0.8cm.

## 2021-06-08 NOTE — Progress Notes (Addendum)
Location:   [facility] Room Number: [phone] Place of Service:  SNF (31) Provider:  Chariti Havel Otho Darner, NP    Patient Care Team: [name], MD as PCP - General (Internal Medicine) [place], [place], [facility] Neyra X, NP as Nurse Practitioner (Nurse Practitioner)  Extended Emergency Contact Information Primary Emergency Contact: [name] Phone: [phone] Relation: Son Secondary Emergency Contact: [place] Phone: [phone] Mobile Phone: [phone] Relation: Daughter  Code Status:  DNR Goals of care: Advanced Directive information Advanced Directives 06/08/2021  Does Patient Have a Medical Advance Directive? Yes  Type of Paramedic of [place];Living will;Out of facility DNR (pink MOST or yellow form)  Does patient want to make changes to medical advance directive? No - Patient declined  Copy of [place] in Chart? Yes - validated most recent copy scanned in chart (See row information)  Would patient like information on creating a medical advance directive? -  Pre-existing out of facility DNR order (yellow form or pink MOST form) Yellow form placed in chart (order not valid for inpatient use);Pink MOST form placed in chart (order not valid for inpatient use)     Chief Complaint  Patient presents with   Acute Visit    Patient presents today after a fall.    HPI:  Pt is a 87 y.o. female seen today for an acute visit for fall when the patient was found on the floor, yelled help, resultant of bruises, abrasions of the right hand, wrist, mild swelling, able to ROM with mild discomfort.   Frequent falls, due to frailty and unsteady of BLE  Cough, tested positive COVID, otherwise she is in her usual state of health, HPOA desired delay of Paxlovid.                           Gait abnormality, w/c for mobility, needs assistance with  transfer.             Memory deficit, needs remainders, verbal cues frequently.             Hypothyroidism, takes Levothyroxine 137.44mg qd since 02/27/21, TSH 1.64 04/24/21             Constipation, takes Colace 2046mqd, MOM prn, Senokot S II qhs             COPD, takes Allegra 6095md, DuoNeb bid             Edema BLE, chronic, minimal BLE, mostly on top of feet,  takes Torsemide 38m67m             CKD, Bun/creat 23/1.15 eGFR 37 06/04/21             OA pain, R knee, X-ray negative fx 05/16/20, prn Tylenol.              TIA ?, Hx of CAD,  takes ASA 81mg63m              Elevated LFT off routine Tylenol, RUQ US, 1[place]9/21 US abKoreamultiple gallstones in the gallbladder, largest 0.8cm.      Past Medical History:  Diagnosis Date   Abnormal liver function tests    11/11/15 AST 31, ALT 88, alk phos 96    Acute kidney failure, unspecified (HCC) [place]1/2013   Arthralgia 09/26/2014   Multiple joints: knees, shoulders, wrists, spine hips  Arthritis    Candidiasis of other urogenital sites 08/10/2012   Cerebral embolism 05/23/2005   CHF (congestive heart failure) ([place])    Cholelithiasis 11/04/2015   Closed fracture of sacrum and coccyx without mention of spinal cord injury 02/14/2012   Constipation 05/03/2013   Contusion of face, scalp, and neck except eye(s) 06/01/2012   Contusion of wrist 06/01/2012   COPD (chronic obstructive pulmonary disease) ([place])    COPD, mild ([place]) 01/20/2013   Edema 04/20/2012   H/O: CVA (cerebrovascular accident)    Hearing loss 09/26/2014   History of cancer of uterus    HTN (hypertension), benign    Hyperglycemia 11/14/2015   Hypothyroidism    Insomnia, unspecified 08/10/2012   Open wound of knee, leg (except thigh), and ankle, without mention of complication 0/0/1749   Other and unspecified hyperlipidemia 06/01/2012   Other disorder of coccyx 01/27/2012   Pain in joint, ankle and foot 06/14/2012   Peripheral vascular disease, unspecified ([place]) 01/27/2012   Personal history of  fall 01/27/2012   Pneumonia, organism unspecified(486) 01/23/2005   Rheumatic fever 09/26/1933   Age 53 10/01/14 ESR 13, RAF <10    Seasonal allergies 05/03/2013   Stasis dermatitis of both legs 09/26/2014   Unspecified constipation 11/02/2012   Unspecified hearing loss 02/08/2013   Unspecified hereditary and idiopathic peripheral neuropathy 01/27/2012   Urinary frequency 02/24/2014   Ventricular fibrillation ([place]) 01/24/2012   Past Surgical History:  Procedure Laterality Date   ABDOMINAL HYSTERECTOMY  1990   for endometrial cancer   CATARACT EXTRACTION W/ INTRAOCULAR LENS  IMPLANT, [place]   bilateral   THYROIDECTOMY  1960   TONSILLECTOMY  1916    Allergies  Allergen Reactions   Amoxicillin     Unknown, patient unable to answer questionnaire    Aspirin Other (See Comments)    On MAR   Avelox [Moxifloxacin]     Unknown: listed on MAR   Erythromycin     Unknown: listed on MAR   Monistat [Miconazole]     Unknown: listed on MAR   Morphine And Related     Unknown: listed on MAR   Orange Juice [Orange Oil]     Unknown: listed on MAR    Allergies as of 06/08/2021       Reactions   Amoxicillin    Unknown, patient unable to answer questionnaire    Aspirin Other (See Comments)   On MAR   Avelox [moxifloxacin]    Unknown: listed on MAR   Erythromycin    Unknown: listed on MAR   Monistat [miconazole]    Unknown: listed on MAR   Morphine And Related    Unknown: listed on MAR   Orange Juice [orange Oil]    Unknown: listed on [facility]        Medication List        Accurate as of June 08, 2021 11:59 PM. If you have any questions, ask your nurse or doctor.          aspirin 81 MG chewable tablet Chew 81 mg by mouth daily.   carboxymethylcellulose 0.5 % Soln Commonly known as: REFRESH PLUS Place 1 drop into both eyes 2 (two) times daily.   fexofenadine 60 MG tablet Commonly known as:  ALLEGRA Take 60 mg by mouth daily.   ipratropium-albuterol 0.5-2.5 (3) MG/3ML Soln Commonly known as: DUONEB Take 3 mLs by nebulization  2 (two) times daily.   levothyroxine 137 MCG tablet Commonly known as: SYNTHROID Take 137 mcg by mouth daily before breakfast.   melatonin 3 MG Tabs tablet Take 3 mg by mouth at bedtime.   OcuSoft Lid Scrub Allergy Pads Place 1 each into both eyes in the morning and at bedtime. Cleanse bilateral lids for excess eye secretions.   potassium chloride SA 20 MEQ tablet Commonly known as: KLOR-CON Take 20 mEq by mouth daily.   sennosides-docusate sodium 8.6-50 MG tablet Commonly known as: SENOKOT-S Take 2 tablets by mouth at bedtime.   torsemide 20 MG tablet Commonly known as: DEMADEX Take 40 mg by mouth every other day.   torsemide 20 MG tablet Commonly known as: DEMADEX Take 20 mg by mouth every other day.   zinc oxide 20 % ointment Apply 1 application topically daily as needed for irritation (To buttocks after every incontinent episode and for redness.).        Review of Systems  Constitutional:  Negative for appetite change, fatigue and fever.  HENT:  Positive for hearing loss. Negative for congestion and voice change.   Eyes:  Negative for visual disturbance.  Respiratory:  Positive for cough and shortness of breath.        DOE  Cardiovascular:  Positive for leg swelling.  Gastrointestinal:  Negative for abdominal pain and constipation.  Genitourinary:  Positive for frequency. Negative for dysuria and urgency.       Denied dysuria today.   Musculoskeletal:  Positive for arthralgias and gait problem.  Skin:  Negative for color change.       Bruises.   Neurological:  Negative for speech difficulty, weakness and light-headedness.       Memory lapses.   Psychiatric/Behavioral:  Negative for behavioral problems and sleep disturbance. The patient is not nervous/anxious.    Immunization History  Administered Date(s) Administered    Influenza Whole 10/19/2012, 09/15/2018   Influenza, High Dose Seasonal PF 09/14/2019   Influenza-Unspecified 10/18/2013, 10/17/2014, 09/02/2015, 09/29/2016, 10/03/2017, 09/23/2020   Moderna Sars-Covid-2 Vaccination 12/15/2019, 01/12/2020, 10/21/2020, 05/12/2021   PPD Test 01/22/2013, 11/28/2015   Pneumococcal Conjugate-13 11/18/2017   Pneumococcal Polysaccharide-23 01/22/2013   Td 02/21/2009   Tdap 04/07/2015   Pertinent  Health Maintenance Due  Topic Date Due   INFLUENZA VACCINE  07/13/2021   PNA vac Low Risk Adult  Completed   Fall Risk  09/05/2018 09/02/2017 11/14/2015 04/17/2015 09/26/2014  Falls in the past year? Yes No No Yes No  Number falls in past yr: 2 or more - - 1 -  Injury with Fall? Yes - - Yes -  Comment - - - laceration forehead 04/07/15 -  Risk Factor Category  - - - High Fall Risk -  Risk for fall due to : - - Impaired mobility;Impaired vision - -   Functional Status Survey:    Vitals:   06/08/21 1514  BP: 110/72  Pulse: 74  Resp: 18  Temp: 97.9 F (36.6 C)  SpO2: 96%  Weight: 121 lb (54.9 kg)  Height: 5' (1.524 m)   Body mass index is 23.63 kg/m. Physical Exam Vitals and nursing note reviewed.  Constitutional:      Appearance: Normal appearance.  HENT:     Head: Normocephalic and atraumatic.     Nose: Nose normal. No congestion or rhinorrhea.     Mouth/Throat:     Mouth: Mucous membranes are moist.     Pharynx: No oropharyngeal exudate or posterior oropharyngeal erythema.  Eyes:  Extraocular Movements: Extraocular movements intact.     Pupils: Pupils are equal, round, and reactive to light.  Cardiovascular:     Rate and Rhythm: Normal rate and regular rhythm.     Heart sounds: Murmur heard.  Pulmonary:     Breath sounds: No rales.  Abdominal:     General: Bowel sounds are normal.     Palpations: Abdomen is soft.     Tenderness: There is no abdominal tenderness.  Musculoskeletal:     Cervical back: Normal range of motion and neck supple.      Right lower leg: Edema present.     Left lower leg: Edema present.     Comments: trace  Skin:    General: Skin is warm and dry.     Comments: bruises, abrasions of the right hand, wrist, mild swelling, able to ROM with mild discomfort.   Neurological:     General: No focal deficit present.     Mental Status: She is alert. Mental status is at baseline.     Gait: Gait abnormal.     Comments: Oriented to person, place.   Psychiatric:        Mood and Affect: Mood normal.        Behavior: Behavior normal.    Labs reviewed: Recent Labs    06/25/20 0000 10/17/20 0000 02/27/21 0000  NA 141 140 146  K 4.1 4.3 3.6  CL 99 97* 106  CO2 31* 30* 28*  BUN 34* 33* 46*  CREATININE 1.2* 1.1 1.2*  CALCIUM 9.3 9.4 8.7   Recent Labs    10/20/20 0000 11/14/20 0000 02/27/21 0000  AST 99* 17 299*  ALT 299* 13 299*  ALKPHOS 171* 71 234*  ALBUMIN 3.8 3.2* 3.3*   Recent Labs    06/25/20 0000 10/17/20 0000 02/27/21 0000  WBC 10.0 5.0 10.6  NEUTROABS 5,880 2,520.00 7,897.00  HGB 12.9 14.4 13.8  HCT 40 44 43  PLT 194 220 174   Lab Results  Component Value Date   TSH 1.64 04/24/2021   No results found for: HGBA1C No results found for: CHOL, HDL, LDLCALC, LDLDIRECT, TRIG, CHOLHDL  Significant Diagnostic Results in last 30 days:  No results found.  Assessment/Plan Frequent falls fall when the patient was found on the floor, yelled help, resultant of bruises, abrasions. Frequent falls, due to frailty and unsteady of BLE. Close supervision/assistance for safety. Continue Hospice service for comfort measures.   COVID-19 virus infection Cough, tested positive COVID, otherwise she is in her usual state of health, HPOA desired delay of Paxlovid.   Gait instability Gait abnormality, w/c for mobility, needs assistance with transfer.  Memory deficits Memory deficit, needs remainders, verbal cues frequently.  Hypothyroidism akes Levothyroxine 137.70mg qd since 02/27/21, TSH 1.64  04/24/21  Constipation takes Colace 2064mqd, MOM prn, Senokot S II qhs  COPD, mild (HCC) takes Allegra 6061md, DuoNeb bid  Edema of both lower legs due to peripheral venous insufficiency chronic, minimal BLE, mostly on top of feet,  will reduce  Torsemide 58m30m insetting of decreased oral intakes, minimal swelling, and recurrent falls.   Chronic kidney disease (CKD), stage III (moderate) Bun/creat 23/1.15 eGFR 37 06/04/21  Osteoarthritis, multiple sites R knee, X-ray negative fx 05/16/20, prn Tylenol.   TIA (transient ischemic attack)  TIA ?, Hx of CAD,  takes ASA 81mg104m  Elevated liver enzymes Elevated LFT off routine Tylenol, RUQ US, 1[place]9/21 US abKoreamultiple gallstones in the gallbladder, largest 0.8cm.  Contusion of right wrist bruises, abrasions of the right hand, wrist, mild swelling, able to ROM with mild discomfort. May X-ray R hand/wrist if needed.     Family/ staff Communication: plan of care reviewed with the patient and charge nurse.   Labs/tests ordered:  none  Time spend 25 minutes.

## 2021-06-08 NOTE — Assessment & Plan Note (Signed)
akes Levothyroxine 137. qd since 02/27/21, TSH 1.64 04/24/21

## 2021-06-09 ENCOUNTER — Encounter: Payer: Self-pay | Admitting: Nurse Practitioner

## 2021-06-09 DIAGNOSIS — S60211A Contusion of right wrist, initial encounter: Secondary | ICD-10-CM | POA: Insufficient documentation

## 2021-06-09 NOTE — Assessment & Plan Note (Signed)
bruises, abrasions of the right hand, wrist, mild swelling, able to ROM with mild discomfort. May X-ray R hand/wrist if needed.

## 2021-06-18 ENCOUNTER — Encounter: Payer: Self-pay | Admitting: Nurse Practitioner

## 2021-06-18 ENCOUNTER — Non-Acute Institutional Stay (SKILLED_NURSING_FACILITY): Payer: Medicare Other | Admitting: Nurse Practitioner

## 2021-06-18 DIAGNOSIS — R296 Repeated falls: Secondary | ICD-10-CM

## 2021-06-18 DIAGNOSIS — I872 Venous insufficiency (chronic) (peripheral): Secondary | ICD-10-CM

## 2021-06-18 DIAGNOSIS — K8018 Calculus of gallbladder with other cholecystitis without obstruction: Secondary | ICD-10-CM

## 2021-06-18 DIAGNOSIS — M2550 Pain in unspecified joint: Secondary | ICD-10-CM

## 2021-06-18 DIAGNOSIS — L89322 Pressure ulcer of left buttock, stage 2: Secondary | ICD-10-CM

## 2021-06-18 DIAGNOSIS — E890 Postprocedural hypothyroidism: Secondary | ICD-10-CM

## 2021-06-18 DIAGNOSIS — U071 COVID-19: Secondary | ICD-10-CM

## 2021-06-18 DIAGNOSIS — R413 Other amnesia: Secondary | ICD-10-CM

## 2021-06-18 DIAGNOSIS — K5900 Constipation, unspecified: Secondary | ICD-10-CM

## 2021-06-18 DIAGNOSIS — N1832 Chronic kidney disease, stage 3b: Secondary | ICD-10-CM

## 2021-06-18 DIAGNOSIS — J449 Chronic obstructive pulmonary disease, unspecified: Secondary | ICD-10-CM

## 2021-06-18 DIAGNOSIS — R627 Adult failure to thrive: Secondary | ICD-10-CM

## 2021-06-18 DIAGNOSIS — G459 Transient cerebral ischemic attack, unspecified: Secondary | ICD-10-CM

## 2021-06-18 DIAGNOSIS — R600 Localized edema: Secondary | ICD-10-CM

## 2021-06-18 DIAGNOSIS — R2681 Unsteadiness on feet: Secondary | ICD-10-CM

## 2021-06-18 NOTE — Assessment & Plan Note (Signed)
TIA ?, Hx of CAD, takes ASA 81mg qd. 

## 2021-06-18 NOTE — Assessment & Plan Note (Signed)
Hypothyroidism, takes Levothyroxine 137.5mcg qd since 02/27/21, TSH 1.64 04/24/21 

## 2021-06-18 NOTE — Assessment & Plan Note (Signed)
Elevated LFT off routine Tylenol, RUQ US, 10/21/20 US abd multiple gallstones in the gallbladder, largest 0.8cm.    

## 2021-06-18 NOTE — Assessment & Plan Note (Signed)
Frequent falls, due to frailty and unsteady of BLE

## 2021-06-18 NOTE — Assessment & Plan Note (Signed)
stage 2 pressure ulcer left superior medial buttock/lower sacral area, hydrocolloid dressing intact. Assist the patient with repositioning frequently for pressure reduction.

## 2021-06-18 NOTE — Assessment & Plan Note (Signed)
OA pain, R knee, X-ray negative fx 05/16/20, prn Tylenol.  

## 2021-06-18 NOTE — Assessment & Plan Note (Signed)
Gait abnormality, w/c for mobility, needs assistance with transfer.

## 2021-06-18 NOTE — Assessment & Plan Note (Addendum)
takes Colace 200mg qd, MOM prn, Senokot S II qhs 

## 2021-06-18 NOTE — Assessment & Plan Note (Signed)
Adult failure to thrive, under hospice service, weight loss about 5-6 Ibs in the past 2 weeks. Continue SNF [facility] for supportive care.

## 2021-06-18 NOTE — Assessment & Plan Note (Signed)
Edema BLE, chronic, minimal BLE, mostly on top of feet,  weight loss about #5Ibs in the past 2 weeks, decrease  Torsemide to 10mg  MWF.

## 2021-06-18 NOTE — Progress Notes (Signed)
Location:   [facility] Room Number: [phone] Place of Service:  SNF (31) Provider:  Malasia Torain Otho Darner, NP    Patient Care Team: [name], MD as PCP - General (Internal Medicine) [place], [place] Scarlett Portlock X, NP as Nurse Practitioner (Nurse Practitioner)  Extended Emergency Contact Information Primary Emergency Contact: [name] [place] Phone: [phone] Relation: Son Secondary Emergency Contact: [place] Phone: [phone] Mobile Phone: [phone] Relation: Daughter  Code Status:  DNR Goals of care: Advanced Directive information Advanced Directives 06/18/2021  Does Patient Have a Medical Advance Directive? Yes  Type of Paramedic of [place];Living will;Out of facility DNR (pink MOST or yellow form)  Does patient want to make changes to medical advance directive? No - Patient declined  Copy of [place] in Chart? Yes - validated most recent copy scanned in chart (See row information)  Would patient like information on creating a medical advance directive? -  Pre-existing out of facility DNR order (yellow form or pink MOST form) Yellow form placed in chart (order not valid for inpatient use);Pink MOST form placed in chart (order not valid for inpatient use)     Chief Complaint  Patient presents with   Acute Visit    Patient presents for a pressure ulcer    HPI:  Pt is a 87 y.o. female seen today for an acute visit for stage 2 pressure ulcer left superior medial buttock/lower sacral area, hydrocolloid dressing intact.   Adult failure to thrive, under hospice service, weight loss about 5-6 Ibs in the past 2 weeks.   Frequent falls, due to frailty and unsteady of BLE             COVID infection, declined Paxlovid, appetite has been decreased.              Gait abnormality, w/c for mobility, needs assistance with transfer.              Memory deficit, needs remainders, verbal cues frequently.             Hypothyroidism, takes Levothyroxine 137.47mg qd since 02/27/21, TSH 1.64 04/24/21             Constipation, takes Colace 2059mqd, MOM prn, Senokot S II qhs             COPD, takes Allegra 6082md, DuoNeb bid, prn O2 via [place]             Edema BLE, chronic, minimal BLE, mostly on top of feet,  takes Torsemide             CKD, Bun/creat 23/1.15 eGFR 37 06/04/21             OA pain, R knee, X-ray negative fx 05/16/20, prn Tylenol.              TIA ?, Hx of CAD,  takes ASA 24m49m.              Elevated LFT off routine Tylenol, RUQ [place], [place]/9/21 [place] multiple gallstones in the gallbladder, largest 0.8cm.     Past Medical History:  Diagnosis Date   Abnormal liver function tests    11/11/15 AST 31, ALT 88, alk phos 96    Acute kidney failure, unspecified (HCC)[place]11/2013   Arthralgia 09/26/2014   Multiple joints: knees, shoulders, wrists, spine hips    Arthritis    Candidiasis of other  urogenital sites 08/10/2012   Cerebral embolism 05/23/2005   CHF (congestive heart failure) ([place])    Cholelithiasis 11/04/2015   Closed fracture of sacrum and coccyx without mention of spinal cord injury 02/14/2012   Constipation 05/03/2013   Contusion of face, scalp, and neck except eye(s) 06/01/2012   Contusion of wrist 06/01/2012   COPD (chronic obstructive pulmonary disease) ([place])    COPD, mild ([place]) 01/20/2013   Edema 04/20/2012   H/O: CVA (cerebrovascular accident)    Hearing loss 09/26/2014   History of cancer of uterus    HTN (hypertension), benign    Hyperglycemia 11/14/2015   Hypothyroidism    Insomnia, unspecified 08/10/2012   Open wound of knee, leg (except thigh), and ankle, without mention of complication 03/18/6255   Other and unspecified hyperlipidemia 06/01/2012   Other disorder of coccyx 01/27/2012   Pain in joint, ankle and foot 06/14/2012   Peripheral vascular disease, unspecified ([place]) 01/27/2012   Personal history of fall 01/27/2012    Pneumonia, organism unspecified(486) 01/23/2005   Rheumatic fever 09/26/1933   Age 13 10/01/14 ESR 13, RAF <10    Seasonal allergies 05/03/2013   Stasis dermatitis of both legs 09/26/2014   Unspecified constipation 11/02/2012   Unspecified hearing loss 02/08/2013   Unspecified hereditary and idiopathic peripheral neuropathy 01/27/2012   Urinary frequency 02/24/2014   Ventricular fibrillation ([place]) 01/24/2012   Past Surgical History:  Procedure Laterality Date   ABDOMINAL HYSTERECTOMY  1990   for endometrial cancer   CATARACT EXTRACTION W/ INTRAOCULAR LENS  IMPLANT, BILATERAL     ECTOPIC [place]   bilateral   THYROIDECTOMY  1960   TONSILLECTOMY  1916    Allergies  Allergen Reactions   Amoxicillin     Unknown, patient unable to answer questionnaire    Aspirin Other (See Comments)    On MAR   Avelox [Moxifloxacin]     Unknown: listed on MAR   Erythromycin     Unknown: listed on MAR   Monistat [Miconazole]     Unknown: listed on MAR   Morphine And Related     Unknown: listed on MAR   Orange Juice [Orange Oil]     Unknown: listed on MAR    Allergies as of 06/18/2021       Reactions   Amoxicillin    Unknown, patient unable to answer questionnaire    Aspirin Other (See Comments)   On MAR   Avelox [moxifloxacin]    Unknown: listed on MAR   Erythromycin    Unknown: listed on MAR   Monistat [miconazole]    Unknown: listed on MAR   Morphine And Related    Unknown: listed on MAR   Orange Juice [orange Oil]    Unknown: listed on [facility]        Medication List        Accurate as of June 18, 2021 12:44 PM. If you have any questions, ask your nurse or doctor.          aspirin 81 MG chewable tablet Chew 81 mg by mouth daily.   carboxymethylcellulose 0.5 % Soln Commonly known as: REFRESH PLUS Place 1 drop into both eyes 2 (two) times daily.   fexofenadine 60 MG tablet Commonly known as: ALLEGRA Take 60 mg by  mouth daily.   ipratropium-albuterol 0.5-2.5 (3) MG/3ML Soln Commonly known as: DUONEB Take 3 mLs by nebulization 2 (two) times daily.   levothyroxine  137 MCG tablet Commonly known as: SYNTHROID Take 137 mcg by mouth daily before breakfast.   melatonin 3 MG Tabs tablet Take 3 mg by mouth at bedtime.   OcuSoft Lid Scrub Allergy Pads Place 1 each into both eyes in the morning and at bedtime. Cleanse bilateral lids for excess eye secretions.   potassium chloride SA 20 MEQ tablet Commonly known as: KLOR-CON Take 20 mEq by mouth daily.   sennosides-docusate sodium 8.6-50 MG tablet Commonly known as: SENOKOT-S Take 2 tablets by mouth at bedtime.   torsemide 10 MG tablet Commonly known as: DEMADEX Take 10 mg by mouth daily. What changed: Another medication with the same name was removed. Continue taking this medication, and follow the directions you see here. Changed by: Roxine Whittinghill X Kimberlyn Quiocho, NP   zinc oxide 20 % ointment Apply 1 application topically daily as needed for irritation (To buttocks after every incontinent episode and for redness.).        Review of Systems  Constitutional:  Positive for appetite change and unexpected weight change. Negative for fatigue and fever.  HENT:  Positive for hearing loss. Negative for congestion and voice change.   Eyes:  Negative for visual disturbance.  Respiratory:  Positive for cough and shortness of breath.        DOE  Cardiovascular:  Positive for leg swelling.  Gastrointestinal:  Negative for abdominal pain and constipation.  Genitourinary:  Positive for frequency. Negative for dysuria and urgency.       Denied dysuria today.   Musculoskeletal:  Positive for arthralgias and gait problem.  Skin:  Positive for wound. Negative for color change.       Bruises. Left buttock pressure ulcer  Neurological:  Negative for speech difficulty, weakness and light-headedness.       Memory lapses.   Psychiatric/Behavioral:  Negative for behavioral  problems and sleep disturbance. The patient is not nervous/anxious.    Immunization History  Administered Date(s) Administered   Influenza Whole 10/19/2012, 09/15/2018   Influenza, High Dose Seasonal PF 09/14/2019   Influenza-Unspecified 10/18/2013, 10/17/2014, 09/02/2015, 09/29/2016, 10/03/2017, 09/23/2020   Moderna Sars-Covid-2 Vaccination 12/15/2019, 01/12/2020, 10/21/2020, 05/12/2021   PPD Test 01/22/2013, 11/28/2015   Pneumococcal Conjugate-13 11/18/2017   Pneumococcal Polysaccharide-23 01/22/2013   Td 02/21/2009   Tdap 04/07/2015   Pertinent  Health Maintenance Due  Topic Date Due   INFLUENZA VACCINE  07/13/2021   PNA vac Low Risk Adult  Completed   Fall Risk  09/05/2018 09/02/2017 11/14/2015 04/17/2015 09/26/2014  Falls in the past year? Yes No No Yes No  Number falls in past yr: 2 or more - - 1 -  Injury with Fall? Yes - - Yes -  Comment - - - laceration forehead 04/07/15 -  Risk Factor Category  - - - High Fall Risk -  Risk for fall due to : - - Impaired mobility;Impaired vision - -   Functional Status Survey:    Vitals:   06/18/21 1043  BP: 136/60  Pulse: 72  Resp: 16  Temp: 98.2 F (36.8 C)  SpO2: 94%  Weight: 116 lb 3.2 oz (52.7 kg)  Height: 5' (1.524 m)   Body mass index is 22.69 kg/m. Physical Exam Vitals and nursing note reviewed.  Constitutional:      Appearance: Normal appearance.  HENT:     Head: Normocephalic and atraumatic.     Nose: Nose normal. No congestion or rhinorrhea.     Mouth/Throat:     Mouth: Mucous membranes are moist.  Pharynx: No oropharyngeal exudate or posterior oropharyngeal erythema.  Eyes:     Extraocular Movements: Extraocular movements intact.     Pupils: Pupils are equal, round, and reactive to light.  Cardiovascular:     Rate and Rhythm: Normal rate and regular rhythm.     Heart sounds: Murmur heard.  Pulmonary:     Breath sounds: No rales.  Abdominal:     General: Bowel sounds are normal.     Palpations: Abdomen  is soft.     Tenderness: There is no abdominal tenderness.  Musculoskeletal:     Cervical back: Normal range of motion and neck supple.     Right lower leg: Edema present.     Left lower leg: Edema present.     Comments: trace  Skin:    General: Skin is warm and dry.     Findings: Bruising present.     Comments: bruises, abrasions of the right hand, wrist, mild swelling-resolving. Left superior medial buttock pressure ulcer is covered in hydrocolloid dressing, no peri wound warmth, swelling, or pain noted.   Neurological:     General: No focal deficit present.     Mental Status: She is alert. Mental status is at baseline.     Gait: Gait abnormal.     Comments: Oriented to person, place.   Psychiatric:        Mood and Affect: Mood normal.        Behavior: Behavior normal.    Labs reviewed: Recent Labs    06/25/20 0000 10/17/20 0000 02/27/21 0000  NA 141 140 146  K 4.1 4.3 3.6  CL 99 97* 106  CO2 31* 30* 28*  BUN 34* 33* 46*  CREATININE 1.2* 1.1 1.2*  CALCIUM 9.3 9.4 8.7   Recent Labs    10/20/20 0000 11/14/20 0000 02/27/21 0000  AST 99* 17 299*  ALT 299* 13 299*  ALKPHOS 171* 71 234*  ALBUMIN 3.8 3.2* 3.3*   Recent Labs    06/25/20 0000 10/17/20 0000 02/27/21 0000  WBC 10.0 5.0 10.6  NEUTROABS 5,880 2,520.00 7,897.00  HGB 12.9 14.4 13.8  HCT 40 44 43  PLT 194 220 174   Lab Results  Component Value Date   TSH 1.64 04/24/2021   No results found for: HGBA1C No results found for: CHOL, HDL, LDLCALC, LDLDIRECT, TRIG, CHOLHDL  Significant Diagnostic Results in last 30 days:  No results found.  Assessment/Plan Pressure ulcer of left buttock, stage 2 (HCC) stage 2 pressure ulcer left superior medial buttock/lower sacral area, hydrocolloid dressing intact. Assist the patient with repositioning frequently for pressure reduction.   Adult failure to thrive Adult failure to thrive, under hospice service, weight loss about 5-6 Ibs in the past 2 weeks. Continue  SNF [facility] for supportive care.   Frequent falls Frequent falls, due to frailty and unsteady of BLE  COVID-19 virus infection COVID infection, declined Paxlovid, appetite has been decreased.   Gait instability Gait abnormality, w/c for mobility, needs assistance with transfer.  Memory deficits Memory deficit, needs remainders, verbal cues frequently.  Hypothyroidism Hypothyroidism, takes Levothyroxine 137.53mg qd since 02/27/21, TSH 1.64 04/24/21  Constipation takes Colace 2030mqd, MOM prn, Senokot S II qhs  COPD, mild (HCC) takes Allegra 6092md, DuoNeb bid, prn O2 via [place]  Edema of both lower legs due to peripheral venous insufficiency Edema BLE, chronic, minimal BLE, mostly on top of feet,  weight loss about #5Ibs in the past 2 weeks, decrease  Torsemide to 51m39mF.  Chronic kidney disease (CKD), stage III (moderate) Bun/creat 23/1.15 eGFR 37 06/04/21  Arthralgia OA pain, R knee, X-ray negative fx 05/16/20, prn Tylenol.   TIA (transient ischemic attack) TIA ?, Hx of CAD,  takes ASA 99m qd.   Cholelithiasis Elevated LFT off routine Tylenol, RUQ [place] 10/21/20 [place] multiple gallstones in the gallbladder, largest 0.8cm.    Family/ staff Communication: plan of care reviewed with the patient and charge nurse.   Labs/tests ordered:  none  Time spend 25 minutes.

## 2021-06-18 NOTE — Assessment & Plan Note (Signed)
Memory deficit, needs remainders, verbal cues frequently.

## 2021-06-18 NOTE — Assessment & Plan Note (Signed)
takes Allegra 60mg  qd, DuoNeb bid, prn O2 via [place]

## 2021-06-18 NOTE — Assessment & Plan Note (Signed)
Bun/creat 23/1.15 eGFR 37 06/04/21 

## 2021-06-18 NOTE — Assessment & Plan Note (Signed)
COVID infection, declined Paxlovid, appetite has been decreased.

## 2021-07-06 ENCOUNTER — Non-Acute Institutional Stay (SKILLED_NURSING_FACILITY): Payer: Medicare Other | Admitting: Nurse Practitioner

## 2021-07-06 ENCOUNTER — Emergency Department (HOSPITAL_COMMUNITY)

## 2021-07-06 ENCOUNTER — Other Ambulatory Visit: Payer: Self-pay

## 2021-07-06 ENCOUNTER — Emergency Department (HOSPITAL_COMMUNITY)
Admission: EM | Admit: 2021-07-06 | Discharge: 2021-07-06 | Disposition: A | Discharge status: Home / Self Care | Attending: Emergency Medicine | Admitting: Emergency Medicine

## 2021-07-06 ENCOUNTER — Encounter: Payer: Self-pay | Admitting: Nurse Practitioner

## 2021-07-06 DIAGNOSIS — I872 Venous insufficiency (chronic) (peripheral): Secondary | ICD-10-CM

## 2021-07-06 DIAGNOSIS — Z8616 Personal history of COVID-19: Secondary | ICD-10-CM | POA: Insufficient documentation

## 2021-07-06 DIAGNOSIS — R296 Repeated falls: Secondary | ICD-10-CM | POA: Diagnosis not present

## 2021-07-06 DIAGNOSIS — S8002XA Contusion of left knee, initial encounter: Secondary | ICD-10-CM | POA: Insufficient documentation

## 2021-07-06 DIAGNOSIS — R2681 Unsteadiness on feet: Secondary | ICD-10-CM

## 2021-07-06 DIAGNOSIS — I2510 Atherosclerotic heart disease of native coronary artery without angina pectoris: Secondary | ICD-10-CM | POA: Diagnosis not present

## 2021-07-06 DIAGNOSIS — E890 Postprocedural hypothyroidism: Secondary | ICD-10-CM

## 2021-07-06 DIAGNOSIS — S0181XD Laceration without foreign body of other part of head, subsequent encounter: Secondary | ICD-10-CM | POA: Diagnosis not present

## 2021-07-06 DIAGNOSIS — R600 Localized edema: Secondary | ICD-10-CM

## 2021-07-06 DIAGNOSIS — R413 Other amnesia: Secondary | ICD-10-CM

## 2021-07-06 DIAGNOSIS — R627 Adult failure to thrive: Secondary | ICD-10-CM | POA: Diagnosis not present

## 2021-07-06 DIAGNOSIS — I130 Hypertensive heart and chronic kidney disease with heart failure and stage 1 through stage 4 chronic kidney disease, or unspecified chronic kidney disease: Secondary | ICD-10-CM | POA: Insufficient documentation

## 2021-07-06 DIAGNOSIS — Z7982 Long term (current) use of aspirin: Secondary | ICD-10-CM | POA: Diagnosis not present

## 2021-07-06 DIAGNOSIS — Z79899 Other long term (current) drug therapy: Secondary | ICD-10-CM | POA: Insufficient documentation

## 2021-07-06 DIAGNOSIS — U071 COVID-19: Secondary | ICD-10-CM

## 2021-07-06 DIAGNOSIS — J449 Chronic obstructive pulmonary disease, unspecified: Secondary | ICD-10-CM | POA: Diagnosis not present

## 2021-07-06 DIAGNOSIS — I509 Heart failure, unspecified: Secondary | ICD-10-CM | POA: Insufficient documentation

## 2021-07-06 DIAGNOSIS — S40011A Contusion of right shoulder, initial encounter: Secondary | ICD-10-CM | POA: Insufficient documentation

## 2021-07-06 DIAGNOSIS — L89322 Pressure ulcer of left buttock, stage 2: Secondary | ICD-10-CM | POA: Diagnosis not present

## 2021-07-06 DIAGNOSIS — W19XXXA Unspecified fall, initial encounter: Secondary | ICD-10-CM | POA: Insufficient documentation

## 2021-07-06 DIAGNOSIS — Z7951 Long term (current) use of inhaled steroids: Secondary | ICD-10-CM | POA: Diagnosis not present

## 2021-07-06 DIAGNOSIS — K8018 Calculus of gallbladder with other cholecystitis without obstruction: Secondary | ICD-10-CM

## 2021-07-06 DIAGNOSIS — G459 Transient cerebral ischemic attack, unspecified: Secondary | ICD-10-CM

## 2021-07-06 DIAGNOSIS — S8001XA Contusion of right knee, initial encounter: Secondary | ICD-10-CM | POA: Diagnosis not present

## 2021-07-06 DIAGNOSIS — N183 Chronic kidney disease, stage 3 unspecified: Secondary | ICD-10-CM | POA: Diagnosis not present

## 2021-07-06 DIAGNOSIS — E039 Hypothyroidism, unspecified: Secondary | ICD-10-CM | POA: Diagnosis not present

## 2021-07-06 DIAGNOSIS — K5900 Constipation, unspecified: Secondary | ICD-10-CM

## 2021-07-06 DIAGNOSIS — S0181XA Laceration without foreign body of other part of head, initial encounter: Secondary | ICD-10-CM | POA: Diagnosis not present

## 2021-07-06 DIAGNOSIS — M2550 Pain in unspecified joint: Secondary | ICD-10-CM

## 2021-07-06 DIAGNOSIS — S0990XA Unspecified injury of head, initial encounter: Secondary | ICD-10-CM | POA: Diagnosis present

## 2021-07-06 DIAGNOSIS — N1832 Chronic kidney disease, stage 3b: Secondary | ICD-10-CM

## 2021-07-06 MED ORDER — LIDOCAINE-EPINEPHRINE (PF) 2 %-1:200000 IJ SOLN
10.0000 mL | Freq: Once | INTRAMUSCULAR | Status: AC
  Administered 2021-07-06: 10 mL
  Filled 2021-07-06: qty 20

## 2021-07-06 NOTE — ED Triage Notes (Signed)
Pt BIB [facility] from [facility]. Pt go up this morning to go to the bathroom had an unwitnessed fall. Sustained injury right side of face. Presents to ED with laceration/hematoma right forehead. C-collar in place.   SNF staff reports multiple recent falls. Also report pt is A&Ox0. Pt grimaces to palpation of right shoulder, bilateral knees/ankles.

## 2021-07-06 NOTE — ED Notes (Signed)
c-colar removed by EDP

## 2021-07-06 NOTE — ED Notes (Signed)
Patient transported to CT/XR ?

## 2021-07-06 NOTE — ED Notes (Signed)
[facility] called  

## 2021-07-06 NOTE — ED Provider Notes (Signed)
[facility] EMERGENCY DEPARTMENT Provider Note   CSN: [id number] Arrival date & time: 07/06/21  0600     History Chief Complaint  Patient presents with   Lytle Michaels    [name] is a 87 y.o. female.   Fall Level 5 caveat due to altered mental status. [facility].  Reportedly on hospice.  Frequent falls.   had unwitnessed fall.  Laceration and hematoma to forehead.  Tenderness to right shoulder.  Patient however will not speak for me or follow commands.  Sitting in bed with eyes closed.     Past Medical History:  Diagnosis Date   Abnormal liver function tests    11/11/15 AST 31, ALT 88, alk phos 96    Acute kidney failure, unspecified ([place]) 01/24/2012   Arthralgia 09/26/2014   Multiple joints: knees, shoulders, wrists, spine hips    Arthritis    Candidiasis of other urogenital sites 08/10/2012   Cerebral embolism 05/23/2005   CHF (congestive heart failure) ([place])    Cholelithiasis 11/04/2015   Closed fracture of sacrum and coccyx without mention of spinal cord injury 02/14/2012   Constipation 05/03/2013   Contusion of face, scalp, and neck except eye(s) 06/01/2012   Contusion of wrist 06/01/2012   COPD (chronic obstructive pulmonary disease) (HCC)    COPD, mild ([place]) 01/20/2013   Edema 04/20/2012   H/O: CVA (cerebrovascular accident)    Hearing loss 09/26/2014   History of cancer of uterus    HTN (hypertension), benign    Hyperglycemia 11/14/2015   Hypothyroidism    Insomnia, unspecified 08/10/2012   Open wound of knee, leg (except thigh), and ankle, without mention of complication 05/21/6167   Other and unspecified hyperlipidemia 06/01/2012   Other disorder of coccyx 01/27/2012   Pain in joint, ankle and foot 06/14/2012   Peripheral vascular disease, unspecified ([place]) 01/27/2012   Personal history of fall 01/27/2012   Pneumonia, organism unspecified(486) 01/23/2005   Rheumatic fever 09/26/1933   Age 65 10/01/14 ESR 13, RAF  <10    Seasonal allergies 05/03/2013   Stasis dermatitis of both legs 09/26/2014   Unspecified constipation 11/02/2012   Unspecified hearing loss 02/08/2013   Unspecified hereditary and idiopathic peripheral neuropathy 01/27/2012   Urinary frequency 02/24/2014   Ventricular fibrillation ([place]) 01/24/2012    Patient Active Problem List   Diagnosis Date Noted   Pressure ulcer of left buttock, stage 2 ([place]) 06/18/2021   Contusion of right wrist 06/09/2021   COVID-19 virus infection 06/05/2021   Elevated liver enzymes 03/26/2021   Adult failure to thrive 03/26/2021   Dysuria 01/22/2021   Memory deficits 12/31/2020   Osteoarthritis, multiple sites 12/18/2020   Weight loss 10/14/2020   TIA (transient ischemic attack) 10/13/2020   Chronic kidney disease (CKD), stage III (moderate) 08/15/2019   Allergy 06/21/2019   Blepharitis of both eyes 05/14/2019   Pressure injury of sacral region, stage 1 04/07/2019   Edema of both lower legs due to peripheral venous insufficiency 03/21/2019   Advanced care planning/counseling discussion 09/01/2018   Frequent falls 08/18/2018   CAD (coronary artery disease) 07/17/2018   PVD (peripheral vascular disease) ([place]) 02/21/2018   Pressure ulcer 06/02/2016   Compression fracture of lumbar vertebra ([place]) 11/14/2015   Cholelithiasis 11/04/2015   Gait instability 09/26/2014   Arthralgia 09/26/2014   Hearing loss 09/26/2014   Constipation 05/03/2013   Neuropathic pain of both legs 05/03/2013   COPD, mild ([place]) 01/20/2013   HTN (hypertension) 01/20/2013  Hypothyroidism 01/20/2013    Past Surgical History:  Procedure Laterality Date   ABDOMINAL HYSTERECTOMY  1990   for endometrial cancer   CATARACT EXTRACTION W/ INTRAOCULAR LENS  IMPLANT, BILATERAL     ECTOPIC PREGNANCY SURGERY  1952   GUM SURGERY  1932   MASTOIDECTOMY  1920   bilateral   THYROIDECTOMY  1960   TONSILLECTOMY  1916     OB History   No obstetric history on file.     Family History   Problem Relation Age of Onset   Stroke Mother    Heart disease Father     Social History   Tobacco Use   Smoking status: Never   Smokeless tobacco: Never  Substance Use Topics   Alcohol use: No   Drug use: No    Home Medications Prior to Admission medications   Medication Sig Start Date End Date Taking? Authorizing Provider  aspirin 81 MG chewable tablet Chew 81 mg by mouth daily.    [provider]  carboxymethylcellulose (REFRESH PLUS) 0.5 % SOLN Place 1 drop into both eyes 2 (two) times daily. 04/20/18   [provider]  Eyelid Cleansers (OCUSOFT LID SCRUB ALLERGY) PADS Place 1 each into both eyes in the morning and at bedtime. Cleanse bilateral lids for excess eye secretions.    [provider]  fexofenadine (ALLEGRA) 60 MG tablet Take 60 mg by mouth daily.    [provider]  ipratropium-albuterol (DUONEB) 0.5-2.5 (3) MG/3ML SOLN Take 3 mLs by nebulization 2 (two) times daily.     [provider]  levothyroxine (SYNTHROID) 137 MCG tablet Take 137 mcg by mouth daily before breakfast.    [provider]  Melatonin 3 MG TABS Take 3 mg by mouth at bedtime.     [provider]  potassium chloride SA (K-DUR) 20 MEQ tablet Take 20 mEq by mouth daily.    [provider]  sennosides-docusate sodium (SENOKOT-S) 8.6-50 MG tablet Take 2 tablets by mouth at bedtime.     [provider]  torsemide (DEMADEX) 10 MG tablet Take 10 mg by mouth daily.    [provider]  zinc oxide 20 % ointment Apply 1 application topically daily as needed for irritation (To buttocks after every incontinent episode and for redness.).     [provider]    Allergies    Amoxicillin, Aspirin, Avelox [moxifloxacin], Erythromycin, Monistat [miconazole], Morphine and related, and Orange juice [orange oil]  Review of Systems   Review of Systems  Unable to perform ROS: Mental status change   Physical Exam Updated  Vital Signs BP (!) 180/84   Pulse 83   Temp 97.6 F (36.4 C) (Oral)   Resp 20   Ht 5' (1.524 m)   Wt 45.4 kg   SpO2 96%   BMI 19.53 kg/m   Physical Exam Vitals and nursing note reviewed.  Constitutional:      Comments: Sitting in bed with cervical collar in eyes closed.  HENT:     Head:     Comments: Approximately 2 cm laceration to right forehead. Eyes:     Comments: Patient is holding her eyes closed.  Neck:     Comments: Cervical collar in place.  No midline tenderness. Cardiovascular:     Rate and Rhythm: Regular rhythm.  Pulmonary:     Breath sounds: No wheezing or rhonchi.  Abdominal:     Tenderness: There is no abdominal tenderness.  Musculoskeletal:     Comments: Mild  tenderness to right shoulder with ecchymosis.  Mild bruising to bilateral knees.  No hip tenderness.  No elbow or wrist tenderness.  Neurological:     Comments: Sitting in bed with eyes closed.  Will not follow commands.  Nonverbal for me.  Forcefully keeping eyes closed    ED Results / Procedures / Treatments   Labs (all labs ordered are listed, but only abnormal results are displayed) Labs Reviewed - No data to display  EKG None  Radiology DG Chest 1 View  Result Date: 07/06/2021 CLINICAL DATA:  Fall Altered mental status EXAM: CHEST  1 VIEW COMPARISON:  01/19/2013 FINDINGS: Heart size within normal limits. No pulmonary vascular congestion. Lungs are clear. IMPRESSION: No acute cardiopulmonary process. Electronically Signed   By: Miachel Roux M.D.   On: 07/06/2021 07:30   DG Pelvis 1-2 Views  Result Date: 07/06/2021 CLINICAL DATA:  Fall Altered mental status EXAM: PELVIS - 1-2 VIEW COMPARISON:  10/13/2019 FINDINGS: Advanced degenerative changes seen in the lower lumbar spine. Mild degenerative changes of the right sacroiliac joint. Mild degenerative changes of the pubic symphysis. No acute fracture or dislocation. IMPRESSION: No acute abnormality of the pelvis. Electronically Signed   By:  Miachel Roux M.D.   On: 07/06/2021 07:26   DG Shoulder Right  Result Date: 07/06/2021 CLINICAL DATA:  Status post fall.  Altered mental status. EXAM: RIGHT SHOULDER - 2+ VIEW COMPARISON:  None. FINDINGS: There is no evidence of fracture or dislocation. Degenerative changes identified at the [facility] joint and glenohumeral joint. Remote healed right rib fractures. Soft tissues are unremarkable. IMPRESSION: 1. No acute findings. 2. Degenerative joint disease. Electronically Signed   By: Kerby Moors M.D.   On: 07/06/2021 07:27   DG Knee 2 Views Left  Result Date: 07/06/2021 CLINICAL DATA:  Fall Altered mental status EXAM: LEFT KNEE - 1-2 VIEW COMPARISON:  None. FINDINGS: Chondrocalcinosis noted in the medial and lateral compartments. Mild tricompartmental spurring is present. No fracture or dislocation IMPRESSION: No acute fracture or dislocation of the left knee. Electronically Signed   By: Miachel Roux M.D.   On: 07/06/2021 07:28   DG Knee 2 Views Right  Result Date: 07/06/2021 CLINICAL DATA:  Fall Altered mental status EXAM: RIGHT KNEE - 1-2 VIEW COMPARISON:  04/07/2015 FINDINGS: Atherosclerotic changes seen throughout visualized arterial segments. Mild joint space loss and spurring in the patellofemoral compartment. Mild to moderate joint space loss and spurring in the lateral compartment. Chondrocalcinosis and mild spurring in the medial compartment. No acute fracture or dislocation IMPRESSION: No acute abnormality of the right knee. Electronically Signed   By: Miachel Roux M.D.   On: 07/06/2021 07:27   CT Head Wo Contrast  Result Date: 07/06/2021 CLINICAL DATA:  Trauma. Unwitnessed fall at skilled nursing facility. EXAM: CT HEAD WITHOUT CONTRAST CT MAXILLOFACIAL WITHOUT CONTRAST CT CERVICAL SPINE WITHOUT CONTRAST TECHNIQUE: Multidetector CT imaging of the head, cervical spine, and maxillofacial structures were performed using the standard protocol without intravenous contrast. Multiplanar CT image  reconstructions of the cervical spine and maxillofacial structures were also generated. COMPARISON:  10/12/2020 FINDINGS: CT HEAD FINDINGS Brain: No evidence of acute infarction, hemorrhage, hydrocephalus, extra-axial collection or mass lesion/mass effect. Prominence of sulci and ventricles compatible with age related brain atrophy. There is mild diffuse low-attenuation within the subcortical and periventricular white matter compatible with chronic microvascular disease. Vascular: No hyperdense vessel or unexpected calcification. Skull: Normal. Negative for fracture or focal lesion. Other: Right frontal scalp hematoma, image 24/3. CT MAXILLOFACIAL FINDINGS  Osseous: No fracture or mandibular dislocation. No destructive process. Orbits: Negative. No traumatic or inflammatory finding. Sinuses: Postop change from left median antrectomy. Mild mucosal thickening noted within the left maxillary sinus. No sinus fluid levels. Bilateral mastoid air cell opacification Soft tissues: Negative CT CERVICAL SPINE FINDINGS Alignment: Normal. Skull base and vertebrae: Vertebral body heights are well preserved. No acute fractures Soft tissues and spinal canal: No prevertebral fluid or swelling. No visible canal hematoma. Disc levels: Severe multilevel degenerative disc and joint disease. Upper chest: Negative. Other: None IMPRESSION: 1. No acute intracranial abnormality. 2. Chronic small vessel ischemic change and brain atrophy. 3. Right frontal scalp hematoma. 4. No evidence for facial bone fracture. 5. No evidence for cervical spine fracture or dislocation. 6. Severe multilevel cervical degenerative disc and joint disease. Electronically Signed   By: Kerby Moors M.D.   On: 07/06/2021 07:42   CT Cervical Spine Wo Contrast  Result Date: 07/06/2021 CLINICAL DATA:  Trauma. Unwitnessed fall at skilled nursing facility. EXAM: CT HEAD WITHOUT CONTRAST CT MAXILLOFACIAL WITHOUT CONTRAST CT CERVICAL SPINE WITHOUT CONTRAST TECHNIQUE:  Multidetector CT imaging of the head, cervical spine, and maxillofacial structures were performed using the standard protocol without intravenous contrast. Multiplanar CT image reconstructions of the cervical spine and maxillofacial structures were also generated. COMPARISON:  10/12/2020 FINDINGS: CT HEAD FINDINGS Brain: No evidence of acute infarction, hemorrhage, hydrocephalus, extra-axial collection or mass lesion/mass effect. Prominence of sulci and ventricles compatible with age related brain atrophy. There is mild diffuse low-attenuation within the subcortical and periventricular white matter compatible with chronic microvascular disease. Vascular: No hyperdense vessel or unexpected calcification. Skull: Normal. Negative for fracture or focal lesion. Other: Right frontal scalp hematoma, image 24/3. CT MAXILLOFACIAL FINDINGS Osseous: No fracture or mandibular dislocation. No destructive process. Orbits: Negative. No traumatic or inflammatory finding. Sinuses: Postop change from left median antrectomy. Mild mucosal thickening noted within the left maxillary sinus. No sinus fluid levels. Bilateral mastoid air cell opacification Soft tissues: Negative CT CERVICAL SPINE FINDINGS Alignment: Normal. Skull base and vertebrae: Vertebral body heights are well preserved. No acute fractures Soft tissues and spinal canal: No prevertebral fluid or swelling. No visible canal hematoma. Disc levels: Severe multilevel degenerative disc and joint disease. Upper chest: Negative. Other: None IMPRESSION: 1. No acute intracranial abnormality. 2. Chronic small vessel ischemic change and brain atrophy. 3. Right frontal scalp hematoma. 4. No evidence for facial bone fracture. 5. No evidence for cervical spine fracture or dislocation. 6. Severe multilevel cervical degenerative disc and joint disease. Electronically Signed   By: Kerby Moors M.D.   On: 07/06/2021 07:42   CT Maxillofacial Wo Contrast  Result Date:  07/06/2021 CLINICAL DATA:  Trauma. Unwitnessed fall at skilled nursing facility. EXAM: CT HEAD WITHOUT CONTRAST CT MAXILLOFACIAL WITHOUT CONTRAST CT CERVICAL SPINE WITHOUT CONTRAST TECHNIQUE: Multidetector CT imaging of the head, cervical spine, and maxillofacial structures were performed using the standard protocol without intravenous contrast. Multiplanar CT image reconstructions of the cervical spine and maxillofacial structures were also generated. COMPARISON:  10/12/2020 FINDINGS: CT HEAD FINDINGS Brain: No evidence of acute infarction, hemorrhage, hydrocephalus, extra-axial collection or mass lesion/mass effect. Prominence of sulci and ventricles compatible with age related brain atrophy. There is mild diffuse low-attenuation within the subcortical and periventricular white matter compatible with chronic microvascular disease. Vascular: No hyperdense vessel or unexpected calcification. Skull: Normal. Negative for fracture or focal lesion. Other: Right frontal scalp hematoma, image 24/3. CT MAXILLOFACIAL FINDINGS Osseous: No fracture or mandibular dislocation. No destructive process. Orbits: Negative.  No traumatic or inflammatory finding. Sinuses: Postop change from left median antrectomy. Mild mucosal thickening noted within the left maxillary sinus. No sinus fluid levels. Bilateral mastoid air cell opacification Soft tissues: Negative CT CERVICAL SPINE FINDINGS Alignment: Normal. Skull base and vertebrae: Vertebral body heights are well preserved. No acute fractures Soft tissues and spinal canal: No prevertebral fluid or swelling. No visible canal hematoma. Disc levels: Severe multilevel degenerative disc and joint disease. Upper chest: Negative. Other: None IMPRESSION: 1. No acute intracranial abnormality. 2. Chronic small vessel ischemic change and brain atrophy. 3. Right frontal scalp hematoma. 4. No evidence for facial bone fracture. 5. No evidence for cervical spine fracture or dislocation. 6. Severe  multilevel cervical degenerative disc and joint disease. Electronically Signed   By: Kerby Moors M.D.   On: 07/06/2021 07:42    Procedures .Marland KitchenLaceration Repair  Date/Time: 07/06/2021 9:07 AM Performed by: Davonna Belling, MD Authorized by: Davonna Belling, MD   Consent:    Consent obtained:  Emergent situation Anesthesia:    Anesthesia method:  Local infiltration   Local anesthetic:  Lidocaine 1% WITH epi Laceration details:    Location:  Face   Face location:  Forehead   Length (cm):  3 Pre-procedure details:    Preparation:  Imaging obtained to evaluate for foreign bodies Exploration:    Hemostasis achieved with:  Epinephrine   Imaging outcome: foreign body not noted     Wound exploration: wound explored through full range of motion     Contaminated: no   Treatment:    Area cleansed with:  Saline   Irrigation method:  Syringe   Visualized foreign bodies/material removed: no     Debridement:  Minimal   Scar revision: no   Skin repair:    Repair method:  Sutures   Suture size:  4-0   Wound skin closure material used: vicryl Rapide.   Number of sutures:  9 Approximation:    Approximation:  Close Repair type:    Repair type:  Simple Post-procedure details:    Procedure completion:  Tolerated well, no immediate complications   Medications Ordered in ED Medications  lidocaine-EPINEPHrine (XYLOCAINE W/EPI) 2 %-1:200000 (PF) injection 10 mL (10 mLs Infiltration Given 07/06/21 0841)    ED Course  I have reviewed the triage vital signs and the nursing notes.  Pertinent labs & imaging results that were available during my care of the patient were reviewed by me and considered in my medical decision making (see chart for details).    MDM Rules/Calculators/A&P                           Patient with fall.  Forehead laceration closed.  Imaging reassuring.  Ecchymosis to right shoulder.  Unsure if this was new or old fall however.  No fracture on x-ray.  Imaging  reassuring and knees also with some ecchymosis.  Head and cervical spine CT negative.  Discussed with hospice nurse.  Patient recently went to hospice from palliative care.  Does not want hospitalization.  After discussion with them we feel that she does not need further work-up of the fall just injury coming from it.  Will discharge back to nursing home.  Mental status appears to be improving while she is here but still not quite at baseline yet. Final Clinical Impression(s) / ED Diagnoses Final diagnoses:  Fall, initial encounter  Contusion of right shoulder, initial encounter  Laceration of forehead, initial encounter  Rx / DC Orders ED Discharge Orders     None        Davonna Belling, MD 07/06/21 [phone]

## 2021-07-06 NOTE — Progress Notes (Addendum)
Location:   [facility] Room Number: [phone] Place of Service:  SNF (31) Provider:  Shaka Cardin Otho Darner, NP  [name], MD  Patient Care Team: [name], MD as PCP - General (Internal Medicine) [place] [place] Lorrinda Ramstad X, NP as Nurse Practitioner (Nurse Practitioner)  Extended Emergency Contact Information Primary Emergency Contact: [name] [place] of [place] Phone: [phone] Relation: Son Secondary Emergency Contact: [place] Phone: [phone] Mobile Phone: [phone] Relation: Daughter  Code Status:  DNR Goals of care: Advanced Directive information Advanced Directives 07/06/2021  Does Patient Have a Medical Advance Directive? Yes  Type of Paramedic of [place];Living will;Out of facility DNR (pink MOST or yellow form)  Does patient want to make changes to medical advance directive? No - Patient declined  Copy of [place] in Chart? Yes - validated most recent copy scanned in chart (See row information)  Would patient like information on creating a medical advance directive? -  Pre-existing out of facility DNR order (yellow form or pink MOST form) Yellow form placed in chart (order not valid for inpatient use);Pink MOST form placed in chart (order not valid for inpatient use)     Chief Complaint  Patient presents with  . Hospitalization Follow-up    Follow up for evaluation after ED    HPI:  Pt is a 87 y.o. female seen today for an acute visit for f/u ED eval for right forehead laceration sustained from fall 07/06/21, #9, sutured,  Right shoulder, R+L knee bruises. CT head, cervical spine, maxillofacial showed no acute fractures or intracranial process. X-ray chest, R+L knee, pelvis, R shoulder, no acute fxs or pulmonary process.     Stage 2 pressure ulcer left superior medial buttock/lower sacral area, hydrocolloid dressing intact.              Adult failure to thrive, under hospice service             Frequent falls, due to frailty and unsteady of BLE             COVID infection, declined Paxlovid, appetite has been decreased.              Gait abnormality, w/c for mobility, needs assistance with transfer.             Memory deficit, needs remainders, verbal cues frequently.             Hypothyroidism, takes Levothyroxine 137.22mg qd since 02/27/21, TSH 1.64 04/24/21             Constipation, takes Senokot S II qhs             COPD, takes Allegra 637mqd, DuoNeb bid, prn O2 via [place]             Edema BLE, chronic, minimal BLE, mostly on top of feet,  takes Torsemide             CKD, Bun/creat 23/1.15 eGFR 37 06/04/21             OA pain, prn Tylenol.               TIA ?, Hx of CAD,  takes ASA 8126md.              Elevated LFT off routine Tylenol, RUQ US,[place]1/9/21 US [place] multiple gallstones in the gallbladder, largest 0.8cm.    Past Medical History:  Diagnosis  Date  . Abnormal liver function tests    11/11/15 AST 31, ALT 88, alk phos 96   . Acute kidney failure, unspecified ([place]) 01/24/2012  . Arthralgia 09/26/2014   Multiple joints: knees, shoulders, wrists, spine hips   . Arthritis   . Candidiasis of other urogenital sites 08/10/2012  . Cerebral embolism 05/23/2005  . CHF (congestive heart failure) ([place])   . Cholelithiasis 11/04/2015  . Closed fracture of sacrum and coccyx without mention of spinal cord injury 02/14/2012  . Constipation 05/03/2013  . Contusion of face, scalp, and neck except eye(s) 06/01/2012  . Contusion of wrist 06/01/2012  . COPD (chronic obstructive pulmonary disease) ([place])   . COPD, mild ([place]) 01/20/2013  . Edema 04/20/2012  . H/O: CVA (cerebrovascular accident)   . Hearing loss 09/26/2014  . History of cancer of uterus   . HTN (hypertension), benign   . Hyperglycemia 11/14/2015  . Hypothyroidism   . Insomnia, unspecified 08/10/2012  . Open wound of knee, leg (except thigh), and ankle, without mention  of complication 03/21/6758  . Other and unspecified hyperlipidemia 06/01/2012  . Other disorder of coccyx 01/27/2012  . Pain in joint, ankle and foot 06/14/2012  . Peripheral vascular disease, unspecified ([place]) 01/27/2012  . Personal history of fall 01/27/2012  . Pneumonia, organism unspecified(486) 01/23/2005  . Rheumatic fever 09/26/1933   Age 14 10/01/14 ESR 13, RAF <10   . Seasonal allergies 05/03/2013  . Stasis dermatitis of both legs 09/26/2014  . Unspecified constipation 11/02/2012  . Unspecified hearing loss 02/08/2013  . Unspecified hereditary and idiopathic peripheral neuropathy 01/27/2012  . Urinary frequency 02/24/2014  . Ventricular fibrillation ([place]) 01/24/2012   Past Surgical History:  Procedure Laterality Date  . ABDOMINAL HYSTERECTOMY  1990   for endometrial cancer  . CATARACT EXTRACTION W/ INTRAOCULAR LENS  IMPLANT, BILATERAL    . [place]  . GUM SURGERY  1932  . MASTOIDECTOMY  1920   bilateral  . THYROIDECTOMY  1960  . TONSILLECTOMY  1916    Allergies  Allergen Reactions  . Amoxicillin     Unknown, patient unable to answer questionnaire   . Aspirin Other (See Comments)    On MAR  . Avelox [Moxifloxacin]     Unknown: listed on MAR  . Erythromycin     Unknown: listed on MAR  . Monistat [Miconazole]     Unknown: listed on MAR  . Morphine And Related     Unknown: listed on MAR  . Orange Juice [Orange Oil]     Unknown: listed on MAR    Allergies as of 07/06/2021       Reactions   Amoxicillin    Unknown, patient unable to answer questionnaire    Aspirin Other (See Comments)   On MAR   Avelox [moxifloxacin]    Unknown: listed on MAR   Erythromycin    Unknown: listed on MAR   Monistat [miconazole]    Unknown: listed on MAR   Morphine And Related    Unknown: listed on MAR   Orange Juice [orange Oil]    Unknown: listed on [facility]        Medication List        Accurate as of July 06, 2021 11:59 PM. If you have any questions, ask  your nurse or doctor.          aspirin 81 MG chewable tablet Chew 81 mg by mouth daily.   carboxymethylcellulose 0.5 % Soln Commonly known as: REFRESH  PLUS Place 1 drop into both eyes 2 (two) times daily.   fexofenadine 60 MG tablet Commonly known as: ALLEGRA Take 60 mg by mouth daily.   ipratropium-albuterol 0.5-2.5 (3) MG/3ML Soln Commonly known as: DUONEB Take 3 mLs by nebulization 2 (two) times daily.   levothyroxine 137 MCG tablet Commonly known as: SYNTHROID Take 137 mcg by mouth daily before breakfast.   melatonin 3 MG Tabs tablet Take 3 mg by mouth at bedtime.   OcuSoft Lid Scrub Allergy Pads Place 1 each into both eyes in the morning and at bedtime. Cleanse bilateral lids for excess eye secretions.   potassium chloride SA 20 MEQ tablet Commonly known as: KLOR-CON Take 20 mEq by mouth daily.   sennosides-docusate sodium 8.6-50 MG tablet Commonly known as: SENOKOT-S Take 2 tablets by mouth at bedtime.   torsemide 10 MG tablet Commonly known as: DEMADEX Take 10 mg by mouth daily. MON, WED, FRI   zinc oxide 20 % ointment Apply 1 application topically daily as needed for irritation (To buttocks after every incontinent episode and for redness.).        Review of Systems  Constitutional:  Positive for appetite change and fatigue. Negative for fever.  HENT:  Positive for hearing loss. Negative for congestion and voice change.   Eyes:  Negative for visual disturbance.  Respiratory:  Positive for cough and shortness of breath.        DOE  Cardiovascular:  Positive for leg swelling.  Gastrointestinal:  Negative for abdominal pain and constipation.  Genitourinary:  Positive for frequency. Negative for dysuria and urgency.       Denied dysuria today.   Musculoskeletal:  Positive for arthralgias and gait problem.  Skin:  Positive for wound. Negative for color change.       Left buttock pressure ulcer. Right forehead scalp laceration, intact #9 suture closure,  no s/s of infection. Bruise noted right shoulder, R+L knee.   Neurological:  Negative for speech difficulty, weakness and light-headedness.       Memory lapses.   Psychiatric/Behavioral:  Negative for behavioral problems and sleep disturbance. The patient is not nervous/anxious.    Immunization History  Administered Date(s) Administered  . Influenza Whole 10/19/2012, 09/15/2018  . Influenza, High Dose Seasonal PF 09/14/2019  . Influenza-Unspecified 10/18/2013, 10/17/2014, 09/02/2015, 09/29/2016, 10/03/2017, 09/23/2020  . Moderna Sars-Covid-2 Vaccination 12/15/2019, 01/12/2020, 10/21/2020, 05/12/2021  . PPD Test 01/22/2013, 11/28/2015  . Pneumococcal Conjugate-13 11/18/2017  . Pneumococcal Polysaccharide-23 01/22/2013  . Td 02/21/2009  . Tdap 04/07/2015   Pertinent  Health Maintenance Due  Topic Date Due  . INFLUENZA VACCINE  07/13/2021  . PNA vac Low Risk Adult  Completed   Fall Risk  09/05/2018 09/02/2017 11/14/2015 04/17/2015 09/26/2014  Falls in the past year? Yes No No Yes No  Number falls in past yr: 2 or more - - 1 -  Injury with Fall? Yes - - Yes -  Comment - - - laceration forehead 04/07/15 -  Risk Factor Category  - - - High Fall Risk -  Risk for fall due to : - - Impaired mobility;Impaired vision - -   Functional Status Survey:    Vitals:   07/06/21 1609  BP: 139/90  Pulse: 83  Resp: 18  Temp: (!) 97.1 F (36.2 C)  SpO2: 95%  Weight: 118 lb (53.5 kg)  Height: 5' (1.524 m)   Body mass index is 23.05 kg/m. Physical Exam Vitals and nursing note reviewed.  Constitutional:  Comments: Sleepy.  HENT:     Head: Normocephalic and atraumatic.     Nose: Nose normal. No congestion or rhinorrhea.     Mouth/Throat:     Mouth: Mucous membranes are moist.     Pharynx: No oropharyngeal exudate or posterior oropharyngeal erythema.  Eyes:     Extraocular Movements: Extraocular movements intact.     Pupils: Pupils are equal, round, and reactive to light.   Cardiovascular:     Rate and Rhythm: Normal rate and regular rhythm.     Heart sounds: Murmur heard.  Pulmonary:     Breath sounds: No rales.  Abdominal:     General: Bowel sounds are normal.     Palpations: Abdomen is soft.     Tenderness: There is no abdominal tenderness.  Musculoskeletal:     Cervical back: Normal range of motion and neck supple.     Right lower leg: Edema present.     Left lower leg: Edema present.     Comments: trace  Skin:    General: Skin is warm and dry.     Findings: Bruising present.     Comments: left superior medial buttock pressure ulcer is healed. The right forehead scalp laceration, intact #9 suture closure, no s/s of infection. Bruise noted right shoulder, R+L knee.    Neurological:     General: No focal deficit present.     Mental Status: She is alert. Mental status is at baseline.     Gait: Gait abnormal.     Comments: Oriented to person, place.   Psychiatric:        Mood and Affect: Mood normal.        Behavior: Behavior normal.    Labs reviewed: Recent Labs    10/17/20 0000 02/27/21 0000  NA 140 146  K 4.3 3.6  CL 97* 106  CO2 30* 28*  BUN 33* 46*  CREATININE 1.1 1.2*  CALCIUM 9.4 8.7   Recent Labs    10/20/20 0000 11/14/20 0000 02/27/21 0000  AST 99* 17 299*  ALT 299* 13 299*  ALKPHOS 171* 71 234*  ALBUMIN 3.8 3.2* 3.3*   Recent Labs    10/17/20 0000 02/27/21 0000  WBC 5.0 10.6  NEUTROABS 2,520.00 7,897.00  HGB 14.4 13.8  HCT 44 43  PLT 220 174   Lab Results  Component Value Date   TSH 1.64 04/24/2021   No results found for: HGBA1C No results found for: CHOL, HDL, LDLCALC, LDLDIRECT, TRIG, CHOLHDL  Significant Diagnostic Results in last 30 days:  DG Chest 1 View  Result Date: 07/06/2021 CLINICAL DATA:  Fall Altered mental status EXAM: CHEST  1 VIEW COMPARISON:  01/19/2013 FINDINGS: Heart size within normal limits. No pulmonary vascular congestion. Lungs are clear. IMPRESSION: No acute cardiopulmonary  process. Electronically Signed   By: Miachel Roux M.D.   On: 07/06/2021 07:30   DG Pelvis 1-2 Views  Result Date: 07/06/2021 CLINICAL DATA:  Fall Altered mental status EXAM: PELVIS - 1-2 VIEW COMPARISON:  10/13/2019 FINDINGS: Advanced degenerative changes seen in the lower lumbar spine. Mild degenerative changes of the right sacroiliac joint. Mild degenerative changes of the pubic symphysis. No acute fracture or dislocation. IMPRESSION: No acute abnormality of the pelvis. Electronically Signed   By: Miachel Roux M.D.   On: 07/06/2021 07:26   DG Shoulder Right  Result Date: 07/06/2021 CLINICAL DATA:  Status post fall.  Altered mental status. EXAM: RIGHT SHOULDER - 2+ VIEW COMPARISON:  None. FINDINGS: There is  no evidence of fracture or dislocation. Degenerative changes identified at the [facility] joint and glenohumeral joint. Remote healed right rib fractures. Soft tissues are unremarkable. IMPRESSION: 1. No acute findings. 2. Degenerative joint disease. Electronically Signed   By: Kerby Moors M.D.   On: 07/06/2021 07:27   DG Knee 2 Views Left  Result Date: 07/06/2021 CLINICAL DATA:  Fall Altered mental status EXAM: LEFT KNEE - 1-2 VIEW COMPARISON:  None. FINDINGS: Chondrocalcinosis noted in the medial and lateral compartments. Mild tricompartmental spurring is present. No fracture or dislocation IMPRESSION: No acute fracture or dislocation of the left knee. Electronically Signed   By: Miachel Roux M.D.   On: 07/06/2021 07:28   DG Knee 2 Views Right  Result Date: 07/06/2021 CLINICAL DATA:  Fall Altered mental status EXAM: RIGHT KNEE - 1-2 VIEW COMPARISON:  04/07/2015 FINDINGS: Atherosclerotic changes seen throughout visualized arterial segments. Mild joint space loss and spurring in the patellofemoral compartment. Mild to moderate joint space loss and spurring in the lateral compartment. Chondrocalcinosis and mild spurring in the medial compartment. No acute fracture or dislocation IMPRESSION: No acute  abnormality of the right knee. Electronically Signed   By: Miachel Roux M.D.   On: 07/06/2021 07:27   CT Head Wo Contrast  Result Date: 07/06/2021 CLINICAL DATA:  Trauma. Unwitnessed fall at skilled nursing facility. EXAM: CT HEAD WITHOUT CONTRAST CT MAXILLOFACIAL WITHOUT CONTRAST CT CERVICAL SPINE WITHOUT CONTRAST TECHNIQUE: Multidetector CT imaging of the head, cervical spine, and maxillofacial structures were performed using the standard protocol without intravenous contrast. Multiplanar CT image reconstructions of the cervical spine and maxillofacial structures were also generated. COMPARISON:  10/12/2020 FINDINGS: CT HEAD FINDINGS Brain: No evidence of acute infarction, hemorrhage, hydrocephalus, extra-axial collection or mass lesion/mass effect. Prominence of sulci and ventricles compatible with age related brain atrophy. There is mild diffuse low-attenuation within the subcortical and periventricular white matter compatible with chronic microvascular disease. Vascular: No hyperdense vessel or unexpected calcification. Skull: Normal. Negative for fracture or focal lesion. Other: Right frontal scalp hematoma, image 24/3. CT MAXILLOFACIAL FINDINGS Osseous: No fracture or mandibular dislocation. No destructive process. Orbits: Negative. No traumatic or inflammatory finding. Sinuses: Postop change from left median antrectomy. Mild mucosal thickening noted within the left maxillary sinus. No sinus fluid levels. Bilateral mastoid air cell opacification Soft tissues: Negative CT CERVICAL SPINE FINDINGS Alignment: Normal. Skull base and vertebrae: Vertebral body heights are well preserved. No acute fractures Soft tissues and spinal canal: No prevertebral fluid or swelling. No visible canal hematoma. Disc levels: Severe multilevel degenerative disc and joint disease. Upper chest: Negative. Other: None IMPRESSION: 1. No acute intracranial abnormality. 2. Chronic small vessel ischemic change and brain atrophy. 3.  Right frontal scalp hematoma. 4. No evidence for facial bone fracture. 5. No evidence for cervical spine fracture or dislocation. 6. Severe multilevel cervical degenerative disc and joint disease. Electronically Signed   By: Kerby Moors M.D.   On: 07/06/2021 07:42   CT Cervical Spine Wo Contrast  Result Date: 07/06/2021 CLINICAL DATA:  Trauma. Unwitnessed fall at skilled nursing facility. EXAM: CT HEAD WITHOUT CONTRAST CT MAXILLOFACIAL WITHOUT CONTRAST CT CERVICAL SPINE WITHOUT CONTRAST TECHNIQUE: Multidetector CT imaging of the head, cervical spine, and maxillofacial structures were performed using the standard protocol without intravenous contrast. Multiplanar CT image reconstructions of the cervical spine and maxillofacial structures were also generated. COMPARISON:  10/12/2020 FINDINGS: CT HEAD FINDINGS Brain: No evidence of acute infarction, hemorrhage, hydrocephalus, extra-axial collection or mass lesion/mass effect. Prominence of sulci and ventricles  compatible with age related brain atrophy. There is mild diffuse low-attenuation within the subcortical and periventricular white matter compatible with chronic microvascular disease. Vascular: No hyperdense vessel or unexpected calcification. Skull: Normal. Negative for fracture or focal lesion. Other: Right frontal scalp hematoma, image 24/3. CT MAXILLOFACIAL FINDINGS Osseous: No fracture or mandibular dislocation. No destructive process. Orbits: Negative. No traumatic or inflammatory finding. Sinuses: Postop change from left median antrectomy. Mild mucosal thickening noted within the left maxillary sinus. No sinus fluid levels. Bilateral mastoid air cell opacification Soft tissues: Negative CT CERVICAL SPINE FINDINGS Alignment: Normal. Skull base and vertebrae: Vertebral body heights are well preserved. No acute fractures Soft tissues and spinal canal: No prevertebral fluid or swelling. No visible canal hematoma. Disc levels: Severe multilevel  degenerative disc and joint disease. Upper chest: Negative. Other: None IMPRESSION: 1. No acute intracranial abnormality. 2. Chronic small vessel ischemic change and brain atrophy. 3. Right frontal scalp hematoma. 4. No evidence for facial bone fracture. 5. No evidence for cervical spine fracture or dislocation. 6. Severe multilevel cervical degenerative disc and joint disease. Electronically Signed   By: Kerby Moors M.D.   On: 07/06/2021 07:42   CT Maxillofacial Wo Contrast  Result Date: 07/06/2021 CLINICAL DATA:  Trauma. Unwitnessed fall at skilled nursing facility. EXAM: CT HEAD WITHOUT CONTRAST CT MAXILLOFACIAL WITHOUT CONTRAST CT CERVICAL SPINE WITHOUT CONTRAST TECHNIQUE: Multidetector CT imaging of the head, cervical spine, and maxillofacial structures were performed using the standard protocol without intravenous contrast. Multiplanar CT image reconstructions of the cervical spine and maxillofacial structures were also generated. COMPARISON:  10/12/2020 FINDINGS: CT HEAD FINDINGS Brain: No evidence of acute infarction, hemorrhage, hydrocephalus, extra-axial collection or mass lesion/mass effect. Prominence of sulci and ventricles compatible with age related brain atrophy. There is mild diffuse low-attenuation within the subcortical and periventricular white matter compatible with chronic microvascular disease. Vascular: No hyperdense vessel or unexpected calcification. Skull: Normal. Negative for fracture or focal lesion. Other: Right frontal scalp hematoma, image 24/3. CT MAXILLOFACIAL FINDINGS Osseous: No fracture or mandibular dislocation. No destructive process. Orbits: Negative. No traumatic or inflammatory finding. Sinuses: Postop change from left median antrectomy. Mild mucosal thickening noted within the left maxillary sinus. No sinus fluid levels. Bilateral mastoid air cell opacification Soft tissues: Negative CT CERVICAL SPINE FINDINGS Alignment: Normal. Skull base and vertebrae: Vertebral  body heights are well preserved. No acute fractures Soft tissues and spinal canal: No prevertebral fluid or swelling. No visible canal hematoma. Disc levels: Severe multilevel degenerative disc and joint disease. Upper chest: Negative. Other: None IMPRESSION: 1. No acute intracranial abnormality. 2. Chronic small vessel ischemic change and brain atrophy. 3. Right frontal scalp hematoma. 4. No evidence for facial bone fracture. 5. No evidence for cervical spine fracture or dislocation. 6. Severe multilevel cervical degenerative disc and joint disease. Electronically Signed   By: Kerby Moors M.D.   On: 07/06/2021 07:42    Assessment/Plan Laceration of forehead, right, complicated, subsequent encounter f/u ED eval for right forehead laceration sustained from fall 07/06/21, #9, sutured,  Right shoulder, R+L knee bruises. CT head, cervical spine, maxillofacial showed no acute fractures or intracranial process. X-ray chest, R+L knee, pelvis, R shoulder, no acute fxs or pulmonary process.   continue supportive, comfort care.   Frequent falls Frailty and poor safety awareness are contributory, close supervision and assistance for safety.   Pressure ulcer of left buttock, stage 2 (HCC) Stage 2 pressure ulcer left superior medial buttock/lower sacral area, hydrocolloid dressing intact.  Adult failure to thrive Adult  failure to thrive, under hospice service  COVID-19 virus infection COVID infection, declined Paxlovid, appetite has been decreased.   Gait instability Gait abnormality, w/c for mobility, needs assistance with transfer.  Memory deficits Memory deficit, needs remainders, verbal cues frequently.  Hypothyroidism Hypothyroidism, takes Levothyroxine 137.73mg qd since 02/27/21, TSH 1.64 04/24/21  Constipation Stable, takes Senokot S II qhs  COPD, mild (HCC) Stable, takes Allegra 641mqd, DuoNeb bid, prn O2 via [place]  Edema of both lower legs due to peripheral venous insufficiency Edema  BLE, chronic, minimal BLE, mostly on top of feet,  takes Torsemide  Chronic kidney disease (CKD), stage III (moderate) Bun/creat 23/1.15 eGFR 37 06/04/21  Arthralgia         OA pain, prn Tylenol.    TIA (transient ischemic attack) TIA ?, Hx of CAD,  takes ASA 8137md.   Cholelithiasis Elevated LFT off routine Tylenol, RUQ US,[place]/21 [place] multiple gallstones in the gallbladder, largest 0.8cm.     Family/ staff Communication: plan of care reviewed with the patient and charge nurse.   Labs/tests ordered:  none  Time spend 25 minutes.

## 2021-07-06 NOTE — Discharge Instructions (Addendum)
The wound on the forehead has been closed with sutures.  The sutures can be removed in around 5 to 7 days, although they are also absorbable sutures.

## 2021-07-06 NOTE — ED Provider Notes (Signed)
Emergency Medicine Provider Triage Evaluation Note  CHERILYNN SCHOMBURG , a 87 y.o. female  was evaluated in triage.  Pt complains of fall at her facility.  She is nonverbal and unable to give a history..  Review of Systems  Positive: Unable Negative: Unable  Physical Exam  BP 123/70 (BP Location: Left Arm)   Pulse 90   Temp 97.6 F (36.4 C) (Oral)   Resp (!) 23   Ht 5' (1.524 m)   Wt 45.4 kg   SpO2 94%   BMI 19.53 kg/m  Gen:   Somnolent, does not speak.  Grimaces to palpation of her right shoulder and bilateral knees. Hematoma and laceration to right forehead Resp:  Normal effort  MSK:   Doesn't follow commands. Grimaces to palpation of R shoulder, bilateral knees Other:  Abrasion right knee  Medical Decision Making  Medically screening exam initiated at 6:53 AM.  Appropriate orders placed.  LEIANN SPORER was informed that the remainder of the evaluation will be completed by another provider, this initial triage assessment does not replace that evaluation, and the importance of remaining in the ED until their evaluation is complete.  In hospice per Epic records.   Glynn Octave, MD 07/06/21 [phone]

## 2021-07-07 ENCOUNTER — Encounter: Payer: Self-pay | Admitting: Nurse Practitioner

## 2021-07-07 DIAGNOSIS — S0181XD Laceration without foreign body of other part of head, subsequent encounter: Secondary | ICD-10-CM | POA: Insufficient documentation

## 2021-07-07 NOTE — Assessment & Plan Note (Signed)
COVID infection, declined Paxlovid, appetite has been decreased.  

## 2021-07-07 NOTE — Assessment & Plan Note (Signed)
f/u ED eval for right forehead laceration sustained from fall 07/06/21, #9, sutured,  Right shoulder, R+L knee bruises. CT head, cervical spine, maxillofacial showed no acute fractures or intracranial process. X-ray chest, R+L knee, pelvis, R shoulder, no acute fxs or pulmonary process.   continue supportive, comfort care.

## 2021-07-07 NOTE — Assessment & Plan Note (Signed)
Edema BLE, chronic, minimal BLE, mostly on top of feet,  takes Torsemide

## 2021-07-07 NOTE — Assessment & Plan Note (Signed)
OA pain, prn Tylenol.

## 2021-07-07 NOTE — Assessment & Plan Note (Signed)
Stable, takes Allegra 60mg  qd, DuoNeb bid, prn O2 via [place]

## 2021-07-07 NOTE — Assessment & Plan Note (Signed)
Adult failure to thrive, under hospice service

## 2021-07-07 NOTE — Assessment & Plan Note (Addendum)
Stage 2 pressure ulcer left superior medial buttock/lower sacral area, healed.

## 2021-07-07 NOTE — Assessment & Plan Note (Signed)
Gait abnormality, w/c for mobility, needs assistance with transfer. 

## 2021-07-07 NOTE — Assessment & Plan Note (Signed)
Memory deficit, needs remainders, verbal cues frequently.  

## 2021-07-07 NOTE — Assessment & Plan Note (Signed)
Elevated LFT off routine Tylenol, RUQ US, 10/21/20 US abd multiple gallstones in the gallbladder, largest 0.8cm.    

## 2021-07-07 NOTE — Assessment & Plan Note (Addendum)
Stable,  takes Senokot S II qhs °

## 2021-07-07 NOTE — Assessment & Plan Note (Signed)
Bun/creat 23/1.15 eGFR 37 06/04/21 

## 2021-07-07 NOTE — Assessment & Plan Note (Signed)
Frailty and poor safety awareness are contributory, close supervision and assistance for safety.

## 2021-07-07 NOTE — Assessment & Plan Note (Signed)
TIA ?, Hx of CAD, takes ASA 81mg qd. 

## 2021-07-07 NOTE — Assessment & Plan Note (Signed)
Hypothyroidism, takes Levothyroxine 137. qd since 02/27/21, TSH 1.64 04/24/21

## 2021-07-14 ENCOUNTER — Non-Acute Institutional Stay (SKILLED_NURSING_FACILITY): Admitting: Internal Medicine

## 2021-07-14 ENCOUNTER — Encounter: Payer: Self-pay | Admitting: Internal Medicine

## 2021-07-14 DIAGNOSIS — E890 Postprocedural hypothyroidism: Secondary | ICD-10-CM

## 2021-07-14 DIAGNOSIS — R600 Localized edema: Secondary | ICD-10-CM | POA: Diagnosis not present

## 2021-07-14 DIAGNOSIS — I872 Venous insufficiency (chronic) (peripheral): Secondary | ICD-10-CM

## 2021-07-14 DIAGNOSIS — R296 Repeated falls: Secondary | ICD-10-CM | POA: Diagnosis not present

## 2021-07-14 DIAGNOSIS — R7989 Other specified abnormal findings of blood chemistry: Secondary | ICD-10-CM

## 2021-07-14 DIAGNOSIS — J449 Chronic obstructive pulmonary disease, unspecified: Secondary | ICD-10-CM

## 2021-07-14 DIAGNOSIS — G459 Transient cerebral ischemic attack, unspecified: Secondary | ICD-10-CM

## 2021-07-14 NOTE — Progress Notes (Signed)
Location:   [place] Room Number: 28 Place of Service:  SNF [phone]) Provider:  Veleta Miners MD   [name], MD  Patient Care Team: [name], MD as PCP - General (Internal Medicine) [place], [place], [facility]ast, Man X, NP as Nurse Practitioner (Nurse Practitioner)  Extended Emergency Contact Information Primary Emergency Contact: [name] [place] Phone: [phone] Relation: Son Secondary Emergency Contact: [place] Phone: [phone] Mobile Phone: [phone] Relation: Daughter  Code Status:  DNR Hospice Managed Care Goals of care: Advanced Directive information Advanced Directives 07/14/2021  Does Patient Have a Medical Advance Directive? Yes  Type of Paramedic of [place];Living will;Out of facility DNR (pink MOST or yellow form)  Does patient want to make changes to medical advance directive? No - Patient declined  Copy of [place] in Chart? Yes - validated most recent copy scanned in chart (See row information)  Would patient like information on creating a medical advance directive? -  Pre-existing out of facility DNR order (yellow form or pink MOST form) Yellow form placed in chart (order not valid for inpatient use);Pink MOST form placed in chart (order not valid for inpatient use)     Chief Complaint  Patient presents with   Medical Management of Chronic Issues   Health Maintenance    Shingrix, Influenza     HPI:  Pt is a 87 y.o. female seen today for medical management of chronic diseases.    Patient has h/o Bilateral LE edema , Hypertension, Hard of hearing, h/o Cellulitis, Hypothyroidism, COPD, Chronic Conjunctivitis  has h/o Gall stones with Hepatitis    Unable to get much history due to very HOH  Has lost almost 8 lbs in last few months ago. Did get Covid Diagnosis in between but was asymptomatic Also had  recent fall with Laceration needing visit to ED Pressure wound to Right Heel  No Nursing issues Enrolled in Hospice   Past Medical History:  Diagnosis Date   Abnormal liver function tests    11/11/15 AST 31, ALT 88, alk phos 96    Acute kidney failure, unspecified (HCC) 01/24/2012   Arthralgia 09/26/2014   Multiple joints: knees, shoulders, wrists, spine hips    Arthritis    Candidiasis of other urogenital sites 08/10/2012   Cerebral embolism 05/23/2005   CHF (congestive heart failure) ([place])    Cholelithiasis 11/04/2015   Closed fracture of sacrum and coccyx without mention of spinal cord injury 02/14/2012   Constipation 05/03/2013   Contusion of face, scalp, and neck except eye(s) 06/01/2012   Contusion of wrist 06/01/2012   COPD (chronic obstructive pulmonary disease) (HCC)    COPD, mild ([place]) 01/20/2013   Edema 04/20/2012   H/O: CVA (cerebrovascular accident)    Hearing loss 09/26/2014   History of cancer of uterus    HTN (hypertension), benign    Hyperglycemia 11/14/2015   Hypothyroidism    Insomnia, unspecified 08/10/2012   Open wound of knee, leg (except thigh), and ankle, without mention of complication 07/21/3809   Other and unspecified hyperlipidemia 06/01/2012   Other disorder of coccyx 01/27/2012   Pain in joint, ankle and foot 06/14/2012   Peripheral vascular disease, unspecified (Home) 01/27/2012   Personal history of fall 01/27/2012   Pneumonia, organism unspecified([place]) 01/23/2005   Rheumatic fever 09/26/1933   Age 60 10/01/14 ESR 13, RAF <10    Seasonal allergies 05/03/2013   Stasis dermatitis of  both legs 09/26/2014   Unspecified constipation 11/02/2012   Unspecified hearing loss 02/08/2013   Unspecified hereditary and idiopathic peripheral neuropathy 01/27/2012   Urinary frequency 02/24/2014   Ventricular fibrillation ([place]) 01/24/2012   Past Surgical History:  Procedure Laterality Date   ABDOMINAL HYSTERECTOMY  1990   for endometrial cancer   CATARACT EXTRACTION W/  INTRAOCULAR LENS  IMPLANT, BILATERAL     ECTOPIC PREGNANCY SURGERY  1952   GUM SURGERY  1932   MASTOIDECTOMY  1920   bilateral   THYROIDECTOMY  1960   TONSILLECTOMY  1916    Allergies  Allergen Reactions   Amoxicillin     Unknown, patient unable to answer questionnaire    Aspirin Other (See Comments)    On MAR   Avelox [Moxifloxacin]     Unknown: listed on MAR   Erythromycin     Unknown: listed on MAR   Monistat [Miconazole]     Unknown: listed on MAR   Morphine And Related     Unknown: listed on MAR   Orange Juice [Orange Oil]     Unknown: listed on MAR    Allergies as of 07/14/2021       Reactions   Amoxicillin    Unknown, patient unable to answer questionnaire    Aspirin Other (See Comments)   On MAR   Avelox [moxifloxacin]    Unknown: listed on MAR   Erythromycin    Unknown: listed on MAR   Monistat [miconazole]    Unknown: listed on MAR   Morphine And Related    Unknown: listed on MAR   Orange Juice [orange Oil]    Unknown: listed on [facility]        Medication List        Accurate as of July 14, 2021 10:45 AM. If you have any questions, ask your nurse or doctor.          aspirin 81 MG chewable tablet Chew 81 mg by mouth daily.   carboxymethylcellulose 0.5 % Soln Commonly known as: REFRESH PLUS Place 1 drop into both eyes 2 (two) times daily.   fexofenadine 60 MG tablet Commonly known as: ALLEGRA Take 60 mg by mouth daily.   ipratropium-albuterol 0.5-2.5 (3) MG/3ML Soln Commonly known as: DUONEB Take 3 mLs by nebulization 2 (two) times daily.   levothyroxine 137 MCG tablet Commonly known as: SYNTHROID Take 137 mcg by mouth daily before breakfast.   melatonin 3 MG Tabs tablet Take 3 mg by mouth at bedtime.   OcuSoft Lid Scrub Allergy Pads Place 1 each into both eyes in the morning and at bedtime. Cleanse bilateral lids for excess eye secretions.   potassium chloride 10 MEQ tablet Commonly known as: KLOR-CON Take 10 mEq by mouth. Mon,  Wed, Fri   sennosides-docusate sodium 8.6-50 MG tablet Commonly known as: SENOKOT-S Take 2 tablets by mouth at bedtime.   torsemide 10 MG tablet Commonly known as: DEMADEX Take 10 mg by mouth daily. MON, WED, FRI   zinc oxide 20 % ointment Apply 1 application topically daily as needed for irritation (To buttocks after every incontinent episode and for redness.).        Review of Systems  Unable to perform ROS: Other   Immunization History  Administered Date(s) Administered   Influenza Whole 10/19/2012, 09/15/2018   Influenza, High Dose Seasonal PF 09/14/2019   Influenza-Unspecified 10/18/2013, 10/17/2014, 09/02/2015, 09/29/2016, 10/03/2017, 09/23/2020   Moderna Sars-Covid-2 Vaccination 12/15/2019, 01/12/2020, 10/21/2020, 05/12/2021   PPD Test 01/22/2013, 11/28/2015  Pneumococcal Conjugate-13 11/18/2017   Pneumococcal Polysaccharide-23 01/22/2013   Td 02/21/2009   Tdap 04/07/2015   Pertinent  Health Maintenance Due  Topic Date Due   INFLUENZA VACCINE  07/13/2021   PNA vac Low Risk Adult  Completed   Fall Risk  09/05/2018 09/02/2017 11/14/2015 04/17/2015 09/26/2014  Falls in the past year? Yes No No Yes No  Number falls in past yr: 2 or more - - 1 -  Injury with Fall? Yes - - Yes -  Comment - - - laceration forehead 04/07/15 -  Risk Factor Category  - - - High Fall Risk -  Risk for fall due to : - - Impaired mobility;Impaired vision - -   Functional Status Survey:    Vitals:   07/14/21 1033  BP: (!) 142/69  Pulse: 75  Resp: 18  Temp: (!) 97.1 F (36.2 C)  SpO2: 97%  Weight: 122 lb 3.2 oz (55.4 kg)  Height: 5' (1.524 m)   Body mass index is 23.87 kg/m. Physical Exam Vitals reviewed.  Constitutional:      Appearance: Normal appearance.  HENT:     Head: Normocephalic.     Nose: Nose normal.     Mouth/Throat:     Mouth: Mucous membranes are moist.     Pharynx: Oropharynx is clear.  Eyes:     Pupils: Pupils are equal, round, and reactive to light.   Cardiovascular:     Rate and Rhythm: Normal rate and regular rhythm.     Pulses: Normal pulses.  Pulmonary:     Effort: Pulmonary effort is normal.     Breath sounds: No wheezing.  Abdominal:     General: Abdomen is flat. Bowel sounds are normal.     Palpations: Abdomen is soft.  Musculoskeletal:        General: Swelling present.     Cervical back: Neck supple.  Skin:    General: Skin is warm.  Neurological:     General: No focal deficit present.     Mental Status: She is alert.  Psychiatric:        Mood and Affect: Mood normal.    Labs reviewed: Recent Labs    10/17/20 0000 02/27/21 0000 06/04/21 0000  NA 140 146 136*  K 4.3 3.6 3.8  CL 97* 106 95*  CO2 30* 28* 31*  BUN 33* 46* 23*  CREATININE 1.1 1.2* 1.2*  CALCIUM 9.4 8.7 8.7   Recent Labs    11/14/20 0000 02/27/21 0000 06/04/21 0000  AST 17 299* 16  ALT 13 299* 17  ALKPHOS 71 234* 100  ALBUMIN 3.2* 3.3* 3.2*   Recent Labs    10/17/20 0000 02/27/21 0000  WBC 5.0 10.6  NEUTROABS 2,520.00 7,897.00  HGB 14.4 13.8  HCT 44 43  PLT 220 174   Lab Results  Component Value Date   TSH 1.64 04/24/2021   No results found for: HGBA1C No results found for: CHOL, HDL, LDLCALC, LDLDIRECT, TRIG, CHOLHDL  Significant Diagnostic Results in last 30 days:  DG Chest 1 View  Result Date: 07/06/2021 CLINICAL DATA:  Fall Altered mental status EXAM: CHEST  1 VIEW COMPARISON:  01/19/2013 FINDINGS: Heart size within normal limits. No pulmonary vascular congestion. Lungs are clear. IMPRESSION: No acute cardiopulmonary process. Electronically Signed   By: Miachel Roux M.D.   On: 07/06/2021 07:30   DG Pelvis 1-2 Views  Result Date: 07/06/2021 CLINICAL DATA:  Fall Altered mental status EXAM: PELVIS - 1-2 VIEW COMPARISON:  10/13/2019  FINDINGS: Advanced degenerative changes seen in the lower lumbar spine. Mild degenerative changes of the right sacroiliac joint. Mild degenerative changes of the pubic symphysis. No acute  fracture or dislocation. IMPRESSION: No acute abnormality of the pelvis. Electronically Signed   By: Miachel Roux M.D.   On: 07/06/2021 07:26   DG Shoulder Right  Result Date: 07/06/2021 CLINICAL DATA:  Status post fall.  Altered mental status. EXAM: RIGHT SHOULDER - 2+ VIEW COMPARISON:  None. FINDINGS: There is no evidence of fracture or dislocation. Degenerative changes identified at the [facility] joint and glenohumeral joint. Remote healed right rib fractures. Soft tissues are unremarkable. IMPRESSION: 1. No acute findings. 2. Degenerative joint disease. Electronically Signed   By: Kerby Moors M.D.   On: 07/06/2021 07:27   DG Knee 2 Views Left  Result Date: 07/06/2021 CLINICAL DATA:  Fall Altered mental status EXAM: LEFT KNEE - 1-2 VIEW COMPARISON:  None. FINDINGS: Chondrocalcinosis noted in the medial and lateral compartments. Mild tricompartmental spurring is present. No fracture or dislocation IMPRESSION: No acute fracture or dislocation of the left knee. Electronically Signed   By: Miachel Roux M.D.   On: 07/06/2021 07:28   DG Knee 2 Views Right  Result Date: 07/06/2021 CLINICAL DATA:  Fall Altered mental status EXAM: RIGHT KNEE - 1-2 VIEW COMPARISON:  04/07/2015 FINDINGS: Atherosclerotic changes seen throughout visualized arterial segments. Mild joint space loss and spurring in the patellofemoral compartment. Mild to moderate joint space loss and spurring in the lateral compartment. Chondrocalcinosis and mild spurring in the medial compartment. No acute fracture or dislocation IMPRESSION: No acute abnormality of the right knee. Electronically Signed   By: Miachel Roux M.D.   On: 07/06/2021 07:27   CT Head Wo Contrast  Result Date: 07/06/2021 CLINICAL DATA:  Trauma. Unwitnessed fall at skilled nursing facility. EXAM: CT HEAD WITHOUT CONTRAST CT MAXILLOFACIAL WITHOUT CONTRAST CT CERVICAL SPINE WITHOUT CONTRAST TECHNIQUE: Multidetector CT imaging of the head, cervical spine, and maxillofacial  structures were performed using the standard protocol without intravenous contrast. Multiplanar CT image reconstructions of the cervical spine and maxillofacial structures were also generated. COMPARISON:  10/12/2020 FINDINGS: CT HEAD FINDINGS Brain: No evidence of acute infarction, hemorrhage, hydrocephalus, extra-axial collection or mass lesion/mass effect. Prominence of sulci and ventricles compatible with age related brain atrophy. There is mild diffuse low-attenuation within the subcortical and periventricular white matter compatible with chronic microvascular disease. Vascular: No hyperdense vessel or unexpected calcification. Skull: Normal. Negative for fracture or focal lesion. Other: Right frontal scalp hematoma, image 24/3. CT MAXILLOFACIAL FINDINGS Osseous: No fracture or mandibular dislocation. No destructive process. Orbits: Negative. No traumatic or inflammatory finding. Sinuses: Postop change from left median antrectomy. Mild mucosal thickening noted within the left maxillary sinus. No sinus fluid levels. Bilateral mastoid air cell opacification Soft tissues: Negative CT CERVICAL SPINE FINDINGS Alignment: Normal. Skull base and vertebrae: Vertebral body heights are well preserved. No acute fractures Soft tissues and spinal canal: No prevertebral fluid or swelling. No visible canal hematoma. Disc levels: Severe multilevel degenerative disc and joint disease. Upper chest: Negative. Other: None IMPRESSION: 1. No acute intracranial abnormality. 2. Chronic small vessel ischemic change and brain atrophy. 3. Right frontal scalp hematoma. 4. No evidence for facial bone fracture. 5. No evidence for cervical spine fracture or dislocation. 6. Severe multilevel cervical degenerative disc and joint disease. Electronically Signed   By: Kerby Moors M.D.   On: 07/06/2021 07:42   CT Cervical Spine Wo Contrast  Result Date: 07/06/2021 CLINICAL DATA:  Trauma. Unwitnessed fall at skilled nursing facility. EXAM: CT  HEAD WITHOUT CONTRAST CT MAXILLOFACIAL WITHOUT CONTRAST CT CERVICAL SPINE WITHOUT CONTRAST TECHNIQUE: Multidetector CT imaging of the head, cervical spine, and maxillofacial structures were performed using the standard protocol without intravenous contrast. Multiplanar CT image reconstructions of the cervical spine and maxillofacial structures were also generated. COMPARISON:  10/12/2020 FINDINGS: CT HEAD FINDINGS Brain: No evidence of acute infarction, hemorrhage, hydrocephalus, extra-axial collection or mass lesion/mass effect. Prominence of sulci and ventricles compatible with age related brain atrophy. There is mild diffuse low-attenuation within the subcortical and periventricular white matter compatible with chronic microvascular disease. Vascular: No hyperdense vessel or unexpected calcification. Skull: Normal. Negative for fracture or focal lesion. Other: Right frontal scalp hematoma, image 24/3. CT MAXILLOFACIAL FINDINGS Osseous: No fracture or mandibular dislocation. No destructive process. Orbits: Negative. No traumatic or inflammatory finding. Sinuses: Postop change from left median antrectomy. Mild mucosal thickening noted within the left maxillary sinus. No sinus fluid levels. Bilateral mastoid air cell opacification Soft tissues: Negative CT CERVICAL SPINE FINDINGS Alignment: Normal. Skull base and vertebrae: Vertebral body heights are well preserved. No acute fractures Soft tissues and spinal canal: No prevertebral fluid or swelling. No visible canal hematoma. Disc levels: Severe multilevel degenerative disc and joint disease. Upper chest: Negative. Other: None IMPRESSION: 1. No acute intracranial abnormality. 2. Chronic small vessel ischemic change and brain atrophy. 3. Right frontal scalp hematoma. 4. No evidence for facial bone fracture. 5. No evidence for cervical spine fracture or dislocation. 6. Severe multilevel cervical degenerative disc and joint disease. Electronically Signed   By: Kerby Moors M.D.   On: 07/06/2021 07:42   CT Maxillofacial Wo Contrast  Result Date: 07/06/2021 CLINICAL DATA:  Trauma. Unwitnessed fall at skilled nursing facility. EXAM: CT HEAD WITHOUT CONTRAST CT MAXILLOFACIAL WITHOUT CONTRAST CT CERVICAL SPINE WITHOUT CONTRAST TECHNIQUE: Multidetector CT imaging of the head, cervical spine, and maxillofacial structures were performed using the standard protocol without intravenous contrast. Multiplanar CT image reconstructions of the cervical spine and maxillofacial structures were also generated. COMPARISON:  10/12/2020 FINDINGS: CT HEAD FINDINGS Brain: No evidence of acute infarction, hemorrhage, hydrocephalus, extra-axial collection or mass lesion/mass effect. Prominence of sulci and ventricles compatible with age related brain atrophy. There is mild diffuse low-attenuation within the subcortical and periventricular white matter compatible with chronic microvascular disease. Vascular: No hyperdense vessel or unexpected calcification. Skull: Normal. Negative for fracture or focal lesion. Other: Right frontal scalp hematoma, image 24/3. CT MAXILLOFACIAL FINDINGS Osseous: No fracture or mandibular dislocation. No destructive process. Orbits: Negative. No traumatic or inflammatory finding. Sinuses: Postop change from left median antrectomy. Mild mucosal thickening noted within the left maxillary sinus. No sinus fluid levels. Bilateral mastoid air cell opacification Soft tissues: Negative CT CERVICAL SPINE FINDINGS Alignment: Normal. Skull base and vertebrae: Vertebral body heights are well preserved. No acute fractures Soft tissues and spinal canal: No prevertebral fluid or swelling. No visible canal hematoma. Disc levels: Severe multilevel degenerative disc and joint disease. Upper chest: Negative. Other: None IMPRESSION: 1. No acute intracranial abnormality. 2. Chronic small vessel ischemic change and brain atrophy. 3. Right frontal scalp hematoma. 4. No evidence for facial  bone fracture. 5. No evidence for cervical spine fracture or dislocation. 6. Severe multilevel cervical degenerative disc and joint disease. Electronically Signed   By: Kerby Moors M.D.   On: 07/06/2021 07:42    Assessment/Plan  Frequent falls Continue to provide support Postoperative hypothyroidism TSH normal  Dose was reduced in 3/22 COPD, mild ([place])  On Nebs Edema of both lower legs due to peripheral venous insufficiency Low dose of Torsemide Creat stable TIA (transient ischemic attack) On Small aspirin Elevated LFTs Has Gall stones but asymptomatic LFTS have come down  Enrolled in Hospice  Family/ staff Communication:   Labs/tests ordered:

## 2021-08-13 ENCOUNTER — Encounter: Payer: Self-pay | Admitting: Nurse Practitioner

## 2021-08-13 ENCOUNTER — Non-Acute Institutional Stay (SKILLED_NURSING_FACILITY): Payer: Medicare Other | Admitting: Nurse Practitioner

## 2021-08-13 DIAGNOSIS — M2550 Pain in unspecified joint: Secondary | ICD-10-CM

## 2021-08-13 DIAGNOSIS — I872 Venous insufficiency (chronic) (peripheral): Secondary | ICD-10-CM

## 2021-08-13 DIAGNOSIS — R413 Other amnesia: Secondary | ICD-10-CM

## 2021-08-13 DIAGNOSIS — E890 Postprocedural hypothyroidism: Secondary | ICD-10-CM

## 2021-08-13 DIAGNOSIS — R2681 Unsteadiness on feet: Secondary | ICD-10-CM

## 2021-08-13 DIAGNOSIS — G459 Transient cerebral ischemic attack, unspecified: Secondary | ICD-10-CM

## 2021-08-13 DIAGNOSIS — R600 Localized edema: Secondary | ICD-10-CM

## 2021-08-13 DIAGNOSIS — K5900 Constipation, unspecified: Secondary | ICD-10-CM

## 2021-08-13 DIAGNOSIS — N1832 Chronic kidney disease, stage 3b: Secondary | ICD-10-CM

## 2021-08-13 DIAGNOSIS — R296 Repeated falls: Secondary | ICD-10-CM

## 2021-08-13 DIAGNOSIS — R748 Abnormal levels of other serum enzymes: Secondary | ICD-10-CM | POA: Diagnosis not present

## 2021-08-13 DIAGNOSIS — J449 Chronic obstructive pulmonary disease, unspecified: Secondary | ICD-10-CM

## 2021-08-13 DIAGNOSIS — R627 Adult failure to thrive: Secondary | ICD-10-CM

## 2021-08-13 DIAGNOSIS — U071 COVID-19: Secondary | ICD-10-CM

## 2021-08-13 NOTE — Assessment & Plan Note (Signed)
Frequent falls, due to frailty and unsteady of BLE 

## 2021-08-13 NOTE — Assessment & Plan Note (Signed)
Gait abnormality, w/c for mobility, needs assistance with transfer. 

## 2021-08-13 NOTE — Assessment & Plan Note (Signed)
Adult failure to thrive, under hospice service 

## 2021-08-13 NOTE — Assessment & Plan Note (Signed)
needs remainders, verbal cues frequently.   

## 2021-08-13 NOTE — Progress Notes (Signed)
Location:   [place] Room Number: [phone] Place of Service:  SNF (31) Provider:  Judeen Geralds Otho Darner, NP  [name], MD  Patient Care Team: [name], MD as PCP - General (Internal Medicine) [place], [place], [facility] Dia Donate X, NP as Nurse Practitioner (Nurse Practitioner)  Extended Emergency Contact Information Primary Emergency Contact: [name] States of [place] Phone: [phone] Relation: Son Secondary Emergency Contact: [place] Phone: [phone] Mobile Phone: [phone] Relation: Daughter  Code Status:  DNR Goals of care: Advanced Directive information Advanced Directives 08/13/2021  Does Patient Have a Medical Advance Directive? Yes  Type of Paramedic of [place];Living will;Out of facility DNR (pink MOST or yellow form)  Does patient want to make changes to medical advance directive? No - Patient declined  Copy of [place] in Chart? Yes - validated most recent copy scanned in chart (See row information)  Would patient like information on creating a medical advance directive? -  Pre-existing out of facility DNR order (yellow form or pink MOST form) Yellow form placed in chart (order not valid for inpatient use);Pink MOST form placed in chart (order not valid for inpatient use)     Chief Complaint  Patient presents with   Medical Management of Chronic Issues    Routine follow up    Health Maintenance    Discuss shingles vaccine and influenza vaccine    HPI:  Pt is a 87 y.o. female seen today for medical management of chronic diseases.    Adult failure to thrive, under hospice service             Frequent falls, due to frailty and unsteady of BLE             COVID infection, declined Paxlovid, appetite has been decreased.              Gait abnormality, w/c for mobility, needs assistance with transfer.             Memory deficit,  needs remainders, verbal cues frequently.             Hypothyroidism, takes Levothyroxine 137.54mg qd since 02/27/21, TSH 1.64 04/24/21             Constipation, takes Senokot S II qhs             COPD, takes Allegra 657mqd, DuoNeb bid, prn O2 via [place]             Edema BLE, chronic, minimal BLE, mostly on top of feet,  takes Torsemide             CKD, Bun/creat 23/1.15 eGFR 37 06/04/21             OA pain, prn Tylenol.               TIA ?, Hx of CAD,  takes ASA 8160md.              Elevated LFT off routine Tylenol, RUQ [place]1/9/21 [place] multiple gallstones in the gallbladder, largest 0.8cm.     Past Medical History:  Diagnosis Date   Abnormal liver function tests    11/11/15 AST 31, ALT 88, alk phos 96    Acute kidney failure, unspecified ([facility]/10/2012   Arthralgia 09/26/2014   Multiple joints: knees, shoulders, wrists, spine hips    Arthritis    Candidiasis of other urogenital sites 08/10/2012  Cerebral embolism 05/23/2005   CHF (congestive heart failure) ([place])    Cholelithiasis 11/04/2015   Closed fracture of sacrum and coccyx without mention of spinal cord injury 02/14/2012   Constipation 05/03/2013   Contusion of face, scalp, and neck except eye(s) 06/01/2012   Contusion of wrist 06/01/2012   COPD (chronic obstructive pulmonary disease) (HCC)    COPD, mild ([place]) 01/20/2013   Edema 04/20/2012   H/O: CVA (cerebrovascular accident)    Hearing loss 09/26/2014   History of cancer of uterus    HTN (hypertension), benign    Hyperglycemia 11/14/2015   Hypothyroidism    Insomnia, unspecified 08/10/2012   Open wound of knee, leg (except thigh), and ankle, without mention of complication 0/12/6008   Other and unspecified hyperlipidemia 06/01/2012   Other disorder of coccyx 01/27/2012   Pain in joint, ankle and foot 06/14/2012   Peripheral vascular disease, unspecified ([place]) 01/27/2012   Personal history of fall 01/27/2012   Pneumonia, organism unspecified(486) 01/23/2005   Rheumatic fever  09/26/1933   Age 72 10/01/14 ESR 13, RAF <10    Seasonal allergies 05/03/2013   Stasis dermatitis of both legs 09/26/2014   Unspecified constipation 11/02/2012   Unspecified hearing loss 02/08/2013   Unspecified hereditary and idiopathic peripheral neuropathy 01/27/2012   Urinary frequency 02/24/2014   Ventricular fibrillation ([place]) 01/24/2012   Past Surgical History:  Procedure Laterality Date   ABDOMINAL HYSTERECTOMY  1990   for endometrial cancer   CATARACT EXTRACTION W/ INTRAOCULAR LENS  IMPLANT, [place]   bilateral   THYROIDECTOMY  1960   TONSILLECTOMY  1916    Allergies  Allergen Reactions   Amoxicillin     Unknown, patient unable to answer questionnaire    Aspirin Other (See Comments)    On MAR   Avelox [Moxifloxacin]     Unknown: listed on MAR   Erythromycin     Unknown: listed on MAR   Monistat [Miconazole]     Unknown: listed on MAR   Morphine And Related     Unknown: listed on MAR   Orange Juice [Orange Oil]     Unknown: listed on MAR    Allergies as of 08/13/2021       Reactions   Amoxicillin    Unknown, patient unable to answer questionnaire    Aspirin Other (See Comments)   On MAR   Avelox [moxifloxacin]    Unknown: listed on MAR   Erythromycin    Unknown: listed on MAR   Monistat [miconazole]    Unknown: listed on MAR   Morphine And Related    Unknown: listed on MAR   Orange Juice [orange Oil]    Unknown: listed on [facility]        Medication List        Accurate as of August 13, 2021  4:15 PM. If you have any questions, ask your nurse or doctor.          aspirin 81 MG chewable tablet Chew 81 mg by mouth daily.   carboxymethylcellulose 0.5 % Soln Commonly known as: REFRESH PLUS Place 1 drop into both eyes 2 (two) times daily.   fexofenadine 60 MG tablet Commonly known as: ALLEGRA Take 60 mg by mouth daily.   ipratropium-albuterol 0.5-2.5 (3) MG/3ML  Soln Commonly known as: DUONEB Take 3 mLs by nebulization 2 (two) times daily.   levothyroxine 137 MCG tablet Commonly  known as: SYNTHROID Take 137 mcg by mouth daily before breakfast.   melatonin 3 MG Tabs tablet Take 3 mg by mouth at bedtime.   OcuSoft Lid Scrub Allergy Pads Place 1 each into both eyes in the morning and at bedtime. Cleanse bilateral lids for excess eye secretions.   potassium chloride 10 MEQ tablet Commonly known as: KLOR-CON Take 10 mEq by mouth. Mon, Wed, Fri   sennosides-docusate sodium 8.6-50 MG tablet Commonly known as: SENOKOT-S Take 2 tablets by mouth at bedtime.   torsemide 10 MG tablet Commonly known as: DEMADEX Take 10 mg by mouth daily. MON, WED, FRI   zinc oxide 20 % ointment Apply 1 application topically daily as needed for irritation (To buttocks after every incontinent episode and for redness.).        Review of Systems  Constitutional:  Positive for appetite change and fatigue. Negative for fever.  HENT:  Positive for hearing loss. Negative for congestion and voice change.   Eyes:  Negative for visual disturbance.  Respiratory:  Positive for shortness of breath. Negative for cough.        DOE  Cardiovascular:  Positive for leg swelling.  Gastrointestinal:  Negative for abdominal pain and constipation.  Genitourinary:  Positive for frequency. Negative for dysuria and urgency.       Denied dysuria today.   Musculoskeletal:  Positive for arthralgias and gait problem.  Skin:  Positive for wound. Negative for color change.  Neurological:  Negative for speech difficulty, weakness, numbness and headaches.       Memory lapses.   Psychiatric/Behavioral:  Negative for behavioral problems and sleep disturbance. The patient is not nervous/anxious.    Immunization History  Administered Date(s) Administered   Influenza Whole 10/19/2012, 09/15/2018   Influenza, High Dose Seasonal PF 09/14/2019   Influenza-Unspecified 10/18/2013, 10/17/2014,  09/02/2015, 09/29/2016, 10/03/2017, 09/23/2020   Moderna Sars-Covid-2 Vaccination 12/15/2019, 01/12/2020, 10/21/2020, 05/12/2021   PPD Test 01/22/2013, 11/28/2015   Pneumococcal Conjugate-13 11/18/2017   Pneumococcal Polysaccharide-23 01/22/2013   Td 02/21/2009   Tdap 04/07/2015   Pertinent  Health Maintenance Due  Topic Date Due   INFLUENZA VACCINE  07/13/2021   PNA vac Low Risk Adult  Completed   Fall Risk  09/05/2018 09/02/2017 11/14/2015 04/17/2015 09/26/2014  Falls in the past year? Yes No No Yes No  Number falls in past yr: 2 or more - - 1 -  Injury with Fall? Yes - - Yes -  Comment - - - laceration forehead 04/07/15 -  Risk Factor Category  - - - High Fall Risk -  Risk for fall due to : - - Impaired mobility;Impaired vision - -   Functional Status Survey:    Vitals:   08/13/21 0937  BP: (!) 169/79  Pulse: 60  Resp: 18  Temp: (!) 96.3 F (35.7 C)  SpO2: 98%  Weight: 118 lb 4.8 oz (53.7 kg)  Height: 5' (1.524 m)   Body mass index is 23.1 kg/m. Physical Exam Vitals and nursing note reviewed.  Constitutional:      Comments: Sleepy.  HENT:     Head: Normocephalic and atraumatic.     Nose: Nose normal. No congestion or rhinorrhea.     Mouth/Throat:     Mouth: Mucous membranes are moist.     Pharynx: No oropharyngeal exudate or posterior oropharyngeal erythema.  Eyes:     Extraocular Movements: Extraocular movements intact.     Pupils: Pupils are equal, round, and reactive to light.  Cardiovascular:  Rate and Rhythm: Normal rate and regular rhythm.     Heart sounds: Murmur heard.  Pulmonary:     Breath sounds: No rales.  Abdominal:     General: Bowel sounds are normal.     Palpations: Abdomen is soft.     Tenderness: There is no abdominal tenderness.  Musculoskeletal:     Cervical back: Normal range of motion and neck supple.     Right lower leg: Edema present.     Left lower leg: Edema present.     Comments: trace  Skin:    General: Skin is warm and dry.   Neurological:     General: No focal deficit present.     Mental Status: She is alert. Mental status is at baseline.     Gait: Gait abnormal.     Comments: Oriented to person, place.   Psychiatric:        Mood and Affect: Mood normal.        Behavior: Behavior normal.    Labs reviewed: Recent Labs    10/17/20 0000 02/27/21 0000 06/04/21 0000  NA 140 146 136*  K 4.3 3.6 3.8  CL 97* 106 95*  CO2 30* 28* 31*  BUN 33* 46* 23*  CREATININE 1.1 1.2* 1.2*  CALCIUM 9.4 8.7 8.7   Recent Labs    11/14/20 0000 02/27/21 0000 06/04/21 0000  AST 17 299* 16  ALT 13 299* 17  ALKPHOS 71 234* 100  ALBUMIN 3.2* 3.3* 3.2*   Recent Labs    10/17/20 0000 02/27/21 0000  WBC 5.0 10.6  NEUTROABS 2,520.00 7,897.00  HGB 14.4 13.8  HCT 44 43  PLT 220 174   Lab Results  Component Value Date   TSH 1.64 04/24/2021   No results found for: HGBA1C No results found for: CHOL, HDL, LDLCALC, LDLDIRECT, TRIG, CHOLHDL  Significant Diagnostic Results in last 30 days:  No results found.  Assessment/Plan Chronic kidney disease (CKD), stage III (moderate) Bun/creat 23/1.15 eGFR 37 06/04/21  Arthralgia prn Tylenol.    TIA (transient ischemic attack) TIA ?, Hx of CAD,  takes ASA 12m qd.   Elevated liver enzymes Asymptomatic, ellevated LFT off routine Tylenol, RUQ [place] 10/21/20 [place] multiple gallstones in the gallbladder, largest 0.8cm.   Edema of both lower legs due to peripheral venous insufficiency chronic, minimal BLE, mostly on top of feet,  takes Torsemide  COPD, mild (HCC)  takes Allegra 64mqd, DuoNeb bid, prn O2 via [place]  Constipation takes Senokot S II qhs  Hypothyroidism takes Levothyroxine 137.29m3mqd since 02/27/21, TSH 1.64 04/24/21  Memory deficits needs remainders, verbal cues frequently.  Gait instability Gait abnormality, w/c for mobility, needs assistance with transfer.  COVID-19 virus infection COVID infection, declined Paxlovid, appetite has been decreased.    Frequent falls Frequent falls, due to frailty and unsteady of BLE  Adult failure to thrive Adult failure to thrive, under hospice service    Family/ staff Communication: plan of care reviewed with the patient and charge nurse.   Labs/tests ordered:  none  Time spend 25 minutes.

## 2021-08-13 NOTE — Assessment & Plan Note (Signed)
takes Senokot S II qhs

## 2021-08-13 NOTE — Assessment & Plan Note (Signed)
takes Levothyroxine 137.5mcg qd since 02/27/21, TSH 1.64 04/24/21 

## 2021-08-13 NOTE — Assessment & Plan Note (Signed)
prn Tylenol.  

## 2021-08-13 NOTE — Assessment & Plan Note (Signed)
Asymptomatic, ellevated LFT off routine Tylenol, RUQ [place], 10/21/20 [place] abd multiple gallstones in the gallbladder, largest 0.8cm.

## 2021-08-13 NOTE — Assessment & Plan Note (Signed)
Bun/creat 23/1.15 eGFR 37 06/04/21

## 2021-08-13 NOTE — Assessment & Plan Note (Signed)
TIA ?, Hx of CAD, takes ASA 81mg qd. 

## 2021-08-13 NOTE — Assessment & Plan Note (Signed)
COVID infection, declined Paxlovid, appetite has been decreased.  

## 2021-08-13 NOTE — Assessment & Plan Note (Signed)
chronic, minimal BLE, mostly on top of feet,  takes Torsemide °

## 2021-08-13 NOTE — Assessment & Plan Note (Signed)
takes Allegra 60mg qd, DuoNeb bid, prn O2 via [place] 

## 2021-09-14 ENCOUNTER — Non-Acute Institutional Stay (SKILLED_NURSING_FACILITY): Payer: Medicare Other | Admitting: Nurse Practitioner

## 2021-09-14 ENCOUNTER — Encounter: Payer: Self-pay | Admitting: Nurse Practitioner

## 2021-09-14 DIAGNOSIS — K8018 Calculus of gallbladder with other cholecystitis without obstruction: Secondary | ICD-10-CM

## 2021-09-14 DIAGNOSIS — N1832 Chronic kidney disease, stage 3b: Secondary | ICD-10-CM

## 2021-09-14 DIAGNOSIS — K5900 Constipation, unspecified: Secondary | ICD-10-CM

## 2021-09-14 DIAGNOSIS — M159 Polyosteoarthritis, unspecified: Secondary | ICD-10-CM

## 2021-09-14 DIAGNOSIS — I10 Essential (primary) hypertension: Secondary | ICD-10-CM

## 2021-09-14 DIAGNOSIS — E890 Postprocedural hypothyroidism: Secondary | ICD-10-CM

## 2021-09-14 DIAGNOSIS — J449 Chronic obstructive pulmonary disease, unspecified: Secondary | ICD-10-CM

## 2021-09-14 DIAGNOSIS — R627 Adult failure to thrive: Secondary | ICD-10-CM

## 2021-09-14 DIAGNOSIS — I739 Peripheral vascular disease, unspecified: Secondary | ICD-10-CM | POA: Diagnosis not present

## 2021-09-14 DIAGNOSIS — G459 Transient cerebral ischemic attack, unspecified: Secondary | ICD-10-CM

## 2021-09-14 DIAGNOSIS — M150 Primary generalized (osteo)arthritis: Secondary | ICD-10-CM

## 2021-09-14 DIAGNOSIS — R413 Other amnesia: Secondary | ICD-10-CM

## 2021-09-14 DIAGNOSIS — R2681 Unsteadiness on feet: Secondary | ICD-10-CM

## 2021-09-14 NOTE — Assessment & Plan Note (Signed)
Bun/creat 23/1.15 eGFR 37 06/04/21 

## 2021-09-14 NOTE — Assessment & Plan Note (Signed)
TIA ?, Hx of CAD, takes ASA 81mg qd. 

## 2021-09-14 NOTE — Assessment & Plan Note (Signed)
needs remainders, verbal cues frequently.   

## 2021-09-14 NOTE — Assessment & Plan Note (Signed)
takes Levothyroxine 137.5mcg qd since 02/27/21, TSH 1.64 04/24/21 

## 2021-09-14 NOTE — Progress Notes (Signed)
Location:  [facility] Room Number: 28 A Place of Service:  SNF (31) Provider:  Loman [place], NP  [name], MD  Patient Care Team: [name], MD as PCP - General (Internal Medicine) [place], [place], [facility] Tattianna Schnarr X, NP as Nurse Practitioner (Nurse Practitioner)  Extended Emergency Contact Information Primary Emergency Contact: [name] States of [place] Phone: [phone] Relation: Son Secondary Emergency Contact: [place] Phone: [phone] Mobile Phone: [phone] Relation: Daughter  Code Status: DNR   Goals of care: Advanced Directive information Advanced Directives 09/14/2021  Does Patient Have a Medical Advance Directive? Yes  Type of Paramedic of [place];Living will;Out of facility DNR (pink MOST or yellow form)  Does patient want to make changes to medical advance directive? No - Patient declined  Copy of [place] in Chart? Yes - validated most recent copy scanned in chart (See row information)  Would patient like information on creating a medical advance directive? -  Pre-existing out of facility DNR order (yellow form or pink MOST form) Yellow form placed in chart (order not valid for inpatient use);Pink MOST form placed in chart (order not valid for inpatient use)     Chief Complaint  Patient presents with   Medical Management of Chronic Issues    Routine follow up visit.    HPI:  Pt is a 87 y.o. female seen today for medical management of chronic diseases.       Adult failure to thrive, under hospice service, gradual weight loss.              Frequent falls, due to frailty and unsteady of BLE             COVID infection, declined Paxlovid, appetite has been decreased.              Gait abnormality, w/c for mobility, needs assistance with transfer.             Memory deficit, needs remainders, verbal cues  frequently.             Hypothyroidism, takes Levothyroxine 137.18mg qd since 02/27/21, TSH 1.64 04/24/21             Constipation, takes Senokot S II qhs             COPD, takes Allegra 671mqd, DuoNeb bid, prn O2 via [place]             Edema BLE, chronic, minimal BLE, mostly on top of feet,  takes Torsemide             CKD, Bun/creat 23/1.15 eGFR 37 06/04/21             OA pain, prn Tylenol, Tramadol.              TIA ?, Hx of CAD,  takes ASA 8113md.              Elevated LFT off routine Tylenol, RUQ [place]1/9/21 [place] multiple gallstones in the gallbladder, largest 0.8cm.     Past Medical History:  Diagnosis Date   Abnormal liver function tests    11/11/15 AST 31, ALT 88, alk phos 96    Acute kidney failure, unspecified ([facility]/10/2012   Arthralgia 09/26/2014   Multiple joints: knees, shoulders, wrists, spine hips    Arthritis    Candidiasis of other urogenital sites 08/10/2012   Cerebral embolism 05/23/2005  CHF (congestive heart failure) ([place])    Cholelithiasis 11/04/2015   Closed fracture of sacrum and coccyx without mention of spinal cord injury 02/14/2012   Constipation 05/03/2013   Contusion of face, scalp, and neck except eye(s) 06/01/2012   Contusion of wrist 06/01/2012   COPD (chronic obstructive pulmonary disease) ([place])    COPD, mild ([place]) 01/20/2013   Edema 04/20/2012   H/O: CVA (cerebrovascular accident)    Hearing loss 09/26/2014   History of cancer of uterus    HTN (hypertension), benign    Hyperglycemia 11/14/2015   Hypothyroidism    Insomnia, unspecified 08/10/2012   Open wound of knee, leg (except thigh), and ankle, without mention of complication 02/13/75   Other and unspecified hyperlipidemia 06/01/2012   Other disorder of coccyx 01/27/2012   Pain in joint, ankle and foot 06/14/2012   Peripheral vascular disease, unspecified ([place]) 01/27/2012   Personal history of fall 01/27/2012   Pneumonia, organism unspecified(486) 01/23/2005   Rheumatic fever 09/26/1933   Age 75 10/01/14  ESR 13, RAF <10    Seasonal allergies 05/03/2013   Stasis dermatitis of both legs 09/26/2014   Unspecified constipation 11/02/2012   Unspecified hearing loss 02/08/2013   Unspecified hereditary and idiopathic peripheral neuropathy 01/27/2012   Urinary frequency 02/24/2014   Ventricular fibrillation ([place]) 01/24/2012   Past Surgical History:  Procedure Laterality Date   ABDOMINAL HYSTERECTOMY  1990   for endometrial cancer   CATARACT EXTRACTION W/ INTRAOCULAR LENS  IMPLANT, BILATERAL     ECTOPIC [place]   bilateral   THYROIDECTOMY  1960   TONSILLECTOMY  1916    Allergies  Allergen Reactions   Amoxicillin     Unknown, patient unable to answer questionnaire    Aspirin Other (See Comments)    On MAR   Avelox [Moxifloxacin]     Unknown: listed on MAR   Erythromycin     Unknown: listed on MAR   Monistat [Miconazole]     Unknown: listed on MAR   Morphine And Related     Unknown: listed on MAR   Orange Juice [Orange Oil]     Unknown: listed on MAR    Allergies as of 09/14/2021       Reactions   Amoxicillin    Unknown, patient unable to answer questionnaire    Aspirin Other (See Comments)   On MAR   Avelox [moxifloxacin]    Unknown: listed on MAR   Erythromycin    Unknown: listed on MAR   Monistat [miconazole]    Unknown: listed on MAR   Morphine And Related    Unknown: listed on MAR   Orange Juice [orange Oil]    Unknown: listed on [facility]        Medication List        Accurate as of September 14, 2021 11:59 PM. If you have any questions, ask your nurse or doctor.          aspirin 81 MG chewable tablet Chew 81 mg by mouth daily.   carboxymethylcellulose 0.5 % Soln Commonly known as: REFRESH PLUS Place 1 drop into both eyes 2 (two) times daily.   fexofenadine 60 MG tablet Commonly known as: ALLEGRA Take 60 mg by mouth daily.   ipratropium-albuterol 0.5-2.5 (3) MG/3ML Soln Commonly known as:  DUONEB Take 3 mLs by nebulization 2 (two) times daily.   levothyroxine 137 MCG tablet Commonly known as: SYNTHROID Take 137 mcg  by mouth daily before breakfast.   melatonin 3 MG Tabs tablet Take 3 mg by mouth at bedtime.   MILK OF MAGNESIA PO Take 30 mLs by mouth as needed.   OcuSoft Lid Scrub Allergy Pads Place 1 each into both eyes in the morning and at bedtime. Cleanse bilateral lids for excess eye secretions.   potassium chloride 10 MEQ tablet Commonly known as: KLOR-CON Take 10 mEq by mouth. Mon, Wed, Fri   sennosides-docusate sodium 8.6-50 MG tablet Commonly known as: SENOKOT-S Take 2 tablets by mouth at bedtime.   torsemide 10 MG tablet Commonly known as: DEMADEX Take 10 mg by mouth daily. MON, WED, FRI   traMADol 50 MG tablet Commonly known as: ULTRAM Take 50 mg by mouth every 8 (eight) hours as needed.   zinc oxide 20 % ointment Apply 1 application topically daily as needed for irritation (To buttocks after every incontinent episode and for redness.).        Review of Systems  Constitutional:  Positive for fatigue. Negative for appetite change and fever.  HENT:  Positive for hearing loss. Negative for congestion and voice change.   Eyes:  Negative for visual disturbance.  Respiratory:  Positive for shortness of breath. Negative for cough.        DOE  Cardiovascular:  Positive for leg swelling.  Gastrointestinal:  Negative for abdominal pain and constipation.  Genitourinary:  Positive for frequency. Negative for dysuria and urgency.       Denied dysuria today.   Musculoskeletal:  Positive for arthralgias and gait problem.  Skin:  Negative for color change.  Neurological:  Negative for speech difficulty, numbness and headaches.       Memory lapses.   Psychiatric/Behavioral:  Negative for behavioral problems and sleep disturbance. The patient is not nervous/anxious.    Immunization History  Administered Date(s) Administered   Influenza Whole 10/19/2012,  09/15/2018   Influenza, High Dose Seasonal PF 09/14/2019   Influenza-Unspecified 10/18/2013, 10/17/2014, 09/02/2015, 09/29/2016, 10/03/2017, 09/23/2020   Moderna Sars-Covid-2 Vaccination 12/15/2019, 01/12/2020, 10/21/2020, 05/12/2021   PPD Test 01/22/2013, 11/28/2015   Pneumococcal Conjugate-13 11/18/2017   Pneumococcal Polysaccharide-23 01/22/2013   Td 02/21/2009   Tdap 04/07/2015   Pertinent  Health Maintenance Due  Topic Date Due   INFLUENZA VACCINE  07/13/2021   Fall Risk  09/05/2018 09/02/2017 11/14/2015 04/17/2015 09/26/2014  Falls in the past year? Yes No No Yes No  Number falls in past yr: 2 or more - - 1 -  Injury with Fall? Yes - - Yes -  Comment - - - laceration forehead 04/07/15 -  Risk Factor Category  - - - High Fall Risk -  Risk for fall due to : - - Impaired mobility;Impaired vision - -   Functional Status Survey:    Vitals:   09/14/21 1422  BP: (!) 162/79  Pulse: 70  Resp: 18  Temp: (!) 97.1 F (36.2 C)  SpO2: 97%  Weight: 110 lb 6.4 oz (50.1 kg)  Height: 5' (1.524 m)   Body mass index is 21.56 kg/m. Physical Exam Vitals and nursing note reviewed.  Constitutional:      Comments: Sleepy.  HENT:     Head: Normocephalic and atraumatic.     Nose: Nose normal. No congestion or rhinorrhea.     Mouth/Throat:     Mouth: Mucous membranes are moist.  Eyes:     Extraocular Movements: Extraocular movements intact.     Pupils: Pupils are equal, round, and reactive to light.  Cardiovascular:     Rate and Rhythm: Normal rate and regular rhythm.     Heart sounds: Murmur heard.  Pulmonary:     Breath sounds: No rales.  Abdominal:     General: Bowel sounds are normal.     Palpations: Abdomen is soft.     Tenderness: There is no abdominal tenderness.  Musculoskeletal:     Cervical back: Normal range of motion and neck supple.     Right lower leg: Edema present.     Left lower leg: Edema present.     Comments: Trace edema BLE  Skin:    General: Skin is warm and  dry.  Neurological:     General: No focal deficit present.     Mental Status: She is alert. Mental status is at baseline.     Gait: Gait abnormal.     Comments: Oriented to person, place.   Psychiatric:        Mood and Affect: Mood normal.        Behavior: Behavior normal.    Labs reviewed: Recent Labs    10/17/20 0000 02/27/21 0000 06/04/21 0000  NA 140 146 136*  K 4.3 3.6 3.8  CL 97* 106 95*  CO2 30* 28* 31*  BUN 33* 46* 23*  CREATININE 1.1 1.2* 1.2*  CALCIUM 9.4 8.7 8.7   Recent Labs    11/14/20 0000 02/27/21 0000 06/04/21 0000  AST 17 299* 16  ALT 13 299* 17  ALKPHOS 71 234* 100  ALBUMIN 3.2* 3.3* 3.2*   Recent Labs    10/17/20 0000 02/27/21 0000  WBC 5.0 10.6  NEUTROABS 2,520.00 7,897.00  HGB 14.4 13.8  HCT 44 43  PLT 220 174   Lab Results  Component Value Date   TSH 1.64 04/24/2021   No results found for: HGBA1C No results found for: CHOL, HDL, LDLCALC, LDLDIRECT, TRIG, CHOLHDL  Significant Diagnostic Results in last 30 days:  No results found.  Assessment/Plan Hypothyroidism takes Levothyroxine 137.74mg qd since 02/27/21, TSH 1.64 04/24/21  Constipation  takes Senokot S II qhs, stable.   COPD, mild (HCC) Stable, takes Allegra 65mqd, DuoNeb bid, prn O2 via [place]  PVD (peripheral vascular disease) (HCC) chronic, minimal BLE, mostly on top of feet,  takes Torsemide  Chronic kidney disease (CKD), stage III (moderate) Bun/creat 23/1.15 eGFR 37 06/04/21  Osteoarthritis, multiple sites prn Tylenol, Tramadol.   TIA (transient ischemic attack) TIA ?, Hx of CAD,  takes ASA 8151md.   Cholelithiasis Elevated LFT off routine Tylenol, RUQ [place]/9/21 [place] multiple gallstones in the gallbladder, largest 0.8cm.   Memory deficits needs remainders, verbal cues frequently.  Gait instability  w/c for mobility, needs assistance with transfer.  Adult failure to thrive  Adult failure to thrive, under hospice service, gradual weight loss.   HTN  (hypertension) Elevated SBp today, continue to monitor Bp    Family/ staff Communication: plan of care reviewed with the patient and charge nurse.   Labs/tests ordered:  none  Time spend 25 minutes.

## 2021-09-14 NOTE — Assessment & Plan Note (Signed)
w/c for mobility, needs assistance with transfer. °

## 2021-09-14 NOTE — Assessment & Plan Note (Signed)
chronic, minimal BLE, mostly on top of feet,  takes Torsemide °

## 2021-09-14 NOTE — Assessment & Plan Note (Signed)
Elevated LFT off routine Tylenol, RUQ US, 10/21/20 US abd multiple gallstones in the gallbladder, largest 0.8cm.    

## 2021-09-14 NOTE — Assessment & Plan Note (Addendum)
takes Senokot S II qhs, stable.

## 2021-09-14 NOTE — Assessment & Plan Note (Signed)
Stable, takes Allegra 60mg qd, DuoNeb bid, prn O2 via [place] 

## 2021-09-14 NOTE — Assessment & Plan Note (Signed)
Adult failure to thrive, under hospice service, gradual weight loss.

## 2021-09-14 NOTE — Assessment & Plan Note (Signed)
prn Tylenol, Tramadol.  °

## 2021-09-15 ENCOUNTER — Encounter: Payer: Self-pay | Admitting: Nurse Practitioner

## 2021-09-15 NOTE — Assessment & Plan Note (Signed)
Elevated SBp today, continue to monitor Bp

## 2021-10-20 ENCOUNTER — Encounter: Payer: Self-pay | Admitting: Internal Medicine

## 2021-10-20 ENCOUNTER — Non-Acute Institutional Stay (SKILLED_NURSING_FACILITY): Admitting: Internal Medicine

## 2021-10-20 DIAGNOSIS — G459 Transient cerebral ischemic attack, unspecified: Secondary | ICD-10-CM | POA: Diagnosis not present

## 2021-10-20 DIAGNOSIS — R748 Abnormal levels of other serum enzymes: Secondary | ICD-10-CM | POA: Diagnosis not present

## 2021-10-20 DIAGNOSIS — E890 Postprocedural hypothyroidism: Secondary | ICD-10-CM

## 2021-10-20 DIAGNOSIS — I872 Venous insufficiency (chronic) (peripheral): Secondary | ICD-10-CM | POA: Diagnosis not present

## 2021-10-20 DIAGNOSIS — R600 Localized edema: Secondary | ICD-10-CM

## 2021-10-20 DIAGNOSIS — J449 Chronic obstructive pulmonary disease, unspecified: Secondary | ICD-10-CM | POA: Diagnosis not present

## 2021-10-20 NOTE — Progress Notes (Signed)
Location:   [place] Room Number: 28 Place of Service:  SNF [phone]) Provider:  Veleta Miners MD  [name], MD  Patient Care Team: [name], MD as PCP - General (Internal Medicine) [place], [place], [facility] Mast, Man X, NP as Nurse Practitioner (Nurse Practitioner)  Extended Emergency Contact Information Primary Emergency Contact: [name] [place] Phone: [phone] Relation: Son Secondary Emergency Contact: [place] Phone: [phone] Mobile Phone: [phone] Relation: Daughter  Code Status:  DNR Hospice Managed Care Goals of care: Advanced Directive information Advanced Directives 10/20/2021  Does Patient Have a Medical Advance Directive? Yes  Type of Paramedic of [place];Living will;Out of facility DNR (pink MOST or yellow form)  Does patient want to make changes to medical advance directive? No - Patient declined  Copy of [place] in Chart? Yes - validated most recent copy scanned in chart (See row information)  Would patient like information on creating a medical advance directive? -  Pre-existing out of facility DNR order (yellow form or pink MOST form) Yellow form placed in chart (order not valid for inpatient use);Pink MOST form placed in chart (order not valid for inpatient use)     Chief Complaint  Patient presents with   Medical Management of Chronic Issues   Quality Metric Gaps    HPI:  Pt is a 87 y.o. female seen today for medical management of chronic diseases.     Patient has h/o Bilateral LE edema , Hypertension, Hard of hearing, h/o Cellulitis, Hypothyroidism, COPD, Chronic Conjunctivitis  has h/o Gall stones with Hepatitis    Unable to get much history due to very [place] to stay stable Has lost some weight No New Issues. Doe shave S[employer] 2 PU in [place] area Mostly stays in her  recliner  Past Medical History:  Diagnosis Date   Abnormal liver function tests    11/11/15 AST 31, ALT 88, alk phos 96    Acute kidney failure, unspecified ([place]) 01/24/2012   Arthralgia 09/26/2014   Multiple joints: knees, shoulders, wrists, spine hips    Arthritis    Candidiasis of other urogenital sites 08/10/2012   Cerebral embolism 05/23/2005   CHF (congestive heart failure) ([place])    Cholelithiasis 11/04/2015   Closed fracture of sacrum and coccyx without mention of spinal cord injury 02/14/2012   Constipation 05/03/2013   Contusion of face, scalp, and neck except eye(s) 06/01/2012   Contusion of wrist 06/01/2012   COPD (chronic obstructive pulmonary disease) (HCC)    COPD, mild ([place]) 01/20/2013   Edema 04/20/2012   H/O: CVA (cerebrovascular accident)    Hearing loss 09/26/2014   History of cancer of uterus    HTN (hypertension), benign    Hyperglycemia 11/14/2015   Hypothyroidism    Insomnia, unspecified 08/10/2012   Open wound of knee, leg (except thigh), and ankle, without mention of complication 06/19/5884   Other and unspecified hyperlipidemia 06/01/2012   Other disorder of coccyx 01/27/2012   Pain in joint, ankle and foot 06/14/2012   Peripheral vascular disease, unspecified ([place]) 01/27/2012   Personal history of fall 01/27/2012   Pneumonia, organism unspecified(486) 01/23/2005   Rheumatic fever 09/26/1933   Age 37 10/01/14 ESR 13, RAF <10    Seasonal allergies 05/03/2013   Stasis dermatitis of both legs 09/26/2014   Unspecified constipation 11/02/2012   Unspecified hearing loss 02/08/2013   Unspecified hereditary and idiopathic peripheral neuropathy 01/27/2012  Urinary frequency 02/24/2014   Ventricular fibrillation ([place]) 01/24/2012   Past Surgical History:  Procedure Laterality Date   ABDOMINAL HYSTERECTOMY  1990   for endometrial cancer   CATARACT EXTRACTION W/ INTRAOCULAR LENS  IMPLANT, BILATERAL     ECTOPIC PREGNANCY SURGERY  1952   GUM SURGERY  1932   MASTOIDECTOMY  1920    bilateral   THYROIDECTOMY  1960   TONSILLECTOMY  1916    Allergies  Allergen Reactions   Amoxicillin     Unknown, patient unable to answer questionnaire    Aspirin Other (See Comments)    On MAR   Avelox [Moxifloxacin]     Unknown: listed on MAR   Erythromycin     Unknown: listed on MAR   Monistat [Miconazole]     Unknown: listed on MAR   Morphine And Related     Unknown: listed on MAR   Orange Juice [Orange Oil]     Unknown: listed on MAR    Allergies as of 10/20/2021       Reactions   Amoxicillin    Unknown, patient unable to answer questionnaire    Aspirin Other (See Comments)   On MAR   Avelox [moxifloxacin]    Unknown: listed on MAR   Erythromycin    Unknown: listed on MAR   Monistat [miconazole]    Unknown: listed on MAR   Morphine And Related    Unknown: listed on MAR   Orange Juice [orange Oil]    Unknown: listed on [facility]        Medication List        Accurate as of October 20, 2021 11:31 AM. If you have any questions, ask your nurse or doctor.          aspirin 81 MG chewable tablet Chew 81 mg by mouth daily.   carboxymethylcellulose 0.5 % Soln Commonly known as: REFRESH PLUS Place 1 drop into both eyes 2 (two) times daily.   fexofenadine 60 MG tablet Commonly known as: ALLEGRA Take 60 mg by mouth daily.   ipratropium-albuterol 0.5-2.5 (3) MG/3ML Soln Commonly known as: DUONEB Take 3 mLs by nebulization 2 (two) times daily.   levothyroxine 137 MCG tablet Commonly known as: SYNTHROID Take 137 mcg by mouth daily before breakfast.   melatonin 3 MG Tabs tablet Take 3 mg by mouth at bedtime.   MILK OF MAGNESIA PO Take 30 mLs by mouth as needed.   OcuSoft Lid Scrub Allergy Pads Place 1 each into both eyes in the morning and at bedtime. Cleanse bilateral lids for excess eye secretions.   potassium chloride 10 MEQ tablet Commonly known as: KLOR-CON Take 10 mEq by mouth. Mon, Wed, Fri   sennosides-docusate sodium 8.6-50 MG  tablet Commonly known as: SENOKOT-S Take 2 tablets by mouth at bedtime.   torsemide 10 MG tablet Commonly known as: DEMADEX Take 10 mg by mouth daily. MON, WED, FRI   traMADol 50 MG tablet Commonly known as: ULTRAM Take 50 mg by mouth every 8 (eight) hours as needed.   zinc oxide 20 % ointment Apply 1 application topically daily as needed for irritation (To buttocks after every incontinent episode and for redness.).        Review of Systems Very hard of hearing Immunization History  Administered Date(s) Administered   Influenza Whole 10/19/2012, 09/15/2018   Influenza, High Dose Seasonal PF 09/14/2019   Influenza-Unspecified 10/18/2013, 10/17/2014, 09/02/2015, 09/29/2016, 10/03/2017, 09/23/2020   Moderna Sars-Covid-2 Vaccination 12/15/2019, 01/12/2020, 10/21/2020, 05/12/2021  PPD Test 01/22/2013, 11/28/2015   Pneumococcal Conjugate-13 11/18/2017   Pneumococcal Polysaccharide-23 01/22/2013   Td 02/21/2009   Tdap 04/07/2015   Pertinent  Health Maintenance Due  Topic Date Due   INFLUENZA VACCINE  07/13/2021   Fall Risk 09/02/2017 09/05/2018 10/12/2020 10/12/2020 07/06/2021  Falls in the past year? No Yes - - -  Was there an injury with Fall? - Yes - - -  Was there an injury with Fall? - - - - -  Patient Fall Risk Level - - High fall risk High fall risk High fall risk  Patient at Risk for Falls Due to - - - - -   Functional Status Survey:    Vitals:   10/20/21 1111  BP: 110/60  Pulse: 76  Resp: 18  Temp: (!) 97.1 F (36.2 C)  SpO2: 98%  Weight: 119 lb 4.8 oz (54.1 kg)  Height: 5' (1.524 m)   Body mass index is 23.3 kg/m. Physical Exam Vitals reviewed.  Constitutional:      Appearance: Normal appearance.  HENT:     Head: Normocephalic.     Nose: Nose normal.     Mouth/Throat:     Mouth: Mucous membranes are moist.     Pharynx: Oropharynx is clear.  Eyes:     Pupils: Pupils are equal, round, and reactive to light.  Cardiovascular:     Rate and Rhythm:  Normal rate and regular rhythm.     Pulses: Normal pulses.  Pulmonary:     Effort: Pulmonary effort is normal.     Breath sounds: Normal breath sounds.  Abdominal:     General: Abdomen is flat. Bowel sounds are normal.     Palpations: Abdomen is soft.  Musculoskeletal:        General: No swelling.     Cervical back: Neck supple.  Skin:    General: Skin is warm.  Neurological:     General: No focal deficit present.     Mental Status: She is alert.  Psychiatric:        Mood and Affect: Mood normal.        Thought Content: Thought content normal.    Labs reviewed: Recent Labs    02/27/21 0000 06/04/21 0000  NA 146 136*  K 3.6 3.8  CL 106 95*  CO2 28* 31*  BUN 46* 23*  CREATININE 1.2* 1.2*  CALCIUM 8.7 8.7   Recent Labs    11/14/20 0000 02/27/21 0000 06/04/21 0000  AST 17 299* 16  ALT 13 299* 17  ALKPHOS 71 234* 100  ALBUMIN 3.2* 3.3* 3.2*   Recent Labs    02/27/21 0000  WBC 10.6  NEUTROABS 7,897.00  HGB 13.8  HCT 43  PLT 174   Lab Results  Component Value Date   TSH 1.64 04/24/2021   No results found for: HGBA1C No results found for: CHOL, HDL, LDLCALC, LDLDIRECT, TRIG, CHOLHDL  Significant Diagnostic Results in last 30 days:  No results found.  Assessment/Plan Postoperative hypothyroidism TSH normal in 5/22 Elevated liver enzymes Has Gall stones but asymptomatic LFTS have come down TIA (transient ischemic attack) On baby aspirin Edema of both lower legs due to peripheral venous insufficiency Low dose of Torsemide COPD, mild (HCC) [place]  Enrolled in hospice   Family/ staff Communication:   Labs/tests ordered:

## 2021-11-03 IMAGING — CT CT HEAD W/O CM
3 series · 14 of 47 positions shown, 16 images · non-contrast
Comparison: 10/12/2020

CLINICAL DATA: Trauma. Unwitnessed fall at [REDACTED].

EXAM:
CT HEAD WITHOUT CONTRAST
CT MAXILLOFACIAL WITHOUT CONTRAST
CT CERVICAL SPINE WITHOUT CONTRAST
TECHNIQUE: Multidetector CT imaging of the head, cervical spine, and
maxillofacial structures were performed using the standard protocol
without intravenous contrast. Multiplanar CT image reconstructions
of the cervical spine and maxillofacial structures were also
generated.

[Series 3: head 5.0 h30s · axial · 0.47mm/px · z∈[-113,+27]mm · 8 of 34 slices shown, 10 images]
[im 3/34  brain]
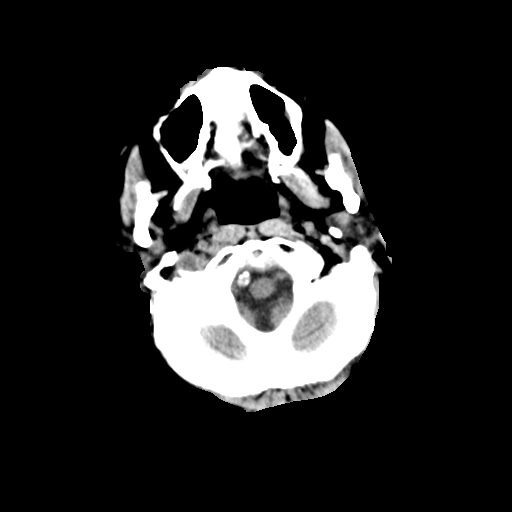
[im 3/34  bone]
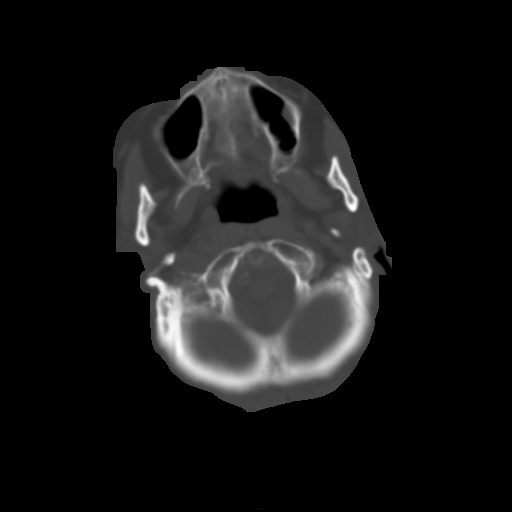
[im 7/34  brain]
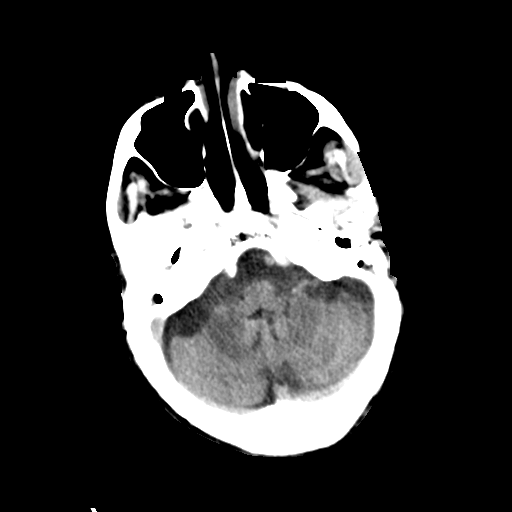
[im 11/34  brain]
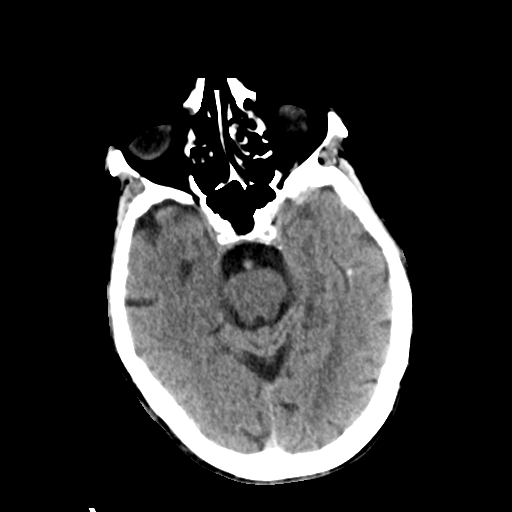
[im 15/34  brain]
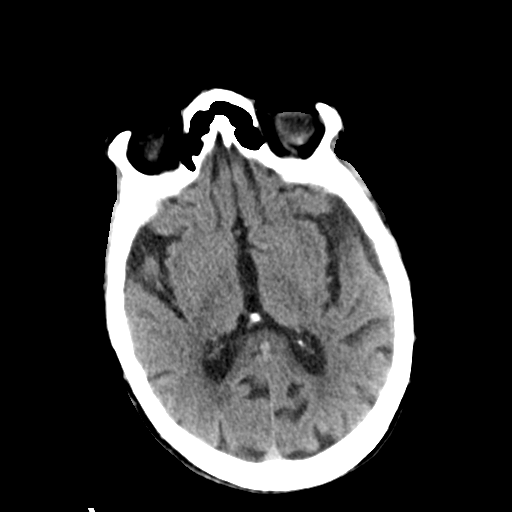
[im 19/34  brain]
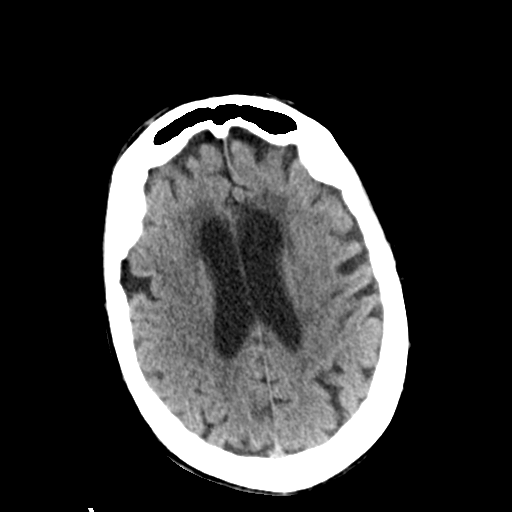
[im 19/34  bone]
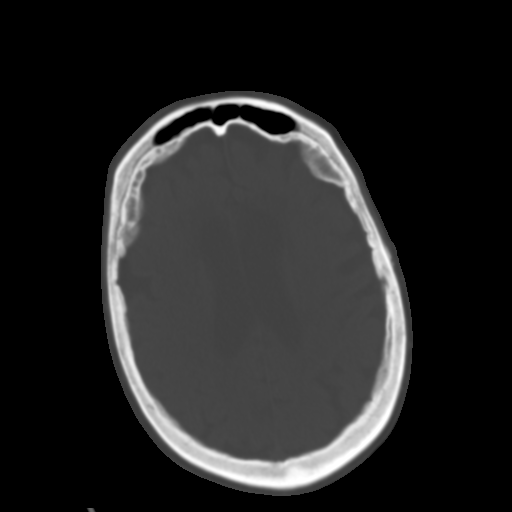
[im 23/34  brain]
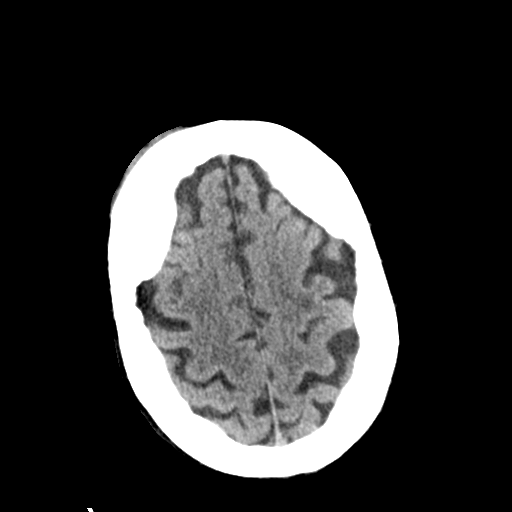
[im 27/34  brain]
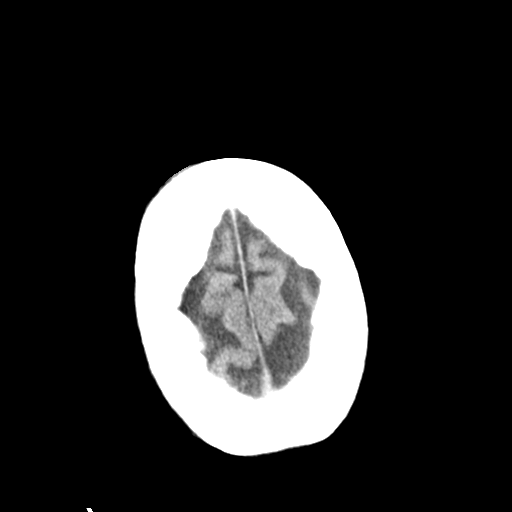
[im 31/34  brain]
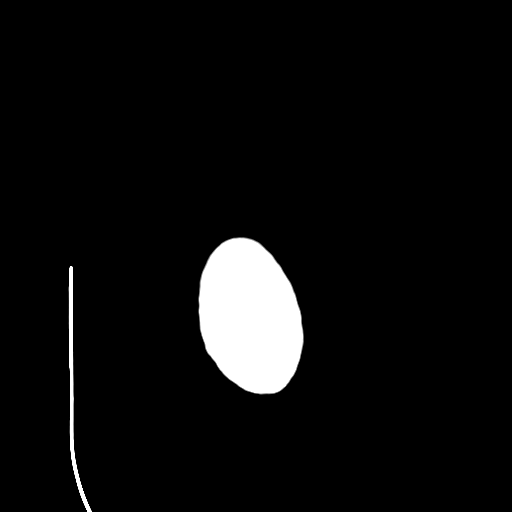

[Series 5: head 3.0 mpr cor · coronal · 0.33mm/px · 3 of 75 slices shown]
[im 25/75  brain]
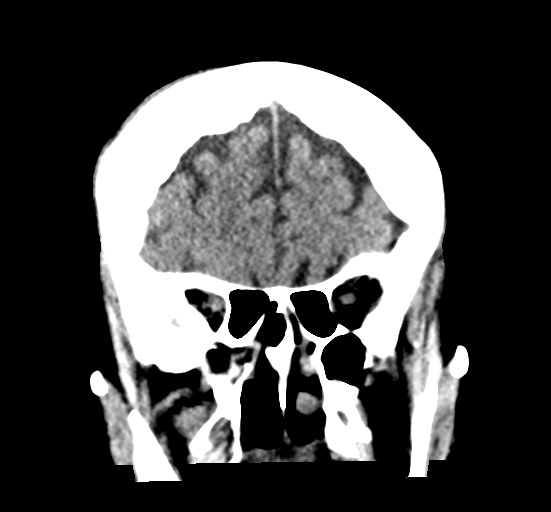
[im 33/75  brain]
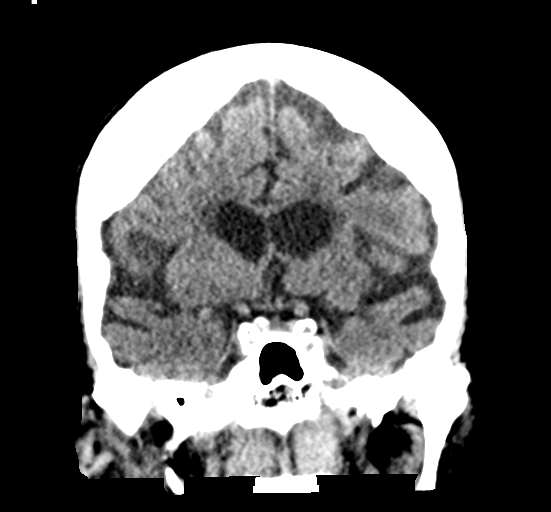
[im 42/75  brain]
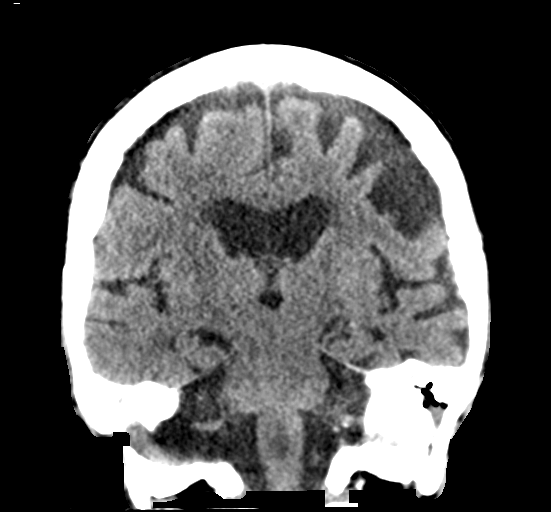

[Series 6: head 3.0 mpr sag · sagittal · 0.33mm/px · 3 of 67 slices shown]
[im 23/67  brain]
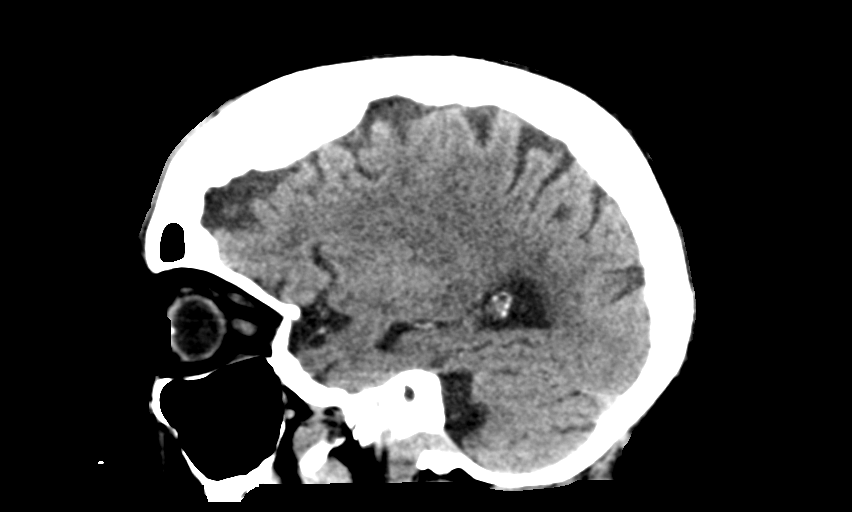
[im 34/67  brain]
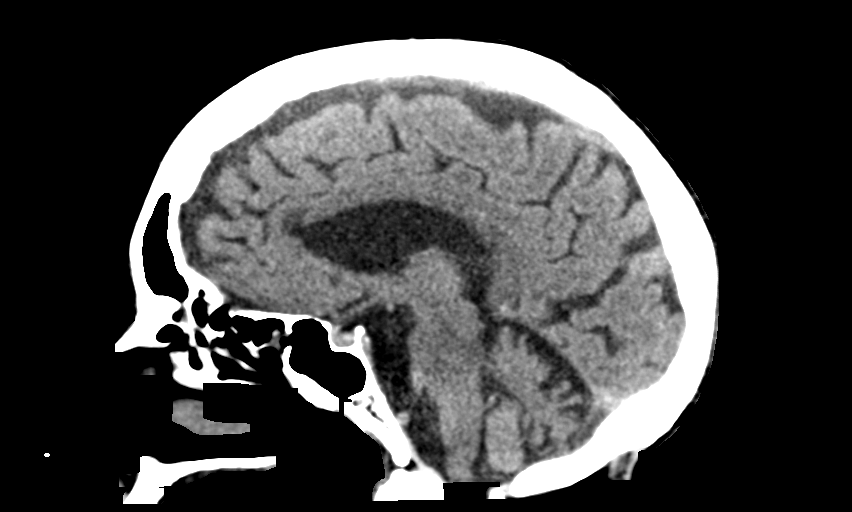
[im 45/67  brain]
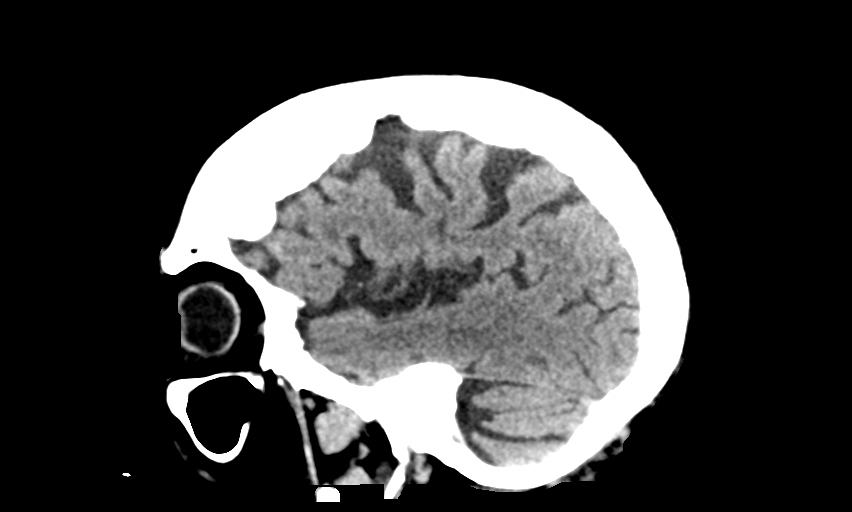

[14 of 47 positions shown; findings below may reference images not displayed]

FINDINGS: CT HEAD FINDINGS

Brain: No evidence of acute infarction, hemorrhage, hydrocephalus,
extra-axial collection or mass lesion/mass effect. Prominence of
sulci and ventricles compatible with age related brain atrophy.
There is mild diffuse low-attenuation within the subcortical and
periventricular white matter compatible with chronic microvascular
disease.

Vascular: No hyperdense vessel or unexpected calcification.

Skull: Normal. Negative for fracture or focal lesion.

Other: Right frontal scalp hematoma, image [DATE].

CT MAXILLOFACIAL FINDINGS

Osseous: No fracture or mandibular dislocation. No destructive
process.

Orbits: Negative. No traumatic or inflammatory finding.

Sinuses: Postop change from left median antrectomy. Mild mucosal
thickening noted within the left maxillary sinus. No sinus fluid
levels. Bilateral mastoid air cell opacification

Soft tissues: Negative

CT CERVICAL SPINE FINDINGS

Alignment: Normal.

Skull base and vertebrae: Vertebral body heights are well preserved.
No acute fractures

Soft tissues and spinal canal: No prevertebral fluid or swelling. No
visible canal hematoma.

Disc levels: Severe multilevel degenerative disc and joint disease.

Upper chest: Negative.

Other: None
IMPRESSION: 1. No acute intracranial abnormality.
2. Chronic small vessel ischemic change and brain atrophy.
3. Right frontal scalp hematoma.
4. No evidence for facial bone fracture.
5. No evidence for cervical spine fracture or dislocation.
6. Severe multilevel cervical degenerative disc and joint disease.

## 2021-11-03 IMAGING — DX DG CHEST 1V
1 series · 1 of 1 positions shown · non-contrast
Comparison: 01/19/2013

CLINICAL DATA: Fall

Altered mental status
EXAM:
CHEST  1 VIEW

[t chest ap upright]
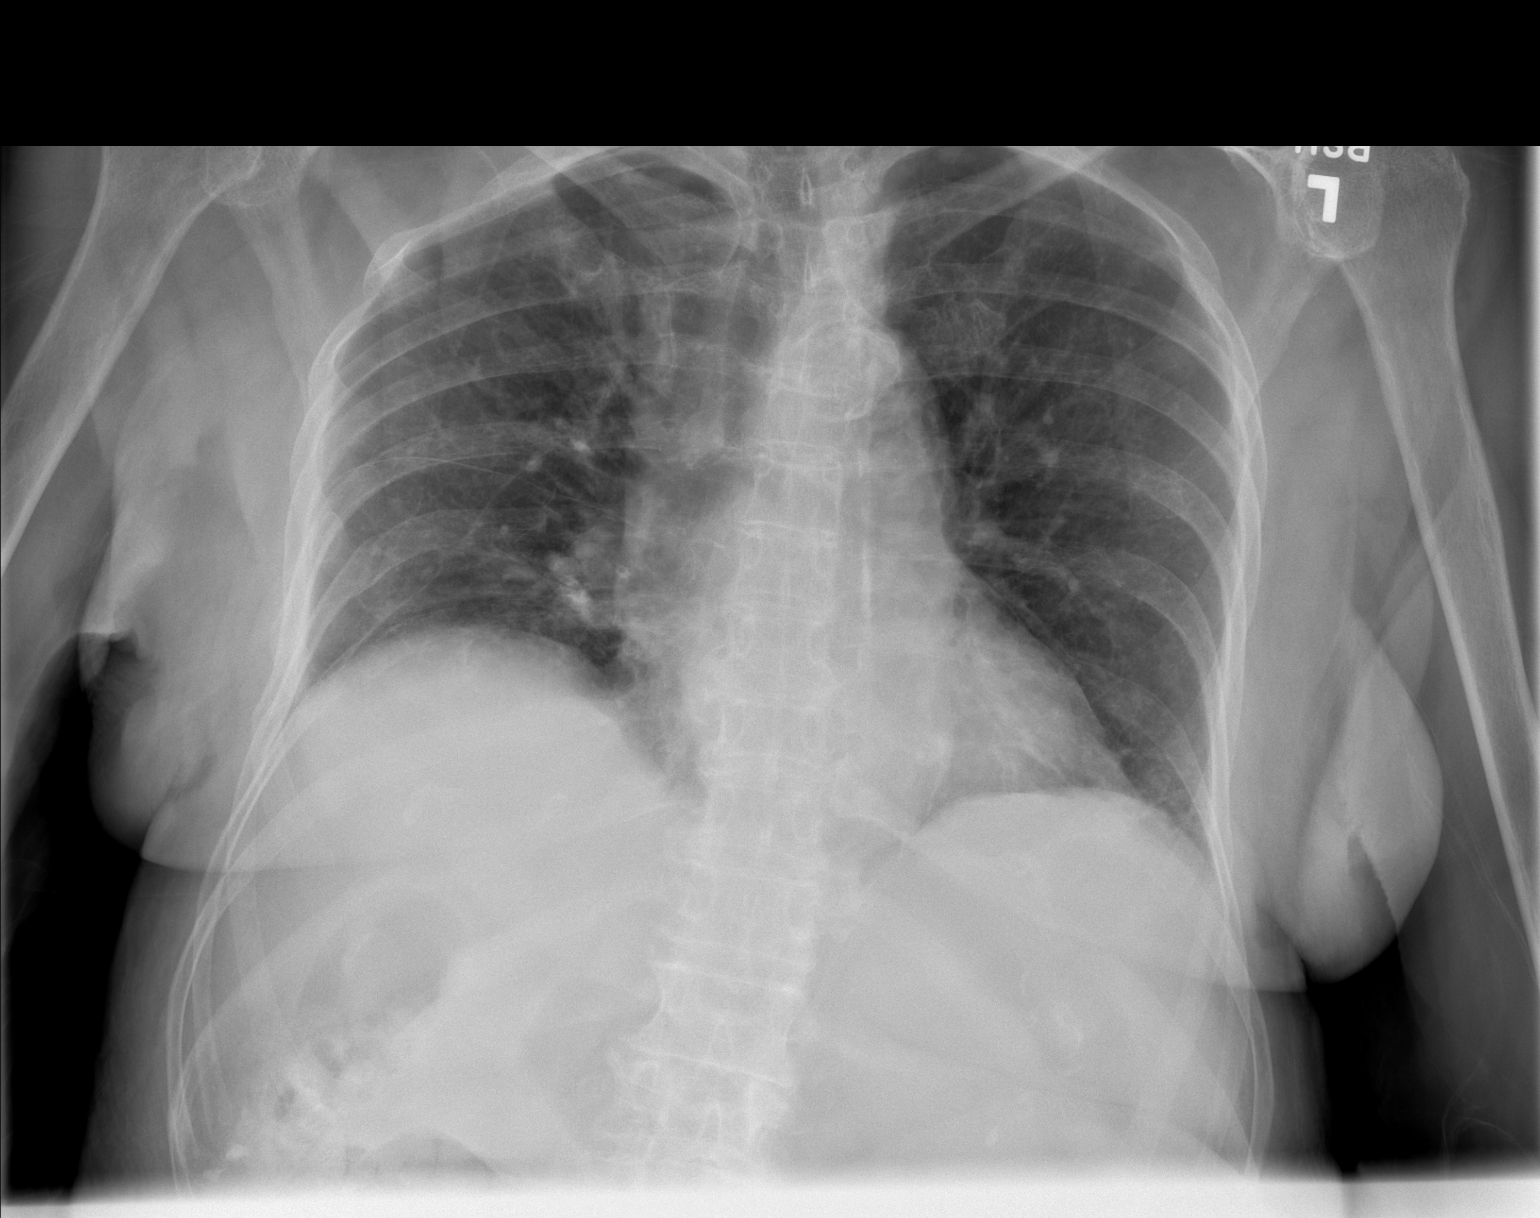

[1 of 1 positions shown; findings below may reference images not displayed]

FINDINGS: Heart size within normal limits. No pulmonary vascular congestion.
Lungs are clear.
IMPRESSION: No acute cardiopulmonary process.

## 2021-11-03 IMAGING — DX DG SHOULDER 2+V*R*
3 series · 3 of 3 positions shown · non-contrast
Comparison: None.

CLINICAL DATA: Status post fall.  Altered mental status.

EXAM:
RIGHT SHOULDER - 2+ VIEW

[t shoulder internal right (1 of 2)]
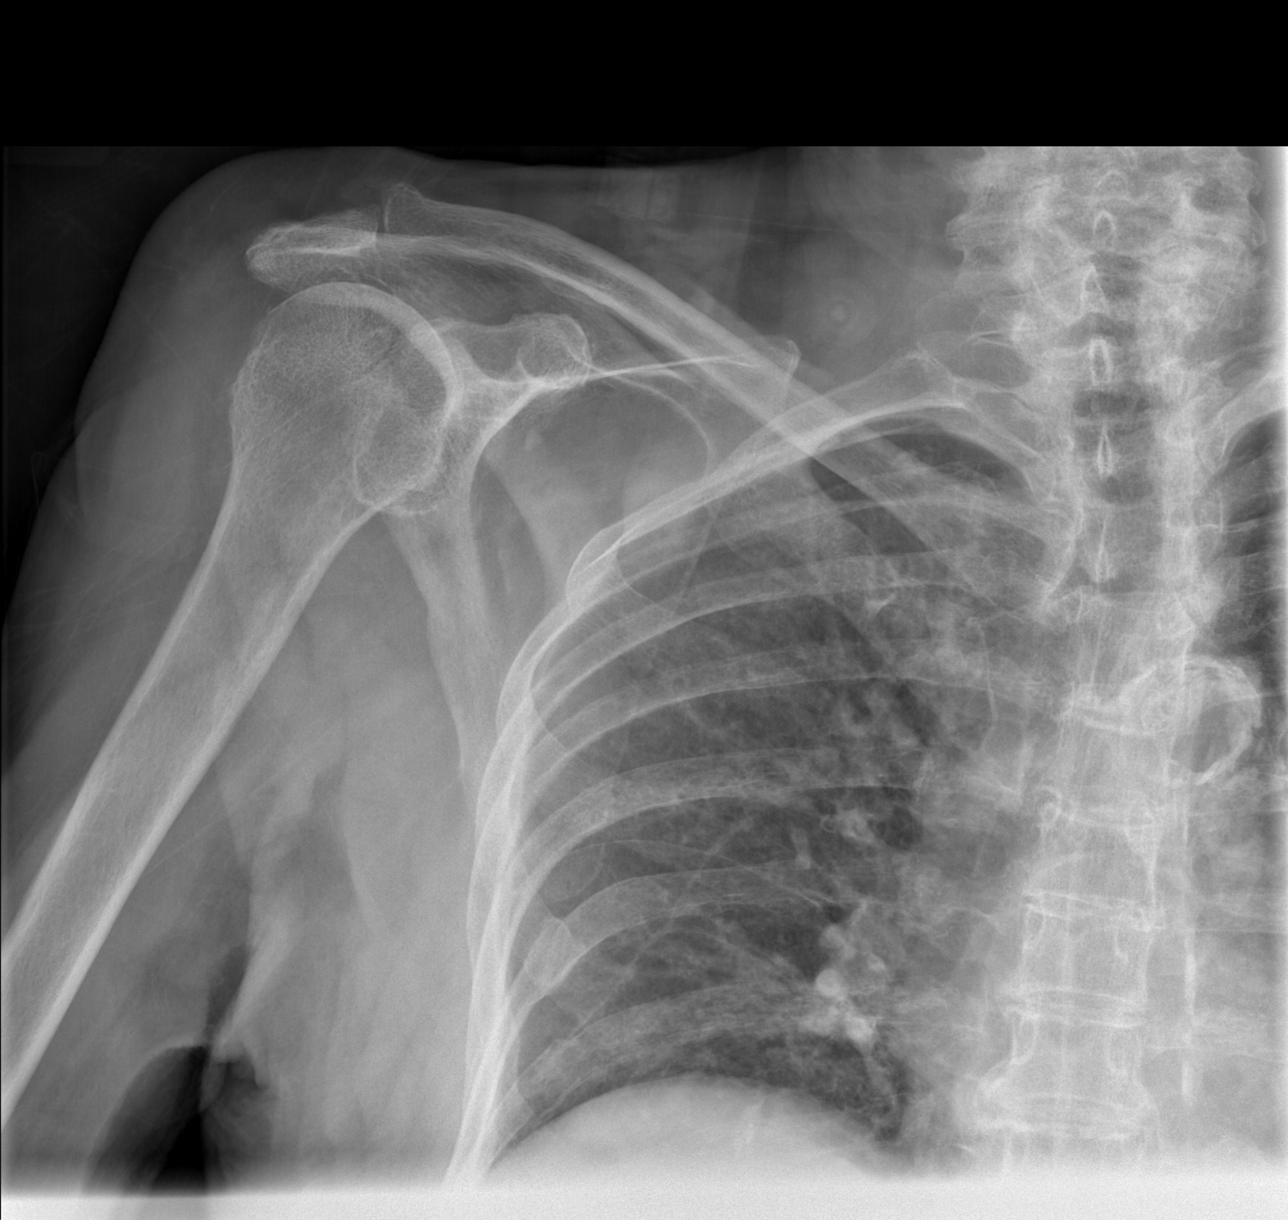

[t shoulder internal right (2 of 2)]
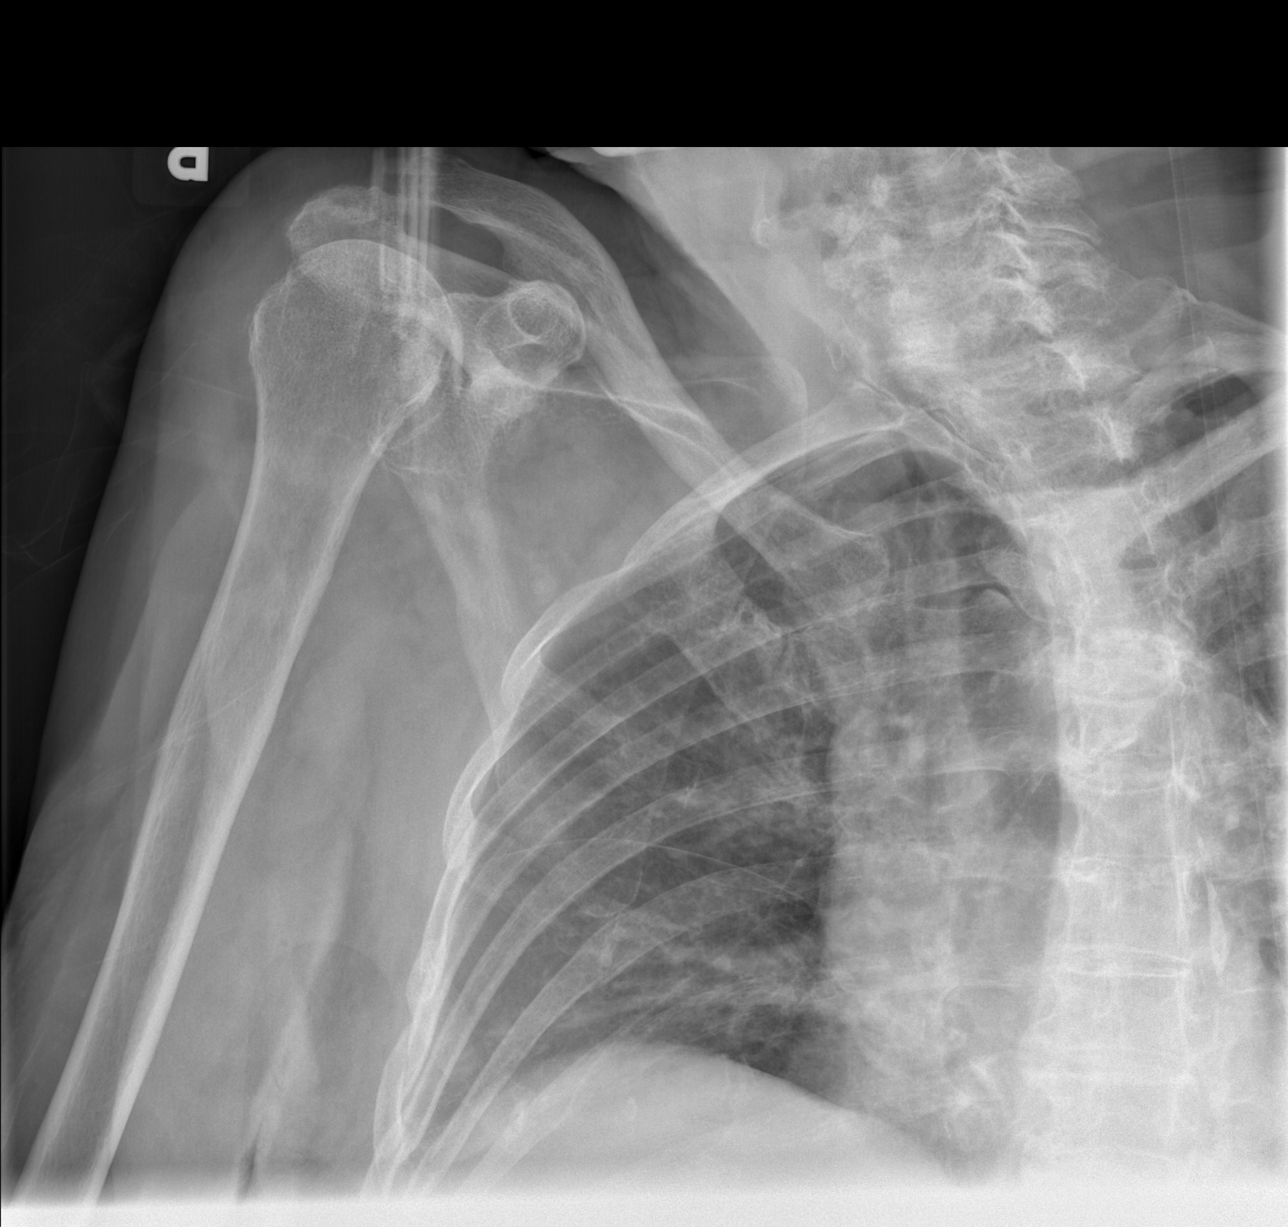

[t shoulder y-view right]
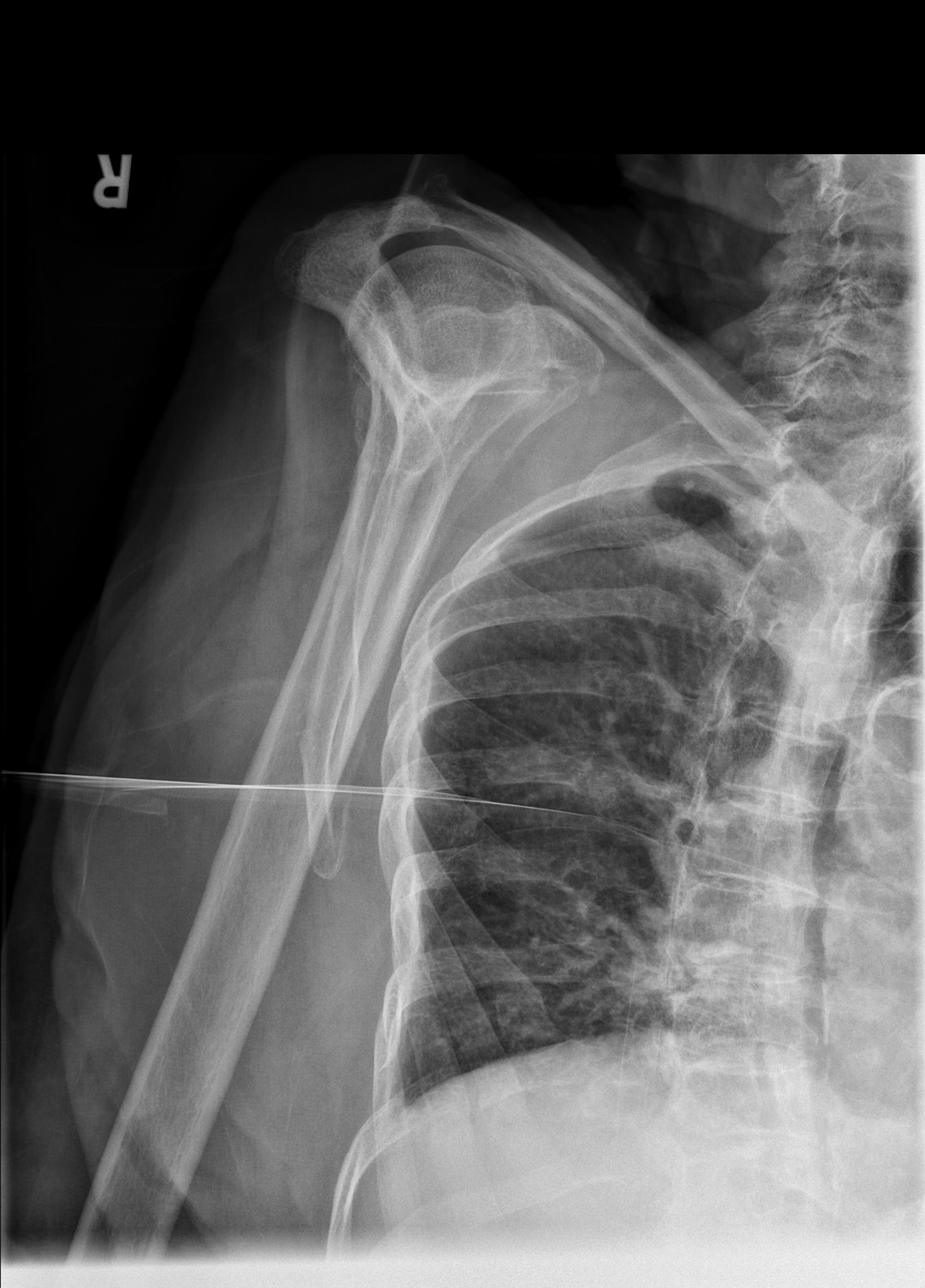

[3 of 3 positions shown; findings below may reference images not displayed]

FINDINGS: There is no evidence of fracture or dislocation. Degenerative
changes identified at the AC joint and glenohumeral joint. Remote
healed right rib fractures. Soft tissues are unremarkable.
IMPRESSION: 1. No acute findings.
2. Degenerative joint disease.

## 2021-11-27 ENCOUNTER — Encounter: Payer: Self-pay | Admitting: Nurse Practitioner

## 2021-11-27 ENCOUNTER — Non-Acute Institutional Stay (SKILLED_NURSING_FACILITY): Payer: Medicare Other | Admitting: Nurse Practitioner

## 2021-11-27 DIAGNOSIS — R748 Abnormal levels of other serum enzymes: Secondary | ICD-10-CM

## 2021-11-27 DIAGNOSIS — E890 Postprocedural hypothyroidism: Secondary | ICD-10-CM | POA: Diagnosis not present

## 2021-11-27 DIAGNOSIS — K5900 Constipation, unspecified: Secondary | ICD-10-CM | POA: Diagnosis not present

## 2021-11-27 DIAGNOSIS — R413 Other amnesia: Secondary | ICD-10-CM

## 2021-11-27 DIAGNOSIS — G459 Transient cerebral ischemic attack, unspecified: Secondary | ICD-10-CM

## 2021-11-27 DIAGNOSIS — J449 Chronic obstructive pulmonary disease, unspecified: Secondary | ICD-10-CM

## 2021-11-27 DIAGNOSIS — R2681 Unsteadiness on feet: Secondary | ICD-10-CM

## 2021-11-27 DIAGNOSIS — R296 Repeated falls: Secondary | ICD-10-CM

## 2021-11-27 DIAGNOSIS — R627 Adult failure to thrive: Secondary | ICD-10-CM

## 2021-11-27 DIAGNOSIS — I872 Venous insufficiency (chronic) (peripheral): Secondary | ICD-10-CM

## 2021-11-27 DIAGNOSIS — M2550 Pain in unspecified joint: Secondary | ICD-10-CM

## 2021-11-27 DIAGNOSIS — R600 Localized edema: Secondary | ICD-10-CM

## 2021-11-27 DIAGNOSIS — N1832 Chronic kidney disease, stage 3b: Secondary | ICD-10-CM

## 2021-11-27 NOTE — Assessment & Plan Note (Signed)
Hx of CAD,  takes ASA 81mg  qd

## 2021-11-27 NOTE — Assessment & Plan Note (Signed)
Elevated LFT, normalized AST 16, ALT 17, alk phos 100 06/04/21, off routine Tylenol, RUQ [place], 10/21/20 [place] abd multiple gallstones in the gallbladder, largest 0.8cm.

## 2021-11-27 NOTE — Assessment & Plan Note (Signed)
due to frailty, unsteady of BLE, poor safety awareness.

## 2021-11-27 NOTE — Assessment & Plan Note (Signed)
prn Tylenol, Tramadol.

## 2021-11-27 NOTE — Assessment & Plan Note (Signed)
takes [place], DuoNeb,  prn O2 via [place]

## 2021-11-27 NOTE — Assessment & Plan Note (Signed)
under hospice service, weight has been stabilized.

## 2021-11-27 NOTE — Assessment & Plan Note (Signed)
w/c for mobility, needs assistance with transfer.

## 2021-11-27 NOTE — Assessment & Plan Note (Signed)
takes Levothyroxine, TSH 1.64 04/24/21

## 2021-11-27 NOTE — Assessment & Plan Note (Signed)
Stable,  takes Senokot S II qhs

## 2021-11-27 NOTE — Assessment & Plan Note (Signed)
needs total care, under Hospice service.

## 2021-11-27 NOTE — Assessment & Plan Note (Signed)
chronic, minimal BLE, mostly on top of feet,  takes Torsemide

## 2021-11-27 NOTE — Progress Notes (Signed)
Location:   SNF [place] Room Number: 28 A Place of Service:  SNF (31) Provider: [facility] Noreene Boreman NP  [name], MD  Patient Care Team: [name], MD as PCP - General (Internal Medicine) [place] [place] Kobi Aller X, NP as Nurse Practitioner (Nurse Practitioner)  Extended Emergency Contact Information Primary Emergency Contact: [name] States of [place] Phone: [phone] Relation: Son Secondary Emergency Contact: [place] Phone: [phone] Mobile Phone: [phone] Relation: Daughter  Code Status:  DNR Goals of care: Advanced Directive information Advanced Directives 11/27/2021  Does Patient Have a Medical Advance Directive? Yes  Type of Paramedic of [place];Living will;Out of facility DNR (pink MOST or yellow form)  Does patient want to make changes to medical advance directive? No - Patient declined  Copy of [place] in Chart? Yes - validated most recent copy scanned in chart (See row information)  Would patient like information on creating a medical advance directive? -  Pre-existing out of facility DNR order (yellow form or pink MOST form) Yellow form placed in chart (order not valid for inpatient use);Pink MOST form placed in chart (order not valid for inpatient use)     Chief Complaint  Patient presents with   Medical Management of Chronic Issues    Routine Visit   Quality Metric Gaps    Shingrix    HPI:  Pt is a 87 y.o. female seen today for medical management of chronic diseases.    Adult failure to thrive, under hospice service, weight has been stabilized.              Frequent falls, due to frailty and unsteady of BLE             COVID infection, declined Paxlovid             Gait abnormality, w/c for mobility, needs assistance with transfer.             Memory deficit, needs total care, under Hospice service.               Hypothyroidism, takes Levothyroxine, TSH 1.64 04/24/21             Constipation, takes Senokot S II qhs             COPD, takes Allegra, DuoNeb,  prn O2 via [place]             Edema BLE, chronic, minimal BLE, mostly on top of feet,  takes Torsemide             CKD, Bun/creat 23/1.15 eGFR 37 06/04/21             OA pain, prn Tylenol, Tramadol.              TIA ?, Hx of CAD,  takes ASA 75m qd.              Elevated LFT, normalized AST 16, ALT 17, alk phos 100 06/04/21, off routine Tylenol, RUQ [place] 10/21/20 [place] multiple gallstones in the gallbladder, largest 0.8cm.      Past Medical History:  Diagnosis Date   Abnormal liver function tests    11/11/15 AST 31, ALT 88, alk phos 96    Acute kidney failure, unspecified ([place] 01/24/2012   Arthralgia 09/26/2014   Multiple joints: knees, shoulders, wrists, spine hips    Arthritis    Candidiasis of other urogenital sites 08/10/2012   Cerebral  embolism 05/23/2005   CHF (congestive heart failure) ([place])    Cholelithiasis 11/04/2015   Closed fracture of sacrum and coccyx without mention of spinal cord injury 02/14/2012   Constipation 05/03/2013   Contusion of face, scalp, and neck except eye(s) 06/01/2012   Contusion of wrist 06/01/2012   COPD (chronic obstructive pulmonary disease) ([place])    COPD, mild ([place]) 01/20/2013   Edema 04/20/2012   H/O: CVA (cerebrovascular accident)    Hearing loss 09/26/2014   History of cancer of uterus    HTN (hypertension), benign    Hyperglycemia 11/14/2015   Hypothyroidism    Insomnia, unspecified 08/10/2012   Open wound of knee, leg (except thigh), and ankle, without mention of complication 03/17/8098   Other and unspecified hyperlipidemia 06/01/2012   Other disorder of coccyx 01/27/2012   Pain in joint, ankle and foot 06/14/2012   Peripheral vascular disease, unspecified ([place]) 01/27/2012   Personal history of fall 01/27/2012   Pneumonia, organism unspecified([place]) 01/23/2005   Rheumatic fever 09/26/1933   Age 12 10/01/14 ESR 13,  RAF <10    Seasonal allergies 05/03/2013   Stasis dermatitis of both legs 09/26/2014   Unspecified constipation 11/02/2012   Unspecified hearing loss 02/08/2013   Unspecified hereditary and idiopathic peripheral neuropathy 01/27/2012   Urinary frequency 02/24/2014   Ventricular fibrillation ([place]) 01/24/2012   Past Surgical History:  Procedure Laterality Date   ABDOMINAL HYSTERECTOMY  1990   for endometrial cancer   CATARACT EXTRACTION W/ INTRAOCULAR LENS  IMPLANT, [place]   bilateral   THYROIDECTOMY  1960   TONSILLECTOMY  1916    Allergies  Allergen Reactions   Amoxicillin     Unknown, patient unable to answer questionnaire    Aspirin Other (See Comments)    On MAR   Avelox [Moxifloxacin]     Unknown: listed on MAR   Erythromycin     Unknown: listed on MAR   Monistat [Miconazole]     Unknown: listed on MAR   Morphine And Related     Unknown: listed on MAR   Orange Juice [Orange Oil]     Unknown: listed on MAR    Allergies as of 11/27/2021       Reactions   Amoxicillin    Unknown, patient unable to answer questionnaire    Aspirin Other (See Comments)   On MAR   Avelox [moxifloxacin]    Unknown: listed on MAR   Erythromycin    Unknown: listed on MAR   Monistat [miconazole]    Unknown: listed on MAR   Morphine And Related    Unknown: listed on MAR   Orange Juice [orange Oil]    Unknown: listed on [facility]        Medication List        Accurate as of November 27, 2021 11:59 PM. If you have any questions, ask your nurse or doctor.          aspirin 81 MG chewable tablet Chew 81 mg by mouth daily.   carboxymethylcellulose 0.5 % Soln Commonly known as: REFRESH PLUS Place 1 drop into both eyes 2 (two) times daily.   fexofenadine 60 MG tablet Commonly known as: ALLEGRA Take 60 mg by mouth daily.   ipratropium-albuterol 0.5-2.5 (3) MG/3ML Soln Commonly known as: DUONEB Take 3  mLs by nebulization 2 (two) times daily.   levothyroxine 137 MCG tablet Commonly known as:  SYNTHROID Take 137 mcg by mouth daily before breakfast.   melatonin 3 MG Tabs tablet Take 3 mg by mouth at bedtime.   MILK OF MAGNESIA PO Take 30 mLs by mouth as needed.   OcuSoft Lid Scrub Allergy Pads Place 1 each into both eyes in the morning and at bedtime. Cleanse bilateral lids for excess eye secretions.   potassium chloride 10 MEQ tablet Commonly known as: KLOR-CON M Take 10 mEq by mouth. Mon, Wed, Fri   sennosides-docusate sodium 8.6-50 MG tablet Commonly known as: SENOKOT-S Take 2 tablets by mouth at bedtime.   torsemide 10 MG tablet Commonly known as: DEMADEX Take 10 mg by mouth daily. MON, WED, FRI   traMADol 50 MG tablet Commonly known as: ULTRAM Take 50 mg by mouth every 8 (eight) hours as needed.   zinc oxide 20 % ointment Apply 1 application topically daily as needed for irritation (To buttocks after every incontinent episode and for redness.).        Review of Systems  Unable to perform ROS: Dementia   Immunization History  Administered Date(s) Administered   Influenza Whole 10/19/2012, 09/15/2018   Influenza, High Dose Seasonal PF 09/14/2019   Influenza-Unspecified 10/18/2013, 10/17/2014, 09/02/2015, 09/29/2016, 10/03/2017, 09/23/2020, 09/30/2021   Moderna Sars-Covid-2 Vaccination 12/15/2019, 01/12/2020, 10/21/2020, 05/12/2021, 09/01/2021   PPD Test 01/22/2013, 11/28/2015   Pneumococcal Conjugate-13 11/18/2017   Pneumococcal Polysaccharide-23 01/22/2013   Td 02/21/2009   Tdap 04/07/2015   Pertinent  Health Maintenance Due  Topic Date Due   INFLUENZA VACCINE  Completed   Fall Risk 09/02/2017 09/05/2018 10/12/2020 10/12/2020 07/06/2021  Falls in the past year? No Yes - - -  Was there an injury with Fall? - Yes - - -  Was there an injury with Fall? - - - - -  Patient Fall Risk Level - - High fall risk High fall risk High fall risk  Patient at Risk for  Falls Due to - - - - -   Functional Status Survey:    Vitals:   11/27/21 1439  BP: (!) 119/55  Pulse: 61  Resp: 17  Temp: (!) 96.8 F (36 C)  SpO2: 92%  Weight: 116 lb 4.8 oz (52.8 kg)  Height: 5' (1.524 m)   Body mass index is 22.71 kg/m. Physical Exam Vitals and nursing note reviewed.  Constitutional:      Comments: Sleepy.  HENT:     Head: Normocephalic and atraumatic.     Nose: Nose normal. No congestion or rhinorrhea.     Mouth/Throat:     Mouth: Mucous membranes are moist.  Eyes:     Extraocular Movements: Extraocular movements intact.     Pupils: Pupils are equal, round, and reactive to light.  Cardiovascular:     Rate and Rhythm: Normal rate and regular rhythm.     Heart sounds: Murmur heard.  Pulmonary:     Breath sounds: No rales.  Abdominal:     General: Bowel sounds are normal.     Palpations: Abdomen is soft.     Tenderness: There is no abdominal tenderness.  Musculoskeletal:     Cervical back: Normal range of motion and neck supple.     Right lower leg: Edema present.     Left lower leg: Edema present.     Comments: Trace edema BLE  Skin:    General: Skin is warm and dry.  Neurological:     General: No focal deficit present.     Mental Status: She is alert.  Mental status is at baseline.     Gait: Gait abnormal.     Comments: Oriented to person, place.   Psychiatric:        Mood and Affect: Mood normal.        Behavior: Behavior normal.    Labs reviewed: Recent Labs    02/27/21 0000 06/04/21 0000  NA 146 136*  K 3.6 3.8  CL 106 95*  CO2 28* 31*  BUN 46* 23*  CREATININE 1.2* 1.2*  CALCIUM 8.7 8.7   Recent Labs    02/27/21 0000 06/04/21 0000  AST 299* 16  ALT 299* 17  ALKPHOS 234* 100  ALBUMIN 3.3* 3.2*   Recent Labs    02/27/21 0000  WBC 10.6  NEUTROABS 7,897.00  HGB 13.8  HCT 43  PLT 174   Lab Results  Component Value Date   TSH 1.64 04/24/2021   No results found for: HGBA1C No results found for: CHOL, HDL,  LDLCALC, LDLDIRECT, TRIG, CHOLHDL  Significant Diagnostic Results in last 30 days:  No results found.  Assessment/Plan  Memory deficits  needs total care, under Hospice service.   Hypothyroidism takes Levothyroxine, TSH 1.64 04/24/21  Constipation Stable,  takes Senokot S II qhs  COPD, mild (HCC)  takes Allegra, DuoNeb,  prn O2 via [place]  Edema of both lower legs due to peripheral venous insufficiency chronic, minimal BLE, mostly on top of feet,  takes Torsemide  Chronic kidney disease (CKD), stage III (moderate) Bun/creat 23/1.15 eGFR 37 06/04/21  Arthralgia prn Tylenol, Tramadol.   TIA (transient ischemic attack)  Hx of CAD,  takes ASA 44m qd  Elevated liver enzymes Elevated LFT, normalized AST 16, ALT 17, alk phos 100 06/04/21, off routine Tylenol, RUQ [place] 10/21/20 [place] multiple gallstones in the gallbladder, largest 0.8cm.   Gait instability w/c for mobility, needs assistance with transfer.  Frequent falls due to frailty, unsteady of BLE, poor safety awareness.   Adult failure to thrive under hospice service, weight has been stabilized.    Family/ staff Communication: plan of care reviewed with the patient and charge nurse.   Labs/tests ordered:  none  Time spend 25 minutes.

## 2021-11-27 NOTE — Assessment & Plan Note (Signed)
Bun/creat 23/1.15 eGFR 37 06/04/21 

## 2021-11-28 ENCOUNTER — Encounter: Payer: Self-pay | Admitting: Nurse Practitioner

## 2021-12-25 ENCOUNTER — Non-Acute Institutional Stay (SKILLED_NURSING_FACILITY): Payer: Medicare PPO | Admitting: Nurse Practitioner

## 2021-12-25 ENCOUNTER — Encounter: Payer: Self-pay | Admitting: Nurse Practitioner

## 2021-12-25 DIAGNOSIS — K5900 Constipation, unspecified: Secondary | ICD-10-CM | POA: Diagnosis not present

## 2021-12-25 DIAGNOSIS — E890 Postprocedural hypothyroidism: Secondary | ICD-10-CM | POA: Diagnosis not present

## 2021-12-25 DIAGNOSIS — R600 Localized edema: Secondary | ICD-10-CM

## 2021-12-25 DIAGNOSIS — R2681 Unsteadiness on feet: Secondary | ICD-10-CM

## 2021-12-25 DIAGNOSIS — Z66 Do not resuscitate: Secondary | ICD-10-CM | POA: Diagnosis not present

## 2021-12-25 DIAGNOSIS — R748 Abnormal levels of other serum enzymes: Secondary | ICD-10-CM

## 2021-12-25 DIAGNOSIS — N1832 Chronic kidney disease, stage 3b: Secondary | ICD-10-CM

## 2021-12-25 DIAGNOSIS — J449 Chronic obstructive pulmonary disease, unspecified: Secondary | ICD-10-CM | POA: Diagnosis not present

## 2021-12-25 DIAGNOSIS — R413 Other amnesia: Secondary | ICD-10-CM

## 2021-12-25 DIAGNOSIS — R296 Repeated falls: Secondary | ICD-10-CM

## 2021-12-25 DIAGNOSIS — G459 Transient cerebral ischemic attack, unspecified: Secondary | ICD-10-CM

## 2021-12-25 DIAGNOSIS — R627 Adult failure to thrive: Secondary | ICD-10-CM

## 2021-12-25 DIAGNOSIS — M159 Polyosteoarthritis, unspecified: Secondary | ICD-10-CM

## 2021-12-25 DIAGNOSIS — I872 Venous insufficiency (chronic) (peripheral): Secondary | ICD-10-CM

## 2021-12-25 NOTE — Progress Notes (Signed)
Location:  [place] Room Number: N028-A Place of Service:  SNF (31) Provider:  Marlana Latus, NP   Patient Care Team: [name], MD as PCP - General (Internal Medicine) [place] [place] Jacob Cicero X, NP as Nurse Practitioner (Nurse Practitioner)  Extended Emergency Contact Information Primary Emergency Contact: [name] States of [place] Phone: [phone] Relation: Son Secondary Emergency Contact: [place] Phone: [phone] Mobile Phone: [phone] Relation: Daughter  Code Status:  DNR Goals of care: Advanced Directive information Advanced Directives 12/25/2021  Does Patient Have a Medical Advance Directive? Yes  Type of Paramedic of [place];Living will;Out of facility DNR (pink MOST or yellow form)  Does patient want to make changes to medical advance directive? No - Patient declined  Copy of [place] in Chart? Yes - validated most recent copy scanned in chart (See row information)  Would patient like information on creating a medical advance directive? -  Pre-existing out of facility DNR order (yellow form or pink MOST form) Yellow form placed in chart (order not valid for inpatient use);Pink MOST form placed in chart (order not valid for inpatient use)     Chief Complaint  Patient presents with   Medical Management of Chronic Issues    Routine visit and discuss need for shingrix or post pone if patient refuses     HPI:  Pt is a 87 y.o. female seen today for medical management of chronic diseases.     Adult failure to thrive, under hospice service, weight has been stabilized.              Frequent falls, due to frailty and unsteady of BLE             COVID infection, declined Paxlovid             Gait abnormality, w/c for mobility, needs assistance with transfer.             Memory deficit, needs total care, under Hospice  service.              Hypothyroidism, takes Levothyroxine, TSH 1.64 04/24/21             Constipation, takes Senokot S II qhs, MOM prn             COPD, takes Allegra, DuoNeb,  prn O2 via [place]             Edema BLE, chronic, minimal BLE, mostly on top of feet,  takes Torsemide             CKD, Bun/creat 23/1.15 eGFR 37 06/04/21             OA pain, prn Tylenol, Tramadol.              TIA ?, Hx of CAD,  takes ASA 29m qd.              Elevated LFT, normalized AST 16, ALT 17, alk phos 100 06/04/21, off routine Tylenol, RUQ [place] 10/21/20 [place] multiple gallstones in the gallbladder, largest 0.8cm.       Past Medical History:  Diagnosis Date   Abnormal liver function tests    11/11/15 AST 31, ALT 88, alk phos 96    Acute kidney failure, unspecified ([place] 01/24/2012   Arthralgia 09/26/2014   Multiple joints: knees, shoulders, wrists, spine hips    Arthritis    Candidiasis of other urogenital sites 08/10/2012  Cerebral embolism 05/23/2005   CHF (congestive heart failure) ([place])    Cholelithiasis 11/04/2015   Closed fracture of sacrum and coccyx without mention of spinal cord injury 02/14/2012   Constipation 05/03/2013   Contusion of face, scalp, and neck except eye(s) 06/01/2012   Contusion of wrist 06/01/2012   COPD (chronic obstructive pulmonary disease) (HCC)    COPD, mild ([place]) 01/20/2013   Edema 04/20/2012   H/O: CVA (cerebrovascular accident)    Hearing loss 09/26/2014   History of cancer of uterus    HTN (hypertension), benign    Hyperglycemia 11/14/2015   Hypothyroidism    Insomnia, unspecified 08/10/2012   Open wound of knee, leg (except thigh), and ankle, without mention of complication 08/21/2425   Other and unspecified hyperlipidemia 06/01/2012   Other disorder of coccyx 01/27/2012   Pain in joint, ankle and foot 06/14/2012   Peripheral vascular disease, unspecified ([place]) 01/27/2012   Personal history of fall 01/27/2012   Pneumonia, organism unspecified([place])  01/23/2005   Rheumatic fever 09/26/1933   Age 47 10/01/14 ESR 13, RAF <10    Seasonal allergies 05/03/2013   Stasis dermatitis of both legs 09/26/2014   Unspecified constipation 11/02/2012   Unspecified hearing loss 02/08/2013   Unspecified hereditary and idiopathic peripheral neuropathy 01/27/2012   Urinary frequency 02/24/2014   Ventricular fibrillation ([place]) 01/24/2012   Past Surgical History:  Procedure Laterality Date   ABDOMINAL HYSTERECTOMY  1990   for endometrial cancer   CATARACT EXTRACTION W/ INTRAOCULAR LENS  IMPLANT, BILATER[place]     ECTOPIC PREGNANCY SURGERY  1952   GUM SURGERY  1932   MASTOIDECTOMY  1920   bilateral   THYROIDECTOMY  1960   TONSILLECTOMY  1916    Allergies  Allergen Reactions   Amoxicillin     Unknown, patient unable to answer questionnaire    Aspirin Other (See Comments)    On MAR   Avelox [Moxifloxacin]     Unknown: listed on MAR   Erythromycin     Unknown: listed on MAR   Monistat [Miconazole]     Unknown: listed on MAR   Morphine And Related     Unknown: listed on MAR   Orange Juice [Orange Oil]     Unknown: listed on MAR    Outpatient Encounter Medications as of 12/25/2021  Medication Sig   aspirin 81 MG chewable tablet Chew 81 mg by mouth daily.   carboxymethylcellulose (REFRESH PLUS) 0.5 % SOLN Place 1 drop into both eyes 2 (two) times daily.   Eyelid Cleansers (OCUSOFT LID SCRUB ALLERGY) PADS Place 1 each into both eyes in the morning and at bedtime. Cleanse bilateral lids for excess eye secretions.   fexofenadine (ALLEGRA) 60 MG tablet Take 60 mg by mouth daily.   ipratropium-albuterol (DUONEB) 0.5-2.5 (3) MG/3ML SOLN Take 3 mLs by nebulization 2 (two) times daily as needed.   levothyroxine (SYNTHROID) 137 MCG tablet Take 137 mcg by mouth daily before breakfast.   Magnesium Hydroxide (MILK OF MAGNESIA PO) Take 30 mLs by mouth as needed.   Melatonin 3 MG TABS Take 3 mg by mouth at bedtime.    potassium  chloride (KLOR-CON) 10 MEQ tablet Take 10 mEq by mouth. Mon, Wed, Fri   sennosides-docusate sodium (SENOKOT-S) 8.6-50 MG tablet Take 2 tablets by mouth at bedtime.    torsemide (DEMADEX) 10 MG tablet Take 10 mg by mouth daily. MON, WED, FRI   traMADol (ULTRAM) 50 MG tablet Take 50 mg by mouth every 8 (eight) hours as needed.  zinc oxide 20 % ointment Apply 1 application topically daily as needed for irritation (To buttocks after every incontinent episode and for redness.).    No facility-administered encounter medications on file as of 12/25/2021.    Review of Systems  Unable to perform ROS: Dementia   Immunization History  Administered Date(s) Administered   Influenza Whole 10/19/2012, 09/15/2018   Influenza, High Dose Seasonal PF 09/14/2019   Influenza-Unspecified 10/18/2013, 10/17/2014, 09/02/2015, 09/29/2016, 10/03/2017, 09/23/2020, 09/30/2021   Moderna Sars-Covid-2 Vaccination 12/15/2019, 01/12/2020, 10/21/2020, 05/12/2021, 09/01/2021   PPD Test 01/22/2013, 11/28/2015   Pneumococcal Conjugate-13 11/18/2017   Pneumococcal Polysaccharide-23 01/22/2013   Td 02/21/2009   Tdap 04/07/2015   Pertinent  Health Maintenance Due  Topic Date Due   INFLUENZA VACCINE  Completed   Fall Risk 09/02/2017 09/05/2018 10/12/2020 10/12/2020 07/06/2021  Falls in the past year? No Yes - - -  Was there an injury with Fall? - Yes - - -  Was there an injury with Fall? - - - - -  Patient Fall Risk Level - - High fall risk High fall risk High fall risk  Patient at Risk for Falls Due to - - - - -   Functional Status Survey:    Vitals:   12/25/21 0931  BP: (!) 160/90  Pulse: 88  Resp: 18  Temp: (!) 96.8 F (36 C)  SpO2: 99%  Weight: 114 lb (51.7 kg)  Height: 5' (1.524 m)   Body mass index is 22.26 kg/m. Physical Exam Vitals and nursing note reviewed.  Constitutional:      Comments: Sleepy.  HENT:     Head: Normocephalic and atraumatic.     Nose: Nose normal. No congestion or  rhinorrhea.     Mouth/Throat:     Mouth: Mucous membranes are moist.  Eyes:     Extraocular Movements: Extraocular movements intact.     Pupils: Pupils are equal, round, and reactive to light.  Cardiovascular:     Rate and Rhythm: Normal rate and regular rhythm.     Heart sounds: Murmur heard.  Pulmonary:     Breath sounds: No rales.  Abdominal:     General: Bowel sounds are normal.     Palpations: Abdomen is soft.     Tenderness: There is no abdominal tenderness.  Musculoskeletal:     Cervical back: Normal range of motion and neck supple.     Right lower leg: Edema present.     Left lower leg: Edema present.     Comments: Trace edema BLE  Skin:    General: Skin is warm and dry.  Neurological:     General: No focal deficit present.     Mental Status: She is alert. Mental status is at baseline.     Gait: Gait abnormal.     Comments: Oriented to person, place.   Psychiatric:        Mood and Affect: Mood normal.        Behavior: Behavior normal.    Labs reviewed: Recent Labs    02/27/21 0000 06/04/21 0000  NA 146 136*  K 3.6 3.8  CL 106 95*  CO2 28* 31*  BUN 46* 23*  CREATININE 1.2* 1.2*  CALCIUM 8.7 8.7   Recent Labs    02/27/21 0000 06/04/21 0000  AST 299* 16  ALT 299* 17  ALKPHOS 234* 100  ALBUMIN 3.3* 3.2*   Recent Labs    02/27/21 0000  WBC 10.6  NEUTROABS 7,897.00  HGB 13.8  HCT 43  PLT 174   Lab Results  Component Value Date   TSH 1.64 04/24/2021   No results found for: HGBA1C No results found for: CHOL, HDL, LDLCALC, LDLDIRECT, TRIG, CHOLHDL  Significant Diagnostic Results in last 30 days:  No results found.  Assessment/Plan Hypothyroidism  takes Levothyroxine, TSH 1.64 04/24/21  Constipation Stable, takes Senokot S II qhs, MOM prn  COPD, mild (HCC) Stable, takes Allegra, DuoNeb,  prn O2 via [place]  Edema of both lower legs due to peripheral venous insufficiency chronic, minimal BLE, mostly on top of feet,  takes Torsemide  Chronic  kidney disease (CKD), stage III (moderate) Bun/creat 23/1.15 eGFR 37 06/04/21  Osteoarthritis, multiple sites  prn Tylenol, Tramadol.   TIA (transient ischemic attack) Hx of CAD,  takes ASA 68m qd.   Elevated liver enzymes Elevated LFT, normalized AST 16, ALT 17, alk phos 100 06/04/21, off routine Tylenol, RUQ [place] 10/21/20 [place] multiple gallstones in the gallbladder, largest 0.8cm.   Memory deficits needs total care, under Hospice service.   Gait instability  w/c for mobility, needs assistance with transfer.  Frequent falls  due to frailty and unsteady of BLE  Adult failure to thrive Adult failure to thrive, under hospice service, weight has been stabilized.   Family/ staff Communication: plan of care reviewed with the patient and charge nurese.   Labs/tests ordered:  none  Time spend 25 minutes.

## 2021-12-29 ENCOUNTER — Encounter: Payer: Self-pay | Admitting: Nurse Practitioner

## 2021-12-29 NOTE — Assessment & Plan Note (Signed)
takes Levothyroxine, TSH 1.64 04/24/21

## 2021-12-29 NOTE — Assessment & Plan Note (Signed)
Stable, takes Allegra, DuoNeb,  prn O2 via [place]

## 2021-12-29 NOTE — Assessment & Plan Note (Signed)
w/c for mobility, needs assistance with transfer.

## 2021-12-29 NOTE — Assessment & Plan Note (Signed)
Bun/creat 23/1.15 eGFR 37 06/04/21 

## 2021-12-29 NOTE — Assessment & Plan Note (Signed)
needs total care, under Hospice service.

## 2021-12-29 NOTE — Assessment & Plan Note (Signed)
Hx of CAD,  takes ASA 81mg qd °

## 2021-12-29 NOTE — Assessment & Plan Note (Signed)
Stable, takes Senokot S II qhs, MOM prn

## 2021-12-29 NOTE — Assessment & Plan Note (Signed)
due to frailty and unsteady of BLE

## 2021-12-29 NOTE — Assessment & Plan Note (Signed)
chronic, minimal BLE, mostly on top of feet,  takes Torsemide

## 2021-12-29 NOTE — Assessment & Plan Note (Signed)
Elevated LFT, normalized AST 16, ALT 17, alk phos 100 06/04/21, off routine Tylenol, RUQ [place], 10/21/20 [place] abd multiple gallstones in the gallbladder, largest 0.8cm.

## 2021-12-29 NOTE — Assessment & Plan Note (Signed)
Adult failure to thrive, under hospice service, weight has been stabilized.

## 2021-12-29 NOTE — Assessment & Plan Note (Signed)
prn Tylenol, Tramadol.

## 2022-01-29 ENCOUNTER — Encounter: Payer: Self-pay | Admitting: Nurse Practitioner

## 2022-01-29 ENCOUNTER — Non-Acute Institutional Stay (SKILLED_NURSING_FACILITY): Payer: Medicare PPO | Admitting: Nurse Practitioner

## 2022-01-29 DIAGNOSIS — R296 Repeated falls: Secondary | ICD-10-CM | POA: Diagnosis not present

## 2022-01-29 DIAGNOSIS — N1832 Chronic kidney disease, stage 3b: Secondary | ICD-10-CM

## 2022-01-29 DIAGNOSIS — R2681 Unsteadiness on feet: Secondary | ICD-10-CM

## 2022-01-29 DIAGNOSIS — M159 Polyosteoarthritis, unspecified: Secondary | ICD-10-CM

## 2022-01-29 DIAGNOSIS — G459 Transient cerebral ischemic attack, unspecified: Secondary | ICD-10-CM

## 2022-01-29 DIAGNOSIS — K8018 Calculus of gallbladder with other cholecystitis without obstruction: Secondary | ICD-10-CM

## 2022-01-29 DIAGNOSIS — I739 Peripheral vascular disease, unspecified: Secondary | ICD-10-CM

## 2022-01-29 DIAGNOSIS — R627 Adult failure to thrive: Secondary | ICD-10-CM | POA: Diagnosis not present

## 2022-01-29 DIAGNOSIS — E890 Postprocedural hypothyroidism: Secondary | ICD-10-CM

## 2022-01-29 DIAGNOSIS — J449 Chronic obstructive pulmonary disease, unspecified: Secondary | ICD-10-CM

## 2022-01-29 DIAGNOSIS — K5900 Constipation, unspecified: Secondary | ICD-10-CM

## 2022-01-29 DIAGNOSIS — R413 Other amnesia: Secondary | ICD-10-CM

## 2022-01-29 NOTE — Assessment & Plan Note (Signed)
Increased swelling, mild erythema, warmth, no tenderness palpated in calves and BLE, no open areas, will increase Torsemide 10mg  5 days/wk with K. Observe for s/s of infection.

## 2022-01-29 NOTE — Progress Notes (Signed)
Location:  [facility] Room Number: [place] of Service:  SNF (31) Provider: Leylani Duley Otho Darner, NP  Patient Care Team: [name], MD as PCP - General (Internal Medicine) [place], [place], [facility] Dax Murguia X, NP as Nurse Practitioner (Nurse Practitioner)  Extended Emergency Contact Information Primary Emergency Contact: [name] States of [place] Phone: [phone] Relation: Son Secondary Emergency Contact: [place] Phone: [phone] Mobile Phone: [phone] Relation: Daughter  Code Status:  DNR Goals of care: Advanced Directive information Advanced Directives 01/29/2022  Does Patient Have a Medical Advance Directive? Yes  Type of Paramedic of [place];Living will;Out of facility DNR (pink MOST or yellow form)  Does patient want to make changes to medical advance directive? No - Patient declined  Copy of [place] in Chart? Yes - validated most recent copy scanned in chart (See row information)  Would patient like information on creating a medical advance directive? -  Pre-existing out of facility DNR order (yellow form or pink MOST form) Yellow form placed in chart (order not valid for inpatient use);Pink MOST form placed in chart (order not valid for inpatient use)     Chief Complaint  Patient presents with   Acute Visit    Staff reported possible cellulitis BLE    HPI:  Pt is a 88 y.o. female seen today for an acute visit for reported ? Cellulitis of BLE. Noted increased swelling BLE, mild erythema and slightly warm mid of BLE, no pain in calves noted, no open areas BLE.   Adult failure to thrive, under hospice service             Frequent falls, due to frailty and unsteady of BLE             COVID infection, declined Paxlovid             Gait abnormality, w/c for mobility, needs assistance with transfer.             Memory deficit, needs  total care, under Hospice service.              Hypothyroidism, takes Levothyroxine, TSH 1.64 04/24/21             Constipation, takes Senokot S II qhs, MOM prn             COPD, takes Allegra, DuoNeb,  prn O2 via [place]             Edema BLE, chronic, worsening, takes Torsemide             CKD, Bun/creat 23/1.15 eGFR 37 06/04/21             OA pain, prn Tylenol, Tramadol.              TIA ?, Hx of CAD,  takes ASA 26m qd.              Elevated LFT, normalized AST 16, ALT 17, alk phos 100 06/04/21, off routine Tylenol, RUQ [place] 10/21/20 [place] multiple gallstones in the gallbladder, largest 0.8cm.     Past Medical History:  Diagnosis Date   Abnormal liver function tests    11/11/15 AST 31, ALT 88, alk phos 96    Acute kidney failure, unspecified ([place] 01/24/2012   Arthralgia 09/26/2014   Multiple joints: knees, shoulders, wrists, spine hips    Arthritis    Candidiasis of other urogenital sites 08/10/2012   Cerebral embolism  05/23/2005   CHF (congestive heart failure) ([place])    Cholelithiasis 11/04/2015   Closed fracture of sacrum and coccyx without mention of spinal cord injury 02/14/2012   Constipation 05/03/2013   Contusion of face, scalp, and neck except eye(s) 06/01/2012   Contusion of wrist 06/01/2012   COPD (chronic obstructive pulmonary disease) ([place])    COPD, mild ([place]) 01/20/2013   Edema 04/20/2012   H/O: CVA (cerebrovascular accident)    Hearing loss 09/26/2014   History of cancer of uterus    HTN (hypertension), benign    Hyperglycemia 11/14/2015   Hypothyroidism    Insomnia, unspecified 08/10/2012   Open wound of knee, leg (except thigh), and ankle, without mention of complication 03/20/164   Other and unspecified hyperlipidemia 06/01/2012   Other disorder of coccyx 01/27/2012   Pain in joint, ankle and foot 06/14/2012   Peripheral vascular disease, unspecified ([place]) 01/27/2012   Personal history of fall 01/27/2012   Pneumonia, organism unspecified([place]) 01/23/2005   Rheumatic fever 09/26/1933    Age 52 10/01/14 ESR 13, RAF <10    Seasonal allergies 05/03/2013   Stasis dermatitis of both legs 09/26/2014   Unspecified constipation 11/02/2012   Unspecified hearing loss 02/08/2013   Unspecified hereditary and idiopathic peripheral neuropathy 01/27/2012   Urinary frequency 02/24/2014   Ventricular fibrillation ([place]) 01/24/2012   Past Surgical History:  Procedure Laterality Date   ABDOMINAL HYSTERECTOMY  1990   for endometrial cancer   CATARACT EXTRACTION W/ INTRAOCULAR LENS  IMPLANT, BILATERAL     ECTOPIC PREGNANCY SURGERY  1952   GUM SURGERY  1932   MASTOIDECTOMY  1920   bilateral   THYROIDECTOMY  1960   TONSILLECTOMY  1916    Allergies  Allergen Reactions   Amoxicillin     Unknown, patient unable to answer questionnaire    Aspirin Other (See Comments)    On MAR   Avelox [Moxifloxacin]     Unknown: listed on MAR   Erythromycin     Unknown: listed on MAR   Monistat [Miconazole]     Unknown: listed on MAR   Morphine And Related     Unknown: listed on MAR   Orange Juice [Orange Oil]     Unknown: listed on MAR    Outpatient Encounter Medications as of 01/29/2022  Medication Sig   aspirin 81 MG chewable tablet Chew 81 mg by mouth daily.   carboxymethylcellulose (REFRESH PLUS) 0.5 % SOLN Place 1 drop into both eyes 2 (two) times daily.   Eyelid Cleansers (OCUSOFT LID SCRUB ALLERGY) PADS Place 1 each into both eyes in the morning and at bedtime. Cleanse bilateral lids for excess eye secretions.   fexofenadine (ALLEGRA) 60 MG tablet Take 60 mg by mouth daily.   ipratropium-albuterol (DUONEB) 0.5-2.5 (3) MG/3ML SOLN Take 3 mLs by nebulization 2 (two) times daily as needed.   levothyroxine (SYNTHROID) 137 MCG tablet Take 137 mcg by mouth daily before breakfast.   Magnesium Hydroxide (MILK OF MAGNESIA PO) Take 30 mLs by mouth as needed.   Melatonin 3 MG TABS Take 3 mg by mouth at bedtime.    potassium chloride (KLOR-CON) 10 MEQ tablet Take 10 mEq by mouth. Mon, Wed, Fri    sennosides-docusate sodium (SENOKOT-S) 8.6-50 MG tablet Take 2 tablets by mouth at bedtime.    torsemide (DEMADEX) 10 MG tablet Take 10 mg by mouth daily. MON, WED, FRI   traMADol (ULTRAM) 50 MG tablet Take 50 mg by mouth every 8 (eight) hours as needed.  zinc oxide 20 % ointment Apply 1 application topically daily as needed for irritation (To buttocks after every incontinent episode and for redness.).    No facility-administered encounter medications on file as of 01/29/2022.    Review of Systems  Unable to perform ROS: Dementia   Immunization History  Administered Date(s) Administered   Influenza Whole 10/19/2012, 09/15/2018   Influenza, High Dose Seasonal PF 09/14/2019   Influenza-Unspecified 10/18/2013, 10/17/2014, 09/02/2015, 09/29/2016, 10/03/2017, 09/23/2020, 09/30/2021   Moderna Sars-Covid-2 Vaccination 12/15/2019, 01/12/2020, 10/21/2020, 05/12/2021, 09/01/2021   PPD Test 01/22/2013, 11/28/2015   Pneumococcal Conjugate-13 11/18/2017   Pneumococcal Polysaccharide-23 01/22/2013   Td 02/21/2009   Tdap 04/07/2015   Pertinent  Health Maintenance Due  Topic Date Due   INFLUENZA VACCINE  Completed   Fall Risk 09/02/2017 09/05/2018 10/12/2020 10/12/2020 07/06/2021  Falls in the past year? No Yes - - -  Was there an injury with Fall? - Yes - - -  Was there an injury with Fall? - - - - -  Patient Fall Risk Level - - High fall risk High fall risk High fall risk  Patient at Risk for Falls Due to - - - - -   Functional Status Survey:    Vitals:   01/29/22 1449  BP: (!) 146/72  Pulse: 74  Resp: 16  Temp: (!) 97.3 F (36.3 C)  SpO2: 90%  Weight: 118 lb 4.8 oz (53.7 kg)  Height: 5' (1.524 m)   Body mass index is 23.1 kg/m. Physical Exam Vitals and nursing note reviewed.  Constitutional:      Comments: Sleepy.  HENT:     Head: Normocephalic and atraumatic.     Nose: Nose normal. No congestion or rhinorrhea.     Mouth/Throat:     Mouth: Mucous membranes are moist.  Eyes:      Extraocular Movements: Extraocular movements intact.     Pupils: Pupils are equal, round, and reactive to light.  Cardiovascular:     Rate and Rhythm: Normal rate and regular rhythm.     Heart sounds: Murmur heard.  Pulmonary:     Breath sounds: No rales.  Abdominal:     General: Bowel sounds are normal.     Palpations: Abdomen is soft.     Tenderness: There is no abdominal tenderness.  Musculoskeletal:     Cervical back: Normal range of motion and neck supple.     Right lower leg: Edema present.     Left lower leg: Edema present.     Comments: 1-2+ edema BLE, weight gained about #4 Ibs in the past month.   Skin:    General: Skin is warm and dry.     Findings: Erythema present.     Comments: Mild erythema mid BLE, slightly warmth, no open area or tenderness in calves.   Neurological:     General: No focal deficit present.     Mental Status: She is alert. Mental status is at baseline.     Gait: Gait abnormal.     Comments: Oriented to person, place.   Psychiatric:        Mood and Affect: Mood normal.        Behavior: Behavior normal.    Labs reviewed: Recent Labs    02/27/21 0000 06/04/21 0000  NA 146 136*  K 3.6 3.8  CL 106 95*  CO2 28* 31*  BUN 46* 23*  CREATININE 1.2* 1.2*  CALCIUM 8.7 8.7   Recent Labs    02/27/21 0000 06/04/21  0000  AST 299* 16  ALT 299* 17  ALKPHOS 234* 100  ALBUMIN 3.3* 3.2*   Recent Labs    02/27/21 0000  WBC 10.6  NEUTROABS 7,897.00  HGB 13.8  HCT 43  PLT 174   Lab Results  Component Value Date   TSH 1.64 04/24/2021   No results found for: HGBA1C No results found for: CHOL, HDL, LDLCALC, LDLDIRECT, TRIG, CHOLHDL  Significant Diagnostic Results in last 30 days:  No results found.  Assessment/Plan PVD (peripheral vascular disease) (HCC) Increased swelling, mild erythema, warmth, no tenderness palpated in calves and BLE, no open areas, will increase Torsemide 107m 5 days/wk with K. Observe for s/s of infection.    Adult failure to thrive Supportive care, under Hospice service.   Frequent falls  due to frailty and unsteady of BLE  Gait instability w/c for mobility, needs assistance with transfer.  Memory deficits No behavioral issues.   Hypothyroidism  takes Levothyroxine, TSH 1.64 04/24/21  Constipation takes Senokot S II qhs, MOM prn  COPD, mild (HCC)  takes Allegra, DuoNeb,  prn O2 via [place]  Chronic kidney disease (CKD), stage III (moderate) Bun/creat 23/1.15 eGFR 37 06/04/21  Osteoarthritis, multiple sites prn Tylenol, Tramadol.   TIA (transient ischemic attack)  ?, Hx of CAD,  takes ASA 835mqd.   Cholelithiasis  normalized AST 16, ALT 17, alk phos 100 06/04/21, off routine Tylenol, RUQ [place]/9/21 [place] multiple gallstones in the gallbladder, largest 0.8cm.       Family/ staff Communication: plan of care reviewed with the patient and charge nurse.   Labs/tests ordered:  none  Time spend 35 minutes.

## 2022-02-01 ENCOUNTER — Encounter: Payer: Self-pay | Admitting: Nurse Practitioner

## 2022-02-01 NOTE — Assessment & Plan Note (Signed)
?,   Hx of CAD,  takes ASA 81mg qd °

## 2022-02-01 NOTE — Assessment & Plan Note (Signed)
takes Levothyroxine, TSH 1.64 04/24/21

## 2022-02-01 NOTE — Assessment & Plan Note (Signed)
w/c for mobility, needs assistance with transfer.

## 2022-02-01 NOTE — Assessment & Plan Note (Signed)
Bun/creat 23/1.15 eGFR 37 06/04/21 

## 2022-02-01 NOTE — Assessment & Plan Note (Signed)
due to frailty and unsteady of BLE °

## 2022-02-01 NOTE — Assessment & Plan Note (Signed)
normalized AST 16, ALT 17, alk phos 100 06/04/21, off routine Tylenol, RUQ [place], 10/21/20 [place] abd multiple gallstones in the gallbladder, largest 0.8cm.

## 2022-02-01 NOTE — Assessment & Plan Note (Signed)
prn Tylenol, Tramadol.

## 2022-02-01 NOTE — Assessment & Plan Note (Signed)
takes Allegra, DuoNeb,  prn O2 via [place]

## 2022-02-01 NOTE — Assessment & Plan Note (Signed)
No behavioral issues.  

## 2022-02-01 NOTE — Assessment & Plan Note (Signed)
takes Senokot S II qhs, MOM prn

## 2022-02-01 NOTE — Assessment & Plan Note (Signed)
Supportive care, under Hospice service.

## 2022-02-02 ENCOUNTER — Encounter: Payer: Self-pay | Admitting: Internal Medicine

## 2022-02-02 ENCOUNTER — Non-Acute Institutional Stay (SKILLED_NURSING_FACILITY): Admitting: Internal Medicine

## 2022-02-02 DIAGNOSIS — J449 Chronic obstructive pulmonary disease, unspecified: Secondary | ICD-10-CM | POA: Diagnosis not present

## 2022-02-02 DIAGNOSIS — N1832 Chronic kidney disease, stage 3b: Secondary | ICD-10-CM | POA: Diagnosis not present

## 2022-02-02 DIAGNOSIS — G459 Transient cerebral ischemic attack, unspecified: Secondary | ICD-10-CM

## 2022-02-02 DIAGNOSIS — E890 Postprocedural hypothyroidism: Secondary | ICD-10-CM | POA: Diagnosis not present

## 2022-02-02 DIAGNOSIS — R600 Localized edema: Secondary | ICD-10-CM

## 2022-02-02 NOTE — Progress Notes (Signed)
Location:   [facility] Room Number: 28 Place of Service:  SNF [phone]) Provider:  Veleta Miners MD  [name], MD  Patient Care Team: [name], MD as PCP - General (Internal Medicine) [place], [place], [facility] Mast, Man X, NP as Nurse Practitioner (Nurse Practitioner)  Extended Emergency Contact Information Primary Emergency Contact: [name] States of [place] Phone: [phone] Relation: Son Secondary Emergency Contact: [place] Phone: [phone] Mobile Phone: [phone] Relation: Daughter  Code Status:  DNR Hospice Managed Care Goals of care: Advanced Directive information Advanced Directives 02/02/2022  Does Patient Have a Medical Advance Directive? Yes  Type of Paramedic of [place];Living will;Out of facility DNR (pink MOST or yellow form)  Does patient want to make changes to medical advance directive? No - Patient declined  Copy of [place] in Chart? Yes - validated most recent copy scanned in chart (See row information)  Would patient like information on creating a medical advance directive? -  Pre-existing out of facility DNR order (yellow form or pink MOST form) Yellow form placed in chart (order not valid for inpatient use);Pink MOST form placed in chart (order not valid for inpatient use)     Chief Complaint  Patient presents with   Medical Management of Chronic Issues   Quality Metric Gaps    Shingrix    HPI:  Pt is a 88 y.o. female seen today for medical management of chronic diseases.     Patient has h/o Bilateral LE edema , Hypertension, Hard of hearing, h/o Cellulitis, Hypothyroidism, COPD, Chronic Conjunctivitis  has h/o Gall stones with Hepatitis   Recently seen for Worsening LE swelling Torsemide increased Weight up by 4 pounds  Otherwise She is stable. No new Nursing issues. No Behavior issues  No Falls Wt  Readings from Last 3 Encounters:  02/02/22 118 lb 4.8 oz (53.7 kg)  01/29/22 118 lb 4.8 oz (53.7 kg)  12/25/21 114 lb (51.7 kg)   Past Medical History:  Diagnosis Date   Abnormal liver function tests    11/11/15 AST 31, ALT 88, alk phos 96    Acute kidney failure, unspecified ([place]) 01/24/2012   Arthralgia 09/26/2014   Multiple joints: knees, shoulders, wrists, spine hips    Arthritis    Candidiasis of other urogenital sites 08/10/2012   Cerebral embolism 05/23/2005   CHF (congestive heart failure) ([place])    Cholelithiasis 11/04/2015   Closed fracture of sacrum and coccyx without mention of spinal cord injury 02/14/2012   Constipation 05/03/2013   Contusion of face, scalp, and neck except eye(s) 06/01/2012   Contusion of wrist 06/01/2012   COPD (chronic obstructive pulmonary disease) (HCC)    COPD, mild ([place]) 01/20/2013   Edema 04/20/2012   H/O: CVA (cerebrovascular accident)    Hearing loss 09/26/2014   History of cancer of uterus    HTN (hypertension), benign    Hyperglycemia 11/14/2015   Hypothyroidism    Insomnia, unspecified 08/10/2012   Open wound of knee, leg (except thigh), and ankle, without mention of complication 07/19/6810   Other and unspecified hyperlipidemia 06/01/2012   Other disorder of coccyx 01/27/2012   Pain in joint, ankle and foot 06/14/2012   Peripheral vascular disease, unspecified ([place]) 01/27/2012   Personal history of fall 01/27/2012   Pneumonia, organism unspecified([place]) 01/23/2005   Rheumatic fever 09/26/1933   Age 59 10/01/14 ESR 13, RAF <10    Seasonal allergies 05/03/2013   Stasis  dermatitis of both legs 09/26/2014   Unspecified constipation 11/02/2012   Unspecified hearing loss 02/08/2013   Unspecified hereditary and idiopathic peripheral neuropathy 01/27/2012   Urinary frequency 02/24/2014   Ventricular fibrillation ([place]) 01/24/2012   Past Surgical History:  Procedure Laterality Date   ABDOMINAL HYSTERECTOMY  1990   for endometrial cancer   CATARACT EXTRACTION  W/ INTRAOCULAR LENS  IMPLANT, BILATERAL     ECTOPIC PREGNANCY SURGERY  1952   GUM SURGERY  1932   MASTOIDECTOMY  1920   bilateral   THYROIDECTOMY  1960   TONSILLECTOMY  1916    Allergies  Allergen Reactions   Amoxicillin     Unknown, patient unable to answer questionnaire    Aspirin Other (See Comments)    On MAR   Avelox [Moxifloxacin]     Unknown: listed on MAR   Erythromycin     Unknown: listed on MAR   Monistat [Miconazole]     Unknown: listed on MAR   Morphine And Related     Unknown: listed on MAR   Orange Juice [Orange Oil]     Unknown: listed on MAR    Allergies as of 02/02/2022       Reactions   Amoxicillin    Unknown, patient unable to answer questionnaire    Aspirin Other (See Comments)   On MAR   Avelox [moxifloxacin]    Unknown: listed on MAR   Erythromycin    Unknown: listed on MAR   Monistat [miconazole]    Unknown: listed on MAR   Morphine And Related    Unknown: listed on MAR   Orange Juice [orange Oil]    Unknown: listed on [facility]        Medication List        Accurate as of February 02, 2022 11:44 AM. If you have any questions, ask your nurse or doctor.          aspirin 81 MG chewable tablet Chew 81 mg by mouth daily.   carboxymethylcellulose 0.5 % Soln Commonly known as: REFRESH PLUS Place 1 drop into both eyes 2 (two) times daily.   fexofenadine 60 MG tablet Commonly known as: ALLEGRA Take 60 mg by mouth daily.   ipratropium-albuterol 0.5-2.5 (3) MG/3ML Soln Commonly known as: DUONEB Take 3 mLs by nebulization 2 (two) times daily as needed.   levothyroxine 137 MCG tablet Commonly known as: SYNTHROID Take 137 mcg by mouth daily before breakfast.   melatonin 3 MG Tabs tablet Take 3 mg by mouth at bedtime.   MILK OF MAGNESIA PO Take 30 mLs by mouth as needed.   OcuSoft Lid Scrub Allergy Pads Place 1 each into both eyes in the morning and at bedtime. Cleanse bilateral lids for excess eye secretions.   potassium  chloride 10 MEQ tablet Commonly known as: KLOR-CON M Take 10 mEq by mouth. Mon, Wed, Fri   sennosides-docusate sodium 8.6-50 MG tablet Commonly known as: SENOKOT-S Take 2 tablets by mouth at bedtime.   torsemide 10 MG tablet Commonly known as: DEMADEX Take 10 mg by mouth. Once A Day on Mon, Tue, Wed, Thu, Fri   traMADol 50 MG tablet Commonly known as: ULTRAM Take 50 mg by mouth every 8 (eight) hours as needed.   zinc oxide 20 % ointment Apply 1 application topically daily as needed for irritation (To buttocks after every incontinent episode and for redness.).        Review of Systems  Unable to perform ROS: Other  HOH  Immunization History  Administered Date(s) Administered   Influenza Whole 10/19/2012, 09/15/2018   Influenza, High Dose Seasonal PF 09/14/2019   Influenza-Unspecified 10/18/2013, 10/17/2014, 09/02/2015, 09/29/2016, 10/03/2017, 09/23/2020, 09/30/2021   Moderna Sars-Covid-2 Vaccination 12/15/2019, 01/12/2020, 10/21/2020, 05/12/2021, 09/01/2021   PPD Test 01/22/2013, 11/28/2015   Pneumococcal Conjugate-13 11/18/2017   Pneumococcal Polysaccharide-23 01/22/2013   Td 02/21/2009   Tdap 04/07/2015   Pertinent  Health Maintenance Due  Topic Date Due   INFLUENZA VACCINE  Completed   Fall Risk 09/02/2017 09/05/2018 10/12/2020 10/12/2020 07/06/2021  Falls in the past year? No Yes - - -  Was there an injury with Fall? - Yes - - -  Was there an injury with Fall? - - - - -  Patient Fall Risk Level - - High fall risk High fall risk High fall risk  Patient at Risk for Falls Due to - - - - -   Functional Status Survey:    Vitals:   02/02/22 1139  BP: (!) 155/82  Pulse: 90  Resp: 16  Temp: 97.8 F (36.6 C)  SpO2: 93%  Weight: 118 lb 4.8 oz (53.7 kg)  Height: 5' (1.524 m)   Body mass index is 23.1 kg/m. Physical Exam Vitals reviewed.  Constitutional:      Appearance: Normal appearance.  HENT:     Head: Normocephalic.     Nose: Nose normal.      Mouth/Throat:     Mouth: Mucous membranes are moist.     Pharynx: Oropharynx is clear.  Eyes:     Pupils: Pupils are equal, round, and reactive to light.  Cardiovascular:     Rate and Rhythm: Normal rate and regular rhythm.     Pulses: Normal pulses.     Heart sounds: Normal heart sounds. No murmur heard. Pulmonary:     Effort: Pulmonary effort is normal.     Breath sounds: Normal breath sounds.  Abdominal:     General: Abdomen is flat. Bowel sounds are normal.     Palpations: Abdomen is soft.  Musculoskeletal:        General: Swelling present.     Cervical back: Neck supple.  Skin:    General: Skin is warm.  Neurological:     General: No focal deficit present.     Mental Status: She is alert.  Psychiatric:        Mood and Affect: Mood normal.        Thought Content: Thought content normal.    Labs reviewed: Recent Labs    02/27/21 0000 06/04/21 0000  NA 146 136*  K 3.6 3.8  CL 106 95*  CO2 28* 31*  BUN 46* 23*  CREATININE 1.2* 1.2*  CALCIUM 8.7 8.7   Recent Labs    02/27/21 0000 06/04/21 0000  AST 299* 16  ALT 299* 17  ALKPHOS 234* 100  ALBUMIN 3.3* 3.2*   Recent Labs    02/27/21 0000  WBC 10.6  NEUTROABS 7,897.00  HGB 13.8  HCT 43  PLT 174   Lab Results  Component Value Date   TSH 1.64 04/24/2021   No results found for: HGBA1C No results found for: CHOL, HDL, LDLCALC, LDLDIRECT, TRIG, CHOLHDL  Significant Diagnostic Results in last 30 days:  No results found.  Assessment/Plan 1. Bilateral leg edema Torsemide increased Continue to monitor  2. Postoperative hypothyroidism TSH normal in 5/22  3. Stage 3b chronic kidney disease (HCC) Creat stable  4. COPD, mild ([place]) On [place] PRN  5. TIA (transient ischemic  attack) Continue Aspirin  Enrolled in Hospice    Family/ staff Communication:   Labs/tests ordered:

## 2022-02-19 ENCOUNTER — Non-Acute Institutional Stay (SKILLED_NURSING_FACILITY): Payer: Medicare PPO | Admitting: Orthopedic Surgery

## 2022-02-19 ENCOUNTER — Encounter: Payer: Self-pay | Admitting: Orthopedic Surgery

## 2022-02-19 DIAGNOSIS — R2681 Unsteadiness on feet: Secondary | ICD-10-CM | POA: Diagnosis not present

## 2022-02-19 DIAGNOSIS — R627 Adult failure to thrive: Secondary | ICD-10-CM

## 2022-02-19 DIAGNOSIS — J449 Chronic obstructive pulmonary disease, unspecified: Secondary | ICD-10-CM | POA: Diagnosis not present

## 2022-02-19 DIAGNOSIS — R600 Localized edema: Secondary | ICD-10-CM

## 2022-02-19 NOTE — Progress Notes (Signed)
Location:  [place] Room Number: [place] of Service:  SNF [phone]) Provider: Yvonna Alanis, NP  Patient Care Team: [name], MD as PCP - General (Internal Medicine) [place], Man X, NP as Nurse Practitioner (Nurse Practitioner)  Extended Emergency Contact Information Primary Emergency Contact: [name] States of [place] Phone: [phone] Relation: Son Secondary Emergency Contact: [place] Phone: [phone] Mobile Phone: [phone] Relation: Daughter  Code Status:  DNR Goals of care: Advanced Directive information Advanced Directives 02/19/2022  Does Patient Have a Medical Advance Directive? Yes  Type of Paramedic of [place];Living will;Out of facility DNR (pink MOST or yellow form)  Does patient want to make changes to medical advance directive? No - Patient declined  Copy of [place] in Chart? Yes - validated most recent copy scanned in chart (See row information)  Would patient like information on creating a medical advance directive? -  Pre-existing out of facility DNR order (yellow form or pink MOST form) Yellow form placed in chart (order not valid for inpatient use);Pink MOST form placed in chart (order not valid for inpatient use)     Chief Complaint  Patient presents with   Acute Visit    cough    HPI:  Pt is a 88 y.o. female seen today for an acute visit for cough.   Nursing reports mild cough this morning. Mild crackles on exam. She has not received her morning torsemide yet. Covid test negative. She is followed by hospice due to advanced age/adult failure to thrive. She is a poor historian due to [facility]. She communicates with a dry erase board. She shakes her head no when asked if she is short of breath. Vitals stable, O2 sats > 90%, remains on RA.   02/21 she was seen for worsening BLE, weight increase of 4  lbs noted. Weight improved since increased torsemide, down 2 lbs today.   Past Medical History:  Diagnosis Date   Abnormal liver function tests    11/11/15 AST 31, ALT 88, alk phos 96    Acute kidney failure, unspecified ([place]) 01/24/2012   Arthralgia 09/26/2014   Multiple joints: knees, shoulders, wrists, spine hips    Arthritis    Candidiasis of other urogenital sites 08/10/2012   Cerebral embolism 05/23/2005   CHF (congestive heart failure) ([place])    Cholelithiasis 11/04/2015   Closed fracture of sacrum and coccyx without mention of spinal cord injury 02/14/2012   Constipation 05/03/2013   Contusion of face, scalp, and neck except eye(s) 06/01/2012   Contusion of wrist 06/01/2012   COPD (chronic obstructive pulmonary disease) (HCC)    COPD, mild ([place]) 01/20/2013   Edema 04/20/2012   H/O: CVA (cerebrovascular accident)    Hearing loss 09/26/2014   History of cancer of uterus    HTN (hypertension), benign    Hyperglycemia 11/14/2015   Hypothyroidism    Insomnia, unspecified 08/10/2012   Open wound of knee, leg (except thigh), and ankle, without mention of complication 06/20/4800   Other and unspecified hyperlipidemia 06/01/2012   Other disorder of coccyx 01/27/2012   Pain in joint, ankle and foot 06/14/2012   Peripheral vascular disease, unspecified ([place]) 01/27/2012   Personal history of fall 01/27/2012   Pneumonia, organism unspecified([place]) 01/23/2005   Rheumatic fever 09/26/1933   Age 69 10/01/14 ESR 13, RAF <10    Seasonal allergies 05/03/2013   Stasis dermatitis of both legs 09/26/2014  Unspecified constipation 11/02/2012   Unspecified hearing loss 02/08/2013   Unspecified hereditary and idiopathic peripheral neuropathy 01/27/2012   Urinary frequency 02/24/2014   Ventricular fibrillation ([place]) 01/24/2012   Past Surgical History:  Procedure Laterality Date   ABDOMINAL HYSTERECTOMY  1990   for endometrial cancer   CATARACT EXTRACTION W/ INTRAOCULAR LENS  IMPLANT, BILATERAL     ECTOPIC  PREGNANCY SURGERY  1952   GUM SURGERY  1932   MASTOIDECTOMY  1920   bilateral   THYROIDECTOMY  1960   TONSILLECTOMY  1916    Allergies  Allergen Reactions   Amoxicillin     Unknown, patient unable to answer questionnaire    Aspirin Other (See Comments)    On MAR   Avelox [Moxifloxacin]     Unknown: listed on MAR   Erythromycin     Unknown: listed on MAR   Monistat [Miconazole]     Unknown: listed on MAR   Morphine And Related     Unknown: listed on MAR   Orange Juice [Orange Oil]     Unknown: listed on MAR    Outpatient Encounter Medications as of 02/19/2022  Medication Sig   aspirin 81 MG chewable tablet Chew 81 mg by mouth daily.   carboxymethylcellulose (REFRESH PLUS) 0.5 % SOLN Place 1 drop into both eyes 2 (two) times daily.   Eyelid Cleansers (OCUSOFT LID SCRUB ALLERGY) PADS Place 1 each into both eyes in the morning and at bedtime. Cleanse bilateral lids for excess eye secretions.   fexofenadine (ALLEGRA) 60 MG tablet Take 60 mg by mouth daily.   levothyroxine (SYNTHROID) 137 MCG tablet Take 137 mcg by mouth daily before breakfast.   Magnesium Hydroxide (MILK OF MAGNESIA PO) Take 30 mLs by mouth as needed.   Melatonin 3 MG TABS Take 3 mg by mouth at bedtime.    potassium chloride (KLOR-CON) 10 MEQ tablet Take 10 mEq by mouth. Mon, Tues, Wed, Thurs, and Fri   sennosides-docusate sodium (SENOKOT-S) 8.6-50 MG tablet Take 2 tablets by mouth at bedtime.    torsemide (DEMADEX) 10 MG tablet Take 10 mg by mouth. Once A Day on Mon, Tue, Wed, Thu, Fri   traMADol (ULTRAM) 50 MG tablet Take 50 mg by mouth every 8 (eight) hours as needed.   [DISCONTINUED] ipratropium-albuterol (DUONEB) 0.5-2.5 (3) MG/3ML SOLN Take 3 mLs by nebulization 2 (two) times daily as needed. (Patient not taking: Reported on 02/19/2022)   [DISCONTINUED] zinc oxide 20 % ointment Apply 1 application topically daily as needed for irritation (To buttocks after every incontinent episode and for redness.).  (Patient  not taking: Reported on 02/19/2022)   No facility-administered encounter medications on file as of 02/19/2022.    Review of Systems  Unable to perform ROS: Other (HOH)   Immunization History  Administered Date(s) Administered   Influenza Whole 10/19/2012, 09/15/2018   Influenza, High Dose Seasonal PF 09/14/2019   Influenza-Unspecified 10/18/2013, 10/17/2014, 09/02/2015, 09/29/2016, 10/03/2017, 09/23/2020, 09/30/2021   Moderna Sars-Covid-2 Vaccination 12/15/2019, 01/12/2020, 10/21/2020, 05/12/2021, 09/01/2021   PPD Test 01/22/2013, 11/28/2015   Pneumococcal Conjugate-13 11/18/2017   Pneumococcal Polysaccharide-23 01/22/2013   Td 02/21/2009   Tdap 04/07/2015   Pertinent  Health Maintenance Due  Topic Date Due   INFLUENZA VACCINE  Completed   Fall Risk 09/02/2017 09/05/2018 10/12/2020 10/12/2020 07/06/2021  Falls in the past year? No Yes - - -  Was there an injury with Fall? - Yes - - -  Was there an injury with Fall? - - - - -  Patient Fall Risk Level - - High fall risk High fall risk High fall risk  Patient at Risk for Falls Due to - - - - -   Functional Status Survey:    Vitals:   02/19/22 1431  BP: (!) 146/77  Pulse: 86  Resp: 18  Temp: 98 F (36.7 C)  SpO2: 94%  Weight: 116 lb 1.6 oz (52.7 kg)  Height: 5' (1.524 m)   Body mass index is 22.67 kg/m. Physical Exam Vitals reviewed.  Constitutional:      General: She is not in acute distress. HENT:     Head: Normocephalic.  Eyes:     General:        Right eye: No discharge.        Left eye: No discharge.  Cardiovascular:     Rate and Rhythm: Normal rate and regular rhythm.     Pulses: Normal pulses.     Heart sounds: Normal heart sounds.  Pulmonary:     Effort: Pulmonary effort is normal. No respiratory distress.     Breath sounds: Examination of the right-upper field reveals rales. Examination of the left-upper field reveals rales. Rales present. No wheezing.  Abdominal:     General: Bowel sounds are normal.  There is no distension.     Palpations: Abdomen is soft.     Tenderness: There is no abdominal tenderness.  Musculoskeletal:     Cervical back: Neck supple.     Right lower leg: Edema present.     Left lower leg: Edema present.  Skin:    General: Skin is warm and dry.     Capillary Refill: Capillary refill takes less than 2 seconds.  Neurological:     General: No focal deficit present.     Mental Status: She is alert. Mental status is at baseline.     Motor: Weakness present.     Gait: Gait abnormal.     Comments: wheelchair  Psychiatric:        Mood and Affect: Mood normal.        Behavior: Behavior normal.    Labs reviewed: Recent Labs    02/27/21 0000 06/04/21 0000  NA 146 136*  K 3.6 3.8  CL 106 95*  CO2 28* 31*  BUN 46* 23*  CREATININE 1.2* 1.2*  CALCIUM 8.7 8.7   Recent Labs    02/27/21 0000 06/04/21 0000  AST 299* 16  ALT 299* 17  ALKPHOS 234* 100  ALBUMIN 3.3* 3.2*   Recent Labs    02/27/21 0000  WBC 10.6  NEUTROABS 7,897.00  HGB 13.8  HCT 43  PLT 174   Lab Results  Component Value Date   TSH 1.64 04/24/2021   No results found for: HGBA1C No results found for: CHOL, HDL, LDLCALC, LDLDIRECT, TRIG, CHOLHDL  Significant Diagnostic Results in last 30 days:  No results found.  Assessment/Plan 1. Bilateral leg edema - mild rales noted on exam, weight down 2 lbs, pitting edema noted  - cont torsemide 10 mg daily - will add torsemide 10 mg po once  2. Adult failure to thrive - followed by hospice  3. COPD, mild ([place]) - cont Nebs prn  4. Gait instability - cont skilled nursing care     Family/ staff Communication: plan discussed with patient and nurse  Labs/tests ordered:  none

## 2022-02-22 ENCOUNTER — Non-Acute Institutional Stay (SKILLED_NURSING_FACILITY): Payer: Medicare PPO | Admitting: Nurse Practitioner

## 2022-02-22 ENCOUNTER — Encounter: Payer: Self-pay | Admitting: Nurse Practitioner

## 2022-02-22 DIAGNOSIS — J449 Chronic obstructive pulmonary disease, unspecified: Secondary | ICD-10-CM | POA: Diagnosis not present

## 2022-02-22 DIAGNOSIS — R413 Other amnesia: Secondary | ICD-10-CM | POA: Diagnosis not present

## 2022-02-22 DIAGNOSIS — R600 Localized edema: Secondary | ICD-10-CM

## 2022-02-22 DIAGNOSIS — J019 Acute sinusitis, unspecified: Secondary | ICD-10-CM | POA: Diagnosis not present

## 2022-02-22 DIAGNOSIS — K5900 Constipation, unspecified: Secondary | ICD-10-CM

## 2022-02-22 DIAGNOSIS — J329 Chronic sinusitis, unspecified: Secondary | ICD-10-CM | POA: Insufficient documentation

## 2022-02-22 DIAGNOSIS — E890 Postprocedural hypothyroidism: Secondary | ICD-10-CM

## 2022-02-22 DIAGNOSIS — G459 Transient cerebral ischemic attack, unspecified: Secondary | ICD-10-CM

## 2022-02-22 DIAGNOSIS — I872 Venous insufficiency (chronic) (peripheral): Secondary | ICD-10-CM

## 2022-02-22 DIAGNOSIS — N1832 Chronic kidney disease, stage 3b: Secondary | ICD-10-CM

## 2022-02-22 DIAGNOSIS — M159 Polyosteoarthritis, unspecified: Secondary | ICD-10-CM

## 2022-02-22 NOTE — Progress Notes (Signed)
Location:   SNF [place] Room Number: 28 Place of Service:  SNF (31) Provider: [facility] Missey Hasley NP  [name], MD  Patient Care Team: [name], MD as PCP - General (Internal Medicine) [place] Kesa Birky X, NP as Nurse Practitioner (Nurse Practitioner)  Extended Emergency Contact Information Primary Emergency Contact: [name] States of [place] Phone: [phone] Relation: Son Secondary Emergency Contact: [place] Phone: [phone] Mobile Phone: [phone] Relation: Daughter  Code Status:  DNR Goals of care: Advanced Directive information Advanced Directives 02/19/2022  Does Patient Have a Medical Advance Directive? Yes  Type of Paramedic of [place];Living will;Out of facility DNR (pink MOST or yellow form)  Does patient want to make changes to medical advance directive? No - Patient declined  Copy of [place] in Chart? Yes - validated most recent copy scanned in chart (See row information)  Would patient like information on creating a medical advance directive? -  Pre-existing out of facility DNR order (yellow form or pink MOST form) Yellow form placed in chart (order not valid for inpatient use);Pink MOST form placed in chart (order not valid for inpatient use)     Chief Complaint  Patient presents with   Medical Management of Chronic Issues    HPI:  Pt is a 88 y.o. female seen today for medical management of chronic diseases.    Noted yellow greenish nasal drainage, afebrile, not sure is facial pressure or sinus pain, not able to check throat due to her not following direction.    Adult failure to thrive, under hospice service             Frequent falls, due to frailty and unsteady of BLE             COVID infection, declined Paxlovid             Gait abnormality, w/c for mobility, needs assistance with transfer.             Memory  deficit, needs total care, under Hospice service.              Hypothyroidism, takes Levothyroxine, TSH 1.64 04/24/21             Constipation, takes Senokot S II qhs, MOM prn             COPD, takes Allegra, DuoNeb,  prn O2 via [place]             Edema BLE, chronic, improved, takes Torsemide             CKD, Bun/creat 23/1.15 eGFR 37 06/04/21             OA pain, prn Tylenol, Tramadol.              TIA ?, Hx of CAD,  takes ASA 49m qd.              Elevated LFT, normalized AST 16, ALT 17, alk phos 100 06/04/21, off routine Tylenol, RUQ [place] 10/21/20 [place] multiple gallstones in the gallbladder, largest 0.8cm.    Past Medical History:  Diagnosis Date   Abnormal liver function tests    11/11/15 AST 31, ALT 88, alk phos 96    Acute kidney failure, unspecified ([place] 01/24/2012   Arthralgia 09/26/2014   Multiple joints: knees, shoulders, wrists, spine hips    Arthritis    Candidiasis of other urogenital sites 08/10/2012  Cerebral embolism 05/23/2005   CHF (congestive heart failure) ([place])    Cholelithiasis 11/04/2015   Closed fracture of sacrum and coccyx without mention of spinal cord injury 02/14/2012   Constipation 05/03/2013   Contusion of face, scalp, and neck except eye(s) 06/01/2012   Contusion of wrist 06/01/2012   COPD (chronic obstructive pulmonary disease) (HCC)    COPD, mild ([place]) 01/20/2013   Edema 04/20/2012   H/O: CVA (cerebrovascular accident)    Hearing loss 09/26/2014   History of cancer of uterus    HTN (hypertension), benign    Hyperglycemia 11/14/2015   Hypothyroidism    Insomnia, unspecified 08/10/2012   Open wound of knee, leg (except thigh), and ankle, without mention of complication 4/0/9811   Other and unspecified hyperlipidemia 06/01/2012   Other disorder of coccyx 01/27/2012   Pain in joint, ankle and foot 06/14/2012   Peripheral vascular disease, unspecified ([place]) 01/27/2012   Personal history of fall 01/27/2012   Pneumonia, organism unspecified([place]) 01/23/2005   Rheumatic  fever 09/26/1933   Age 40 10/01/14 ESR 13, RAF <10    Seasonal allergies 05/03/2013   Stasis dermatitis of both legs 09/26/2014   Unspecified constipation 11/02/2012   Unspecified hearing loss 02/08/2013   Unspecified hereditary and idiopathic peripheral neuropathy 01/27/2012   Urinary frequency 02/24/2014   Ventricular fibrillation ([place]) 01/24/2012   Past Surgical History:  Procedure Laterality Date   ABDOMINAL HYSTERECTOMY  1990   for endometrial cancer   CATARACT EXTRACTION W/ INTRAOCULAR LENS  IMPLANT, [place]   bilateral   THYROIDECTOMY  1960   TONSILLECTOMY  1916    Allergies  Allergen Reactions   Amoxicillin     Unknown, patient unable to answer questionnaire    Aspirin Other (See Comments)    On MAR   Avelox [Moxifloxacin]     Unknown: listed on MAR   Erythromycin     Unknown: listed on MAR   Monistat [Miconazole]     Unknown: listed on MAR   Morphine And Related     Unknown: listed on MAR   Orange Juice [Orange Oil]     Unknown: listed on MAR    Allergies as of 02/22/2022       Reactions   Amoxicillin    Unknown, patient unable to answer questionnaire    Aspirin Other (See Comments)   On MAR   Avelox [moxifloxacin]    Unknown: listed on MAR   Erythromycin    Unknown: listed on MAR   Monistat [miconazole]    Unknown: listed on MAR   Morphine And Related    Unknown: listed on MAR   Orange Juice [orange Oil]    Unknown: listed on [facility]        Medication List        Accurate as of February 22, 2022  1:43 PM. If you have any questions, ask your nurse or doctor.          aspirin 81 MG chewable tablet Chew 81 mg by mouth daily.   carboxymethylcellulose 0.5 % Soln Commonly known as: REFRESH PLUS Place 1 drop into both eyes 2 (two) times daily.   fexofenadine 60 MG tablet Commonly known as: ALLEGRA Take 60 mg by mouth daily.   levothyroxine 137 MCG tablet Commonly  known as: SYNTHROID Take 137 mcg by mouth daily before breakfast.   melatonin 3 MG Tabs tablet Take 3  mg by mouth at bedtime.   MILK OF MAGNESIA PO Take 30 mLs by mouth as needed.   OcuSoft Lid Scrub Allergy Pads Place 1 each into both eyes in the morning and at bedtime. Cleanse bilateral lids for excess eye secretions.   potassium chloride 10 MEQ tablet Commonly known as: KLOR-CON M Take 10 mEq by mouth. Mon, Tues, Wed, Thurs, and Fri   sennosides-docusate sodium 8.6-50 MG tablet Commonly known as: SENOKOT-S Take 2 tablets by mouth at bedtime.   torsemide 10 MG tablet Commonly known as: DEMADEX Take 10 mg by mouth. Once A Day on Mon, Tue, Wed, Thu, Fri   traMADol 50 MG tablet Commonly known as: ULTRAM Take 50 mg by mouth every 8 (eight) hours as needed.        Review of Systems  Unable to perform ROS: Dementia   Immunization History  Administered Date(s) Administered   Influenza Whole 10/19/2012, 09/15/2018   Influenza, High Dose Seasonal PF 09/14/2019   Influenza-Unspecified 10/18/2013, 10/17/2014, 09/02/2015, 09/29/2016, 10/03/2017, 09/23/2020, 09/30/2021   Moderna Sars-Covid-2 Vaccination 12/15/2019, 01/12/2020, 10/21/2020, 05/12/2021, 09/01/2021   PPD Test 01/22/2013, 11/28/2015   Pneumococcal Conjugate-13 11/18/2017   Pneumococcal Polysaccharide-23 01/22/2013   Td 02/21/2009   Tdap 04/07/2015   Pertinent  Health Maintenance Due  Topic Date Due   INFLUENZA VACCINE  Completed   Fall Risk 09/02/2017 09/05/2018 10/12/2020 10/12/2020 07/06/2021  Falls in the past year? No Yes - - -  Was there an injury with Fall? - Yes - - -  Was there an injury with Fall? - - - - -  Patient Fall Risk Level - - High fall risk High fall risk High fall risk  Patient at Risk for Falls Due to - - - - -   Functional Status Survey:    Vitals:   02/22/22 1341  BP: 132/89  Pulse: 98  Resp: 18  Temp: 98 F (36.7 C)  SpO2: 90%   There is no height or weight on file to  calculate BMI. Physical Exam Vitals and nursing note reviewed.  Constitutional:      Comments: Sleepy.  HENT:     Head: Normocephalic and atraumatic.     Nose: Congestion and rhinorrhea present.     Comments: Yellowish, greenish nasal drainage    Mouth/Throat:     Mouth: Mucous membranes are moist.  Eyes:     Extraocular Movements: Extraocular movements intact.     Pupils: Pupils are equal, round, and reactive to light.  Cardiovascular:     Rate and Rhythm: Normal rate and regular rhythm.     Heart sounds: Murmur heard.  Pulmonary:     Effort: Pulmonary effort is normal.     Breath sounds: Wheezing present. No rhonchi or rales.     Comments: Mild expiratory wheezes posterior lungs.  Abdominal:     General: Bowel sounds are normal.     Palpations: Abdomen is soft.     Tenderness: There is no abdominal tenderness.  Musculoskeletal:     Cervical back: Normal range of motion and neck supple.     Right lower leg: Edema present.     Left lower leg: Edema present.     Comments: Trace to 1+edema BLE  Skin:    General: Skin is warm and dry.  Neurological:     General: No focal deficit present.     Mental Status: She is alert. Mental status is at baseline.     Gait: Gait abnormal.  Comments: Oriented to person, place.   Psychiatric:        Mood and Affect: Mood normal.        Behavior: Behavior normal.    Labs reviewed: Recent Labs    02/27/21 0000 06/04/21 0000  NA 146 136*  K 3.6 3.8  CL 106 95*  CO2 28* 31*  BUN 46* 23*  CREATININE 1.2* 1.2*  CALCIUM 8.7 8.7   Recent Labs    02/27/21 0000 06/04/21 0000  AST 299* 16  ALT 299* 17  ALKPHOS 234* 100  ALBUMIN 3.3* 3.2*   Recent Labs    02/27/21 0000  WBC 10.6  NEUTROABS 7,897.00  HGB 13.8  HCT 43  PLT 174   Lab Results  Component Value Date   TSH 1.64 04/24/2021   No results found for: HGBA1C No results found for: CHOL, HDL, LDLCALC, LDLDIRECT, TRIG, CHOLHDL  Significant Diagnostic Results in  last 30 days:  No results found.  Assessment/Plan  Sinusitis Noted yellow greenish nasal drainage, afebrile, not sure is facial pressure or sinus pain, not able to check throat due to her not following direction.  Will treat with Doxycycline 150m bid x 7days.   COPD, mild ([place]  takes Allegra, will schedule DuoNeb q8hr x 3 days,  prn O2 via [place], mild expiratory wheezes noted posterior lungs, HPOA declined CXR. Observe.   Memory deficits needs total care, under Hospice service.   Hypothyroidism  takes Levothyroxine, TSH 1.64 04/24/21  Constipation  Stable, takes Senokot S II qhs, MOM prn  Edema of both lower legs due to peripheral venous insufficiency  chronic, improved, takes Torsemide  Chronic kidney disease (CKD), stage III (moderate) Bun/creat 23/1.15 eGFR 37 06/04/21  TIA (transient ischemic attack) Hx of CAD,  takes ASA 885mqd.   Osteoarthritis, multiple sites In general, continue prn Tylenol, Tramadol.    Family/ staff Communication: plan of care reviewed with the patient, the patient's HOPA, charge nurse, and Hospice nurse.   Labs/tests ordered:  none  Time spend 35 minutes.

## 2022-02-22 NOTE — Assessment & Plan Note (Signed)
takes Levothyroxine, TSH 1.64 04/24/21 °

## 2022-02-22 NOTE — Assessment & Plan Note (Signed)
Hx of CAD,  takes ASA 81mg qd °

## 2022-02-22 NOTE — Assessment & Plan Note (Signed)
Stable, takes Senokot S II qhs, MOM prn ?

## 2022-02-22 NOTE — Assessment & Plan Note (Signed)
Noted yellow greenish nasal drainage, afebrile, not sure is facial pressure or sinus pain, not able to check throat due to her not following direction.  ?Will treat with Doxycycline 100mg  bid x 7days.  ?

## 2022-02-22 NOTE — Assessment & Plan Note (Signed)
In general, continue prn Tylenol, Tramadol.  ?

## 2022-02-22 NOTE — Assessment & Plan Note (Signed)
chronic, improved, takes Torsemide ?

## 2022-02-22 NOTE — Assessment & Plan Note (Signed)
takes Allegra, will schedule DuoNeb q8hr x 3 days,  prn O2 via [place], mild expiratory wheezes noted posterior lungs, HPOA declined CXR. Observe.  ?

## 2022-02-22 NOTE — Assessment & Plan Note (Signed)
needs total care, under Hospice service.  °

## 2022-02-22 NOTE — Assessment & Plan Note (Signed)
Bun/creat 23/1.15 eGFR 37 06/04/21 

## 2022-03-15 ENCOUNTER — Encounter: Payer: Self-pay | Admitting: Nurse Practitioner

## 2022-03-15 ENCOUNTER — Non-Acute Institutional Stay (SKILLED_NURSING_FACILITY): Payer: Medicare PPO | Admitting: Nurse Practitioner

## 2022-03-15 DIAGNOSIS — R627 Adult failure to thrive: Secondary | ICD-10-CM

## 2022-03-15 DIAGNOSIS — M159 Polyosteoarthritis, unspecified: Secondary | ICD-10-CM

## 2022-03-15 DIAGNOSIS — J449 Chronic obstructive pulmonary disease, unspecified: Secondary | ICD-10-CM

## 2022-03-15 DIAGNOSIS — R413 Other amnesia: Secondary | ICD-10-CM

## 2022-03-15 DIAGNOSIS — K8018 Calculus of gallbladder with other cholecystitis without obstruction: Secondary | ICD-10-CM

## 2022-03-15 DIAGNOSIS — G459 Transient cerebral ischemic attack, unspecified: Secondary | ICD-10-CM

## 2022-03-15 DIAGNOSIS — I872 Venous insufficiency (chronic) (peripheral): Secondary | ICD-10-CM

## 2022-03-15 DIAGNOSIS — K5900 Constipation, unspecified: Secondary | ICD-10-CM

## 2022-03-15 DIAGNOSIS — R600 Localized edema: Secondary | ICD-10-CM

## 2022-03-15 DIAGNOSIS — E890 Postprocedural hypothyroidism: Secondary | ICD-10-CM

## 2022-03-15 NOTE — Progress Notes (Signed)
?Location:   [facility] ?Nursing Home Room Number: 07W ?Place of Service:  SNF ([id number]) ?Provider:  Breck Maryland X, NP ? ?[name], MD ? ?Patient Care Team: ?[name], MD as PCP - General (Internal Medicine) ?[place] ?Layah Skousen X, NP as Nurse Practitioner (Nurse Practitioner) ? ?Extended Emergency Contact Information ?Primary Emergency Contact: Coyle,Samuel ? [place] ?Home Phone: [phone] ?Relation: Son ?Secondary Emergency Contact: Graf-Brazel,Jean ?Home Phone: [phone] ?Mobile Phone: [phone] ?Relation: Daughter ? ?Code Status:  DNR ?Goals of care: Advanced Directive information ? ?  03/15/2022  ?  9:39 AM  ?Advanced Directives  ?Type of Paramedic of [place];Living will;Out of facility DNR (pink MOST or yellow form)  ?Does patient want to make changes to medical advance directive? No - Patient declined  ?Copy of [place] in Chart? Yes - validated most recent copy scanned in chart (See row information)  ?Pre-existing out of facility DNR order (yellow form or pink MOST form) Pink MOST form placed in chart (order not valid for inpatient use);Yellow form placed in chart (order not valid for inpatient use)  ? ? ? ?Chief Complaint  ?Patient presents with  ? Medical Management of Chronic Issues  ?  Routine follow up visit.  ? Immunizations  ?  Shingrix vaccine  ? ? ?HPI:  ?Pt is a 88 y.o. female seen today for medical management of chronic diseases.   ? Adult failure to thrive, under hospice service, weight loss #7Ibs in the past month.  ?            Frequent falls, due to frailty and unsteady of BLE ?            Gait abnormality, w/c for mobility, needs assistance with transfer. ?            Memory deficit, needs total care, under Hospice service.  ?            Hypothyroidism, takes Levothyroxine, TSH 1.64 04/24/21 ?            Constipation, takes Senokot S II qhs, MOM prn ?             COPD, takes Allegra,  prn O2 via [place] ?            Edema BLE, chronic, improved, takes Torsemide ?            CKD, Bun/creat 23/1.15 eGFR 37 06/04/21 ?            OA pain, prn Tylenol, Tramadol.  ?            TIA ?, Hx of CAD,  takes ASA 48m qd.  ?            Elevated LFT, normalized AST 16, ALT 17, alk phos 100 06/04/21, off routine Tylenol, RUQ [place] 10/21/20 [place] multiple gallstones in the gallbladder, largest 0.8cm.  ? ?Past Medical History:  ?Diagnosis Date  ? Abnormal liver function tests   ? 11/11/15 AST 31, ALT 88, alk phos 96   ? Acute kidney failure, unspecified ([place] 01/24/2012  ? Arthralgia 09/26/2014  ? Multiple joints: knees, shoulders, wrists, spine hips   ? Arthritis   ? Candidiasis of other urogenital sites 08/10/2012  ? Cerebral embolism 05/23/2005  ? CHF (congestive heart failure) ([place]   ? Cholelithiasis 11/04/2015  ? Closed fracture of sacrum and coccyx without mention of spinal cord injury 02/14/2012  ?  Constipation 05/03/2013  ? Contusion of face, scalp, and neck except eye(s) 06/01/2012  ? Contusion of wrist 06/01/2012  ? COPD (chronic obstructive pulmonary disease) ([place])   ? COPD, mild ([place]) 01/20/2013  ? Edema 04/20/2012  ? H/O: CVA (cerebrovascular accident)   ? Hearing loss 09/26/2014  ? History of cancer of uterus   ? HTN (hypertension), benign   ? Hyperglycemia 11/14/2015  ? Hypothyroidism   ? Insomnia, unspecified 08/10/2012  ? Open wound of knee, leg (except thigh), and ankle, without mention of complication 0/06/6225  ? Other and unspecified hyperlipidemia 06/01/2012  ? Other disorder of coccyx 01/27/2012  ? Pain in joint, ankle and foot 06/14/2012  ? Peripheral vascular disease, unspecified ([place]) 01/27/2012  ? Personal history of fall 01/27/2012  ? Pneumonia, organism unspecified(486) 01/23/2005  ? Rheumatic fever 09/26/1933  ? Age 39 10/01/14 ESR 13, RAF <10   ? Seasonal allergies 05/03/2013  ? Stasis dermatitis of both legs 09/26/2014  ? Unspecified constipation 11/02/2012  ? Unspecified hearing loss  02/08/2013  ? Unspecified hereditary and idiopathic peripheral neuropathy 01/27/2012  ? Urinary frequency 02/24/2014  ? Ventricular fibrillation ([place]) 01/24/2012  ? ?Past Surgical History:  ?Procedure Laterality Date  ? ABDOMINAL HYSTERECTOMY  1990  ? for endometrial cancer  ? CATARACT EXTRACTION W/ INTRAOCULAR LENS  IMPLANT, BILATERAL    ? [place]  ? GUM SURGERY  1932  ? MASTOIDECTOMY  1920  ? bilateral  ? THYROIDECTOMY  1960  ? TONSILLECTOMY  1916  ? ? ?Allergies  ?Allergen Reactions  ? Amoxicillin   ?  Unknown, patient unable to answer questionnaire   ? Aspirin Other (See Comments)  ?  On MAR  ? Avelox [Moxifloxacin]   ?  Unknown: listed on MAR  ? Erythromycin   ?  Unknown: listed on MAR  ? Monistat [Miconazole]   ?  Unknown: listed on MAR  ? Morphine And Related   ?  Unknown: listed on MAR  ? Orange Juice [Orange Oil]   ?  Unknown: listed on MAR  ? ? ?Allergies as of 03/15/2022   ? ?   Reactions  ? Amoxicillin   ? Unknown, patient unable to answer questionnaire   ? Aspirin Other (See Comments)  ? On MAR  ? Avelox [moxifloxacin]   ? Unknown: listed on MAR  ? Erythromycin   ? Unknown: listed on MAR  ? Monistat [miconazole]   ? Unknown: listed on MAR  ? Morphine And Related   ? Unknown: listed on MAR  ? Orange Juice [orange Oil]   ? Unknown: listed on MAR  ? ?  ? ?  ?Medication List  ?  ? ?  ? Accurate as of March 15, 2022 11:59 PM. If you have any questions, ask your nurse or doctor.  ?  ?  ? ?  ? ?aspirin 81 MG chewable tablet ?Chew 81 mg by mouth daily. ?  ?carboxymethylcellulose 0.5 % Soln ?Commonly known as: REFRESH PLUS ?Place 1 drop into both eyes 2 (two) times daily. ?  ?fexofenadine 60 MG tablet ?Commonly known as: ALLEGRA ?Take 60 mg by mouth daily. ?  ?levothyroxine 137 MCG tablet ?Commonly known as: SYNTHROID ?Take 137 mcg by mouth daily before breakfast. ?  ?melatonin 3 MG Tabs tablet ?Take 3 mg by mouth at bedtime. ?  ?MILK OF MAGNESIA PO ?Take 30 mLs by mouth as needed. ?  ?OcuSoft  Lid Scrub Allergy Pads ?Place 1 each into both eyes in the  morning and at bedtime. Cleanse bilateral lids for excess eye secretions. ?  ?potassium chloride 10 MEQ tablet ?Commonly known as: KLOR-CON M ?Take 10 mEq by mouth. Mon, Tues, Wed, Thurs, and Fri ?  ?sennosides-docusate sodium 8.6-50 MG tablet ?Commonly known as: SENOKOT-S ?Take 2 tablets by mouth at bedtime. ?  ?torsemide 10 MG tablet ?Commonly known as: DEMADEX ?Take 10 mg by mouth. Once A Day on Mon, Tue, Wed, Thu, Fri ?  ?traMADol 50 MG tablet ?Commonly known as: ULTRAM ?Take 50 mg by mouth every 8 (eight) hours as needed. ?  ? ?  ? ? ?Review of Systems  ?Unable to perform ROS: Dementia  ? ?Immunization History  ?Administered Date(s) Administered  ? Influenza Whole 10/19/2012, 09/15/2018  ? Influenza, High Dose Seasonal PF 09/14/2019  ? Influenza-Unspecified 10/18/2013, 10/17/2014, 09/02/2015, 09/29/2016, 10/03/2017, 09/23/2020, 09/30/2021  ? Moderna Sars-Covid-2 Vaccination 12/15/2019, 01/12/2020, 10/21/2020, 05/12/2021, 09/01/2021  ? PPD Test 01/22/2013, 11/28/2015  ? Pneumococcal Conjugate-13 11/18/2017  ? Pneumococcal Polysaccharide-23 01/22/2013  ? Td 02/21/2009  ? Tdap 04/07/2015  ? ?Pertinent  Health Maintenance Due  ?Topic Date Due  ? INFLUENZA VACCINE  07/13/2022  ? ? ?  09/02/2017  ?  1:42 PM 09/05/2018  ? 11:41 AM 10/12/2020  ? 11:59 AM 10/12/2020  ?  6:04 PM 07/06/2021  ?  6:13 AM  ?Fall Risk  ?Falls in the past year? No Yes     ?Was there an injury with Fall?  Yes     ?Patient Fall Risk Level   High fall risk High fall risk High fall risk  ? ?Functional Status Survey: ?  ? ?Vitals:  ? 03/15/22 0936  ?BP: 140/70  ?Pulse: 70  ?Resp: 20  ?Temp: (!) 97.2 ?F (36.2 ?C)  ?SpO2: 95%  ?Weight: 109 lb 1.6 oz (49.5 kg)  ?Height: 5' (1.524 m)  ? ?Body mass index is 21.31 kg/m?Marland Kitchen ?Physical Exam ?Vitals and nursing note reviewed.  ?Constitutional:   ?   Comments: Sleepy.  ?HENT:  ?   Head: Normocephalic and atraumatic.  ?   Nose: Nose normal.  ?    Mouth/Throat:  ?   Mouth: Mucous membranes are moist.  ?Eyes:  ?   Extraocular Movements: Extraocular movements intact.  ?   Pupils: Pupils are equal, round, and reactive to light.  ?Cardiovascular:  ?   Rate and Rhythm: N

## 2022-03-16 ENCOUNTER — Encounter: Payer: Self-pay | Admitting: Nurse Practitioner

## 2022-03-16 NOTE — Assessment & Plan Note (Signed)
Elevated LFT, normalized AST 16, ALT 17, alk phos 100 06/04/21, off routine Tylenol, RUQ US, 10/21/20 US abd multiple gallstones in the gallbladder, largest 0.8cm.  °

## 2022-03-16 NOTE — Assessment & Plan Note (Signed)
takes Levothyroxine, TSH 1.64 04/24/21 °

## 2022-03-16 NOTE — Assessment & Plan Note (Signed)
under hospice service, weight loss #7Ibs in the past month.  ?

## 2022-03-16 NOTE — Assessment & Plan Note (Signed)
takes Allegra,  prn O2 via [place] ?

## 2022-03-16 NOTE — Assessment & Plan Note (Signed)
,   prn Tylenol, Tramadol.  ?

## 2022-03-16 NOTE — Assessment & Plan Note (Signed)
Stable, takes Senokot S II qhs, MOM prn ?

## 2022-03-16 NOTE — Assessment & Plan Note (Signed)
needs total care, under Hospice service.  °

## 2022-03-16 NOTE — Assessment & Plan Note (Signed)
No recent episodes, continue ASA 81mg  qd ?

## 2022-03-16 NOTE — Assessment & Plan Note (Signed)
chronic, improved, takes Torsemide ?

## 2022-04-07 ENCOUNTER — Telehealth: Payer: Self-pay

## 2022-04-07 NOTE — Telephone Encounter (Signed)
Correct prescription for patient, but believe it was sent too early in error. No record of recent refill in Epic.

## 2022-04-07 NOTE — Telephone Encounter (Signed)
Noted.  ? ?This is a skilled patient and no further action is needed from our end ?

## 2022-04-07 NOTE — Telephone Encounter (Signed)
SNF- Skilled Nursing Facility Patient. ? ?Message should be handled by the facility providers and staff on site. ? ?Fax was received stating rx was ordered for Pot Chloride 10 meq ER 1 week or more early and they need clarification about whether or not rx was sent in error. ? ? ?

## 2022-04-13 DIAGNOSIS — E039 Hypothyroidism, unspecified: Secondary | ICD-10-CM | POA: Diagnosis not present

## 2022-04-14 LAB — BASIC METABOLIC PANEL
BUN: 30 — AB (ref 4–21)
CO2: 30 — AB (ref 13–22)
Chloride: 103 (ref 99–108)
Creatinine: 0.7 (ref 0.5–1.1)
Glucose: 71
Potassium: 4.4 mEq/L (ref 3.5–5.1)
Sodium: 139 (ref 137–147)

## 2022-04-14 LAB — TSH: TSH: 1.8 (ref 0.41–5.90)

## 2022-04-14 LAB — COMPREHENSIVE METABOLIC PANEL: Calcium: 8.8 (ref 8.7–10.7)

## 2022-05-06 ENCOUNTER — Encounter: Payer: Self-pay | Admitting: Internal Medicine

## 2022-05-06 ENCOUNTER — Non-Acute Institutional Stay (SKILLED_NURSING_FACILITY): Admitting: Internal Medicine

## 2022-05-06 DIAGNOSIS — J449 Chronic obstructive pulmonary disease, unspecified: Secondary | ICD-10-CM

## 2022-05-06 DIAGNOSIS — G459 Transient cerebral ischemic attack, unspecified: Secondary | ICD-10-CM | POA: Diagnosis not present

## 2022-05-06 DIAGNOSIS — E890 Postprocedural hypothyroidism: Secondary | ICD-10-CM | POA: Diagnosis not present

## 2022-05-06 DIAGNOSIS — M159 Polyosteoarthritis, unspecified: Secondary | ICD-10-CM

## 2022-05-06 DIAGNOSIS — R600 Localized edema: Secondary | ICD-10-CM

## 2022-05-06 DIAGNOSIS — I872 Venous insufficiency (chronic) (peripheral): Secondary | ICD-10-CM

## 2022-05-06 NOTE — Progress Notes (Signed)
Location:   [place] Room Number: 28 Place of Service:  SNF [phone]) Provider:  Veleta Miners MD   [name], MD  Patient Care Team: [name], MD as PCP - General (Internal Medicine) [place] [place] Mast, Man X, NP as Nurse Practitioner (Nurse Practitioner)  Extended Emergency Contact Information Primary Emergency Contact: [name] Phone: [phone] Relation: Son Secondary Emergency Contact: [place] Phone: [phone] Mobile Phone: [phone] Relation: Daughter  Code Status:  DNR Hospice Managed Care Goals of care: Advanced Directive information    05/06/2022    4:41 PM  Advanced Directives  Does Patient Have a Medical Advance Directive? Yes  Type of Paramedic of [place];Living will;Out of facility DNR (pink MOST or yellow form)  Does patient want to make changes to medical advance directive? No - Patient declined  Copy of [place] in Chart? Yes - validated most recent copy scanned in chart (See row information)  Pre-existing out of facility DNR order (yellow form or pink MOST form) Pink MOST form placed in chart (order not valid for inpatient use);Yellow form placed in chart (order not valid for inpatient use)     Chief Complaint  Patient presents with   Medical Management of Chronic Issues   Quality Metric Gaps    Verified matrix and [facility] patient is due for shingrix    HPI:  Pt is a 88 y.o. female seen today for medical management of chronic diseases.    Patient has h/o Bilateral LE edema , Hypertension, Hard of hearing, h/o Cellulitis, Hypothyroidism, COPD, Chronic Conjunctivitis  has h/o Gall stones with Hepatitis    Patient is unable to give much history Very HOH  Stays in her Recliner now and Sleeps Continues to have falls Tries to get up to go to [place] and falls Her POA wants her to be  comfortable  No Aggressive treatment No hospitalization Enrolled in Hospice   Wt Readings from Last 3 Encounters:  05/06/22 120 lb (54.4 kg)  03/15/22 109 lb 1.6 oz (49.5 kg)  02/19/22 116 lb 1.6 oz (52.7 kg)   Past Medical History:  Diagnosis Date   Abnormal liver function tests    11/11/15 AST 31, ALT 88, alk phos 96    Acute kidney failure, unspecified ([place]) 01/24/2012   Arthralgia 09/26/2014   Multiple joints: knees, shoulders, wrists, spine hips    Arthritis    Candidiasis of other urogenital sites 08/10/2012   Cerebral embolism 05/23/2005   CHF (congestive heart failure) ([place])    Cholelithiasis 11/04/2015   Closed fracture of sacrum and coccyx without mention of spinal cord injury 02/14/2012   Constipation 05/03/2013   Contusion of face, scalp, and neck except eye(s) 06/01/2012   Contusion of wrist 06/01/2012   COPD (chronic obstructive pulmonary disease) (HCC)    COPD, mild ([place]) 01/20/2013   Edema 04/20/2012   H/O: CVA (cerebrovascular accident)    Hearing loss 09/26/2014   History of cancer of uterus    HTN (hypertension), benign    Hyperglycemia 11/14/2015   Hypothyroidism    Insomnia, unspecified 08/10/2012   Open wound of knee, leg (except thigh), and ankle, without mention of complication 04/15/6643   Other and unspecified hyperlipidemia 06/01/2012   Other disorder of coccyx 01/27/2012   Pain in joint, ankle and foot 06/14/2012   Peripheral vascular disease, unspecified ([place]) 01/27/2012   Personal history of fall 01/27/2012  Pneumonia, organism unspecified(486) 01/23/2005   Rheumatic fever 09/26/1933   Age 31 10/01/14 ESR 13, RAF <10    Seasonal allergies 05/03/2013   Stasis dermatitis of both legs 09/26/2014   Unspecified constipation 11/02/2012   Unspecified hearing loss 02/08/2013   Unspecified hereditary and idiopathic peripheral neuropathy 01/27/2012   Urinary frequency 02/24/2014   Ventricular fibrillation ([place]) 01/24/2012   Past Surgical History:  Procedure Laterality  Date   ABDOMINAL HYSTERECTOMY  1990   for endometrial cancer   CATARACT EXTRACTION W/ INTRAOCULAR LENS  IMPLANT, BILATERAL     ECTOPIC PREGNANCY SURGERY  1952   GUM SURGERY  1932   MASTOIDECTOMY  1920   bilateral   THYROIDECTOMY  1960   TONSILLECTOMY  1916    Allergies  Allergen Reactions   Amoxicillin     Unknown, patient unable to answer questionnaire    Aspirin Other (See Comments)    On MAR   Avelox [Moxifloxacin]     Unknown: listed on MAR   Erythromycin     Unknown: listed on MAR   Monistat [Miconazole]     Unknown: listed on MAR   Morphine And Related     Unknown: listed on MAR   Orange Juice [Orange Oil]     Unknown: listed on MAR    Allergies as of 05/06/2022       Reactions   Amoxicillin    Unknown, patient unable to answer questionnaire    Aspirin Other (See Comments)   On MAR   Avelox [moxifloxacin]    Unknown: listed on MAR   Erythromycin    Unknown: listed on MAR   Monistat [miconazole]    Unknown: listed on MAR   Morphine And Related    Unknown: listed on MAR   Orange Juice [orange Oil]    Unknown: listed on [facility]        Medication List        Accurate as of May 06, 2022  4:41 PM. If you have any questions, ask your nurse or doctor.          aspirin 81 MG chewable tablet Chew 81 mg by mouth daily.   carboxymethylcellulose 0.5 % Soln Commonly known as: REFRESH PLUS Place 1 drop into both eyes 2 (two) times daily.   fexofenadine 60 MG tablet Commonly known as: ALLEGRA Take 60 mg by mouth daily.   levothyroxine 137 MCG tablet Commonly known as: SYNTHROID Take 137 mcg by mouth daily before breakfast.   melatonin 3 MG Tabs tablet Take 3 mg by mouth at bedtime.   MILK OF MAGNESIA PO Take 30 mLs by mouth as needed.   OcuSoft Lid Scrub Allergy Pads Place 1 each into both eyes in the morning and at bedtime. Cleanse bilateral lids for excess eye secretions.   potassium chloride 10 MEQ tablet Commonly known as: KLOR-CON M Take 10  mEq by mouth. Mon, Tues, Wed, Thurs, and Fri   sennosides-docusate sodium 8.6-50 MG tablet Commonly known as: SENOKOT-S Take 2 tablets by mouth at bedtime.   torsemide 10 MG tablet Commonly known as: DEMADEX Take 10 mg by mouth. Once A Day on Mon, Tue, Wed, Thu, Fri   traMADol 50 MG tablet Commonly known as: ULTRAM Take 50 mg by mouth every 8 (eight) hours as needed.        Review of Systems  Unable to perform ROS: Other (Does not respond Very HOH and cant see)   Immunization History  Administered Date(s) Administered   Influenza  Whole 10/19/2012, 09/15/2018   Influenza, High Dose Seasonal PF 09/14/2019   Influenza-Unspecified 10/18/2013, 10/17/2014, 09/02/2015, 09/29/2016, 10/03/2017, 09/23/2020, 09/30/2021   Moderna Sars-Covid-2 Vaccination 12/15/2019, 01/12/2020, 10/21/2020, 05/12/2021, 09/01/2021   PPD Test 01/22/2013, 11/28/2015   Pneumococcal Conjugate-13 11/18/2017   Pneumococcal Polysaccharide-23 01/22/2013   Td 02/21/2009   Tdap 04/07/2015   Pertinent  Health Maintenance Due  Topic Date Due   INFLUENZA VACCINE  07/13/2022      09/02/2017    1:42 PM 09/05/2018   11:41 AM 10/12/2020   11:59 AM 10/12/2020    6:04 PM 07/06/2021    6:13 AM  [place] in the past year? No Yes     Was there an injury with Fall?  Yes     Patient Fall Risk Level   High fall risk High fall risk High fall risk   Functional Status Survey:    Vitals:   05/06/22 1625  BP: 140/70  Pulse: 73  Resp: 18  Temp: (!) 96.8 F (36 C)  SpO2: 96%  Weight: 120 lb (54.4 kg)  Height: 5' (1.524 m)   Body mass index is 23.44 kg/m. Physical Exam Vitals reviewed.  Constitutional:      Appearance: Normal appearance.     Comments: Sleeps most of the time  HENT:     Head: Normocephalic.     Nose: Nose normal.     Mouth/Throat:     Mouth: Mucous membranes are moist.     Pharynx: Oropharynx is clear.  Eyes:     Pupils: Pupils are equal, round, and reactive to light.   Cardiovascular:     Rate and Rhythm: Normal rate and regular rhythm.     Pulses: Normal pulses.     Heart sounds: Normal heart sounds. No murmur heard. Pulmonary:     Effort: Pulmonary effort is normal.     Breath sounds: Wheezing and rales present.  Abdominal:     General: Abdomen is flat. Bowel sounds are normal.     Palpations: Abdomen is soft.  Musculoskeletal:        General: Swelling present.     Cervical back: Neck supple.  Skin:    General: Skin is warm.  Neurological:     General: No focal deficit present.  Psychiatric:        Mood and Affect: Mood normal.        Thought Content: Thought content normal.    Labs reviewed: Recent Labs    06/04/21 0000 04/14/22 0000  NA 136* 139  K 3.8 4.4  CL 95* 103  CO2 31* 30*  BUN 23* 30*  CREATININE 1.2* 0.7  CALCIUM 8.7 8.8   Recent Labs    06/04/21 0000  AST 16  ALT 17  ALKPHOS 100  ALBUMIN 3.2*   No results for input(s): WBC, NEUTROABS, HGB, HCT, MCV, PLT in the last 8760 hours. Lab Results  Component Value Date   TSH 1.80 04/14/2022   No results found for: HGBA1C No results found for: CHOL, HDL, LDLCALC, LDLDIRECT, TRIG, CHOLHDL  Significant Diagnostic Results in last 30 days:  No results found.  Assessment/Plan 1. COPD, mild ([place]) Had Rales on Exam  Will restart her on Duo Nebs BID  2. Edema of both lower legs due to peripheral venous insufficiency Worsening Rales Renal Function checked few days ago was good Has gained weight Will increase her Toresimide to 20 mg with Potassium  3. Postoperative hypothyroidism TSH normal  4. TIA (transient ischemic attack)  On Aspirin  5. Primary osteoarthritis involving multiple joints On Tramadol  Enrolled in Hospice    Family/ staff Communication:   Labs/tests ordered:

## 2022-06-11 ENCOUNTER — Non-Acute Institutional Stay (SKILLED_NURSING_FACILITY): Payer: Medicare PPO | Admitting: Adult Health

## 2022-06-11 ENCOUNTER — Encounter: Payer: Self-pay | Admitting: Adult Health

## 2022-06-11 DIAGNOSIS — S51811A Laceration without foreign body of right forearm, initial encounter: Secondary | ICD-10-CM | POA: Diagnosis not present

## 2022-06-11 DIAGNOSIS — J449 Chronic obstructive pulmonary disease, unspecified: Secondary | ICD-10-CM

## 2022-06-11 DIAGNOSIS — E039 Hypothyroidism, unspecified: Secondary | ICD-10-CM

## 2022-06-11 DIAGNOSIS — R52 Pain, unspecified: Secondary | ICD-10-CM | POA: Diagnosis not present

## 2022-06-11 DIAGNOSIS — R627 Adult failure to thrive: Secondary | ICD-10-CM | POA: Diagnosis not present

## 2022-06-11 DIAGNOSIS — G459 Transient cerebral ischemic attack, unspecified: Secondary | ICD-10-CM

## 2022-06-11 DIAGNOSIS — I872 Venous insufficiency (chronic) (peripheral): Secondary | ICD-10-CM

## 2022-06-11 DIAGNOSIS — R600 Localized edema: Secondary | ICD-10-CM

## 2022-06-11 NOTE — Progress Notes (Unsigned)
Location:  [place] Room Number: [place] of Service:  SNF (31) Provider:  Durenda Age, DNP, FNP-BC  Patient Care Team: [name], MD as PCP - General (Internal Medicine) [facility], Man X, NP as Nurse Practitioner (Nurse Practitioner)  Extended Emergency Contact Information Primary Emergency Contact: [name] [place] of [place] Phone: [phone] Relation: Son Secondary Emergency Contact: [place] Phone: [phone] Mobile Phone: [phone] Relation: Daughter  Code Status:  DNR  Goals of care: Advanced Directive information    06/11/2022   11:29 AM  Advanced Directives  Does Patient Have a Medical Advance Directive? Yes  Type of Paramedic of [place];Living will;Out of facility DNR (pink MOST or yellow form)  Does patient want to make changes to medical advance directive? No - Patient declined  Copy of [place] in Chart? Yes - validated most recent copy scanned in chart (See row information)  Pre-existing out of facility DNR order (yellow form or pink MOST form) Pink MOST form placed in chart (order not valid for inpatient use);Yellow form placed in chart (order not valid for inpatient use)     Chief Complaint  Patient presents with   Acute Visit    Decline in condition.     HPI:  Pt is a 88 y.o. female seen today for medical management of chronic diseases.  ***   Past Medical History:  Diagnosis Date   Abnormal liver function tests    11/11/15 AST 31, ALT 88, alk phos 96    Acute kidney failure, unspecified ([place]) 01/24/2012   Arthralgia 09/26/2014   Multiple joints: knees, shoulders, wrists, spine hips    Arthritis    Candidiasis of other urogenital sites 08/10/2012   Cerebral embolism 05/23/2005   CHF (congestive heart failure) ([place])    Cholelithiasis 11/04/2015   Closed fracture of sacrum and  coccyx without mention of spinal cord injury 02/14/2012   Constipation 05/03/2013   Contusion of face, scalp, and neck except eye(s) 06/01/2012   Contusion of wrist 06/01/2012   COPD (chronic obstructive pulmonary disease) ([place])    COPD, mild ([place]) 01/20/2013   Edema 04/20/2012   H/O: CVA (cerebrovascular accident)    Hearing loss 09/26/2014   History of cancer of uterus    HTN (hypertension), benign    Hyperglycemia 11/14/2015   Hypothyroidism    Insomnia, unspecified 08/10/2012   Open wound of knee, leg (except thigh), and ankle, without mention of complication 04/18/8468   Other and unspecified hyperlipidemia 06/01/2012   Other disorder of coccyx 01/27/2012   Pain in joint, ankle and foot 06/14/2012   Peripheral vascular disease, unspecified ([place]) 01/27/2012   Personal history of fall 01/27/2012   Pneumonia, organism unspecified(486) 01/23/2005   Rheumatic fever 09/26/1933   Age 19 10/01/14 ESR 13, RAF <10    Seasonal allergies 05/03/2013   Stasis dermatitis of both legs 09/26/2014   Unspecified constipation 11/02/2012   Unspecified hearing loss 02/08/2013   Unspecified hereditary and idiopathic peripheral neuropathy 01/27/2012   Urinary frequency 02/24/2014   Ventricular fibrillation ([place]) 01/24/2012   Past Surgical History:  Procedure Laterality Date   ABDOMINAL HYSTERECTOMY  1990   for endometrial cancer   CATARACT EXTRACTION W/ INTRAOCULAR LENS  IMPLANT, BILATERAL     ECTOPIC [place]   bilateral   [place]  1916  Allergies  Allergen Reactions   Amoxicillin     Unknown, patient unable to answer questionnaire    Aspirin Other (See Comments)    On MAR   Avelox [Moxifloxacin]     Unknown: listed on MAR   Erythromycin     Unknown: listed on MAR   Monistat [Miconazole]     Unknown: listed on MAR   Morphine And Related     Unknown: listed on MAR   Orange Juice [Orange Oil]     Unknown: listed on  MAR    Outpatient Encounter Medications as of 06/11/2022  Medication Sig   carboxymethylcellulose (REFRESH PLUS) 0.5 % SOLN Place 1 drop into both eyes 2 (two) times daily.   Eyelid Cleansers (OCUSOFT LID SCRUB ALLERGY) PADS Place 1 each into both eyes in the morning and at bedtime. Cleanse bilateral lids for excess eye secretions.   ipratropium-albuterol (DUONEB) 0.5-2.5 (3) MG/3ML SOLN Inhale 3 mLs into the lungs daily. Administer (1) vial via inhalation for COPD   traMADol (ULTRAM) 50 MG tablet Take 50 mg by mouth every 8 (eight) hours as needed (Pain).   [DISCONTINUED] aspirin 81 MG chewable tablet Chew 81 mg by mouth daily.   [DISCONTINUED] fexofenadine (ALLEGRA) 60 MG tablet Take 60 mg by mouth daily.   [DISCONTINUED] levothyroxine (SYNTHROID) 137 MCG tablet Take 137 mcg by mouth daily before breakfast.   [DISCONTINUED] Magnesium Hydroxide (MILK OF MAGNESIA PO) Take 30 mLs by mouth as needed.   [DISCONTINUED] Melatonin 3 MG TABS Take 3 mg by mouth at bedtime.    [DISCONTINUED] potassium chloride (KLOR-CON) 10 MEQ tablet Take 20 mEq by mouth. Mon, Tues, Wed, Thurs, and Fri   [DISCONTINUED] sennosides-docusate sodium (SENOKOT-S) 8.6-50 MG tablet Take 2 tablets by mouth at bedtime.    [DISCONTINUED] torsemide (DEMADEX) 10 MG tablet Take 20 mg by mouth. Once A Day on Mon, Tue, Wed, Thu, Fri   No facility-administered encounter medications on file as of 06/11/2022.    Review of Systems ***    Immunization History  Administered Date(s) Administered   Influenza Whole 10/19/2012, 09/15/2018   Influenza, High Dose Seasonal PF 09/14/2019   Influenza-Unspecified 10/18/2013, 10/17/2014, 09/02/2015, 09/29/2016, 10/03/2017, 09/23/2020, 09/30/2021   Moderna Sars-Covid-2 Vaccination 12/15/2019, 01/12/2020, 10/21/2020, 05/12/2021, 09/01/2021   PPD Test 01/22/2013, 11/28/2015   Pneumococcal Conjugate-13 11/18/2017   Pneumococcal Polysaccharide-23 01/22/2013   Td 02/21/2009   Tdap 04/07/2015    Pertinent  Health Maintenance Due  Topic Date Due   INFLUENZA VACCINE  07/13/2022      09/02/2017    1:42 PM 09/05/2018   11:41 AM 10/12/2020   11:59 AM 10/12/2020    6:04 PM 07/06/2021    6:13 AM  [place] in the past year? No Yes     Was there an injury with Fall?  Yes     Patient Fall Risk Level   High fall risk High fall risk High fall risk     Vitals:   06/11/22 1122  BP: (!) 170/94  Pulse: (!) 56  Resp: 16  Temp: (!) 96.9 F (36.1 C)  SpO2: 95%  Weight: 108 lb 9.6 oz (49.3 kg)  Height: 5' (1.524 m)   Body mass index is 21.21 kg/m.  Physical Exam     Labs reviewed: Recent Labs    04/14/22 0000  NA 139  K 4.4  CL 103  CO2 30*  BUN 30*  CREATININE 0.7  CALCIUM 8.8   No results for input(s): "AST", "ALT", "ALKPHOS", "  BILITOT", "PROT", "ALBUMIN" in the last 8760 hours. No results for input(s): "WBC", "NEUTROABS", "HGB", "HCT", "MCV", "PLT" in the last 8760 hours. Lab Results  Component Value Date   TSH 1.80 04/14/2022   No results found for: "HGBA1C" No results found for: "CHOL", "HDL", "LDLCALC", "LDLDIRECT", "TRIG", "CHOLHDL"  Significant Diagnostic Results in last 30 days:  No results found.  Assessment/Plan ***   Family/ staff Communication: Discussed plan of care with resident and charge nurse  Labs/tests ordered:     Durenda Age, DNP, MSN, FNP-BC [facility] [phone] (Monday-Friday 8:00 a.m. - 5:00 p.m.) [phone] (after hours)

## 2022-07-07 ENCOUNTER — Other Ambulatory Visit: Payer: Self-pay | Admitting: Orthopedic Surgery

## 2022-07-07 DIAGNOSIS — Z515 Encounter for palliative care: Secondary | ICD-10-CM

## 2022-07-07 DIAGNOSIS — R627 Adult failure to thrive: Secondary | ICD-10-CM

## 2022-07-07 MED ORDER — MORPHINE SULFATE (CONCENTRATE) 20 MG/ML PO SOLN: 5.0000 mg | ORAL | 0 refills | Status: DC | PRN

## 2022-07-07 MED ORDER — MORPHINE SULFATE (CONCENTRATE) 20 MG/ML PO SOLN: 5.0000 mg | Freq: Three times a day (TID) | ORAL | 0 refills | Status: DC

## 2022-07-07 NOTE — Progress Notes (Signed)
Evaluated by Eunice Blase RN with Hospice recommending to start roxanol.

## 2022-07-09 ENCOUNTER — Encounter: Payer: Self-pay | Admitting: Nurse Practitioner

## 2022-07-09 ENCOUNTER — Non-Acute Institutional Stay (SKILLED_NURSING_FACILITY): Payer: Medicare PPO | Admitting: Nurse Practitioner

## 2022-07-09 DIAGNOSIS — J449 Chronic obstructive pulmonary disease, unspecified: Secondary | ICD-10-CM

## 2022-07-09 DIAGNOSIS — R627 Adult failure to thrive: Secondary | ICD-10-CM | POA: Diagnosis not present

## 2022-07-09 DIAGNOSIS — R413 Other amnesia: Secondary | ICD-10-CM | POA: Diagnosis not present

## 2022-07-09 NOTE — Progress Notes (Unsigned)
Location:  [place] Room Number: NO/28/A Place of Service:  SNF (31) Provider:  Alexas Basulto X, NP  [name], MD  Patient Care Team: [name], MD as PCP - General (Internal Medicine) [place] Jeanett Antonopoulos X, NP as Nurse Practitioner (Nurse Practitioner)  Extended Emergency Contact Information Primary Emergency Contact: [name] [place] of [place] Phone: [phone] Relation: Son Secondary Emergency Contact: [place] Phone: [phone] Mobile Phone: [phone] Relation: Daughter  Code Status:  DNR Goals of care: Advanced Directive information    07/09/2022   10:02 AM  Advanced Directives  Does Patient Have a Medical Advance Directive? Yes  Type of Paramedic of [place];Living will;Out of facility DNR (pink MOST or yellow form)  Does patient want to make changes to medical advance directive? No - Patient declined  Copy of [place] in Chart? Yes - validated most recent copy scanned in chart (See row information)  Pre-existing out of facility DNR order (yellow form or pink MOST form) Pink MOST form placed in chart (order not valid for inpatient use);Yellow form placed in chart (order not valid for inpatient use)     Chief Complaint  Patient presents with   Acute Visit    Patient is being seen for shortness of breath    HPI:  Pt is a 88 y.o. female seen today for an acute visit for reported SOB, the patient was seen resting in bed in supine position, appears comfortable w/o facial grimaces, O2 2lpm via [place] in place, Sat O2 90%,  her goal of care if comfort measures, under Hospice service. Atropine gtt SL helped in reducing secretion. Morphine and DuoNeb to help respiratory symptoms.   FTT, persists, supportive care in SNF  Edema trace in ankles  Memory deficits: progressing, total care.        Past Medical History:   Diagnosis Date   Abnormal liver function tests    11/11/15 AST 31, ALT 88, alk phos 96    Acute kidney failure, unspecified ([place]) 01/24/2012   Arthralgia 09/26/2014   Multiple joints: knees, shoulders, wrists, spine hips    Arthritis    Candidiasis of other urogenital sites 08/10/2012   Cerebral embolism 05/23/2005   CHF (congestive heart failure) ([place])    Cholelithiasis 11/04/2015   Closed fracture of sacrum and coccyx without mention of spinal cord injury 02/14/2012   Constipation 05/03/2013   Contusion of face, scalp, and neck except eye(s) 06/01/2012   Contusion of wrist 06/01/2012   COPD (chronic obstructive pulmonary disease) (H[facility])    COPD, mild ([place]) 01/20/2013   Edema 04/20/2012   H/O: CVA (cerebrovascular accident)    Hearing loss 09/26/2014   History of cancer of uterus    HTN (hypertension), benign    Hyperglycemia 11/14/2015   Hypothyroidism    Insomnia, unspecified 08/10/2012   Open wound of knee, leg (except thigh), and ankle, without mention of complication 0/06/6150   Other and unspecified hyperlipidemia 06/01/2012   Other disorder of coccyx 01/27/2012   Pain in joint, ankle and foot 06/14/2012   Peripheral vascular disease, unspecified ([place]) 01/27/2012   Personal history of fall 01/27/2012   Pneumonia, organism unspecified([place]) 01/23/2005   Rheumatic fever 09/26/1933   Age 5 10/01/14 ESR 13, RAF <10    Seasonal allergies 05/03/2013   Stasis dermatitis of both legs 09/26/2014   Unspecified constipation 11/02/2012   Unspecified hearing loss 02/08/2013   Unspecified hereditary  and idiopathic peripheral neuropathy 01/27/2012   Urinary frequency 02/24/2014   Ventricular fibrillation ([place]) 01/24/2012   Past Surgical History:  Procedure Laterality Date   ABDOMINAL HYSTERECTOMY  1990   for endometrial cancer   CATARACT EXTRACTION W/ INTRAOCULAR LENS  IMPLANT, BILATERAL     ECTOPIC PREGNANCY SURGERY  1952   GUM SURGERY  1932   MASTOIDECTOMY  1920   bilateral   THYROIDECTOMY  1960    TONSILLECTOMY  1916    Allergies  Allergen Reactions   Amoxicillin     Unknown, patient unable to answer questionnaire    Aspirin Other (See Comments)    On MAR   Avelox [Moxifloxacin]     Unknown: listed on MAR   Erythromycin     Unknown: listed on MAR   Monistat [Miconazole]     Unknown: listed on MAR   Morphine And Related     Unknown: listed on MAR   Orange Juice [Orange Oil]     Unknown: listed on MAR    Outpatient Encounter Medications as of 07/09/2022  Medication Sig   carboxymethylcellulose (REFRESH PLUS) 0.5 % SOLN Place 1 drop into both eyes 2 (two) times daily.   Eyelid Cleansers (OCUSOFT LID SCRUB ALLERGY) PADS Place 1 each into both eyes in the morning and at bedtime. Cleanse bilateral lids for excess eye secretions.   ipratropium-albuterol (DUONEB) 0.5-2.5 (3) MG/3ML SOLN Inhale 3 mLs into the lungs daily. Administer (1) vial via inhalation for COPD   morphine (ROXANOL) 20 MG/ML concentrated solution Take 0.25 mLs (5 mg total) by mouth 3 (three) times daily.   morphine (ROXANOL) 20 MG/ML concentrated solution Take 0.25 mLs (5 mg total) by mouth every 2 (two) hours as needed for severe pain.   traMADol (ULTRAM) 50 MG tablet Take 50 mg by mouth every 8 (eight) hours as needed (Pain).   No facility-administered encounter medications on file as of 07/09/2022.    Review of Systems  Unable to perform ROS: Acuity of condition    Immunization History  Administered Date(s) Administered   Influenza Whole 10/19/2012, 09/15/2018   Influenza, High Dose Seasonal PF 09/14/2019   Influenza-Unspecified 10/18/2013, 10/17/2014, 09/02/2015, 09/29/2016, 10/03/2017, 09/23/2020, 09/30/2021   Moderna Sars-Covid-2 Vaccination 12/15/2019, 01/12/2020, 10/21/2020, 05/12/2021, 09/01/2021   PPD Test 01/22/2013, 11/28/2015   Pneumococcal Conjugate-13 11/18/2017   Pneumococcal Polysaccharide-23 01/22/2013   Td 02/21/2009   Tdap 04/07/2015   Pertinent  Health Maintenance Due  Topic  Date Due   INFLUENZA VACCINE  07/13/2022      09/02/2017    1:42 PM 09/05/2018   11:41 AM 10/12/2020   11:59 AM 10/12/2020    6:04 PM 07/06/2021    6:13 AM  [place] in the past year? No Yes     Was there an injury with Fall?  Yes     Patient Fall Risk Level   High fall risk High fall risk High fall risk   Functional Status Survey:    Vitals:   07/09/22 1003  BP: (!) 146/66  Pulse: 73  Resp: 20  Temp: (!) 96.6 F (35.9 C)  SpO2: 90%  Weight: 105 lb (47.6 kg)  Height: 5' (1.524 m)   Body mass index is 20.51 kg/m. Physical Exam Vitals and nursing note reviewed.  Constitutional:      Comments: Sleepy.  HENT:     Head: Normocephalic and atraumatic.     Nose: Nose normal.     Mouth/Throat:     Mouth: Mucous membranes  are dry.  Eyes:     Extraocular Movements: Extraocular movements intact.     Pupils: Pupils are equal, round, and reactive to light.  Cardiovascular:     Rate and Rhythm: Normal rate and regular rhythm.     Heart sounds: Murmur heard.  Pulmonary:     Breath sounds: No rales.     Comments: O2 dependent, central congestion.  Abdominal:     General: Bowel sounds are normal.     Palpations: Abdomen is soft.     Tenderness: There is no abdominal tenderness.  Musculoskeletal:     Cervical back: Normal range of motion and neck supple.     Right lower leg: Edema present.     Left lower leg: Edema present.     Comments: Trace edema in ankles.   Skin:    General: Skin is warm and dry.  Neurological:     General: No focal deficit present.     Mental Status: She is alert. Mental status is at baseline.  Psychiatric:        Mood and Affect: Mood normal.     Labs reviewed: Recent Labs    04/14/22 0000  NA 139  K 4.4  CL 103  CO2 30*  BUN 30*  CREATININE 0.7  CALCIUM 8.8   No results for input(s): "AST", "ALT", "ALKPHOS", "BILITOT", "PROT", "ALBUMIN" in the last 8760 hours. No results for input(s): "WBC", "NEUTROABS", "HGB", "HCT", "MCV",  "PLT" in the last 8760 hours. Lab Results  Component Value Date   TSH 1.80 04/14/2022   No results found for: "HGBA1C" No results found for: "CHOL", "HDL", "LDLCALC", "LDLDIRECT", "TRIG", "CHOLHDL"  Significant Diagnostic Results in last 30 days:  No results found.  Assessment/Plan COPD, mild ([place])  reported SOB, the patient was seen resting in bed in supine position, appears comfortable w/o facial grimaces, O2 2lpm via [place] in place, Sat O2 90%,  her goal of care if comfort measures, under Hospice service. Atropine gtt SL helped in reducing secretion. Morphine and DuoNeb to help respiratory symptoms.   Adult failure to thrive persists, supportive care in SNF  Memory deficits Progressing, total care.      Family/ staff Communication: plan of care reviewed with the patient and charge nurse.   Labs/tests ordered:  none  Time spend 30 minutes.

## 2022-07-11 ENCOUNTER — Encounter: Payer: Self-pay | Admitting: Nurse Practitioner

## 2022-07-11 NOTE — Assessment & Plan Note (Signed)
reported SOB, the patient was seen resting in bed in supine position, appears comfortable w/o facial grimaces, O2 2lpm via [place] in place, Sat O2 90%,  her goal of care if comfort measures, under Hospice service. Atropine gtt SL helped in reducing secretion. Morphine and DuoNeb to help respiratory symptoms.

## 2022-07-11 NOTE — Assessment & Plan Note (Signed)
persists, supportive care in SNF

## 2022-07-11 NOTE — Assessment & Plan Note (Addendum)
Progressing, total care.

## 2022-08-13 DEATH — deceased
